# Patient Record
Sex: Male | Born: 1935
Health system: Southern US, Community
[De-identification: ages and names within clinical notes are randomized; demographics above are authoritative.]

## PROBLEM LIST (undated history)

## (undated) DIAGNOSIS — I480 Paroxysmal atrial fibrillation: Secondary | ICD-10-CM

## (undated) DIAGNOSIS — C801 Malignant (primary) neoplasm, unspecified: Secondary | ICD-10-CM

## (undated) DIAGNOSIS — I739 Peripheral vascular disease, unspecified: Secondary | ICD-10-CM

## (undated) DIAGNOSIS — I10 Essential (primary) hypertension: Secondary | ICD-10-CM

## (undated) DIAGNOSIS — R0602 Shortness of breath: Secondary | ICD-10-CM

## (undated) DIAGNOSIS — J189 Pneumonia, unspecified organism: Secondary | ICD-10-CM

## (undated) DIAGNOSIS — N200 Calculus of kidney: Secondary | ICD-10-CM

## (undated) DIAGNOSIS — C61 Malignant neoplasm of prostate: Secondary | ICD-10-CM

## (undated) DIAGNOSIS — Z978 Presence of other specified devices: Secondary | ICD-10-CM

## (undated) HISTORY — PX: JOINT REPLACEMENT: SHX530

## (undated) HISTORY — PX: COLONOSCOPY: SHX174

---

## 2001-04-25 ENCOUNTER — Emergency Department (HOSPITAL_COMMUNITY): Admission: EM | Admit: 2001-04-25 | Discharge: 2001-04-25 | Payer: Self-pay | Admitting: Emergency Medicine

## 2001-04-25 ENCOUNTER — Encounter: Payer: Self-pay | Admitting: Emergency Medicine

## 2001-06-23 ENCOUNTER — Other Ambulatory Visit: Admission: RE | Admit: 2001-06-23 | Discharge: 2001-06-23 | Payer: Self-pay | Admitting: Urology

## 2001-07-10 ENCOUNTER — Encounter: Payer: Self-pay | Admitting: Urology

## 2001-07-10 ENCOUNTER — Ambulatory Visit (HOSPITAL_COMMUNITY): Admission: RE | Admit: 2001-07-10 | Discharge: 2001-07-10 | Payer: Self-pay | Admitting: Urology

## 2001-07-12 ENCOUNTER — Encounter: Payer: Self-pay | Admitting: Urology

## 2001-07-12 ENCOUNTER — Ambulatory Visit (HOSPITAL_COMMUNITY): Admission: RE | Admit: 2001-07-12 | Discharge: 2001-07-12 | Payer: Self-pay | Admitting: Urology

## 2001-07-20 ENCOUNTER — Ambulatory Visit: Admission: RE | Admit: 2001-07-20 | Discharge: 2001-10-18 | Payer: Self-pay | Admitting: Radiation Oncology

## 2001-07-21 ENCOUNTER — Encounter: Payer: Self-pay | Admitting: Emergency Medicine

## 2001-07-22 ENCOUNTER — Inpatient Hospital Stay (HOSPITAL_COMMUNITY): Admission: EM | Admit: 2001-07-22 | Discharge: 2001-07-23 | Payer: Self-pay | Admitting: Emergency Medicine

## 2004-04-19 ENCOUNTER — Emergency Department (HOSPITAL_COMMUNITY): Admission: EM | Admit: 2004-04-19 | Discharge: 2004-04-19 | Payer: Self-pay | Admitting: Emergency Medicine

## 2005-02-11 ENCOUNTER — Ambulatory Visit: Payer: Self-pay | Admitting: Orthopedic Surgery

## 2005-02-16 ENCOUNTER — Encounter (HOSPITAL_COMMUNITY): Admission: RE | Admit: 2005-02-16 | Discharge: 2005-03-18 | Payer: Self-pay | Admitting: Orthopedic Surgery

## 2005-02-24 ENCOUNTER — Ambulatory Visit: Payer: Self-pay | Admitting: Orthopedic Surgery

## 2005-05-26 ENCOUNTER — Ambulatory Visit: Payer: Self-pay | Admitting: Orthopedic Surgery

## 2005-12-19 ENCOUNTER — Inpatient Hospital Stay (HOSPITAL_COMMUNITY): Admission: EM | Admit: 2005-12-19 | Discharge: 2005-12-19 | Payer: Self-pay | Admitting: Emergency Medicine

## 2005-12-21 ENCOUNTER — Ambulatory Visit (HOSPITAL_COMMUNITY): Admission: RE | Admit: 2005-12-21 | Discharge: 2005-12-21 | Payer: Self-pay | Admitting: Internal Medicine

## 2006-01-05 ENCOUNTER — Emergency Department (HOSPITAL_COMMUNITY): Admission: EM | Admit: 2006-01-05 | Discharge: 2006-01-05 | Payer: Self-pay | Admitting: Emergency Medicine

## 2007-01-26 ENCOUNTER — Ambulatory Visit (HOSPITAL_COMMUNITY): Admission: RE | Admit: 2007-01-26 | Discharge: 2007-01-26 | Payer: Self-pay | Admitting: Family Medicine

## 2007-09-11 ENCOUNTER — Ambulatory Visit (HOSPITAL_COMMUNITY): Admission: RE | Admit: 2007-09-11 | Discharge: 2007-09-11 | Payer: Self-pay | Admitting: Family Medicine

## 2010-01-16 ENCOUNTER — Ambulatory Visit (HOSPITAL_COMMUNITY): Admission: RE | Admit: 2010-01-16 | Discharge: 2010-01-16 | Payer: Self-pay | Admitting: Internal Medicine

## 2010-01-19 ENCOUNTER — Emergency Department (HOSPITAL_COMMUNITY)
Admission: EM | Admit: 2010-01-19 | Discharge: 2010-01-19 | Payer: Self-pay | Source: Home / Self Care | Admitting: Emergency Medicine

## 2010-07-01 LAB — URINE CULTURE

## 2010-07-01 LAB — URINALYSIS, ROUTINE W REFLEX MICROSCOPIC
Glucose, UA: NEGATIVE mg/dL
Nitrite: NEGATIVE
Specific Gravity, Urine: 1.025 (ref 1.005–1.030)
pH: 6 (ref 5.0–8.0)

## 2010-07-01 LAB — URINE MICROSCOPIC-ADD ON

## 2010-09-04 NOTE — Discharge Summary (Signed)
[facility]  Patient:    [name], [name] Visit Number: [id number] MRN: [id number]          Service Type: MED Location: 3A A319 01 Attending Physician:  Syliva Overman Dictated by:   Syliva Overman, M.D. Admit Date:  07/21/2001 Discharge Date: 07/23/2001                             Discharge Summary  DISCHARGE DIAGNOSES: 1. Acute urinary tract infection. 2. Hypertension. 3. Hyperlipidemia. 4. Prostate cancer.  SUMMARY:  The patient is an 77 year old African-American male who presented with a 2-day history of shaking chills and fever, accompanied by emesis. He also gave a 2-day history of dysuria, frequency, and malodorous urine.  HISTORY OF PRESENT ILLNESS:  The patient was diagnosed with prostate cancer approximately 3 weeks prior to his admission. He was referred to Dr. Dayton Scrape for radiation therapy, and he reported that three days prior to this admission, he had an test done at [facility] which involved introduction of dye per the urethra. He reports that the symptoms started shortly after this as stated above. He did call Dr Rito Ehrlich who recommended Levaquin by mouth over the telephone; however, the patient was unable to keep the Levaquin down secondary to emesis, and he presented to the emergency room with shaking chills and a fever of 102.5.  PAST MEDICAL HISTORY:  Positive for hypertension x3 years, hyperlipidemia x2 years, obesity, and symptoms of prostate enlargement.  SOCIAL HISTORY:  Status post right hip replacement in 1993.  MEDICATION ON ADMISSION: 1. Norvasc 5 mg daily. 2. Lipitor 20 mg at bedtime, which the patient takes intermittently. 3. Flomax 0.4 mg daily as needed.  ALLERGIES:  Stated as to PENICILLIN and probably CODEINE.  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 He is a father of two sons and one daughter, ages ranging from 2 to  64.  FAMILY HISTORY:  Parents:  Mother was deceased in her 63s, known to die asthmatic but exact cause of death not known; Father died in his 88s of unknown cause. He is an only child.  PHYSICAL EXAMINATION:  GENERAL:  At the time when I saw him, he was an elderly gentleman lying flat in bed in no obvious cardiopulmonary distress.  VITAL SIGNS:  His temperature at that time was 98.3 with a heart rate of 92, respirations 20, blood pressure 130/72.  HEENT:  Extraocular muscles intact. Oropharynx moist.  NECK:  Supple. No JVD, no bruits.  CHEST:  Adequate air entry bilaterally, no crackles or wheezes were heard.  CARDIOVASCULAR:  Heart sounds S1 & S2, no murmurs, no S3.  ABDOMEN:  Soft, nontender, no palpable organomegaly or masses, no renal angle tenderness.  EXTREMITIES:  Negative for edema or ulcers.  ADMISSION LABORATORY DATA:  Urinalysis showed nitrite positive cloudy urine with a small amount of leukocyte esterase, and many bacteria. Complete Blood Count:  White cell count elevated to 12.8 with 90% granulocytes, hemoglobin 13.6, platelets 175,000. Chemistries:  Sodium was 138, potassium 3.8, chloride 105, CO2 26, BUN 11, creatinine 1.1, glucose 129. Hepatic panel normal.  HOSPITAL COURSE:  The patient was admitted to the medical floor. His blood cultures x2 and urine cultures were sent from the emergency room. He also received his first dose of IV Levaquin in the emergency room. Subsequently, he has had two further doses of IV Levaquin,  the initial two 500 mg, the last 750 mg. The patients T-max during his hospitalization was 99.4. His appetite was good, and he had absolutely no episodes of emesis. His hospital course was uncomplicated. He made steady progress, and on the day of his discharge his white cell count is 7.8 with a hemoglobin of 12.4, platelet count 160, neutrophils 72% with 19% lymphocytes. The blood culture report for the initial 2 days is negative for any  signs of bacterial infection. This will be followed up closely in the next five days, and his urine culture is not yet available. He will be discharged home on a 1-week course of Levaquin 500 mg one to be taken daily as well as his other chronic medications which include Norvasc 5 mg daily, Lipitor 20 mg at bedtime, and Flomax 0.4 mg one daily as needed. His lipid panel was checked while in hospital, and this shows a total cholesterol of 192 with a bad cholesterol of 124, triglycerides 64, and HDL 53.  DISPOSITION:  The patients condition at discharge is stable.  FOLLOWUP:  He will follow up with Dr. Lodema Hong on July 27, 2001 at 10:30 a.m.  DISCHARGE MEDICATIONS: 1. Levaquin 500 mg daily for a week. 2. Norvasc 5 mg daily. 3. Lipitor 20 mg at bedtime. 4. Flomax 0.4 daily as needed. Dictated by:   Syliva Overman, M.D. Attending Physician:  Syliva Overman DD:  07/23/01 TD:  07/23/01 Job: [id number]

## 2010-09-04 NOTE — H&P (Signed)
[facility]  Patient:    CHICO, CAWOOD Visit Number: [id number] MRN: [id number]          Service Type: MED Location: 3A A213 01 Attending Physician:  Hilario Quarry Dictated by:   Syliva Overman, M.D. Admit Date:  07/21/2001                           History and Physical  HISTORY OF PRESENT ILLNESS:  In summary, Mr. Ritzel is a 77 year old African American male who presents with a two-day history of shaking chills and fever. The patient also reports a two-day history of malodorous urine with dysuria; this was one day following a study involving dye placement per his urethra. The patient was diagnosed with prostate cancer approximately three weeks ago and has been having repeated testing being done for radiation therapy. Dictated by:   Syliva Overman, M.D. Attending Physician:  Hilario Quarry DD:  07/22/01 TD:  07/22/01 Job: [id number]

## 2010-09-04 NOTE — Discharge Summary (Signed)
NAMEIVIN, ROSENBLOOM NO.:  [id number]   MEDICAL RECORD NO.:  [id number]          PATIENT TYPE:  INP   LOCATION:  A302                          FACILITY:  [facility]   PHYSICIAN:  Madelin Rear. Sherwood Gambler, MD  DATE OF BIRTH:  [birth year]   DATE OF ADMISSION:  12/19/2005  DATE OF DISCHARGE:  09/02/2007LH                                 DISCHARGE SUMMARY   DISCHARGE DIAGNOSES:  1. Constipation, resolved.  2. Hypertension, stable.   HOSPITAL COURSE AND PLAN:  The patient was admitted for about an hour or two  after attempts in the emergency department apparently were unsuccessful in  relieving his constipation.  However, on my morning rounds the patient  stated he was asymptomatic and had two good bowel movements.  His abdomen  was soft, nontender.  Outpatient CT scan to be arranged.  Over the counter  stool softener, Colace, as well as Lactulose was prescribed.      Madelin Rear. Sherwood Gambler, MD  Electronically Signed     LJF/MEDQ  D:  12/19/2005  T:  12/20/2005  Job:  [id number]

## 2010-09-04 NOTE — H&P (Signed)
[facility]  Patient:    [name], [name] Visit Number: [id number] MRN: [id number]          Service Type: MED Location: 3A Y782 01 Attending Physician:  Hilario Quarry Dictated by:   Syliva Overman, M.D. Admit Date:  07/21/2001                           History and Physical  CHIEF COMPLAINT:  Mr. Delucia is a 77 year old African American male who presents with a two-day history of shaking chills and fever accompanied by emesis.  The patient also gives a two-day history of dysuria, frequency and malodorous urine.  HISTORY OF THE PRESENT ILLNESS:  Mr. Borgwardt was diagnosed with prostate cancer approximately three weeks prior to this admission.  He was referred for radiation treatment and has been subsequently seen by Dr. Maryln Gottron in [place].  He reports that three days prior to his admission, he had a test done at [facility] which involved introduction of dye per the urethra; following this, his symptoms, as stated above, developed.  The patient did call Dr. Dennie Maizes, reporting shaking chills as well as fever and he had been started on oral Levaquin.  He reports, however, that he did not keep the antibiotic down.  He subsequently had significant emesis and then he presented to the emergency room several hours later, febrile and with clear evidence of a urinary tract infection.  At the time he presented to the emergency room, his temperature was 102.5.  PAST MEDICAL HISTORY:  Significant for hypertension for about three years, hyperlipidemia for approximately two years, obesity and symptoms of prostate enlargement.  SURGICAL HISTORY:  He is status post right hip replacement in 1993.  MEDICATIONS: 1. Norvasc 5 mg daily. 2. Lipitor 20 mg at bedtime, which the patient reports he does not take as he    should, since he thought it was harming him. 3. Flomax 0.4 mg daily as needed.  ALLERGIES:  Allergies stated are to PENICILLIN and  PROBABLY CODEINE.  SOCIAL HISTORY:  He has been married for 52 years.  He is the father of two sons and a daughter, ages ranging from 10 to 11.  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   FAMILY HISTORY:  Parents:  Mother was deceased in her 29s; she was known to be asthmatic but her exact cause of death is not clear.  Father also died in his 52s, unknown cause.  He is an only child.  REVIEW OF SYSTEMS:  CARDIOVASCULAR SYSTEM:  The patient denies any chest pain, palpitations and becomes dyspneic only with excessive exertion.  RESPIRATORY SYSTEM:  He denies any sinus congestion, productive cough, sore throat or significant allergy symptoms.  GASTROINTESTINAL SYSTEM:  Appetite is reportedly good.  His bowel movement are regular.  There is no obvious rectal bleeding, black stool or change in bowel habits.  He states he had a colonoscopy approximately five years ago.  CNS:  He denies headaches or seizure activity or any localizing weakness.  PHYSICAL EXAMINATION:  GENERAL:  At the time I saw him on the floor, the patient was an elderly gentleman lying flat in bed in no obvious cardiopulmonary distress.  VITAL SIGNS:  His temperature was 98.3, pulse 92, respirations 20, blood pressure 130/72.  HEENT/NECK:  Neck supple.  Extraocular muscles intact.  Oropharynx moist.  No JVD.  No bruits.  CHEST:  Exam revealed adequate air  entry bilaterally, no crackles or wheezes heard.  CARDIOVASCULAR:  Heart sounds 1 and 2.  No murmurs or S3.  ABDOMEN:  Obese, soft, nontender.  No palpable organomegaly or masses.  EXTREMITIES:  Exam negative for edema or ulcers.  LABORATORY AND ACCESSORY DATA:  Urinalysis:  Clean-catch urine was cloudy, which was nitrite-positive.  Small amount of leukocyte esterase and many bacteria were seen.  CBC:  White cell count was elevated at 12.8 with 90% granulocytes.  Hemoglobin was 13.6 and platelets 175,000.  Chemistries: Sodium 138, potassium 3.8,  chloride 105, CO2 26, BUN 11, creatinine 1.1 and glucose 129.  Hepatic panel was within normal.  IMPRESSION: 1. The patient has acute urinary tract infection with question of sepsis    immediately following an per-urethral procedure.  He will be admitted,    placed on intravenous antibiotic -- Levaquin is antibiotic of choice in    this case -- and urine and blood cultures x2 have been sent off.  Tylenol    will be used for control of any fever and he will have a low-sodium diet as    tolerated. 2. Hypertension, adequately controlled on current medication.  Continue    Norvasc 5 mg daily. 3. Hyperlipidemia.  Lipitor 20 mg at bedtime.  Fasting lipid panel to be    checked in the morning. 4. Dr. Rito Ehrlich notified of admission and will see the patient also.Dictated by:   Syliva Overman, M.D. Attending Physician:  Hilario Quarry DD:  07/22/01 TD:  07/22/01 Job: [id number]

## 2010-09-04 NOTE — H&P (Signed)
[name], [name] NO.:  [id number]   MEDICAL RECORD NO.:  [id number]          PATIENT TYPE:  INP   LOCATION:  A302                          FACILITY:  [facility]   PHYSICIAN:  Madelin Rear. Sherwood Gambler, MD  DATE OF BIRTH:  1928/06/01   DATE OF ADMISSION:  12/19/2005  DATE OF DISCHARGE:  09/02/2007LH                                HISTORY & PHYSICAL   CHIEF COMPLAINT:  Constipation.   HISTORY OF PRESENT ILLNESS:  The patient had several days of lack of bowel  movement and abdominal discomfort and cramping.  Self-administer over-the-  counter laxatives were ineffective at home according to the patient.   PAST MEDICAL HISTORY:  1. Prostate CA managed by urology.  2. Hypertension maintained on hydrochlorothiazide.   SOCIAL HISTORY:  Nonsmoker, nondrinker.  No other drug use.   FAMILY HISTORY:  Noncontributory.   REVIEW OF SYSTEMS:  Under HPI.  No hematemesis, hematochezia or melena.   PHYSICAL EXAMINATION:  SKIN:  Unremarkable.  HEAD AND NECK:  No JVD or adenopathy.  Neck supple.  CHEST:  Clear.  CARDIAC EXAM:  Regular rhythm.  No gallop or rub.  ABDOMEN:  Soft.  No organomegaly or masses.  No guarding or rebound  tenderness.  No tympanitic percussion.   LABORATORIES:  Not obtained by ER.   IMPRESSION:  Constipation unresponsive to enema in emergency department and  over-the-counter oral laxatives.  The patient was admitted however after  Dulcolax and a second enema, he had a good bowel movement and felt back to  normal in the morning.  A separate discharge summary will be dictated  however, the patient was discharged on Colace 100 mg p.o. t.i.d., lactulose  p.r.n. and follow up in our office, outpatient CT scan to make sure he has  no partial bowel obstructions.     Madelin Rear. Sherwood Gambler, MD  Electronically Signed    LJF/MEDQ  D:  12/19/2005  T:  12/19/2005  Job:  [id number]

## 2010-12-09 ENCOUNTER — Ambulatory Visit (HOSPITAL_COMMUNITY)
Admission: RE | Admit: 2010-12-09 | Discharge: 2010-12-09 | Disposition: A | Discharge status: Home / Self Care | Payer: Medicare Other | Source: Ambulatory Visit | Attending: Family Medicine | Admitting: Family Medicine

## 2010-12-09 ENCOUNTER — Other Ambulatory Visit (HOSPITAL_COMMUNITY): Payer: Self-pay | Admitting: Family Medicine

## 2010-12-09 DIAGNOSIS — R55 Syncope and collapse: Secondary | ICD-10-CM

## 2010-12-09 DIAGNOSIS — Z8546 Personal history of malignant neoplasm of prostate: Secondary | ICD-10-CM | POA: Insufficient documentation

## 2010-12-14 ENCOUNTER — Ambulatory Visit (HOSPITAL_COMMUNITY)
Admission: RE | Admit: 2010-12-14 | Discharge: 2010-12-14 | Disposition: A | Discharge status: Home / Self Care | Payer: Medicare Other | Source: Ambulatory Visit | Attending: Family Medicine | Admitting: Family Medicine

## 2010-12-14 DIAGNOSIS — I658 Occlusion and stenosis of other precerebral arteries: Secondary | ICD-10-CM | POA: Insufficient documentation

## 2010-12-14 DIAGNOSIS — R55 Syncope and collapse: Secondary | ICD-10-CM | POA: Insufficient documentation

## 2010-12-14 DIAGNOSIS — I6529 Occlusion and stenosis of unspecified carotid artery: Secondary | ICD-10-CM | POA: Insufficient documentation

## 2011-05-03 DIAGNOSIS — H4011X0 Primary open-angle glaucoma, stage unspecified: Secondary | ICD-10-CM | POA: Diagnosis not present

## 2011-05-03 DIAGNOSIS — H2510 Age-related nuclear cataract, unspecified eye: Secondary | ICD-10-CM | POA: Diagnosis not present

## 2011-05-20 DIAGNOSIS — R05 Cough: Secondary | ICD-10-CM | POA: Diagnosis not present

## 2011-05-20 DIAGNOSIS — D400 Neoplasm of uncertain behavior of prostate: Secondary | ICD-10-CM | POA: Diagnosis not present

## 2011-05-20 DIAGNOSIS — J069 Acute upper respiratory infection, unspecified: Secondary | ICD-10-CM | POA: Diagnosis not present

## 2011-05-20 DIAGNOSIS — I10 Essential (primary) hypertension: Secondary | ICD-10-CM | POA: Diagnosis not present

## 2011-05-20 DIAGNOSIS — Z6838 Body mass index (BMI) 38.0-38.9, adult: Secondary | ICD-10-CM | POA: Diagnosis not present

## 2011-06-01 DIAGNOSIS — C61 Malignant neoplasm of prostate: Secondary | ICD-10-CM | POA: Diagnosis not present

## 2011-06-15 DIAGNOSIS — H4011X0 Primary open-angle glaucoma, stage unspecified: Secondary | ICD-10-CM | POA: Diagnosis not present

## 2011-06-28 DIAGNOSIS — H4011X0 Primary open-angle glaucoma, stage unspecified: Secondary | ICD-10-CM | POA: Diagnosis not present

## 2011-06-28 DIAGNOSIS — H2510 Age-related nuclear cataract, unspecified eye: Secondary | ICD-10-CM | POA: Diagnosis not present

## 2011-08-26 DIAGNOSIS — C61 Malignant neoplasm of prostate: Secondary | ICD-10-CM | POA: Diagnosis not present

## 2011-09-02 DIAGNOSIS — C61 Malignant neoplasm of prostate: Secondary | ICD-10-CM | POA: Diagnosis not present

## 2011-11-01 DIAGNOSIS — H4011X0 Primary open-angle glaucoma, stage unspecified: Secondary | ICD-10-CM | POA: Diagnosis not present

## 2011-11-01 DIAGNOSIS — H409 Unspecified glaucoma: Secondary | ICD-10-CM | POA: Diagnosis not present

## 2011-11-01 DIAGNOSIS — H2510 Age-related nuclear cataract, unspecified eye: Secondary | ICD-10-CM | POA: Diagnosis not present

## 2011-12-15 DIAGNOSIS — C61 Malignant neoplasm of prostate: Secondary | ICD-10-CM | POA: Diagnosis not present

## 2012-01-21 DIAGNOSIS — I10 Essential (primary) hypertension: Secondary | ICD-10-CM | POA: Diagnosis not present

## 2012-01-21 DIAGNOSIS — N390 Urinary tract infection, site not specified: Secondary | ICD-10-CM | POA: Diagnosis not present

## 2012-01-21 DIAGNOSIS — Z23 Encounter for immunization: Secondary | ICD-10-CM | POA: Diagnosis not present

## 2012-01-21 DIAGNOSIS — Z0000 Encounter for general adult medical examination without abnormal findings: Secondary | ICD-10-CM | POA: Diagnosis not present

## 2012-03-27 DIAGNOSIS — H2510 Age-related nuclear cataract, unspecified eye: Secondary | ICD-10-CM | POA: Diagnosis not present

## 2012-03-27 DIAGNOSIS — H4011X0 Primary open-angle glaucoma, stage unspecified: Secondary | ICD-10-CM | POA: Diagnosis not present

## 2012-07-31 DIAGNOSIS — N390 Urinary tract infection, site not specified: Secondary | ICD-10-CM | POA: Diagnosis not present

## 2012-07-31 DIAGNOSIS — C61 Malignant neoplasm of prostate: Secondary | ICD-10-CM | POA: Diagnosis not present

## 2012-10-16 DIAGNOSIS — H4011X0 Primary open-angle glaucoma, stage unspecified: Secondary | ICD-10-CM | POA: Diagnosis not present

## 2012-10-16 DIAGNOSIS — H2510 Age-related nuclear cataract, unspecified eye: Secondary | ICD-10-CM | POA: Diagnosis not present

## 2012-10-16 DIAGNOSIS — H409 Unspecified glaucoma: Secondary | ICD-10-CM | POA: Diagnosis not present

## 2012-11-08 DIAGNOSIS — R972 Elevated prostate specific antigen [PSA]: Secondary | ICD-10-CM | POA: Diagnosis not present

## 2012-11-08 DIAGNOSIS — C61 Malignant neoplasm of prostate: Secondary | ICD-10-CM | POA: Diagnosis not present

## 2013-01-09 DIAGNOSIS — H2510 Age-related nuclear cataract, unspecified eye: Secondary | ICD-10-CM | POA: Diagnosis not present

## 2013-01-09 DIAGNOSIS — H4011X0 Primary open-angle glaucoma, stage unspecified: Secondary | ICD-10-CM | POA: Diagnosis not present

## 2013-01-09 DIAGNOSIS — H409 Unspecified glaucoma: Secondary | ICD-10-CM | POA: Diagnosis not present

## 2013-01-12 DIAGNOSIS — Z681 Body mass index (BMI) 19 or less, adult: Secondary | ICD-10-CM | POA: Diagnosis not present

## 2013-01-12 DIAGNOSIS — N390 Urinary tract infection, site not specified: Secondary | ICD-10-CM | POA: Diagnosis not present

## 2013-01-12 DIAGNOSIS — Z23 Encounter for immunization: Secondary | ICD-10-CM | POA: Diagnosis not present

## 2013-01-12 DIAGNOSIS — R3919 Other difficulties with micturition: Secondary | ICD-10-CM | POA: Diagnosis not present

## 2013-01-29 ENCOUNTER — Encounter (HOSPITAL_COMMUNITY): Payer: Self-pay | Admitting: Pharmacy Technician

## 2013-02-06 NOTE — Patient Instructions (Addendum)
Your procedure is scheduled on: 02/13/2013  Report to [facility] at 0830 AM.  Call this number if you have problems the morning of surgery: [phone]   Do not eat food or drink liquids :After Midnight.      Take these medicines the morning of surgery with A SIP OF WATER: lotrel, hydrodiuril, flomax   Do not wear jewelry, make-up or nail polish.  Do not wear lotions, powders, or perfumes.   Do not shave 48 hours prior to surgery.  Do not bring valuables to the hospital.  Contacts, dentures or bridgework may not be worn into surgery.  Leave suitcase in the car. After surgery it may be brought to your room.  For patients admitted to the hospital, checkout time is 11:00 AM the day of discharge.   Patients discharged the day of surgery will not be allowed to drive home.  :     Please read over the following fact sheets that you were given: Coughing and Deep Breathing, Surgical Site Infection Prevention, Anesthesia Post-op Instructions and Care and Recovery After Surgery    Cataract A cataract is a clouding of the lens of the eye. When a lens becomes cloudy, vision is reduced based on the degree and nature of the clouding. Many cataracts reduce vision to some degree. Some cataracts make people more near-sighted as they develop. Other cataracts increase glare. Cataracts that are ignored and become worse can sometimes look white. The white color can be seen through the pupil. CAUSES   Aging. However, cataracts may occur at any age, even in newborns.   Certain drugs.   Trauma to the eye.   Certain diseases such as diabetes.   Specific eye diseases such as chronic inflammation inside the eye or a sudden attack of a rare form of glaucoma.   Inherited or acquired medical problems.  SYMPTOMS   Gradual, progressive drop in vision in the affected eye.   Severe, rapid visual loss. This most often happens when trauma is the cause.  DIAGNOSIS  To detect a cataract, an eye doctor examines  the lens. Cataracts are best diagnosed with an exam of the eyes with the pupils enlarged (dilated) by drops.  TREATMENT  For an early cataract, vision may improve by using different eyeglasses or stronger lighting. If that does not help your vision, surgery is the only effective treatment. A cataract needs to be surgically removed when vision loss interferes with your everyday activities, such as driving, reading, or watching TV. A cataract may also have to be removed if it prevents examination or treatment of another eye problem. Surgery removes the cloudy lens and usually replaces it with a substitute lens (intraocular lens, IOL).  At a time when both you and your doctor agree, the cataract will be surgically removed. If you have cataracts in both eyes, only one is usually removed at a time. This allows the operated eye to heal and be out of danger from any possible problems after surgery (such as infection or poor wound healing). In rare cases, a cataract may be doing damage to your eye. In these cases, your caregiver may advise surgical removal right away. The vast majority of people who have cataract surgery have better vision afterward. HOME CARE INSTRUCTIONS  If you are not planning surgery, you may be asked to do the following:  Use different eyeglasses.   Use stronger or brighter lighting.   Ask your eye doctor about reducing your medicine dose or changing medicines if  it is thought that a medicine caused your cataract. Changing medicines does not make the cataract go away on its own.   Become familiar with your surroundings. Poor vision can lead to injury. Avoid bumping into things on the affected side. You are at a higher risk for tripping or falling.   Exercise extreme care when driving or operating machinery.   Wear sunglasses if you are sensitive to bright light or experiencing problems with glare.  SEEK IMMEDIATE MEDICAL CARE IF:   You have a worsening or sudden vision loss.    You notice redness, swelling, or increasing pain in the eye.   You have a fever.  Document Released: 04/05/2005 Document Revised: 03/25/2011 Document Reviewed: 11/27/2010 [facility] Patient Information 2012 [place], [place].PATIENT INSTRUCTIONS POST-ANESTHESIA  IMMEDIATELY FOLLOWING SURGERY:  Do not drive or operate machinery for the first twenty four hours after surgery.  Do not make any important decisions for twenty four hours after surgery or while taking narcotic pain medications or sedatives.  If you develop intractable nausea and vomiting or a severe headache please notify your doctor immediately.  FOLLOW-UP:  Please make an appointment with your surgeon as instructed. You do not need to follow up with anesthesia unless specifically instructed to do so.  WOUND CARE INSTRUCTIONS (if applicable):  Keep a dry clean dressing on the anesthesia/puncture wound site if there is drainage.  Once the wound has quit draining you may leave it open to air.  Generally you should leave the bandage intact for twenty four hours unless there is drainage.  If the epidural site drains for more than 36-48 hours please call the anesthesia department.  QUESTIONS?:  Please feel free to call your physician or the hospital operator if you have any questions, and they will be happy to assist you.

## 2013-02-07 ENCOUNTER — Other Ambulatory Visit: Payer: Self-pay

## 2013-02-07 ENCOUNTER — Encounter (HOSPITAL_COMMUNITY): Payer: Self-pay

## 2013-02-07 ENCOUNTER — Encounter (HOSPITAL_COMMUNITY): Payer: Self-pay | Admitting: Pharmacy Technician

## 2013-02-07 ENCOUNTER — Encounter (HOSPITAL_COMMUNITY)
Admission: RE | Admit: 2013-02-07 | Discharge: 2013-02-07 | Disposition: A | Discharge status: Home / Self Care | Payer: Medicare Other | Source: Ambulatory Visit | Attending: Ophthalmology | Admitting: Ophthalmology

## 2013-02-07 DIAGNOSIS — Z01818 Encounter for other preprocedural examination: Secondary | ICD-10-CM | POA: Diagnosis not present

## 2013-02-07 DIAGNOSIS — Z01810 Encounter for preprocedural cardiovascular examination: Secondary | ICD-10-CM | POA: Insufficient documentation

## 2013-02-07 DIAGNOSIS — Z01812 Encounter for preprocedural laboratory examination: Secondary | ICD-10-CM | POA: Diagnosis not present

## 2013-02-07 HISTORY — DX: Pneumonia, unspecified organism: J18.9

## 2013-02-07 HISTORY — DX: Shortness of breath: R06.02

## 2013-02-07 HISTORY — DX: Calculus of kidney: N20.0

## 2013-02-07 HISTORY — DX: Essential (primary) hypertension: I10

## 2013-02-07 LAB — HEMOGLOBIN AND HEMATOCRIT, BLOOD
HCT: 40.3 % (ref 39.0–52.0)
Hemoglobin: 13.2 g/dL (ref 13.0–17.0)

## 2013-02-07 LAB — BASIC METABOLIC PANEL
BUN: 16 mg/dL (ref 6–23)
Chloride: 100 mEq/L (ref 96–112)
Creatinine, Ser: 0.9 mg/dL (ref 0.50–1.35)
GFR calc Af Amer: 89 mL/min — ABNORMAL LOW (ref >90)
GFR calc non Af Amer: 77 mL/min — ABNORMAL LOW (ref >90)
Potassium: 4 mEq/L (ref 3.5–5.1)

## 2013-02-12 ENCOUNTER — Encounter (HOSPITAL_COMMUNITY): Payer: Self-pay | Admitting: Pharmacy Technician

## 2013-02-12 MED ORDER — TETRACAINE HCL 0.5 % OP SOLN
OPHTHALMIC | Status: AC
  Filled 2013-02-12: qty 2

## 2013-02-12 MED ORDER — PHENYLEPHRINE HCL 2.5 % OP SOLN
OPHTHALMIC | Status: AC
  Filled 2013-02-12: qty 15

## 2013-02-12 MED ORDER — KETOROLAC TROMETHAMINE 0.5 % OP SOLN
OPHTHALMIC | Status: AC
  Filled 2013-02-12: qty 5

## 2013-02-12 MED ORDER — CYCLOPENTOLATE-PHENYLEPHRINE OP SOLN OPTIME - NO CHARGE
OPHTHALMIC | Status: AC
  Filled 2013-02-12: qty 2

## 2013-02-13 ENCOUNTER — Encounter (HOSPITAL_COMMUNITY): Payer: Self-pay | Admitting: Anesthesiology

## 2013-02-13 ENCOUNTER — Encounter (HOSPITAL_COMMUNITY)
Admission: RE | Disposition: A | Discharge status: Home / Self Care | Payer: Self-pay | Source: Ambulatory Visit | Attending: Ophthalmology

## 2013-02-13 ENCOUNTER — Encounter (HOSPITAL_COMMUNITY): Payer: Medicare Other | Admitting: Anesthesiology

## 2013-02-13 ENCOUNTER — Ambulatory Visit (HOSPITAL_COMMUNITY): Payer: Medicare Other | Admitting: Anesthesiology

## 2013-02-13 ENCOUNTER — Ambulatory Visit (HOSPITAL_COMMUNITY)
Admission: RE | Admit: 2013-02-13 | Discharge: 2013-02-13 | Disposition: A | Discharge status: Home / Self Care | Payer: Medicare Other | Source: Ambulatory Visit | Attending: Ophthalmology | Admitting: Ophthalmology

## 2013-02-13 DIAGNOSIS — H2510 Age-related nuclear cataract, unspecified eye: Secondary | ICD-10-CM | POA: Diagnosis not present

## 2013-02-13 DIAGNOSIS — H269 Unspecified cataract: Secondary | ICD-10-CM | POA: Diagnosis not present

## 2013-02-13 DIAGNOSIS — I10 Essential (primary) hypertension: Secondary | ICD-10-CM | POA: Insufficient documentation

## 2013-02-13 HISTORY — PX: CATARACT EXTRACTION W/PHACO: SHX586

## 2013-02-13 SURGERY — PHACOEMULSIFICATION, CATARACT, WITH IOL INSERTION
Anesthesia: Monitor Anesthesia Care | Site: Eye | Laterality: Right | Wound class: Clean

## 2013-02-13 MED ORDER — BSS IO SOLN
INTRAOCULAR | Status: DC | PRN
  Administered 2013-02-13: 15 mL via INTRAOCULAR

## 2013-02-13 MED ORDER — LIDOCAINE HCL (PF) 1 % IJ SOLN
INTRAMUSCULAR | Status: AC
  Filled 2013-02-13: qty 2

## 2013-02-13 MED ORDER — LIDOCAINE HCL (PF) 1 % IJ SOLN
INTRAMUSCULAR | Status: DC | PRN
  Administered 2013-02-13: 1 mL

## 2013-02-13 MED ORDER — MIDAZOLAM HCL 2 MG/2ML IJ SOLN
1.0000 mg | INTRAMUSCULAR | Status: DC | PRN
  Administered 2013-02-13: 2 mg via INTRAVENOUS

## 2013-02-13 MED ORDER — PROVISC 10 MG/ML IO SOLN
INTRAOCULAR | Status: DC | PRN
  Administered 2013-02-13: 8.5 mg via INTRAOCULAR

## 2013-02-13 MED ORDER — MIDAZOLAM HCL 2 MG/2ML IJ SOLN
INTRAMUSCULAR | Status: AC
  Filled 2013-02-13: qty 2

## 2013-02-13 MED ORDER — EPINEPHRINE HCL 1 MG/ML IJ SOLN
INTRAOCULAR | Status: DC | PRN
  Administered 2013-02-13: 10:00:00

## 2013-02-13 MED ORDER — EPINEPHRINE HCL 1 MG/ML IJ SOLN
INTRAMUSCULAR | Status: AC
  Filled 2013-02-13: qty 1

## 2013-02-13 MED ORDER — PHENYLEPHRINE HCL 2.5 % OP SOLN
1.0000 [drp] | OPHTHALMIC | Status: AC
  Administered 2013-02-13 (×3): 1 [drp] via OPHTHALMIC

## 2013-02-13 MED ORDER — CYCLOPENTOLATE-PHENYLEPHRINE 0.2-1 % OP SOLN
1.0000 [drp] | OPHTHALMIC | Status: AC
Start: 2013-02-13 — End: 2013-02-13
  Administered 2013-02-13 (×3): 1 [drp] via OPHTHALMIC

## 2013-02-13 MED ORDER — KETOROLAC TROMETHAMINE 0.5 % OP SOLN
1.0000 [drp] | OPHTHALMIC | Status: AC
  Administered 2013-02-13 (×3): 1 [drp] via OPHTHALMIC

## 2013-02-13 MED ORDER — LACTATED RINGERS IV SOLN
INTRAVENOUS | Status: DC
  Administered 2013-02-13: 09:00:00 via INTRAVENOUS

## 2013-02-13 MED ORDER — TETRACAINE HCL 0.5 % OP SOLN
1.0000 [drp] | OPHTHALMIC | Status: AC
  Administered 2013-02-13 (×3): 1 [drp] via OPHTHALMIC

## 2013-02-13 SURGICAL SUPPLY — 10 items
CLOTH BEACON ORANGE TIMEOUT ST (SAFETY) ×1 IMPLANT
EYE SHIELD UNIVERSAL CLEAR (GAUZE/BANDAGES/DRESSINGS) ×1 IMPLANT
GLOVE BIO SURGEON STRL SZ 6.5 (GLOVE) ×1 IMPLANT
GLOVE EXAM NITRILE LRG STRL (GLOVE) ×1 IMPLANT
PAD ARMBOARD 7.5X6 YLW CONV (MISCELLANEOUS) ×1 IMPLANT
RING MALYGIN (MISCELLANEOUS) ×1 IMPLANT
SIGHTPATH CAT PROC W REG LENS (Ophthalmic Related) ×2 IMPLANT
TAPE SURG TRANSPORE 1 IN (GAUZE/BANDAGES/DRESSINGS) IMPLANT
TAPE SURGICAL TRANSPORE 1 IN (GAUZE/BANDAGES/DRESSINGS) ×1
WATER STERILE IRR 250ML POUR (IV SOLUTION) ×1 IMPLANT

## 2013-02-13 NOTE — Transfer of Care (Signed)
Immediate Anesthesia Transfer of Care Note  Patient: [name]  Procedure(s) Performed: Procedure(s): CATARACT EXTRACTION RIGHT EYE (WITH PHACO) AND INTRAOCULAR LENS PLACEMENT  CDE=14.53 (Right)  Patient Location: Short Stay  Anesthesia Type:MAC  Level of Consciousness: awake  Airway & Oxygen Therapy: Patient Spontanous Breathing  Post-op Assessment: Report given to PACU RN  Post vital signs: Reviewed  Complications: No apparent anesthesia complications

## 2013-02-13 NOTE — Op Note (Signed)
Patient brought to the operating room and prepped and draped in the usual manner.  Lid speculum inserted in right eye.  Stab incision made at the twelve o'clock position.  Provisc instilled in the anterior chamber.   A 2.4 mm. Stab incision was made temporally. Intraocular Xylocaine was instilled.  Due to a small pupil, a Malugyn Ring was inserted.  An anterior capsulotomy was done with a bent 25 gauge needle.  The nucleus was hydrodissected.  The Phaco tip was inserted in the anterior chamber and the nucleus was emulsified.  CDE was 14.53.  The cortical material was then removed with the I and A tip.  Posterior capsule was the polished.  The anterior chamber was deepened with Provisc.  A 22.0 Diopter Rayner 570C IOL was then inserted in the capsular bag. The Malugyn Ring was removed.   Provisc was then removed with the I and A tip.  The wound was then hydrated.  Patient sent to the Recovery Room in good condition with follow up in my office.  Preoperative Diagnosis:  Nuclear Cataract OD Postoperative Diagnosis:  Same Procedure name: Kelman Phacoemulsification OD with IOL

## 2013-02-13 NOTE — H&P (Signed)
The patient was re examined and there is no change in the patients condition since the original H and P. 

## 2013-02-13 NOTE — Anesthesia Postprocedure Evaluation (Signed)
  Anesthesia Post-op Note  Patient: [name]  Procedure(s) Performed: Procedure(s): CATARACT EXTRACTION RIGHT EYE (WITH PHACO) AND INTRAOCULAR LENS PLACEMENT  CDE=14.53 (Right)  Patient Location: Short Stay  Anesthesia Type:MAC  Level of Consciousness: awake, alert  and oriented  Airway and Oxygen Therapy: Patient Spontanous Breathing  Post-op Pain: none  Post-op Assessment: Post-op Vital signs reviewed, Patient's Cardiovascular Status Stable, Respiratory Function Stable, Patent Airway and No signs of Nausea or vomiting  Post-op Vital Signs: Reviewed and stable  Complications: No apparent anesthesia complications

## 2013-02-13 NOTE — Anesthesia Preprocedure Evaluation (Signed)
Anesthesia Evaluation  Patient identified by MRN, date of birth, ID band Patient awake    Reviewed: Allergy & Precautions, H&P , NPO status , Patient's Chart, lab work & pertinent test results  Airway Mallampati: II TM Distance: >3 FB     Dental  (+) Teeth Intact   Pulmonary shortness of breath and with exertion, pneumonia -, resolved,  breath sounds clear to auscultation        Cardiovascular hypertension, Pt. on medications Rhythm:Regular Rate:Normal     Neuro/Psych    GI/Hepatic negative GI ROS,   Endo/Other    Renal/GU      Musculoskeletal   Abdominal   Peds  Hematology   Anesthesia Other Findings   Reproductive/Obstetrics                           Anesthesia Physical Anesthesia Plan  ASA: II  Anesthesia Plan: MAC   Post-op Pain Management:    Induction: Intravenous  Airway Management Planned: Nasal Cannula  Additional Equipment:   Intra-op Plan:   Post-operative Plan:   Informed Consent: I have reviewed the patients History and Physical, chart, labs and discussed the procedure including the risks, benefits and alternatives for the proposed anesthesia with the patient or authorized representative who has indicated his/her understanding and acceptance.     Plan Discussed with:   Anesthesia Plan Comments:         Anesthesia Quick Evaluation

## 2013-02-14 ENCOUNTER — Encounter (HOSPITAL_COMMUNITY): Payer: Self-pay | Admitting: Ophthalmology

## 2013-02-20 ENCOUNTER — Encounter (HOSPITAL_COMMUNITY)
Admission: RE | Admit: 2013-02-20 | Discharge: 2013-02-20 | Disposition: A | Discharge status: Home / Self Care | Payer: Medicare Other | Source: Ambulatory Visit | Attending: Ophthalmology | Admitting: Ophthalmology

## 2013-02-20 ENCOUNTER — Encounter (HOSPITAL_COMMUNITY): Payer: Self-pay

## 2013-02-22 DIAGNOSIS — C61 Malignant neoplasm of prostate: Secondary | ICD-10-CM | POA: Diagnosis not present

## 2013-02-26 MED ORDER — CYCLOPENTOLATE-PHENYLEPHRINE OP SOLN OPTIME - NO CHARGE
OPHTHALMIC | Status: AC
  Filled 2013-02-26: qty 2

## 2013-02-26 MED ORDER — KETOROLAC TROMETHAMINE 0.5 % OP SOLN
OPHTHALMIC | Status: AC
  Filled 2013-02-26: qty 5

## 2013-02-26 MED ORDER — TETRACAINE HCL 0.5 % OP SOLN
OPHTHALMIC | Status: AC
  Filled 2013-02-26: qty 2

## 2013-02-27 ENCOUNTER — Encounter (HOSPITAL_COMMUNITY): Payer: Self-pay | Admitting: *Deleted

## 2013-02-27 ENCOUNTER — Ambulatory Visit (HOSPITAL_COMMUNITY)
Admission: RE | Admit: 2013-02-27 | Discharge: 2013-02-27 | Disposition: A | Discharge status: Home / Self Care | Payer: Medicare Other | Source: Ambulatory Visit | Attending: Ophthalmology | Admitting: Ophthalmology

## 2013-02-27 ENCOUNTER — Ambulatory Visit (HOSPITAL_COMMUNITY): Payer: Medicare Other | Admitting: Anesthesiology

## 2013-02-27 ENCOUNTER — Encounter (HOSPITAL_COMMUNITY): Payer: Medicare Other | Admitting: Anesthesiology

## 2013-02-27 ENCOUNTER — Encounter (HOSPITAL_COMMUNITY)
Admission: RE | Disposition: A | Discharge status: Home / Self Care | Payer: Self-pay | Source: Ambulatory Visit | Attending: Ophthalmology

## 2013-02-27 DIAGNOSIS — H269 Unspecified cataract: Secondary | ICD-10-CM | POA: Diagnosis not present

## 2013-02-27 DIAGNOSIS — I10 Essential (primary) hypertension: Secondary | ICD-10-CM | POA: Diagnosis not present

## 2013-02-27 DIAGNOSIS — H2510 Age-related nuclear cataract, unspecified eye: Secondary | ICD-10-CM | POA: Insufficient documentation

## 2013-02-27 HISTORY — PX: CATARACT EXTRACTION W/PHACO: SHX586

## 2013-02-27 SURGERY — PHACOEMULSIFICATION, CATARACT, WITH IOL INSERTION
Anesthesia: Monitor Anesthesia Care | Site: Eye | Laterality: Left | Wound class: Clean

## 2013-02-27 MED ORDER — BSS IO SOLN
INTRAOCULAR | Status: DC | PRN
  Administered 2013-02-27: 15 mL via INTRAOCULAR

## 2013-02-27 MED ORDER — FENTANYL CITRATE 0.05 MG/ML IJ SOLN
25.0000 ug | INTRAMUSCULAR | Status: AC
  Administered 2013-02-27: 25 ug via INTRAVENOUS
  Filled 2013-02-27: qty 2

## 2013-02-27 MED ORDER — PHENYLEPHRINE HCL 2.5 % OP SOLN
OPHTHALMIC | Status: AC
  Filled 2013-02-27: qty 15

## 2013-02-27 MED ORDER — KETOROLAC TROMETHAMINE 0.5 % OP SOLN
1.0000 [drp] | OPHTHALMIC | Status: AC
  Administered 2013-02-27 (×3): 1 [drp] via OPHTHALMIC

## 2013-02-27 MED ORDER — PROVISC 10 MG/ML IO SOLN
INTRAOCULAR | Status: DC | PRN
  Administered 2013-02-27: 0.85 mL via INTRAOCULAR

## 2013-02-27 MED ORDER — PHENYLEPHRINE HCL 2.5 % OP SOLN
1.0000 [drp] | OPHTHALMIC | Status: AC
  Administered 2013-02-27 (×3): 1 [drp] via OPHTHALMIC

## 2013-02-27 MED ORDER — EPINEPHRINE HCL 1 MG/ML IJ SOLN
INTRAMUSCULAR | Status: AC
  Filled 2013-02-27: qty 1

## 2013-02-27 MED ORDER — CYCLOPENTOLATE-PHENYLEPHRINE 0.2-1 % OP SOLN
1.0000 [drp] | OPHTHALMIC | Status: AC
  Administered 2013-02-27 (×3): 1 [drp] via OPHTHALMIC

## 2013-02-27 MED ORDER — FENTANYL CITRATE 0.05 MG/ML IJ SOLN: 25.0000 ug | INTRAMUSCULAR | Status: DC | PRN

## 2013-02-27 MED ORDER — LIDOCAINE HCL (PF) 1 % IJ SOLN
INTRAMUSCULAR | Status: AC
  Filled 2013-02-27: qty 2

## 2013-02-27 MED ORDER — EPINEPHRINE HCL 1 MG/ML IJ SOLN
INTRAOCULAR | Status: DC | PRN
  Administered 2013-02-27: 08:00:00

## 2013-02-27 MED ORDER — MIDAZOLAM HCL 2 MG/2ML IJ SOLN
1.0000 mg | INTRAMUSCULAR | Status: DC | PRN
  Administered 2013-02-27 (×2): 2 mg via INTRAVENOUS
  Filled 2013-02-27 (×2): qty 2

## 2013-02-27 MED ORDER — LACTATED RINGERS IV SOLN
INTRAVENOUS | Status: DC
  Administered 2013-02-27: 07:00:00 via INTRAVENOUS

## 2013-02-27 MED ORDER — ONDANSETRON HCL 4 MG/2ML IJ SOLN: 4.0000 mg | Freq: Once | INTRAMUSCULAR | Status: DC | PRN

## 2013-02-27 MED ORDER — LIDOCAINE HCL (PF) 1 % IJ SOLN
INTRAMUSCULAR | Status: DC | PRN
  Administered 2013-02-27: 1.5 mL

## 2013-02-27 MED ORDER — TETRACAINE HCL 0.5 % OP SOLN
1.0000 [drp] | OPHTHALMIC | Status: AC
  Administered 2013-02-27 (×3): 1 [drp] via OPHTHALMIC

## 2013-02-27 SURGICAL SUPPLY — 23 items
CAPSULAR TENSION RING-AMO (OPHTHALMIC RELATED) IMPLANT
CLOTH BEACON ORANGE TIMEOUT ST (SAFETY) ×1 IMPLANT
EYE SHIELD UNIVERSAL CLEAR (GAUZE/BANDAGES/DRESSINGS) ×1 IMPLANT
GLOVE BIO SURGEON STRL SZ 6.5 (GLOVE) ×1 IMPLANT
GLOVE ECLIPSE 6.5 STRL STRAW (GLOVE) IMPLANT
GLOVE ECLIPSE 7.0 STRL STRAW (GLOVE) IMPLANT
GLOVE EXAM NITRILE LRG STRL (GLOVE) IMPLANT
GLOVE EXAM NITRILE MD LF STRL (GLOVE) ×1 IMPLANT
GLOVE SKINSENSE NS SZ6.5 (GLOVE)
GLOVE SKINSENSE STRL SZ6.5 (GLOVE) IMPLANT
HEALON 5 0.6 ML (INTRAOCULAR LENS) IMPLANT
KIT VITRECTOMY (OPHTHALMIC RELATED) IMPLANT
PAD ARMBOARD 7.5X6 YLW CONV (MISCELLANEOUS) ×1 IMPLANT
PROC W NO LENS (INTRAOCULAR LENS)
PROC W SPEC LENS (INTRAOCULAR LENS)
PROCESS W NO LENS (INTRAOCULAR LENS) IMPLANT
PROCESS W SPEC LENS (INTRAOCULAR LENS) IMPLANT
RING MALYGIN (MISCELLANEOUS) ×1 IMPLANT
SIGHTPATH CAT PROC W REG LENS (Ophthalmic Related) ×2 IMPLANT
TAPE SURG TRANSPORE 1 IN (GAUZE/BANDAGES/DRESSINGS) IMPLANT
TAPE SURGICAL TRANSPORE 1 IN (GAUZE/BANDAGES/DRESSINGS) ×1
VISCOELASTIC ADDITIONAL (OPHTHALMIC RELATED) IMPLANT
WATER STERILE IRR 250ML POUR (IV SOLUTION) ×1 IMPLANT

## 2013-02-27 NOTE — Anesthesia Postprocedure Evaluation (Signed)
  Anesthesia Post-op Note  Patient: [name]  Procedure(s) Performed: Procedure(s) with comments: CATARACT EXTRACTION PHACO AND INTRAOCULAR LENS PLACEMENT (IOC) (Left) - CDE:12.54  Patient Location: Short Stay  Anesthesia Type:MAC  Level of Consciousness: awake, alert  and oriented  Airway and Oxygen Therapy: Patient Spontanous Breathing  Post-op Pain: none  Post-op Assessment: Post-op Vital signs reviewed, Patient's Cardiovascular Status Stable, Respiratory Function Stable, Patent Airway and No signs of Nausea or vomiting  Post-op Vital Signs: Reviewed and stable  Complications: No apparent anesthesia complications

## 2013-02-27 NOTE — H&P (Signed)
The patient was re examined and there is no change in the patients condition since the original H and P. 

## 2013-02-27 NOTE — Op Note (Signed)
Patient brought to the operating room and prepped and draped in the usual manner.  Lid speculum inserted in right eye.  Stab incision made at the twelve o'clock position. Intraocular Xylocaine instilled. Provisc instilled in the anterior chamber.   A 2.4 mm. Stab incision was made temporally. Due to a small pupil, a Malugyn Ring was inserted.  An anterior capsulotomy was done with a bent 25 gauge needle.  The nucleus was hydrodissected.  The Phaco tip was inserted in the anterior chamber and the nucleus was emulsified.  CDE was 12.54.  The cortical material was then removed with the I and A tip.  Posterior capsule was the polished.  The anterior chamber was deepened with Provisc.  A 21.5 Diopter Rayner 570C IOL was then inserted in the capsular bag.  The Malugyn ring was removed.  Provisc was then removed with the I and A tip.  The wound was then hydrated.  Patient sent to the Recovery Room in good condition with follow up in my office.  Preoperative Diagnosis:  Nuclear Cataract OS Postoperative Diagnosis:  Same Procedure name: Kelman Phacoemulsification OS with IOL

## 2013-02-27 NOTE — Anesthesia Preprocedure Evaluation (Signed)
Anesthesia Evaluation  Patient identified by MRN, date of birth, ID band Patient awake    Reviewed: Allergy & Precautions, H&P , NPO status , Patient's Chart, lab work & pertinent test results  Airway Mallampati: II TM Distance: >3 FB     Dental  (+) Teeth Intact   Pulmonary shortness of breath and with exertion, pneumonia -, resolved, former smoker,  breath sounds clear to auscultation        Cardiovascular hypertension, Pt. on medications Rhythm:Regular Rate:Normal     Neuro/Psych    GI/Hepatic negative GI ROS,   Endo/Other    Renal/GU      Musculoskeletal   Abdominal   Peds  Hematology   Anesthesia Other Findings   Reproductive/Obstetrics                           Anesthesia Physical Anesthesia Plan  ASA: II  Anesthesia Plan: MAC   Post-op Pain Management:    Induction: Intravenous  Airway Management Planned: Nasal Cannula  Additional Equipment:   Intra-op Plan:   Post-operative Plan:   Informed Consent: I have reviewed the patients History and Physical, chart, labs and discussed the procedure including the risks, benefits and alternatives for the proposed anesthesia with the patient or authorized representative who has indicated his/her understanding and acceptance.     Plan Discussed with:   Anesthesia Plan Comments:         Anesthesia Quick Evaluation

## 2013-02-27 NOTE — Transfer of Care (Signed)
Immediate Anesthesia Transfer of Care Note  Patient: [name]  Procedure(s) Performed: Procedure(s) with comments: CATARACT EXTRACTION PHACO AND INTRAOCULAR LENS PLACEMENT (IOC) (Left) - CDE:12.54  Patient Location: Short Stay  Anesthesia Type:MAC  Level of Consciousness: awake  Airway & Oxygen Therapy: Patient Spontanous Breathing  Post-op Assessment: Report given to PACU RN  Post vital signs: Reviewed  Complications: No apparent anesthesia complications

## 2013-02-28 ENCOUNTER — Encounter (HOSPITAL_COMMUNITY): Payer: Self-pay | Admitting: Ophthalmology

## 2013-06-11 DIAGNOSIS — C61 Malignant neoplasm of prostate: Secondary | ICD-10-CM | POA: Diagnosis not present

## 2013-06-18 DIAGNOSIS — H4011X0 Primary open-angle glaucoma, stage unspecified: Secondary | ICD-10-CM | POA: Diagnosis not present

## 2013-06-18 DIAGNOSIS — H35379 Puckering of macula, unspecified eye: Secondary | ICD-10-CM | POA: Diagnosis not present

## 2013-06-18 DIAGNOSIS — Z961 Presence of intraocular lens: Secondary | ICD-10-CM | POA: Diagnosis not present

## 2013-06-18 DIAGNOSIS — H409 Unspecified glaucoma: Secondary | ICD-10-CM | POA: Diagnosis not present

## 2013-06-26 DIAGNOSIS — Z961 Presence of intraocular lens: Secondary | ICD-10-CM | POA: Diagnosis not present

## 2013-08-03 DIAGNOSIS — Z6836 Body mass index (BMI) 36.0-36.9, adult: Secondary | ICD-10-CM | POA: Diagnosis not present

## 2013-08-03 DIAGNOSIS — Z79899 Other long term (current) drug therapy: Secondary | ICD-10-CM | POA: Diagnosis not present

## 2013-08-03 DIAGNOSIS — Z0000 Encounter for general adult medical examination without abnormal findings: Secondary | ICD-10-CM | POA: Diagnosis not present

## 2013-10-09 DIAGNOSIS — H00029 Hordeolum internum unspecified eye, unspecified eyelid: Secondary | ICD-10-CM | POA: Diagnosis not present

## 2013-10-23 DIAGNOSIS — H0019 Chalazion unspecified eye, unspecified eyelid: Secondary | ICD-10-CM | POA: Diagnosis not present

## 2013-10-24 DIAGNOSIS — C61 Malignant neoplasm of prostate: Secondary | ICD-10-CM | POA: Diagnosis not present

## 2014-01-10 DIAGNOSIS — Z23 Encounter for immunization: Secondary | ICD-10-CM | POA: Diagnosis not present

## 2014-01-24 DIAGNOSIS — H4011X1 Primary open-angle glaucoma, mild stage: Secondary | ICD-10-CM | POA: Diagnosis not present

## 2014-02-06 DIAGNOSIS — C61 Malignant neoplasm of prostate: Secondary | ICD-10-CM | POA: Diagnosis not present

## 2014-02-08 DIAGNOSIS — L309 Dermatitis, unspecified: Secondary | ICD-10-CM | POA: Diagnosis not present

## 2014-02-08 DIAGNOSIS — Z6835 Body mass index (BMI) 35.0-35.9, adult: Secondary | ICD-10-CM | POA: Diagnosis not present

## 2014-02-08 DIAGNOSIS — N390 Urinary tract infection, site not specified: Secondary | ICD-10-CM | POA: Diagnosis not present

## 2014-02-20 DIAGNOSIS — H4011X1 Primary open-angle glaucoma, mild stage: Secondary | ICD-10-CM | POA: Diagnosis not present

## 2014-02-20 DIAGNOSIS — Z961 Presence of intraocular lens: Secondary | ICD-10-CM | POA: Diagnosis not present

## 2014-02-20 DIAGNOSIS — H4011X2 Primary open-angle glaucoma, moderate stage: Secondary | ICD-10-CM | POA: Diagnosis not present

## 2014-02-20 DIAGNOSIS — H35371 Puckering of macula, right eye: Secondary | ICD-10-CM | POA: Diagnosis not present

## 2014-02-20 DIAGNOSIS — H52203 Unspecified astigmatism, bilateral: Secondary | ICD-10-CM | POA: Diagnosis not present

## 2014-03-22 DIAGNOSIS — E6609 Other obesity due to excess calories: Secondary | ICD-10-CM | POA: Diagnosis not present

## 2014-03-22 DIAGNOSIS — Z6835 Body mass index (BMI) 35.0-35.9, adult: Secondary | ICD-10-CM | POA: Diagnosis not present

## 2014-03-22 DIAGNOSIS — N342 Other urethritis: Secondary | ICD-10-CM | POA: Diagnosis not present

## 2014-05-27 DIAGNOSIS — E291 Testicular hypofunction: Secondary | ICD-10-CM | POA: Diagnosis not present

## 2014-05-27 DIAGNOSIS — C61 Malignant neoplasm of prostate: Secondary | ICD-10-CM | POA: Diagnosis not present

## 2014-05-28 ENCOUNTER — Other Ambulatory Visit: Payer: Self-pay | Admitting: Urology

## 2014-05-28 DIAGNOSIS — C61 Malignant neoplasm of prostate: Secondary | ICD-10-CM

## 2014-06-17 ENCOUNTER — Encounter (HOSPITAL_COMMUNITY): Payer: Self-pay

## 2014-06-17 ENCOUNTER — Encounter (HOSPITAL_COMMUNITY)
Admission: RE | Admit: 2014-06-17 | Discharge: 2014-06-17 | Disposition: A | Discharge status: Home / Self Care | Payer: Medicare Other | Source: Ambulatory Visit | Attending: Urology | Admitting: Urology

## 2014-06-17 DIAGNOSIS — Z96641 Presence of right artificial hip joint: Secondary | ICD-10-CM | POA: Diagnosis not present

## 2014-06-17 DIAGNOSIS — Z8546 Personal history of malignant neoplasm of prostate: Secondary | ICD-10-CM | POA: Diagnosis not present

## 2014-06-17 DIAGNOSIS — C61 Malignant neoplasm of prostate: Secondary | ICD-10-CM | POA: Diagnosis not present

## 2014-06-17 DIAGNOSIS — Z471 Aftercare following joint replacement surgery: Secondary | ICD-10-CM | POA: Diagnosis not present

## 2014-06-17 HISTORY — DX: Malignant (primary) neoplasm, unspecified: C80.1

## 2014-06-17 MED ORDER — TECHNETIUM TC 99M MEDRONATE IV KIT
25.0000 | PACK | Freq: Once | INTRAVENOUS | Status: AC | PRN
  Administered 2014-06-17: 25 via INTRAVENOUS

## 2014-06-21 ENCOUNTER — Ambulatory Visit (HOSPITAL_COMMUNITY)
Admission: RE | Admit: 2014-06-21 | Discharge: 2014-06-21 | Disposition: A | Discharge status: Home / Self Care | Payer: Medicare Other | Source: Ambulatory Visit | Attending: Urology | Admitting: Urology

## 2014-06-21 DIAGNOSIS — C61 Malignant neoplasm of prostate: Secondary | ICD-10-CM | POA: Diagnosis not present

## 2014-06-21 DIAGNOSIS — M810 Age-related osteoporosis without current pathological fracture: Secondary | ICD-10-CM | POA: Diagnosis not present

## 2014-07-03 DIAGNOSIS — E6609 Other obesity due to excess calories: Secondary | ICD-10-CM | POA: Diagnosis not present

## 2014-07-03 DIAGNOSIS — N342 Other urethritis: Secondary | ICD-10-CM | POA: Diagnosis not present

## 2014-07-03 DIAGNOSIS — Z6835 Body mass index (BMI) 35.0-35.9, adult: Secondary | ICD-10-CM | POA: Diagnosis not present

## 2014-07-18 DIAGNOSIS — N342 Other urethritis: Secondary | ICD-10-CM | POA: Diagnosis not present

## 2014-09-03 ENCOUNTER — Ambulatory Visit (INDEPENDENT_AMBULATORY_CARE_PROVIDER_SITE_OTHER): Payer: Medicare Other | Admitting: Urology

## 2014-09-03 DIAGNOSIS — C61 Malignant neoplasm of prostate: Secondary | ICD-10-CM

## 2014-09-03 DIAGNOSIS — E291 Testicular hypofunction: Secondary | ICD-10-CM | POA: Diagnosis not present

## 2014-09-03 DIAGNOSIS — M810 Age-related osteoporosis without current pathological fracture: Secondary | ICD-10-CM

## 2014-10-23 DIAGNOSIS — H409 Unspecified glaucoma: Secondary | ICD-10-CM | POA: Diagnosis not present

## 2014-10-23 DIAGNOSIS — H4011X2 Primary open-angle glaucoma, moderate stage: Secondary | ICD-10-CM | POA: Diagnosis not present

## 2014-10-23 DIAGNOSIS — Z961 Presence of intraocular lens: Secondary | ICD-10-CM | POA: Diagnosis not present

## 2014-10-23 DIAGNOSIS — H4011X1 Primary open-angle glaucoma, mild stage: Secondary | ICD-10-CM | POA: Diagnosis not present

## 2014-11-25 ENCOUNTER — Other Ambulatory Visit (HOSPITAL_COMMUNITY): Payer: Self-pay | Admitting: Physician Assistant

## 2014-11-25 ENCOUNTER — Ambulatory Visit (HOSPITAL_COMMUNITY)
Admission: RE | Admit: 2014-11-25 | Discharge: 2014-11-25 | Disposition: A | Discharge status: Home / Self Care | Payer: Medicare Other | Source: Ambulatory Visit | Attending: Physician Assistant | Admitting: Physician Assistant

## 2014-11-25 DIAGNOSIS — Z8546 Personal history of malignant neoplasm of prostate: Secondary | ICD-10-CM | POA: Diagnosis not present

## 2014-11-25 DIAGNOSIS — Z6835 Body mass index (BMI) 35.0-35.9, adult: Secondary | ICD-10-CM | POA: Diagnosis not present

## 2014-11-25 DIAGNOSIS — R079 Chest pain, unspecified: Secondary | ICD-10-CM

## 2014-11-25 DIAGNOSIS — Z0001 Encounter for general adult medical examination with abnormal findings: Secondary | ICD-10-CM | POA: Diagnosis not present

## 2014-11-25 DIAGNOSIS — Z1389 Encounter for screening for other disorder: Secondary | ICD-10-CM | POA: Diagnosis not present

## 2014-11-25 DIAGNOSIS — E6609 Other obesity due to excess calories: Secondary | ICD-10-CM | POA: Diagnosis not present

## 2014-11-25 DIAGNOSIS — R0602 Shortness of breath: Secondary | ICD-10-CM | POA: Insufficient documentation

## 2014-12-10 ENCOUNTER — Ambulatory Visit: Payer: Medicare Other | Admitting: Urology

## 2015-01-27 DIAGNOSIS — Z23 Encounter for immunization: Secondary | ICD-10-CM | POA: Diagnosis not present

## 2015-06-17 ENCOUNTER — Ambulatory Visit (INDEPENDENT_AMBULATORY_CARE_PROVIDER_SITE_OTHER): Payer: Medicare Other | Admitting: Urology

## 2015-06-17 DIAGNOSIS — C61 Malignant neoplasm of prostate: Secondary | ICD-10-CM | POA: Diagnosis not present

## 2015-06-20 ENCOUNTER — Other Ambulatory Visit: Payer: Self-pay | Admitting: Urology

## 2015-06-20 DIAGNOSIS — C61 Malignant neoplasm of prostate: Secondary | ICD-10-CM

## 2015-06-24 ENCOUNTER — Encounter (HOSPITAL_COMMUNITY): Payer: Self-pay

## 2015-06-24 ENCOUNTER — Encounter (HOSPITAL_COMMUNITY)
Admission: RE | Admit: 2015-06-24 | Discharge: 2015-06-24 | Disposition: A | Discharge status: Home / Self Care | Payer: Medicare Other | Source: Ambulatory Visit | Attending: Urology | Admitting: Urology

## 2015-06-24 DIAGNOSIS — C61 Malignant neoplasm of prostate: Secondary | ICD-10-CM | POA: Diagnosis not present

## 2015-06-24 MED ORDER — TECHNETIUM TC 99M MEDRONATE IV KIT
25.0000 | PACK | Freq: Once | INTRAVENOUS | Status: AC | PRN
  Administered 2015-06-24: 27 via INTRAVENOUS

## 2015-06-25 ENCOUNTER — Ambulatory Visit (HOSPITAL_COMMUNITY): Payer: Medicare Other

## 2015-07-01 ENCOUNTER — Ambulatory Visit (INDEPENDENT_AMBULATORY_CARE_PROVIDER_SITE_OTHER): Payer: Medicare Other | Admitting: Urology

## 2015-07-01 DIAGNOSIS — C61 Malignant neoplasm of prostate: Secondary | ICD-10-CM

## 2015-08-15 DIAGNOSIS — H401113 Primary open-angle glaucoma, right eye, severe stage: Secondary | ICD-10-CM | POA: Diagnosis not present

## 2015-08-18 DIAGNOSIS — Z961 Presence of intraocular lens: Secondary | ICD-10-CM | POA: Diagnosis not present

## 2015-08-18 DIAGNOSIS — H401112 Primary open-angle glaucoma, right eye, moderate stage: Secondary | ICD-10-CM | POA: Diagnosis not present

## 2015-08-18 DIAGNOSIS — H401121 Primary open-angle glaucoma, left eye, mild stage: Secondary | ICD-10-CM | POA: Diagnosis not present

## 2015-08-18 DIAGNOSIS — H35371 Puckering of macula, right eye: Secondary | ICD-10-CM | POA: Diagnosis not present

## 2015-10-14 ENCOUNTER — Ambulatory Visit (INDEPENDENT_AMBULATORY_CARE_PROVIDER_SITE_OTHER): Payer: Medicare Other | Admitting: Urology

## 2015-10-14 DIAGNOSIS — C61 Malignant neoplasm of prostate: Secondary | ICD-10-CM | POA: Diagnosis not present

## 2015-10-28 DIAGNOSIS — H26491 Other secondary cataract, right eye: Secondary | ICD-10-CM | POA: Diagnosis not present

## 2015-10-28 DIAGNOSIS — Z961 Presence of intraocular lens: Secondary | ICD-10-CM | POA: Diagnosis not present

## 2015-10-28 DIAGNOSIS — H401113 Primary open-angle glaucoma, right eye, severe stage: Secondary | ICD-10-CM | POA: Diagnosis not present

## 2015-11-04 ENCOUNTER — Ambulatory Visit (INDEPENDENT_AMBULATORY_CARE_PROVIDER_SITE_OTHER): Payer: Medicare Other | Admitting: Urology

## 2015-11-04 DIAGNOSIS — C61 Malignant neoplasm of prostate: Secondary | ICD-10-CM | POA: Diagnosis not present

## 2015-11-26 DIAGNOSIS — Z1389 Encounter for screening for other disorder: Secondary | ICD-10-CM | POA: Diagnosis not present

## 2015-11-26 DIAGNOSIS — R5383 Other fatigue: Secondary | ICD-10-CM | POA: Diagnosis not present

## 2015-11-26 DIAGNOSIS — Z0000 Encounter for general adult medical examination without abnormal findings: Secondary | ICD-10-CM | POA: Diagnosis not present

## 2015-11-26 DIAGNOSIS — Z6835 Body mass index (BMI) 35.0-35.9, adult: Secondary | ICD-10-CM | POA: Diagnosis not present

## 2015-11-26 DIAGNOSIS — Z23 Encounter for immunization: Secondary | ICD-10-CM | POA: Diagnosis not present

## 2015-11-26 DIAGNOSIS — E782 Mixed hyperlipidemia: Secondary | ICD-10-CM | POA: Diagnosis not present

## 2015-11-26 DIAGNOSIS — I10 Essential (primary) hypertension: Secondary | ICD-10-CM | POA: Diagnosis not present

## 2016-01-09 DIAGNOSIS — E6609 Other obesity due to excess calories: Secondary | ICD-10-CM | POA: Diagnosis not present

## 2016-01-09 DIAGNOSIS — N342 Other urethritis: Secondary | ICD-10-CM | POA: Diagnosis not present

## 2016-01-09 DIAGNOSIS — Z6835 Body mass index (BMI) 35.0-35.9, adult: Secondary | ICD-10-CM | POA: Diagnosis not present

## 2016-01-09 DIAGNOSIS — Z23 Encounter for immunization: Secondary | ICD-10-CM | POA: Diagnosis not present

## 2016-02-18 ENCOUNTER — Other Ambulatory Visit: Payer: Self-pay | Admitting: Urology

## 2016-02-18 DIAGNOSIS — C61 Malignant neoplasm of prostate: Secondary | ICD-10-CM

## 2016-03-02 ENCOUNTER — Ambulatory Visit (HOSPITAL_COMMUNITY)
Admission: RE | Admit: 2016-03-02 | Discharge: 2016-03-02 | Disposition: A | Discharge status: Home / Self Care | Payer: Medicare Other | Source: Ambulatory Visit | Attending: Urology | Admitting: Urology

## 2016-03-02 DIAGNOSIS — C61 Malignant neoplasm of prostate: Secondary | ICD-10-CM | POA: Diagnosis not present

## 2016-03-02 DIAGNOSIS — I700 Atherosclerosis of aorta: Secondary | ICD-10-CM | POA: Insufficient documentation

## 2016-03-02 LAB — POCT I-STAT CREATININE: Creatinine, Ser: 1 mg/dL (ref 0.61–1.24)

## 2016-03-02 MED ORDER — IOPAMIDOL (ISOVUE-300) INJECTION 61%
100.0000 mL | Freq: Once | INTRAVENOUS | Status: AC | PRN
  Administered 2016-03-02: 100 mL via INTRAVENOUS

## 2016-03-09 ENCOUNTER — Ambulatory Visit (INDEPENDENT_AMBULATORY_CARE_PROVIDER_SITE_OTHER): Payer: Medicare Other | Admitting: Urology

## 2016-03-09 DIAGNOSIS — R350 Frequency of micturition: Secondary | ICD-10-CM | POA: Diagnosis not present

## 2016-03-09 DIAGNOSIS — M810 Age-related osteoporosis without current pathological fracture: Secondary | ICD-10-CM | POA: Diagnosis not present

## 2016-03-09 DIAGNOSIS — C61 Malignant neoplasm of prostate: Secondary | ICD-10-CM | POA: Diagnosis not present

## 2016-03-09 DIAGNOSIS — E291 Testicular hypofunction: Secondary | ICD-10-CM

## 2016-03-15 DIAGNOSIS — N390 Urinary tract infection, site not specified: Secondary | ICD-10-CM | POA: Diagnosis not present

## 2016-04-21 DIAGNOSIS — H401121 Primary open-angle glaucoma, left eye, mild stage: Secondary | ICD-10-CM | POA: Diagnosis not present

## 2016-04-21 DIAGNOSIS — Z961 Presence of intraocular lens: Secondary | ICD-10-CM | POA: Diagnosis not present

## 2016-04-21 DIAGNOSIS — H401112 Primary open-angle glaucoma, right eye, moderate stage: Secondary | ICD-10-CM | POA: Diagnosis not present

## 2016-07-02 DIAGNOSIS — C61 Malignant neoplasm of prostate: Secondary | ICD-10-CM | POA: Diagnosis not present

## 2016-07-13 ENCOUNTER — Ambulatory Visit (INDEPENDENT_AMBULATORY_CARE_PROVIDER_SITE_OTHER): Payer: Medicare Other | Admitting: Urology

## 2016-07-13 DIAGNOSIS — R9721 Rising PSA following treatment for malignant neoplasm of prostate: Secondary | ICD-10-CM

## 2016-07-13 DIAGNOSIS — C61 Malignant neoplasm of prostate: Secondary | ICD-10-CM

## 2016-07-13 DIAGNOSIS — M810 Age-related osteoporosis without current pathological fracture: Secondary | ICD-10-CM | POA: Diagnosis not present

## 2016-07-13 DIAGNOSIS — E291 Testicular hypofunction: Secondary | ICD-10-CM

## 2016-08-31 DIAGNOSIS — I10 Essential (primary) hypertension: Secondary | ICD-10-CM | POA: Diagnosis not present

## 2016-08-31 DIAGNOSIS — C61 Malignant neoplasm of prostate: Secondary | ICD-10-CM | POA: Diagnosis not present

## 2016-08-31 DIAGNOSIS — H6122 Impacted cerumen, left ear: Secondary | ICD-10-CM | POA: Diagnosis not present

## 2016-08-31 DIAGNOSIS — Z1389 Encounter for screening for other disorder: Secondary | ICD-10-CM | POA: Diagnosis not present

## 2016-08-31 DIAGNOSIS — Z6835 Body mass index (BMI) 35.0-35.9, adult: Secondary | ICD-10-CM | POA: Diagnosis not present

## 2016-08-31 DIAGNOSIS — E782 Mixed hyperlipidemia: Secondary | ICD-10-CM | POA: Diagnosis not present

## 2016-09-30 DIAGNOSIS — E6609 Other obesity due to excess calories: Secondary | ICD-10-CM | POA: Diagnosis not present

## 2016-09-30 DIAGNOSIS — Z6835 Body mass index (BMI) 35.0-35.9, adult: Secondary | ICD-10-CM | POA: Diagnosis not present

## 2016-09-30 DIAGNOSIS — N342 Other urethritis: Secondary | ICD-10-CM | POA: Diagnosis not present

## 2016-09-30 DIAGNOSIS — Z1389 Encounter for screening for other disorder: Secondary | ICD-10-CM | POA: Diagnosis not present

## 2016-10-25 DIAGNOSIS — H401112 Primary open-angle glaucoma, right eye, moderate stage: Secondary | ICD-10-CM | POA: Diagnosis not present

## 2016-10-25 DIAGNOSIS — H401121 Primary open-angle glaucoma, left eye, mild stage: Secondary | ICD-10-CM | POA: Diagnosis not present

## 2016-10-25 DIAGNOSIS — H402212 Chronic angle-closure glaucoma, right eye, moderate stage: Secondary | ICD-10-CM | POA: Diagnosis not present

## 2016-11-02 DIAGNOSIS — H401133 Primary open-angle glaucoma, bilateral, severe stage: Secondary | ICD-10-CM | POA: Diagnosis not present

## 2016-11-02 DIAGNOSIS — Z961 Presence of intraocular lens: Secondary | ICD-10-CM | POA: Diagnosis not present

## 2016-11-03 DIAGNOSIS — C61 Malignant neoplasm of prostate: Secondary | ICD-10-CM | POA: Diagnosis not present

## 2016-11-16 ENCOUNTER — Ambulatory Visit (INDEPENDENT_AMBULATORY_CARE_PROVIDER_SITE_OTHER): Payer: Medicare Other | Admitting: Urology

## 2016-11-16 DIAGNOSIS — Z79899 Other long term (current) drug therapy: Secondary | ICD-10-CM | POA: Diagnosis not present

## 2016-11-16 DIAGNOSIS — R9721 Rising PSA following treatment for malignant neoplasm of prostate: Secondary | ICD-10-CM | POA: Diagnosis not present

## 2016-11-16 DIAGNOSIS — C61 Malignant neoplasm of prostate: Secondary | ICD-10-CM | POA: Diagnosis not present

## 2016-12-22 DIAGNOSIS — Z6836 Body mass index (BMI) 36.0-36.9, adult: Secondary | ICD-10-CM | POA: Diagnosis not present

## 2016-12-22 DIAGNOSIS — Z0000 Encounter for general adult medical examination without abnormal findings: Secondary | ICD-10-CM | POA: Diagnosis not present

## 2016-12-30 DIAGNOSIS — D400 Neoplasm of uncertain behavior of prostate: Secondary | ICD-10-CM | POA: Diagnosis not present

## 2016-12-30 DIAGNOSIS — E6609 Other obesity due to excess calories: Secondary | ICD-10-CM | POA: Diagnosis not present

## 2016-12-30 DIAGNOSIS — E782 Mixed hyperlipidemia: Secondary | ICD-10-CM | POA: Diagnosis not present

## 2016-12-30 DIAGNOSIS — I10 Essential (primary) hypertension: Secondary | ICD-10-CM | POA: Diagnosis not present

## 2016-12-30 DIAGNOSIS — Z23 Encounter for immunization: Secondary | ICD-10-CM | POA: Diagnosis not present

## 2016-12-30 DIAGNOSIS — Z6836 Body mass index (BMI) 36.0-36.9, adult: Secondary | ICD-10-CM | POA: Diagnosis not present

## 2017-03-17 DIAGNOSIS — C61 Malignant neoplasm of prostate: Secondary | ICD-10-CM | POA: Diagnosis not present

## 2017-03-22 ENCOUNTER — Ambulatory Visit (INDEPENDENT_AMBULATORY_CARE_PROVIDER_SITE_OTHER): Payer: Medicare Other | Admitting: Urology

## 2017-03-22 DIAGNOSIS — R9721 Rising PSA following treatment for malignant neoplasm of prostate: Secondary | ICD-10-CM

## 2017-03-22 DIAGNOSIS — Z79899 Other long term (current) drug therapy: Secondary | ICD-10-CM

## 2017-03-22 DIAGNOSIS — M810 Age-related osteoporosis without current pathological fracture: Secondary | ICD-10-CM

## 2017-03-22 DIAGNOSIS — C61 Malignant neoplasm of prostate: Secondary | ICD-10-CM | POA: Diagnosis not present

## 2017-06-10 DIAGNOSIS — H401133 Primary open-angle glaucoma, bilateral, severe stage: Secondary | ICD-10-CM | POA: Diagnosis not present

## 2017-06-22 DIAGNOSIS — H401121 Primary open-angle glaucoma, left eye, mild stage: Secondary | ICD-10-CM | POA: Diagnosis not present

## 2017-06-22 DIAGNOSIS — H401112 Primary open-angle glaucoma, right eye, moderate stage: Secondary | ICD-10-CM | POA: Diagnosis not present

## 2017-06-22 DIAGNOSIS — H402212 Chronic angle-closure glaucoma, right eye, moderate stage: Secondary | ICD-10-CM | POA: Diagnosis not present

## 2017-06-30 DIAGNOSIS — Z6836 Body mass index (BMI) 36.0-36.9, adult: Secondary | ICD-10-CM | POA: Diagnosis not present

## 2017-06-30 DIAGNOSIS — J069 Acute upper respiratory infection, unspecified: Secondary | ICD-10-CM | POA: Diagnosis not present

## 2017-06-30 DIAGNOSIS — N419 Inflammatory disease of prostate, unspecified: Secondary | ICD-10-CM | POA: Diagnosis not present

## 2017-06-30 DIAGNOSIS — E6609 Other obesity due to excess calories: Secondary | ICD-10-CM | POA: Diagnosis not present

## 2017-07-25 DIAGNOSIS — H401121 Primary open-angle glaucoma, left eye, mild stage: Secondary | ICD-10-CM | POA: Diagnosis not present

## 2017-07-25 DIAGNOSIS — H401112 Primary open-angle glaucoma, right eye, moderate stage: Secondary | ICD-10-CM | POA: Diagnosis not present

## 2017-07-26 ENCOUNTER — Ambulatory Visit (INDEPENDENT_AMBULATORY_CARE_PROVIDER_SITE_OTHER): Payer: Medicare Other | Admitting: Urology

## 2017-07-26 DIAGNOSIS — C61 Malignant neoplasm of prostate: Secondary | ICD-10-CM

## 2017-07-26 DIAGNOSIS — E291 Testicular hypofunction: Secondary | ICD-10-CM | POA: Diagnosis not present

## 2017-07-26 DIAGNOSIS — Z79899 Other long term (current) drug therapy: Secondary | ICD-10-CM | POA: Diagnosis not present

## 2017-07-26 DIAGNOSIS — M818 Other osteoporosis without current pathological fracture: Secondary | ICD-10-CM | POA: Diagnosis not present

## 2017-07-27 ENCOUNTER — Other Ambulatory Visit: Payer: Self-pay | Admitting: Urology

## 2017-07-27 DIAGNOSIS — C61 Malignant neoplasm of prostate: Secondary | ICD-10-CM

## 2017-08-03 ENCOUNTER — Encounter (HOSPITAL_COMMUNITY)
Admission: RE | Admit: 2017-08-03 | Discharge: 2017-08-03 | Disposition: A | Discharge status: Home / Self Care | Payer: Medicare Other | Source: Ambulatory Visit | Attending: Urology | Admitting: Urology

## 2017-08-03 DIAGNOSIS — C61 Malignant neoplasm of prostate: Secondary | ICD-10-CM | POA: Diagnosis not present

## 2017-08-03 MED ORDER — TECHNETIUM TC 99M MEDRONATE IV KIT
20.0000 | PACK | Freq: Once | INTRAVENOUS | Status: AC | PRN
  Administered 2017-08-03: 20.9 via INTRAVENOUS

## 2017-08-12 ENCOUNTER — Encounter (HOSPITAL_COMMUNITY): Payer: Self-pay

## 2017-08-12 ENCOUNTER — Ambulatory Visit (HOSPITAL_COMMUNITY)
Admission: RE | Admit: 2017-08-12 | Discharge: 2017-08-12 | Disposition: A | Discharge status: Home / Self Care | Payer: Medicare Other | Source: Ambulatory Visit | Attending: Urology | Admitting: Urology

## 2017-08-12 DIAGNOSIS — C61 Malignant neoplasm of prostate: Secondary | ICD-10-CM | POA: Insufficient documentation

## 2017-08-12 LAB — POCT I-STAT CREATININE: CREATININE: 1.1 mg/dL (ref 0.61–1.24)

## 2017-08-12 MED ORDER — IOPAMIDOL (ISOVUE-300) INJECTION 61%: 100.0000 mL | Freq: Once | INTRAVENOUS | Status: DC | PRN

## 2017-08-12 MED ORDER — IOPAMIDOL (ISOVUE-300) INJECTION 61%
100.0000 mL | Freq: Once | INTRAVENOUS | Status: AC | PRN
  Administered 2017-08-12: 100 mL via INTRAVENOUS

## 2017-11-22 DIAGNOSIS — C61 Malignant neoplasm of prostate: Secondary | ICD-10-CM | POA: Diagnosis not present

## 2017-11-29 ENCOUNTER — Ambulatory Visit (INDEPENDENT_AMBULATORY_CARE_PROVIDER_SITE_OTHER): Payer: Medicare Other | Admitting: Urology

## 2017-11-29 DIAGNOSIS — Z79899 Other long term (current) drug therapy: Secondary | ICD-10-CM | POA: Diagnosis not present

## 2017-11-29 DIAGNOSIS — C61 Malignant neoplasm of prostate: Secondary | ICD-10-CM | POA: Diagnosis not present

## 2017-11-29 DIAGNOSIS — R9721 Rising PSA following treatment for malignant neoplasm of prostate: Secondary | ICD-10-CM | POA: Diagnosis not present

## 2017-11-30 DIAGNOSIS — H401112 Primary open-angle glaucoma, right eye, moderate stage: Secondary | ICD-10-CM | POA: Diagnosis not present

## 2017-11-30 DIAGNOSIS — H401121 Primary open-angle glaucoma, left eye, mild stage: Secondary | ICD-10-CM | POA: Diagnosis not present

## 2018-01-03 DIAGNOSIS — Z23 Encounter for immunization: Secondary | ICD-10-CM | POA: Diagnosis not present

## 2018-01-03 DIAGNOSIS — Z6839 Body mass index (BMI) 39.0-39.9, adult: Secondary | ICD-10-CM | POA: Diagnosis not present

## 2018-01-03 DIAGNOSIS — Z1389 Encounter for screening for other disorder: Secondary | ICD-10-CM | POA: Diagnosis not present

## 2018-01-03 DIAGNOSIS — E6609 Other obesity due to excess calories: Secondary | ICD-10-CM | POA: Diagnosis not present

## 2018-01-03 DIAGNOSIS — Z0000 Encounter for general adult medical examination without abnormal findings: Secondary | ICD-10-CM | POA: Diagnosis not present

## 2018-04-04 ENCOUNTER — Ambulatory Visit (INDEPENDENT_AMBULATORY_CARE_PROVIDER_SITE_OTHER): Payer: Medicare Other | Admitting: Urology

## 2018-04-04 DIAGNOSIS — M818 Other osteoporosis without current pathological fracture: Secondary | ICD-10-CM | POA: Diagnosis not present

## 2018-04-04 DIAGNOSIS — R9721 Rising PSA following treatment for malignant neoplasm of prostate: Secondary | ICD-10-CM

## 2018-04-04 DIAGNOSIS — Z79899 Other long term (current) drug therapy: Secondary | ICD-10-CM

## 2018-04-04 DIAGNOSIS — C61 Malignant neoplasm of prostate: Secondary | ICD-10-CM

## 2018-05-19 IMAGING — CT CT ABD-PELV W/ CM
2 of 5 series · 16 of 46 positions shown, 18 images · IV contrast (iopamidol)
Comparison: 03/02/2016

CLINICAL DATA: Restaging prostate cancer.

EXAM:
CT ABDOMEN AND PELVIS WITH CONTRAST
TECHNIQUE: Multidetector CT imaging of the abdomen and pelvis was performed
using the standard protocol following bolus administration of
intravenous contrast.
CONTRAST:  100mL 6YF946-3XX IOPAMIDOL (6YF946-3XX) INJECTION 61%,
<See Chart> 6YF946-3XX IOPAMIDOL (6YF946-3XX) INJECTION 61%

[Series 2: axial st · axial · 0.77mm/px · z∈[-371,-11]mm · 13 of 85 slices shown, 15 images]
[im 7/85  soft-tissue]
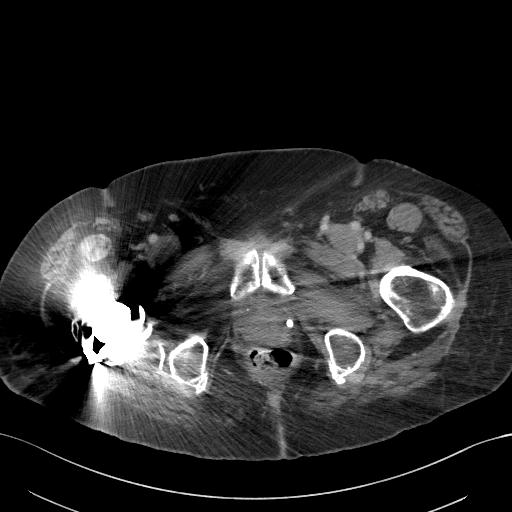
[im 7/85  bone]
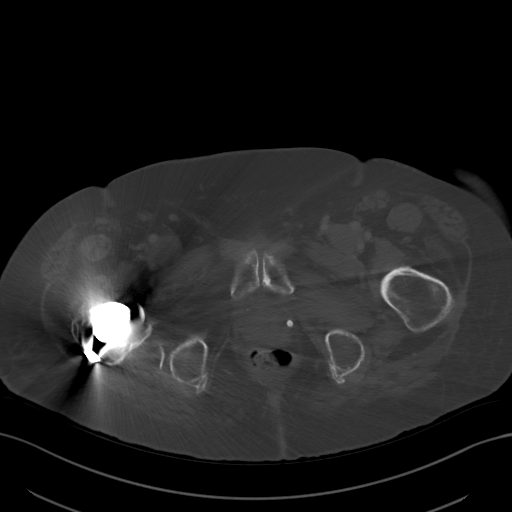
[im 13/85  soft-tissue]
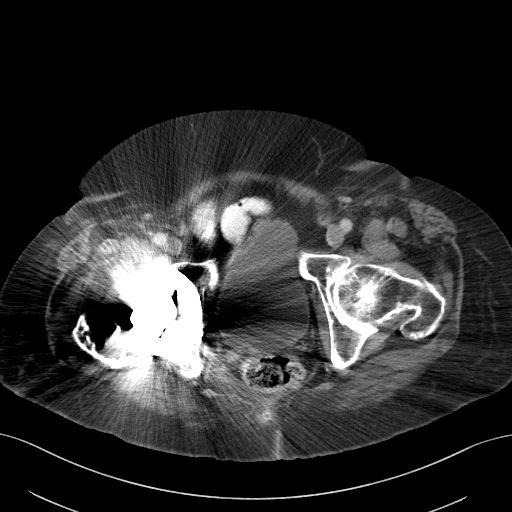
[im 19/85  soft-tissue]
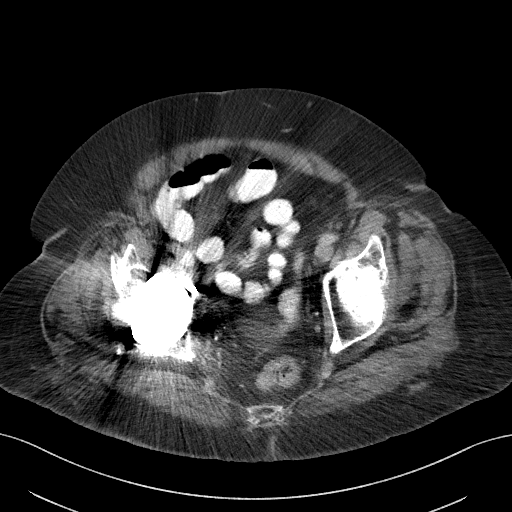
[im 25/85  soft-tissue]
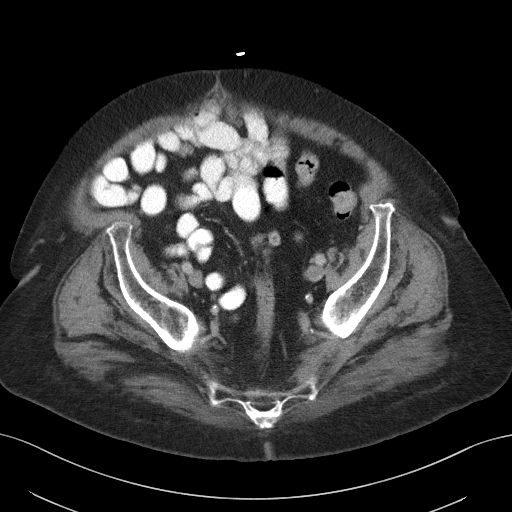
[im 31/85  soft-tissue]
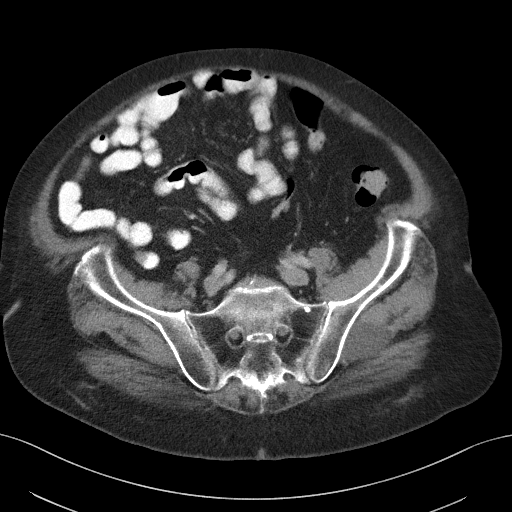
[im 37/85  soft-tissue]
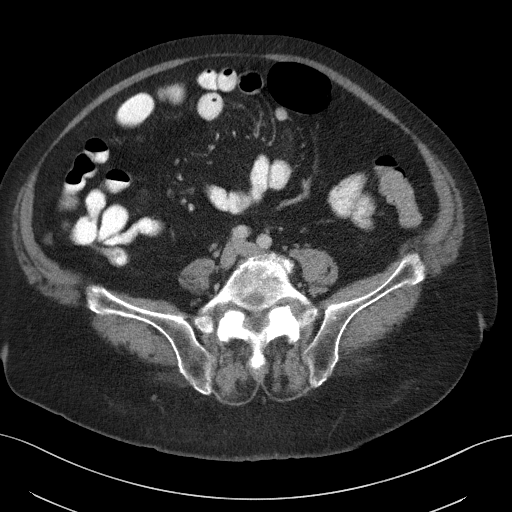
[im 43/85  soft-tissue]
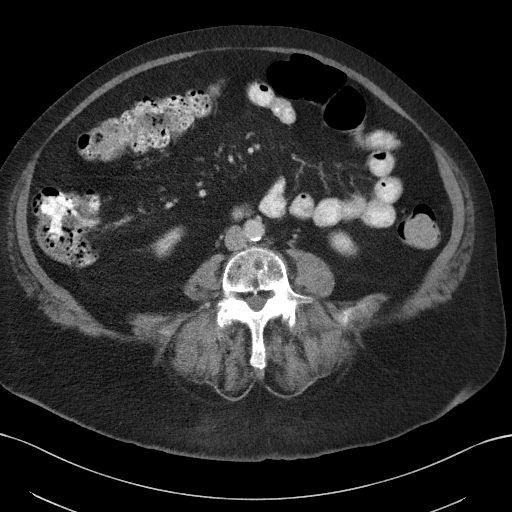
[im 49/85  soft-tissue]
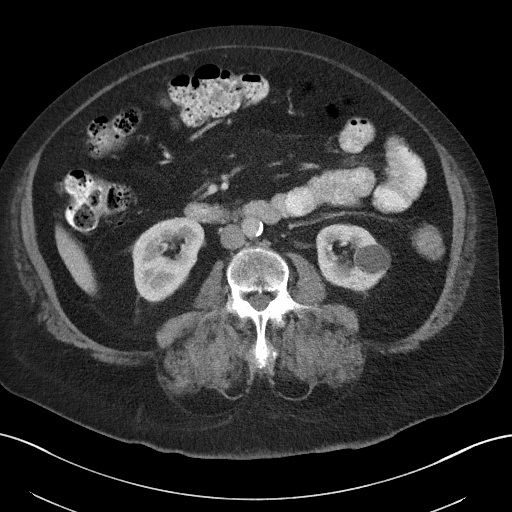
[im 55/85  soft-tissue]
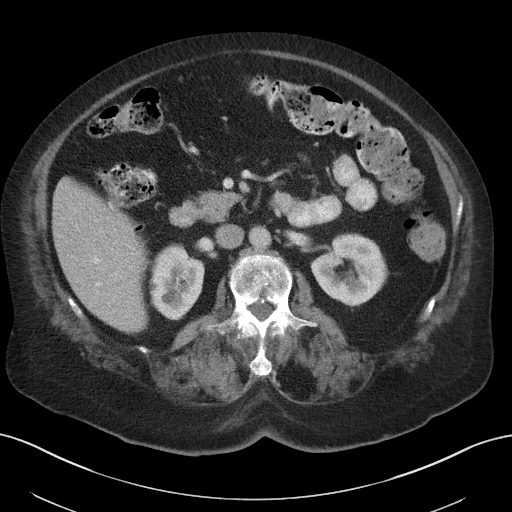
[im 55/85  bone]
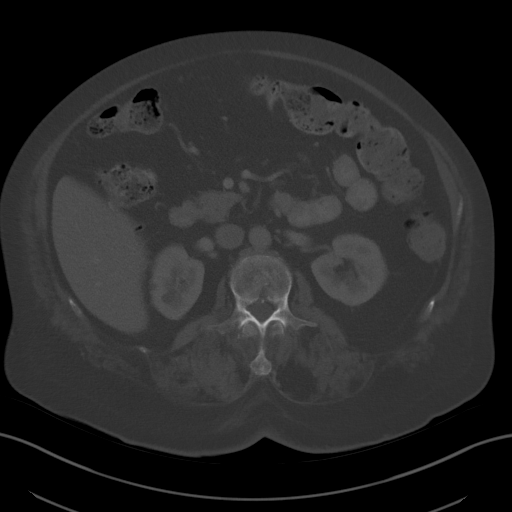
[im 61/85  soft-tissue]
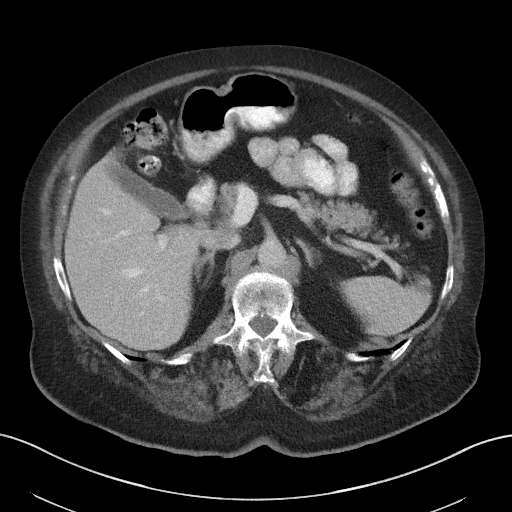
[im 67/85  soft-tissue]
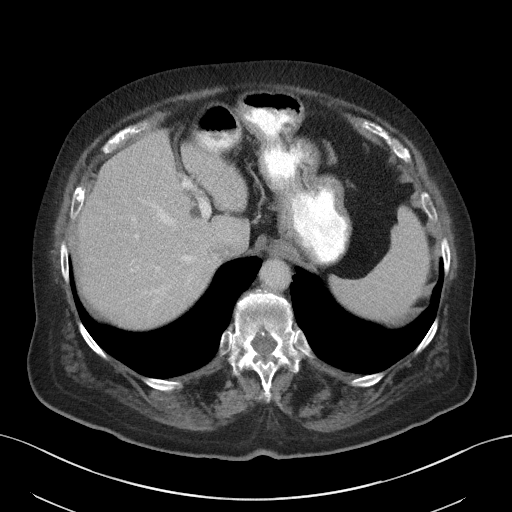
[im 73/85  soft-tissue]
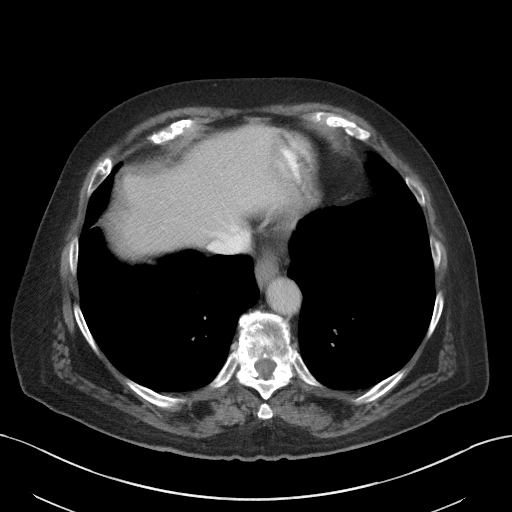
[im 79/85  soft-tissue]
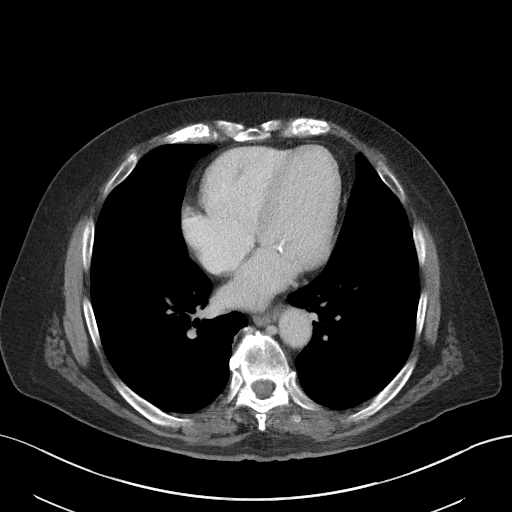

[Series 5: coronal st · coronal · 0.74mm/px · 3 of 102 slices shown]
[im 34/102  soft-tissue]
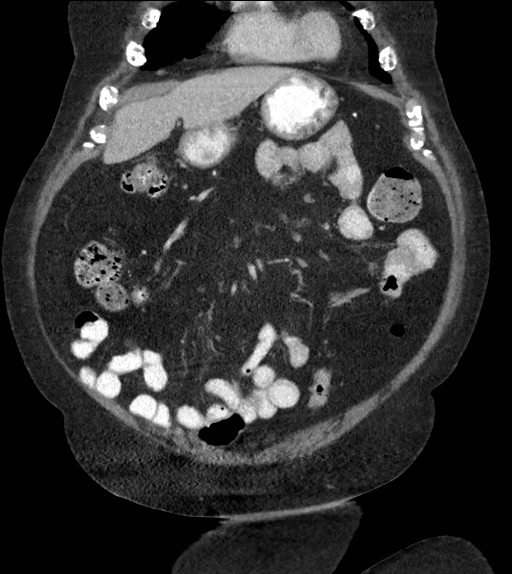
[im 45/102  soft-tissue]
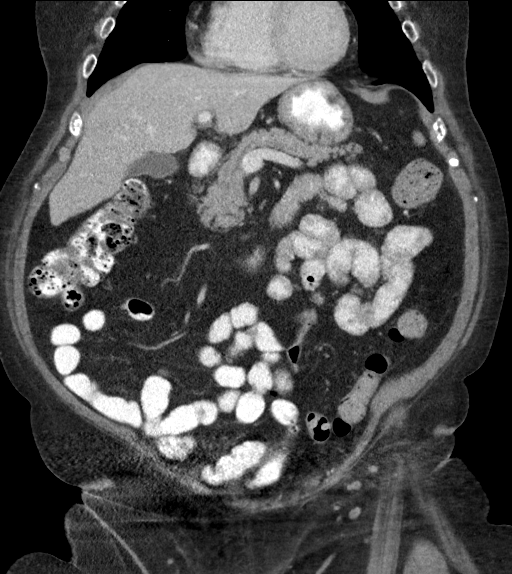
[im 57/102  soft-tissue]
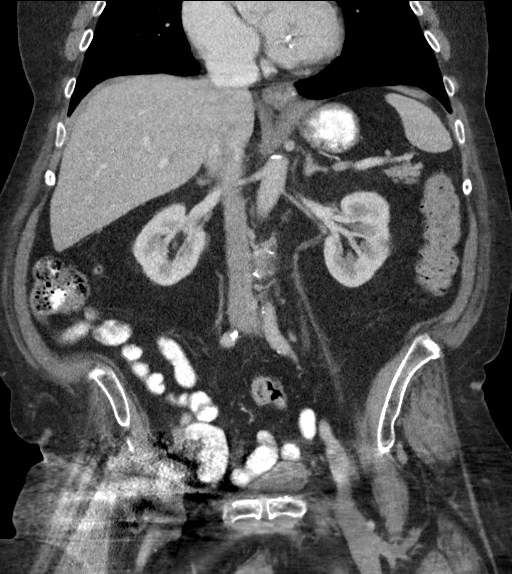

[16 of 46 positions shown; findings below may reference images not displayed]

FINDINGS: Lower chest: The lung bases are clear of acute process. No pleural
effusion or pulmonary lesions. The heart is normal in size. No
pericardial effusion. The distal esophagus and aorta are
unremarkable. Small hiatal hernia.

Hepatobiliary: No focal hepatic lesions or intrahepatic biliary
dilatation. Gallbladder is normal. No common bile duct dilatation.

Pancreas: No mass, inflammation or ductal dilatation.

Spleen: Normal size.  No focal lesions.

Adrenals/Urinary Tract: The adrenal glands and kidneys are
unremarkable and stable. Simple appearing renal cysts. The bladder
appears normal.

Stomach/Bowel: The stomach, duodenum, small bowel and colon are
grossly normal. No acute inflammatory changes, mass lesions or
obstructive findings. The terminal ileum is normal. The appendix is
normal.

Vascular/Lymphatic: Moderate atherosclerotic calcifications
involving the aorta and iliac arteries. No aneurysm or dissection.
The branch vessels are patent. The major venous structures are
patent. No mesenteric or retroperitoneal mass or adenopathy. Small
scattered lymph nodes are stable.

No pelvic adenopathy.

Reproductive: Status post prostatectomy. No findings for recurrent
tumor.

Other: No pelvic mass or adenopathy. No free pelvic fluid
collections. No inguinal mass or adenopathy. No abdominal wall
hernia or subcutaneous lesions.

Musculoskeletal: No acute bony findings. Right hip prosthesis again
demonstrated. No worrisome sclerotic bone lesions to suggest osseous
metastatic disease.
IMPRESSION: 1. No CT findings for metastatic prostate cancer.
2. No acute abdominal/pelvic findings, mass lesions or adenopathy.

## 2018-06-06 DIAGNOSIS — C61 Malignant neoplasm of prostate: Secondary | ICD-10-CM | POA: Diagnosis not present

## 2018-06-07 DIAGNOSIS — H26492 Other secondary cataract, left eye: Secondary | ICD-10-CM | POA: Diagnosis not present

## 2018-06-07 DIAGNOSIS — Z961 Presence of intraocular lens: Secondary | ICD-10-CM | POA: Diagnosis not present

## 2018-06-07 DIAGNOSIS — H401112 Primary open-angle glaucoma, right eye, moderate stage: Secondary | ICD-10-CM | POA: Diagnosis not present

## 2018-06-07 DIAGNOSIS — H401121 Primary open-angle glaucoma, left eye, mild stage: Secondary | ICD-10-CM | POA: Diagnosis not present

## 2018-08-15 ENCOUNTER — Ambulatory Visit (INDEPENDENT_AMBULATORY_CARE_PROVIDER_SITE_OTHER): Payer: Medicare Other | Admitting: Urology

## 2018-08-15 DIAGNOSIS — R9721 Rising PSA following treatment for malignant neoplasm of prostate: Secondary | ICD-10-CM | POA: Diagnosis not present

## 2018-08-15 DIAGNOSIS — Z79899 Other long term (current) drug therapy: Secondary | ICD-10-CM

## 2018-08-15 DIAGNOSIS — C61 Malignant neoplasm of prostate: Secondary | ICD-10-CM

## 2018-08-15 DIAGNOSIS — M818 Other osteoporosis without current pathological fracture: Secondary | ICD-10-CM

## 2018-09-26 DIAGNOSIS — Z1389 Encounter for screening for other disorder: Secondary | ICD-10-CM | POA: Diagnosis not present

## 2018-09-26 DIAGNOSIS — Z6836 Body mass index (BMI) 36.0-36.9, adult: Secondary | ICD-10-CM | POA: Diagnosis not present

## 2018-09-26 DIAGNOSIS — M1612 Unilateral primary osteoarthritis, left hip: Secondary | ICD-10-CM | POA: Diagnosis not present

## 2018-09-26 DIAGNOSIS — E6609 Other obesity due to excess calories: Secondary | ICD-10-CM | POA: Diagnosis not present

## 2018-12-05 DIAGNOSIS — C61 Malignant neoplasm of prostate: Secondary | ICD-10-CM | POA: Diagnosis not present

## 2018-12-06 DIAGNOSIS — H401112 Primary open-angle glaucoma, right eye, moderate stage: Secondary | ICD-10-CM | POA: Diagnosis not present

## 2018-12-06 DIAGNOSIS — H401121 Primary open-angle glaucoma, left eye, mild stage: Secondary | ICD-10-CM | POA: Diagnosis not present

## 2018-12-12 ENCOUNTER — Ambulatory Visit: Payer: Medicare Other | Admitting: Urology

## 2018-12-18 ENCOUNTER — Other Ambulatory Visit: Payer: Self-pay | Admitting: Urology

## 2018-12-18 DIAGNOSIS — C61 Malignant neoplasm of prostate: Secondary | ICD-10-CM

## 2018-12-21 ENCOUNTER — Other Ambulatory Visit: Payer: Self-pay

## 2018-12-21 ENCOUNTER — Encounter (HOSPITAL_COMMUNITY)
Admission: RE | Admit: 2018-12-21 | Discharge: 2018-12-21 | Disposition: A | Discharge status: Home / Self Care | Payer: Medicare Other | Source: Ambulatory Visit | Attending: Urology | Admitting: Urology

## 2018-12-21 DIAGNOSIS — C61 Malignant neoplasm of prostate: Secondary | ICD-10-CM | POA: Insufficient documentation

## 2018-12-21 MED ORDER — TECHNETIUM TC 99M MEDRONATE IV KIT
20.0000 | PACK | Freq: Once | INTRAVENOUS | Status: AC | PRN
  Administered 2018-12-21: 20.3 via INTRAVENOUS

## 2019-01-02 ENCOUNTER — Ambulatory Visit (HOSPITAL_COMMUNITY)
Admission: RE | Admit: 2019-01-02 | Discharge: 2019-01-02 | Disposition: A | Discharge status: Home / Self Care | Payer: Medicare Other | Source: Ambulatory Visit | Attending: Urology | Admitting: Urology

## 2019-01-02 ENCOUNTER — Other Ambulatory Visit: Payer: Self-pay

## 2019-01-02 DIAGNOSIS — C61 Malignant neoplasm of prostate: Secondary | ICD-10-CM | POA: Insufficient documentation

## 2019-01-02 LAB — POCT I-STAT CREATININE: Creatinine, Ser: 1.2 mg/dL (ref 0.61–1.24)

## 2019-01-02 MED ORDER — IOHEXOL 300 MG/ML  SOLN
100.0000 mL | Freq: Once | INTRAMUSCULAR | Status: AC | PRN
  Administered 2019-01-02: 100 mL via INTRAVENOUS

## 2019-01-16 ENCOUNTER — Ambulatory Visit: Payer: Medicare Other | Admitting: Urology

## 2019-01-31 DIAGNOSIS — Z1389 Encounter for screening for other disorder: Secondary | ICD-10-CM | POA: Diagnosis not present

## 2019-01-31 DIAGNOSIS — E6609 Other obesity due to excess calories: Secondary | ICD-10-CM | POA: Diagnosis not present

## 2019-01-31 DIAGNOSIS — Z6836 Body mass index (BMI) 36.0-36.9, adult: Secondary | ICD-10-CM | POA: Diagnosis not present

## 2019-01-31 DIAGNOSIS — Z0001 Encounter for general adult medical examination with abnormal findings: Secondary | ICD-10-CM | POA: Diagnosis not present

## 2019-01-31 DIAGNOSIS — M6281 Muscle weakness (generalized): Secondary | ICD-10-CM | POA: Diagnosis not present

## 2019-01-31 DIAGNOSIS — Z23 Encounter for immunization: Secondary | ICD-10-CM | POA: Diagnosis not present

## 2019-02-03 DIAGNOSIS — E6609 Other obesity due to excess calories: Secondary | ICD-10-CM | POA: Diagnosis not present

## 2019-02-03 DIAGNOSIS — I10 Essential (primary) hypertension: Secondary | ICD-10-CM | POA: Diagnosis not present

## 2019-02-03 DIAGNOSIS — Z96641 Presence of right artificial hip joint: Secondary | ICD-10-CM | POA: Diagnosis not present

## 2019-02-03 DIAGNOSIS — R2689 Other abnormalities of gait and mobility: Secondary | ICD-10-CM | POA: Diagnosis not present

## 2019-02-03 DIAGNOSIS — R531 Weakness: Secondary | ICD-10-CM | POA: Diagnosis not present

## 2019-02-03 DIAGNOSIS — M21372 Foot drop, left foot: Secondary | ICD-10-CM | POA: Diagnosis not present

## 2019-02-03 DIAGNOSIS — H9193 Unspecified hearing loss, bilateral: Secondary | ICD-10-CM | POA: Diagnosis not present

## 2019-02-03 DIAGNOSIS — C61 Malignant neoplasm of prostate: Secondary | ICD-10-CM | POA: Diagnosis not present

## 2019-02-03 DIAGNOSIS — M160 Bilateral primary osteoarthritis of hip: Secondary | ICD-10-CM | POA: Diagnosis not present

## 2019-02-03 DIAGNOSIS — Z6836 Body mass index (BMI) 36.0-36.9, adult: Secondary | ICD-10-CM | POA: Diagnosis not present

## 2019-02-03 DIAGNOSIS — R609 Edema, unspecified: Secondary | ICD-10-CM | POA: Diagnosis not present

## 2019-02-03 DIAGNOSIS — M21371 Foot drop, right foot: Secondary | ICD-10-CM | POA: Diagnosis not present

## 2019-02-03 DIAGNOSIS — Z9181 History of falling: Secondary | ICD-10-CM | POA: Diagnosis not present

## 2019-02-06 DIAGNOSIS — R609 Edema, unspecified: Secondary | ICD-10-CM | POA: Diagnosis not present

## 2019-02-06 DIAGNOSIS — Z9181 History of falling: Secondary | ICD-10-CM | POA: Diagnosis not present

## 2019-02-06 DIAGNOSIS — R531 Weakness: Secondary | ICD-10-CM | POA: Diagnosis not present

## 2019-02-06 DIAGNOSIS — C61 Malignant neoplasm of prostate: Secondary | ICD-10-CM | POA: Diagnosis not present

## 2019-02-06 DIAGNOSIS — M160 Bilateral primary osteoarthritis of hip: Secondary | ICD-10-CM | POA: Diagnosis not present

## 2019-02-06 DIAGNOSIS — Z96641 Presence of right artificial hip joint: Secondary | ICD-10-CM | POA: Diagnosis not present

## 2019-02-07 DIAGNOSIS — Z96641 Presence of right artificial hip joint: Secondary | ICD-10-CM | POA: Diagnosis not present

## 2019-02-07 DIAGNOSIS — R531 Weakness: Secondary | ICD-10-CM | POA: Diagnosis not present

## 2019-02-07 DIAGNOSIS — C61 Malignant neoplasm of prostate: Secondary | ICD-10-CM | POA: Diagnosis not present

## 2019-02-07 DIAGNOSIS — Z9181 History of falling: Secondary | ICD-10-CM | POA: Diagnosis not present

## 2019-02-07 DIAGNOSIS — R609 Edema, unspecified: Secondary | ICD-10-CM | POA: Diagnosis not present

## 2019-02-07 DIAGNOSIS — M160 Bilateral primary osteoarthritis of hip: Secondary | ICD-10-CM | POA: Diagnosis not present

## 2019-02-09 DIAGNOSIS — R531 Weakness: Secondary | ICD-10-CM | POA: Diagnosis not present

## 2019-02-09 DIAGNOSIS — Z9181 History of falling: Secondary | ICD-10-CM | POA: Diagnosis not present

## 2019-02-09 DIAGNOSIS — M160 Bilateral primary osteoarthritis of hip: Secondary | ICD-10-CM | POA: Diagnosis not present

## 2019-02-09 DIAGNOSIS — Z96641 Presence of right artificial hip joint: Secondary | ICD-10-CM | POA: Diagnosis not present

## 2019-02-09 DIAGNOSIS — R609 Edema, unspecified: Secondary | ICD-10-CM | POA: Diagnosis not present

## 2019-02-09 DIAGNOSIS — C61 Malignant neoplasm of prostate: Secondary | ICD-10-CM | POA: Diagnosis not present

## 2019-02-13 ENCOUNTER — Other Ambulatory Visit: Payer: Self-pay

## 2019-02-13 ENCOUNTER — Ambulatory Visit: Payer: Medicare Other | Admitting: Urology

## 2019-02-13 DIAGNOSIS — C61 Malignant neoplasm of prostate: Secondary | ICD-10-CM

## 2019-02-13 DIAGNOSIS — R9721 Rising PSA following treatment for malignant neoplasm of prostate: Secondary | ICD-10-CM

## 2019-02-14 DIAGNOSIS — Z96641 Presence of right artificial hip joint: Secondary | ICD-10-CM | POA: Diagnosis not present

## 2019-02-14 DIAGNOSIS — Z9181 History of falling: Secondary | ICD-10-CM | POA: Diagnosis not present

## 2019-02-14 DIAGNOSIS — R531 Weakness: Secondary | ICD-10-CM | POA: Diagnosis not present

## 2019-02-14 DIAGNOSIS — R609 Edema, unspecified: Secondary | ICD-10-CM | POA: Diagnosis not present

## 2019-02-14 DIAGNOSIS — C61 Malignant neoplasm of prostate: Secondary | ICD-10-CM | POA: Diagnosis not present

## 2019-02-14 DIAGNOSIS — M160 Bilateral primary osteoarthritis of hip: Secondary | ICD-10-CM | POA: Diagnosis not present

## 2019-02-16 DIAGNOSIS — M160 Bilateral primary osteoarthritis of hip: Secondary | ICD-10-CM | POA: Diagnosis not present

## 2019-02-16 DIAGNOSIS — Z96641 Presence of right artificial hip joint: Secondary | ICD-10-CM | POA: Diagnosis not present

## 2019-02-16 DIAGNOSIS — C61 Malignant neoplasm of prostate: Secondary | ICD-10-CM | POA: Diagnosis not present

## 2019-02-16 DIAGNOSIS — R531 Weakness: Secondary | ICD-10-CM | POA: Diagnosis not present

## 2019-02-16 DIAGNOSIS — R609 Edema, unspecified: Secondary | ICD-10-CM | POA: Diagnosis not present

## 2019-02-16 DIAGNOSIS — Z9181 History of falling: Secondary | ICD-10-CM | POA: Diagnosis not present

## 2019-02-19 DIAGNOSIS — C61 Malignant neoplasm of prostate: Secondary | ICD-10-CM | POA: Diagnosis not present

## 2019-02-19 DIAGNOSIS — Z96641 Presence of right artificial hip joint: Secondary | ICD-10-CM | POA: Diagnosis not present

## 2019-02-19 DIAGNOSIS — Z9181 History of falling: Secondary | ICD-10-CM | POA: Diagnosis not present

## 2019-02-19 DIAGNOSIS — R609 Edema, unspecified: Secondary | ICD-10-CM | POA: Diagnosis not present

## 2019-02-19 DIAGNOSIS — M160 Bilateral primary osteoarthritis of hip: Secondary | ICD-10-CM | POA: Diagnosis not present

## 2019-02-19 DIAGNOSIS — R531 Weakness: Secondary | ICD-10-CM | POA: Diagnosis not present

## 2019-02-21 DIAGNOSIS — R609 Edema, unspecified: Secondary | ICD-10-CM | POA: Diagnosis not present

## 2019-02-21 DIAGNOSIS — M160 Bilateral primary osteoarthritis of hip: Secondary | ICD-10-CM | POA: Diagnosis not present

## 2019-02-21 DIAGNOSIS — C61 Malignant neoplasm of prostate: Secondary | ICD-10-CM | POA: Diagnosis not present

## 2019-02-21 DIAGNOSIS — Z96641 Presence of right artificial hip joint: Secondary | ICD-10-CM | POA: Diagnosis not present

## 2019-02-21 DIAGNOSIS — R531 Weakness: Secondary | ICD-10-CM | POA: Diagnosis not present

## 2019-02-21 DIAGNOSIS — Z9181 History of falling: Secondary | ICD-10-CM | POA: Diagnosis not present

## 2019-02-22 DIAGNOSIS — Z96641 Presence of right artificial hip joint: Secondary | ICD-10-CM | POA: Diagnosis not present

## 2019-02-22 DIAGNOSIS — C61 Malignant neoplasm of prostate: Secondary | ICD-10-CM | POA: Diagnosis not present

## 2019-02-22 DIAGNOSIS — Z9181 History of falling: Secondary | ICD-10-CM | POA: Diagnosis not present

## 2019-02-22 DIAGNOSIS — R609 Edema, unspecified: Secondary | ICD-10-CM | POA: Diagnosis not present

## 2019-02-22 DIAGNOSIS — R531 Weakness: Secondary | ICD-10-CM | POA: Diagnosis not present

## 2019-02-22 DIAGNOSIS — M160 Bilateral primary osteoarthritis of hip: Secondary | ICD-10-CM | POA: Diagnosis not present

## 2019-02-26 DIAGNOSIS — M160 Bilateral primary osteoarthritis of hip: Secondary | ICD-10-CM | POA: Diagnosis not present

## 2019-02-26 DIAGNOSIS — R531 Weakness: Secondary | ICD-10-CM | POA: Diagnosis not present

## 2019-02-26 DIAGNOSIS — C61 Malignant neoplasm of prostate: Secondary | ICD-10-CM | POA: Diagnosis not present

## 2019-02-26 DIAGNOSIS — R609 Edema, unspecified: Secondary | ICD-10-CM | POA: Diagnosis not present

## 2019-02-27 DIAGNOSIS — Z9181 History of falling: Secondary | ICD-10-CM | POA: Diagnosis not present

## 2019-02-27 DIAGNOSIS — Z96641 Presence of right artificial hip joint: Secondary | ICD-10-CM | POA: Diagnosis not present

## 2019-02-27 DIAGNOSIS — R609 Edema, unspecified: Secondary | ICD-10-CM | POA: Diagnosis not present

## 2019-02-27 DIAGNOSIS — C61 Malignant neoplasm of prostate: Secondary | ICD-10-CM | POA: Diagnosis not present

## 2019-02-27 DIAGNOSIS — R531 Weakness: Secondary | ICD-10-CM | POA: Diagnosis not present

## 2019-02-27 DIAGNOSIS — M160 Bilateral primary osteoarthritis of hip: Secondary | ICD-10-CM | POA: Diagnosis not present

## 2019-03-01 DIAGNOSIS — M160 Bilateral primary osteoarthritis of hip: Secondary | ICD-10-CM | POA: Diagnosis not present

## 2019-03-01 DIAGNOSIS — Z96641 Presence of right artificial hip joint: Secondary | ICD-10-CM | POA: Diagnosis not present

## 2019-03-01 DIAGNOSIS — R531 Weakness: Secondary | ICD-10-CM | POA: Diagnosis not present

## 2019-03-01 DIAGNOSIS — Z9181 History of falling: Secondary | ICD-10-CM | POA: Diagnosis not present

## 2019-03-01 DIAGNOSIS — R609 Edema, unspecified: Secondary | ICD-10-CM | POA: Diagnosis not present

## 2019-03-01 DIAGNOSIS — C61 Malignant neoplasm of prostate: Secondary | ICD-10-CM | POA: Diagnosis not present

## 2019-03-05 DIAGNOSIS — I10 Essential (primary) hypertension: Secondary | ICD-10-CM | POA: Diagnosis not present

## 2019-03-05 DIAGNOSIS — H9193 Unspecified hearing loss, bilateral: Secondary | ICD-10-CM | POA: Diagnosis not present

## 2019-03-05 DIAGNOSIS — M21371 Foot drop, right foot: Secondary | ICD-10-CM | POA: Diagnosis not present

## 2019-03-05 DIAGNOSIS — R531 Weakness: Secondary | ICD-10-CM | POA: Diagnosis not present

## 2019-03-05 DIAGNOSIS — M160 Bilateral primary osteoarthritis of hip: Secondary | ICD-10-CM | POA: Diagnosis not present

## 2019-03-05 DIAGNOSIS — Z6836 Body mass index (BMI) 36.0-36.9, adult: Secondary | ICD-10-CM | POA: Diagnosis not present

## 2019-03-05 DIAGNOSIS — M21372 Foot drop, left foot: Secondary | ICD-10-CM | POA: Diagnosis not present

## 2019-03-05 DIAGNOSIS — Z9181 History of falling: Secondary | ICD-10-CM | POA: Diagnosis not present

## 2019-03-05 DIAGNOSIS — R2689 Other abnormalities of gait and mobility: Secondary | ICD-10-CM | POA: Diagnosis not present

## 2019-03-05 DIAGNOSIS — Z96641 Presence of right artificial hip joint: Secondary | ICD-10-CM | POA: Diagnosis not present

## 2019-03-05 DIAGNOSIS — E6609 Other obesity due to excess calories: Secondary | ICD-10-CM | POA: Diagnosis not present

## 2019-03-05 DIAGNOSIS — R609 Edema, unspecified: Secondary | ICD-10-CM | POA: Diagnosis not present

## 2019-03-05 DIAGNOSIS — C61 Malignant neoplasm of prostate: Secondary | ICD-10-CM | POA: Diagnosis not present

## 2019-03-07 DIAGNOSIS — R609 Edema, unspecified: Secondary | ICD-10-CM | POA: Diagnosis not present

## 2019-03-07 DIAGNOSIS — Z96641 Presence of right artificial hip joint: Secondary | ICD-10-CM | POA: Diagnosis not present

## 2019-03-07 DIAGNOSIS — M160 Bilateral primary osteoarthritis of hip: Secondary | ICD-10-CM | POA: Diagnosis not present

## 2019-03-07 DIAGNOSIS — C61 Malignant neoplasm of prostate: Secondary | ICD-10-CM | POA: Diagnosis not present

## 2019-03-07 DIAGNOSIS — Z9181 History of falling: Secondary | ICD-10-CM | POA: Diagnosis not present

## 2019-03-07 DIAGNOSIS — R531 Weakness: Secondary | ICD-10-CM | POA: Diagnosis not present

## 2019-03-12 DIAGNOSIS — R531 Weakness: Secondary | ICD-10-CM | POA: Diagnosis not present

## 2019-03-12 DIAGNOSIS — M160 Bilateral primary osteoarthritis of hip: Secondary | ICD-10-CM | POA: Diagnosis not present

## 2019-03-12 DIAGNOSIS — C61 Malignant neoplasm of prostate: Secondary | ICD-10-CM | POA: Diagnosis not present

## 2019-03-12 DIAGNOSIS — R609 Edema, unspecified: Secondary | ICD-10-CM | POA: Diagnosis not present

## 2019-03-12 DIAGNOSIS — Z9181 History of falling: Secondary | ICD-10-CM | POA: Diagnosis not present

## 2019-03-12 DIAGNOSIS — Z96641 Presence of right artificial hip joint: Secondary | ICD-10-CM | POA: Diagnosis not present

## 2019-03-14 DIAGNOSIS — Z96641 Presence of right artificial hip joint: Secondary | ICD-10-CM | POA: Diagnosis not present

## 2019-03-14 DIAGNOSIS — C61 Malignant neoplasm of prostate: Secondary | ICD-10-CM | POA: Diagnosis not present

## 2019-03-14 DIAGNOSIS — R531 Weakness: Secondary | ICD-10-CM | POA: Diagnosis not present

## 2019-03-14 DIAGNOSIS — R609 Edema, unspecified: Secondary | ICD-10-CM | POA: Diagnosis not present

## 2019-03-14 DIAGNOSIS — Z9181 History of falling: Secondary | ICD-10-CM | POA: Diagnosis not present

## 2019-03-14 DIAGNOSIS — M160 Bilateral primary osteoarthritis of hip: Secondary | ICD-10-CM | POA: Diagnosis not present

## 2019-04-19 DIAGNOSIS — I10 Essential (primary) hypertension: Secondary | ICD-10-CM | POA: Diagnosis not present

## 2019-04-19 DIAGNOSIS — E7849 Other hyperlipidemia: Secondary | ICD-10-CM | POA: Diagnosis not present

## 2019-04-24 ENCOUNTER — Other Ambulatory Visit: Payer: Self-pay

## 2019-04-24 DIAGNOSIS — C61 Malignant neoplasm of prostate: Secondary | ICD-10-CM

## 2019-05-07 ENCOUNTER — Encounter: Payer: Self-pay | Admitting: Urology

## 2019-06-19 ENCOUNTER — Ambulatory Visit: Payer: Medicare Other | Attending: Internal Medicine

## 2019-06-19 DIAGNOSIS — Z23 Encounter for immunization: Secondary | ICD-10-CM | POA: Insufficient documentation

## 2019-06-19 NOTE — Progress Notes (Signed)
   [id number]  Name:  Jesse AUJLA Sr.    MRN: [id number] DOB: 02-Jan-1929  06/19/2019  Mr. [name] was observed post Covid-19 immunization for 15 minutes without incident. He was provided with Vaccine Information Sheet and instruction to access the V-Safe system.   Mr. [name] was instructed to call 911 with any severe reactions post vaccine: Marland Kitchen Difficulty breathing  . Swelling of face and throat  . A fast heartbeat  . A bad rash all over body  . Dizziness and weakness   Immunizations Administered    Name Date Dose VIS Date Route   Moderna COVID-19 Vaccine 06/19/2019 12:09 AM 0.5 mL 03/20/2019 Intramuscular   Manufacturer: Moderna   Lot: [id number]   [id number]

## 2019-06-22 ENCOUNTER — Other Ambulatory Visit: Payer: Self-pay

## 2019-06-22 DIAGNOSIS — M818 Other osteoporosis without current pathological fracture: Secondary | ICD-10-CM

## 2019-06-22 MED ORDER — ALENDRONATE SODIUM 70 MG PO TABS: 70.0000 mg | ORAL_TABLET | ORAL | 11 refills | Status: DC

## 2019-07-05 ENCOUNTER — Encounter (HOSPITAL_COMMUNITY)
Admission: RE | Admit: 2019-07-05 | Discharge: 2019-07-05 | Disposition: A | Discharge status: Home / Self Care | Payer: Medicare Other | Source: Ambulatory Visit | Attending: Urology | Admitting: Urology

## 2019-07-05 ENCOUNTER — Ambulatory Visit (HOSPITAL_COMMUNITY): Payer: Medicare Other

## 2019-07-05 ENCOUNTER — Other Ambulatory Visit: Payer: Self-pay

## 2019-07-05 ENCOUNTER — Ambulatory Visit (HOSPITAL_COMMUNITY)
Admission: RE | Admit: 2019-07-05 | Discharge: 2019-07-05 | Disposition: A | Discharge status: Home / Self Care | Payer: Medicare Other | Source: Ambulatory Visit | Attending: Urology | Admitting: Urology

## 2019-07-05 DIAGNOSIS — C61 Malignant neoplasm of prostate: Secondary | ICD-10-CM | POA: Insufficient documentation

## 2019-07-05 LAB — POCT I-STAT CREATININE: Creatinine, Ser: 1.2 mg/dL (ref 0.61–1.24)

## 2019-07-05 MED ORDER — TECHNETIUM TC 99M MEDRONATE IV KIT
20.0000 | PACK | Freq: Once | INTRAVENOUS | Status: AC | PRN
Start: 2019-07-05 — End: 2019-07-05
  Administered 2019-07-05: 22 via INTRAVENOUS

## 2019-07-05 MED ORDER — IOHEXOL 300 MG/ML  SOLN
100.0000 mL | Freq: Once | INTRAMUSCULAR | Status: AC | PRN
  Administered 2019-07-05: 12:00:00 100 mL via INTRAVENOUS

## 2019-07-09 DIAGNOSIS — R600 Localized edema: Secondary | ICD-10-CM | POA: Diagnosis not present

## 2019-07-09 DIAGNOSIS — Z6834 Body mass index (BMI) 34.0-34.9, adult: Secondary | ICD-10-CM | POA: Diagnosis not present

## 2019-07-09 DIAGNOSIS — B351 Tinea unguium: Secondary | ICD-10-CM | POA: Diagnosis not present

## 2019-07-09 DIAGNOSIS — I872 Venous insufficiency (chronic) (peripheral): Secondary | ICD-10-CM | POA: Diagnosis not present

## 2019-07-09 DIAGNOSIS — E6609 Other obesity due to excess calories: Secondary | ICD-10-CM | POA: Diagnosis not present

## 2019-07-10 ENCOUNTER — Ambulatory Visit: Payer: Medicare Other | Admitting: Urology

## 2019-07-12 ENCOUNTER — Ambulatory Visit (HOSPITAL_COMMUNITY): Payer: Medicare Other

## 2019-07-17 ENCOUNTER — Ambulatory Visit: Payer: Medicare Other | Attending: Internal Medicine

## 2019-07-17 DIAGNOSIS — Z23 Encounter for immunization: Secondary | ICD-10-CM

## 2019-07-17 NOTE — Progress Notes (Signed)
COVID-19 Vaccine Information can be found at: [email or web address] For questions related to vaccine distribution or appointments, please email [email or web address] or call [phone].    

## 2019-07-17 NOTE — Progress Notes (Signed)
   [id number] [facility]  Name:  Jesse LATRAY Sr.    MRN: [id number] DOB: 07-04-1928  07/17/2019  [name] was observed post Covid-19 immunization for 15 minutes without incident. He was provided with Vaccine Information Sheet and instruction to access the V-Safe system.   [name] was instructed to call 911 with any severe reactions post vaccine: Marland Kitchen Difficulty breathing  . Swelling of face and throat  . A fast heartbeat  . A bad rash all over body  . Dizziness and weakness   Immunizations Administered    Name Date Dose VIS Date Route   Moderna COVID-19 Vaccine 07/17/2019 10:10 PM 0.5 mL 03/20/2019 Intramuscular   Manufacturer: Moderna   Lot: [id number]   [id number]

## 2019-07-24 ENCOUNTER — Encounter: Payer: Self-pay | Admitting: Urology

## 2019-07-24 ENCOUNTER — Ambulatory Visit (INDEPENDENT_AMBULATORY_CARE_PROVIDER_SITE_OTHER): Payer: Medicare Other | Admitting: Urology

## 2019-07-24 ENCOUNTER — Other Ambulatory Visit: Payer: Self-pay

## 2019-07-24 ENCOUNTER — Encounter (HOSPITAL_COMMUNITY): Payer: Self-pay | Admitting: Emergency Medicine

## 2019-07-24 ENCOUNTER — Emergency Department (HOSPITAL_COMMUNITY)
Admission: EM | Admit: 2019-07-24 | Discharge: 2019-07-24 | Disposition: A | Discharge status: Home / Self Care | Payer: Medicare Other | Attending: Emergency Medicine | Admitting: Emergency Medicine

## 2019-07-24 ENCOUNTER — Emergency Department (HOSPITAL_COMMUNITY): Payer: Medicare Other

## 2019-07-24 VITALS — BP 132/60 | HR 68 | Temp 97.0°F | Ht 66.0 in | Wt 219.0 lb

## 2019-07-24 DIAGNOSIS — Z7982 Long term (current) use of aspirin: Secondary | ICD-10-CM | POA: Insufficient documentation

## 2019-07-24 DIAGNOSIS — R600 Localized edema: Secondary | ICD-10-CM | POA: Diagnosis not present

## 2019-07-24 DIAGNOSIS — C61 Malignant neoplasm of prostate: Secondary | ICD-10-CM

## 2019-07-24 DIAGNOSIS — R0602 Shortness of breath: Secondary | ICD-10-CM | POA: Insufficient documentation

## 2019-07-24 DIAGNOSIS — Z79899 Other long term (current) drug therapy: Secondary | ICD-10-CM | POA: Insufficient documentation

## 2019-07-24 DIAGNOSIS — Z9660 Presence of unspecified orthopedic joint implant: Secondary | ICD-10-CM | POA: Insufficient documentation

## 2019-07-24 DIAGNOSIS — R609 Edema, unspecified: Secondary | ICD-10-CM

## 2019-07-24 DIAGNOSIS — I10 Essential (primary) hypertension: Secondary | ICD-10-CM | POA: Insufficient documentation

## 2019-07-24 LAB — CBC WITH DIFFERENTIAL/PLATELET
Abs Immature Granulocytes: 0.01 10*3/uL (ref 0.00–0.07)
Basophils Absolute: 0 10*3/uL (ref 0.0–0.1)
Basophils Relative: 1 %
Eosinophils Absolute: 0.3 10*3/uL (ref 0.0–0.5)
Eosinophils Relative: 6 %
HCT: 36.8 % — ABNORMAL LOW (ref 39.0–52.0)
Hemoglobin: 11.6 g/dL — ABNORMAL LOW (ref 13.0–17.0)
Immature Granulocytes: 0 %
Lymphocytes Relative: 50 %
Lymphs Abs: 2.7 10*3/uL (ref 0.7–4.0)
MCH: 32 pg (ref 26.0–34.0)
MCHC: 31.5 g/dL (ref 30.0–36.0)
MCV: 101.7 fL — ABNORMAL HIGH (ref 80.0–100.0)
Monocytes Absolute: 0.4 10*3/uL (ref 0.1–1.0)
Monocytes Relative: 8 %
Neutro Abs: 1.9 10*3/uL (ref 1.7–7.7)
Neutrophils Relative %: 35 %
Platelets: 169 10*3/uL (ref 150–400)
RBC: 3.62 MIL/uL — ABNORMAL LOW (ref 4.22–5.81)
RDW: 13.7 % (ref 11.5–15.5)
WBC: 5.3 10*3/uL (ref 4.0–10.5)
nRBC: 0 % (ref 0.0–0.2)

## 2019-07-24 LAB — HEPATIC FUNCTION PANEL
ALT: 15 U/L (ref 0–44)
AST: 24 U/L (ref 15–41)
Albumin: 4 g/dL (ref 3.5–5.0)
Alkaline Phosphatase: 52 U/L (ref 38–126)
Bilirubin, Direct: 0.2 mg/dL (ref 0.0–0.2)
Indirect Bilirubin: 0.6 mg/dL (ref 0.3–0.9)
Total Bilirubin: 0.8 mg/dL (ref 0.3–1.2)
Total Protein: 7.3 g/dL (ref 6.5–8.1)

## 2019-07-24 LAB — TROPONIN I (HIGH SENSITIVITY)
Troponin I (High Sensitivity): 17 ng/L (ref <18)
Troponin I (High Sensitivity): 18 ng/L — ABNORMAL HIGH (ref <18)

## 2019-07-24 LAB — PSA: PSA: 42.1 ng/mL — ABNORMAL HIGH (ref <=4.0)

## 2019-07-24 LAB — TESTOSTERONE: Testosterone: 162 ng/dL — ABNORMAL LOW (ref 250–827)

## 2019-07-24 LAB — BASIC METABOLIC PANEL
Anion gap: 12 (ref 5–15)
BUN: 32 mg/dL — ABNORMAL HIGH (ref 8–23)
CO2: 23 mmol/L (ref 22–32)
Calcium: 9.3 mg/dL (ref 8.9–10.3)
Chloride: 105 mmol/L (ref 98–111)
Creatinine, Ser: 1.33 mg/dL — ABNORMAL HIGH (ref 0.61–1.24)
GFR calc Af Amer: 55 mL/min — ABNORMAL LOW (ref >60)
GFR calc non Af Amer: 47 mL/min — ABNORMAL LOW (ref >60)
Glucose, Bld: 96 mg/dL (ref 70–99)
Potassium: 3.9 mmol/L (ref 3.5–5.1)
Sodium: 140 mmol/L (ref 135–145)

## 2019-07-24 LAB — BRAIN NATRIURETIC PEPTIDE: B Natriuretic Peptide: 142 pg/mL — ABNORMAL HIGH (ref 0.0–100.0)

## 2019-07-24 NOTE — Discharge Instructions (Signed)
You were seen today for shortness of breath and leg swelling. Your workup was reassuring. You did have mild anemia as well as a small bump in your kidney function. Your EKG showed persistent 1st degree heart block. All of these things should be followed closely by your primary care provider. Thank you for allowing me to care for you today. Please return to the emergency department if you have new or worsening symptoms.

## 2019-07-24 NOTE — Progress Notes (Signed)
H&P  Chief Complaint: Prostate Cancer  History of Present Illness:   4.6.2021: Here today for follow-up. He has not had a recent PSA. He reports having had worsened fatigue and decreased appetite but he thinks that his weight has remained stable. He does not c/o any specific urinary sx's. He denies having had any blood per urine or stool. He also notes that he was recently started on a fluid pill -- he does not note any specific side effects of this yet such as increased frequency. He does wear a pad to manage urge incontinence that is primarily secondary to his very limited mobility.   Recent CT A/P and bone scan revealed no evidence of metastatic dz  (below copied from [facility] records):  Prostate Cancer:  [name] is a 86 year-old male established patient who is here for prostate cancer which has been treated.  He did receive radiation therapy for his cancer. He was treated with xrt for his cancer. Patient denies brachytherapy and high dose radiation. His radiation treatment was complete 10/14/2003. He has undergone Hormonal Therapy for treatment.   He had EBRT for PCA in 2003--unknown stage/grade. When first seen in 2015, he had been on androgen deprivation therapy with Lupron and his PSA was 2.65. Before his first meeting, he was cared for by Dr. Michela Pitcher.   After his first visit, in Feb 2016 he had a bone scan which revealed no evidence of osseous metastatic disease. Bone density study did reveal osteoporosis. He was placed on alendronate 70 mg weekly.  His PSA in Feb 2016 was 2.70 (approximately what it was previously iin October, 2015). Testosterone levels were at castrate level. He had a repeat PSA in May, 2016 that was 2.9.   Because of increasing PSA (16 on 3.1.2017), he was restarted on androgen deprivation therapy in March, 2017. At that time, bone scan was negative for osseous metastatic disease.  In July, 2017 was down to 4.6.  CT scan in Nov 2017 revealed stable pulmonary nodules,  no evidence of intra-abdominal or skeletal metastatic disease.   12.4.2018: He is having some shoulder but no other bony pain. Most recent PSA is up to 13.7. It is been over a year since he has received a Lupron injection. He was restarted on Lupron at this appt.  4.9.2019--PSA 6.4. Lupron 30 mg administered  4.17.2019--bone scan negative for osseous metastatic disease.  4.26.2019--CT A/P negative for metastatic disease.  8.13.2019: No blood in urine/stool. No bony pain. Wt stable. PSA 5.5. He was given a 4 month Lupron injection.  12.17.2019: Lupron not given. PSA 6.2  4.28.2020: Most recent PSA 6.2 (2.8.2020) .   8.25.2020: PSA 20.4. He has been having some dysuria intermittently but not currently -- though he has been having increased frequency. He denies blood per urine. He denies any new bony pain. He does report having diminished gustatory sense. He uses diapers to manage incontinence/leakage.   10.27.2020: Recent CT A/P, bone scan--NED. He reports stable urinary sx's that he is tolerating well. He feels like he is emptying fully. He denies any blood per urine or dysuria.    12/05/18 06/06/18 04/04/18 11/22/17 07/26/17 03/17/17 11/03/16 07/02/16  PSA  Total PSA 20.4 ng/dl 6.2 ng/dl 6.2 ng/dl 5.5 ng/dl 6.4 ng/dl 13.7 ng/dl 4.1 ng/dl 3.9 ng/dl    12/05/18 06/06/18 04/04/18 03/17/17 11/03/16 07/02/16 03/09/16 10/15/15  Hormones  Testosterone, Total 60 pg/dL 18 pg/dL 23 pg/dL 88 pg/dL 24 pg/dL 25 pg/dL 43 pg/dL 33 pg/dL    Past  Medical History:  Diagnosis Date  . Cancer ([place])   . Hypertension   . Kidney calculus 2014;2013  . Pneumonia   . Shortness of breath     Past Surgical History:  Procedure Laterality Date  . CATARACT EXTRACTION W/PHACO Right 02/13/2013   Procedure: CATARACT EXTRACTION RIGHT EYE (WITH PHACO) AND INTRAOCULAR LENS PLACEMENT  CDE=14.53;  Surgeon: Elta Guadeloupe T. Gershon Crane, MD;  Location: [facility];  Service: Ophthalmology;  Laterality: Right;  . CATARACT EXTRACTION  W/PHACO Left 02/27/2013   Procedure: CATARACT EXTRACTION PHACO AND INTRAOCULAR LENS PLACEMENT (IOC);  Surgeon: Elta Guadeloupe T. Gershon Crane, MD;  Location: [facility];  Service: Ophthalmology;  Laterality: Left;  CDE:12.54  . COLONOSCOPY    . JOINT REPLACEMENT Right 1993;1980   Right x2    Home Medications:  Allergies as of 07/24/2019      Reactions   Codeine Swelling   Penicillins Swelling      Medication List       Accurate as of July 24, 2019  1:34 PM. If you have any questions, ask your nurse or doctor.        alendronate 70 MG tablet Commonly known as: Fosamax Take 1 tablet (70 mg total) by mouth once a week. Take with a full glass of water on an empty stomach.   amLODipine-benazepril 5-10 MG capsule Commonly known as: LOTREL Take 1 capsule by mouth daily.   aspirin EC 81 MG tablet Take 81 mg by mouth daily.   benazepril 10 MG tablet Commonly known as: LOTENSIN Take 10 mg by mouth daily.   dorzolamide-timolol 22.3-6.8 MG/ML ophthalmic solution Commonly known as: COSOPT Place 1 drop into both eyes 2 (two) times daily.   fish oil-omega-3 fatty acids 1000 MG capsule Take 1 g by mouth daily.   furosemide 20 MG tablet Commonly known as: LASIX Take 20-40 mg by mouth 2 (two) times daily.   hydrochlorothiazide 25 MG tablet Commonly known as: HYDRODIURIL Take 25 mg by mouth daily.   latanoprost 0.005 % ophthalmic solution Commonly known as: XALATAN Place 1 drop into both eyes at bedtime.   PRESCRIPTION MEDICATION Patient gets a prostate cancer shot every quarter at Dr. Silvano Rusk office.   tamsulosin 0.4 MG Caps capsule Commonly known as: FLOMAX Take 0.4 mg by mouth daily after supper.       Allergies:  Allergies  Allergen Reactions  . Codeine Swelling  . Penicillins Swelling    History reviewed. No pertinent family history.  Social History:  reports that he quit smoking about 18 years ago. His smoking use included cigarettes. He quit after 15.00 years of use. He does  not have any smokeless tobacco history on file. He reports that he does not drink alcohol or use drugs.  ROS: A complete review of systems was performed.  All systems are negative except for pertinent findings as noted.  Physical Exam:  Vital signs in last 24 hours: BP 132/60   Pulse 68   Temp (!) 97 F (36.1 C)   Ht 5\' 6"  (1.676 m)   Wt 219 lb (99.3 kg)   BMI 35.35 kg/m  Constitutional:  Alert and oriented, No acute distress Cardiovascular: Regular rate  Respiratory: Extremely labored after standing for exam GI: Abdomen is soft, nontender, nondistended, no abdominal masses. No CVAT. No hernias. Genitourinary: Normal male phallus (ucircumcised), testes are descended bilaterally and non-tender and without masses (bilateral atrophy), scrotum is normal in appearance without lesions or masses, perineum is normal on inspection. Prostate is 20 grams in size,  firm bilaterally.  Lymphatic: No lymphadenopathy Neurologic: Grossly intact, no focal deficits Psychiatric: Normal mood and affect   I have reviewed prior pt notes  I have reviewed notes from referring/previous physicians  I have independently reviewed prior imaging  I have reviewed prior PSA results   Impression/Assessment:  He is due a repeat PSA today. No voiding complaints. DRE today normal for him having gone through radiotherapy. He is fine to remain off ADT.  Plan:  1. PSA today -- will forward results.  2. Return in 6 mo for follow-up and labs.   3. His heart rate is quite low and I recommended that he go be seen in ER if he continues to feel weak.

## 2019-07-24 NOTE — ED Triage Notes (Signed)
PT was sent to ED for eval due to tachypnea during his [facility] exam at the office today and increased swelling to bilateral lower extremities. PT denies any SOB or chest pain upon arrival to ED.

## 2019-07-24 NOTE — Progress Notes (Signed)
Urological Symptom Review  Patient is experiencing the following symptoms: Frequent urination Hard to postpone urination Get up at night to urinate Leakage of urine Stream starts and stops   Review of Systems  Gastrointestinal (upper)  : Negative for upper GI symptoms  Gastrointestinal (lower) : Constipation  Constitutional : Fatigue  Skin: Negative for skin symptoms  Eyes: Negative for eye symptoms  Ear/Nose/Throat : Negative for Ear/Nose/Throat symptoms  Hematologic/Lymphatic: Negative for Hematologic/Lymphatic symptoms  Cardiovascular : Negative for cardiovascular symptoms  Respiratory : Shortness of breath  Endocrine: Negative for endocrine symptoms  Musculoskeletal: Joint pain  Neurological: Negative for neurological symptoms  Psychologic: Negative for psychiatric symptoms

## 2019-07-24 NOTE — ED Provider Notes (Signed)
[facility] EMERGENCY DEPARTMENT Provider Note   CSN: [id number] Arrival date & time: 07/24/19  1401     History Chief Complaint  Patient presents with  . Leg Swelling    Jesse Lucy Rossbach Sr. is a 86 y.o. male.  Patient is a 86 year old male who lives at home with his daughter with a history of hypertension, elevated PSA, lower extremity edema presenting to the emergency department for presyncopal episode.  Per daughter report, patient was at the urology office prior to arrival and had an episode where he felt weak and short of breath and was advised if this continues he should come to the emergency department.  Patient reports that he feels his normal self and does not specifically have any complaints at all.  Patient reports he has been started on a new fluid pill which she has been taking regularly.  He specifically denies any chest pain, shortness of breath, weakness, syncope        Past Medical History:  Diagnosis Date  . Cancer ([place])   . Hypertension   . Kidney calculus 2014;2013  . Pneumonia   . Shortness of breath     There are no problems to display for this patient.   Past Surgical History:  Procedure Laterality Date  . CATARACT EXTRACTION W/PHACO Right 02/13/2013   Procedure: CATARACT EXTRACTION RIGHT EYE (WITH PHACO) AND INTRAOCULAR LENS PLACEMENT  CDE=14.53;  Surgeon: Elta Guadeloupe T. Gershon Crane, MD;  Location: [facility];  Service: Ophthalmology;  Laterality: Right;  . CATARACT EXTRACTION W/PHACO Left 02/27/2013   Procedure: CATARACT EXTRACTION PHACO AND INTRAOCULAR LENS PLACEMENT (IOC);  Surgeon: Elta Guadeloupe T. Gershon Crane, MD;  Location: [facility];  Service: Ophthalmology;  Laterality: Left;  CDE:12.54  . COLONOSCOPY    . JOINT REPLACEMENT Right 1993;1980   Right x2       History reviewed. No pertinent family history.  Social History   Tobacco Use  . Smoking status: Never Smoker  . Smokeless tobacco: Never Used  Substance Use Topics  . Alcohol use: No  . Drug use: No    Home  Medications Prior to Admission medications   Medication Sig Start Date End Date Taking? Authorizing Provider  alendronate (FOSAMAX) 70 MG tablet Take 1 tablet (70 mg total) by mouth once a week. Take with a full glass of water on an empty stomach. Patient taking differently: Take 70 mg by mouth every Saturday. Take with a full glass of water on an empty stomach. 06/22/19  Yes Franchot Gallo, MD  aspirin EC 81 MG tablet Take 81 mg by mouth daily.   Yes [provider]  benazepril (LOTENSIN) 10 MG tablet Take 10 mg by mouth daily. 05/15/19  Yes [provider]  dorzolamide-timolol (COSOPT) 22.3-6.8 MG/ML ophthalmic solution Place 1 drop into both eyes 2 (two) times daily.   Yes [provider]  fish oil-omega-3 fatty acids 1000 MG capsule Take 1 g by mouth daily.   Yes [provider]  furosemide (LASIX) 20 MG tablet Take 20-40 mg by mouth daily as needed for fluid.  07/09/19  Yes [provider]  hydrochlorothiazide (HYDRODIURIL) 25 MG tablet Take 25 mg by mouth daily.   Yes [provider]  latanoprost (XALATAN) 0.005 % ophthalmic solution Place 1 drop into both eyes at bedtime.   Yes [provider]  tamsulosin (FLOMAX) 0.4 MG CAPS capsule Take 0.4 mg by mouth daily after supper.   Yes [provider]    Allergies    Codeine and  Penicillins  Review of Systems   Review of Systems  Constitutional: Negative for appetite change, chills, fatigue and fever.  HENT: Negative for congestion, rhinorrhea and sore throat.   Eyes: Negative for visual disturbance.  Respiratory: Negative for cough, shortness of breath and wheezing.   Cardiovascular: Negative for chest pain.  Gastrointestinal: Negative for abdominal pain, nausea and vomiting.  Genitourinary: Negative for dysuria.  Musculoskeletal: Negative for back pain.  Skin: Negative for rash.  Allergic/Immunologic: Negative for immunocompromised state.  Neurological: Negative  for dizziness, light-headedness and headaches.  Hematological: Does not bruise/bleed easily.    Physical Exam Updated Vital Signs BP (!) 163/74 (BP Location: Right Arm)   Pulse 69   Temp (!) 97.5 F (36.4 C) (Oral)   Resp 20   Ht 5\' 6"  (1.676 m)   Wt 99.3 kg   SpO2 100%   BMI 35.35 kg/m   Physical Exam Vitals and nursing note reviewed.  Constitutional:      General: He is not in acute distress.    Appearance: Normal appearance. He is obese. He is not ill-appearing, toxic-appearing or diaphoretic.  HENT:     Head: Normocephalic and atraumatic.     Mouth/Throat:     Mouth: Mucous membranes are moist.  Eyes:     Conjunctiva/sclera: Conjunctivae normal.  Cardiovascular:     Rate and Rhythm: Normal rate. Rhythm irregular.     Pulses: Normal pulses.  Pulmonary:     Effort: Pulmonary effort is normal.     Breath sounds: Normal breath sounds. No wheezing or rhonchi.  Abdominal:     General: Bowel sounds are normal. There is distension.     Palpations: There is no mass.     Tenderness: There is no abdominal tenderness. There is no right CVA tenderness, left CVA tenderness or guarding.     Hernia: No hernia is present.  Musculoskeletal:        General: No tenderness.     Right lower leg: Edema present.     Left lower leg: Edema present.     Comments: 1-2+ bilateral lower extremity pitting edema.  Skin:    General: Skin is dry.     Capillary Refill: Capillary refill takes less than 2 seconds.  Neurological:     Mental Status: He is alert.  Psychiatric:        Mood and Affect: Mood normal.     ED Results / Procedures / Treatments   Labs (all labs ordered are listed, but only abnormal results are displayed) Labs Reviewed  BASIC METABOLIC PANEL - Abnormal; Notable for the following components:      Result Value   BUN 32 (*)    Creatinine, Ser 1.33 (*)    GFR calc non Af Amer 47 (*)    GFR calc Af Amer 55 (*)    All other components within normal limits  BRAIN  NATRIURETIC PEPTIDE - Abnormal; Notable for the following components:   B Natriuretic Peptide 142.0 (*)    All other components within normal limits  CBC WITH DIFFERENTIAL/PLATELET - Abnormal; Notable for the following components:   RBC 3.62 (*)    Hemoglobin 11.6 (*)    HCT 36.8 (*)    MCV 101.7 (*)    All other components within normal limits  TROPONIN I (HIGH SENSITIVITY) - Abnormal; Notable for the following components:   Troponin I (High Sensitivity) 18 (*)    All other components within normal limits  HEPATIC FUNCTION PANEL  TROPONIN I (  HIGH SENSITIVITY)    EKG None  Radiology DG Chest Port 1 View  Result Date: 07/24/2019 CLINICAL DATA:  Shortness of breath. EXAM: PORTABLE CHEST 1 VIEW COMPARISON:  November 25, 2014. FINDINGS: Stable cardiomegaly. No pneumothorax or pleural effusion is noted. Both lungs are clear. The visualized skeletal structures are unremarkable. IMPRESSION: No acute cardiopulmonary abnormality seen. Electronically Signed   By: Marijo Conception M.D.   On: 07/24/2019 14:52    Procedures Procedures (including critical care time)  Medications Ordered in ED Medications - No data to display  ED Course  I have reviewed the triage vital signs and the nursing notes.  Pertinent labs & imaging results that were available during my care of the patient were reviewed by me and considered in my medical decision making (see chart for details).  Clinical Course as of Jul 24 1714  Tue Jul 24, 2019  1532 Elderly patient from home presenting with pre-syncopal episode while being examined at urology office prior to arrival. Well appearing without complaints on arrival to ED here. Mildly hypertensive with otherwise nromal vitals and EKG showing 1st degree heartblock and ventricular bigeminy. Patient with 1st degree heart block dating as far back as what we can see as 2014.    [KM]  [id number] Patient seen by Dr. Sedonia Small and discussed with him. Workup overall reassuring. Discussed  outpatient f/u vs obrservation/ admission with daughter and patient and both adamantly would like to go home. Discussed findings to include very mildly elevated creatinine to 1.33, BNP of 142 and mild anemia with hemoglobin of 11.3. Unsure if this is acute or chronic but patient is not hypotensive and him and daughter deny ant melena, hematochezia, hematemesis, hemoptysis. Return precautions discussed   [KM]    Clinical Course User Index [KM] Kristine Royal   MDM Rules/Calculators/A&P                      Based on review of vitals, medical screening exam, lab work and/or imaging, there does not appear to be an acute, emergent etiology for the patient's symptoms. Counseled pt on good return precautions and encouraged both PCP and ED follow-up as needed.  Prior to discharge, I also discussed incidental imaging findings with patient in detail and advised appropriate, recommended follow-up in detail.  Clinical Impression: 1. Peripheral edema   2. SOB (shortness of breath)     Disposition: Discharge  Prior to providing a prescription for a controlled substance, I independently reviewed the patient's recent prescription history on the [place]. The patient had no recent or regular prescriptions and was deemed appropriate for a brief, less than 3 day prescription of narcotic for acute analgesia.  This note was prepared with assistance of Systems analyst. Occasional wrong-word or sound-a-like substitutions may have occurred due to the inherent limitations of voice recognition software.  Final Clinical Impression(s) / ED Diagnoses Final diagnoses:  Peripheral edema  SOB (shortness of breath)    Rx / DC Orders ED Discharge Orders    None       Kristine Royal 07/24/19 1716    Maudie Flakes, MD 07/25/19 0900

## 2019-07-24 NOTE — Progress Notes (Signed)
CSW completed SDOH assessments with patient and patients Daughter.   [place] Transitions of Care  Clinical Social Worker  Ph: [phone]

## 2019-08-07 ENCOUNTER — Telehealth: Payer: Self-pay

## 2019-08-07 NOTE — Telephone Encounter (Signed)
-----   Message from Franchot Gallo, MD sent at 08/07/2019 12:02 PM EDT ----- Notify patient that his PSA is now up to 42.  I think he needs to come back in to restart Lupron/Eligard, 30 mg. ----- Message ----- From: Dorisann Frames, RN Sent: 07/25/2019  12:37 PM EDT To: Franchot Gallo, MD  Please review

## 2019-08-07 NOTE — Telephone Encounter (Signed)
Called pt. Left message to return call.

## 2019-09-11 ENCOUNTER — Ambulatory Visit (INDEPENDENT_AMBULATORY_CARE_PROVIDER_SITE_OTHER): Payer: Medicare Other | Admitting: Urology

## 2019-09-11 ENCOUNTER — Encounter: Payer: Self-pay | Admitting: Urology

## 2019-09-11 ENCOUNTER — Other Ambulatory Visit: Payer: Self-pay

## 2019-09-11 VITALS — BP 124/72 | HR 98 | Temp 97.9°F | Ht 67.0 in | Wt 215.0 lb

## 2019-09-11 DIAGNOSIS — C61 Malignant neoplasm of prostate: Secondary | ICD-10-CM | POA: Diagnosis not present

## 2019-09-11 MED ORDER — BICALUTAMIDE 50 MG PO TABS: 50.0000 mg | ORAL_TABLET | Freq: Every day | ORAL | 0 refills | Status: DC

## 2019-09-11 MED ORDER — LEUPROLIDE ACETATE (4 MONTH) 30 MG ~~LOC~~ KIT
30.0000 mg | PACK | Freq: Once | SUBCUTANEOUS | Status: AC
  Administered 2019-09-11: 30 mg via SUBCUTANEOUS

## 2019-09-11 NOTE — Progress Notes (Signed)
See note

## 2019-09-11 NOTE — Progress Notes (Signed)
Eligard SubQ Injection   Due to Prostate Cancer patient is present today for a Eligard Injection.  Medication: Eligard 4 month Dose: 30 mg  Location:  Left Lot: [id number] Exp: 09/22  Patient tolerated well, no complications were noted  Performed by: merchant,debra,lpn

## 2019-09-11 NOTE — Progress Notes (Signed)
H&P  Chief Complaint: Prostate Cancer  History of Present Illness:   5.25.2021: He returns today for f/u and to reinitiate ADT. This decision was based on recently elevated PSA, which was measured at 42.1 on 4.6.2021. ADT resumed with 4 mo eligard given today.   Past Medical History:  Diagnosis Date  . Cancer ([place])   . Hypertension   . Kidney calculus 2014;2013  . Pneumonia   . Shortness of breath     Past Surgical History:  Procedure Laterality Date  . CATARACT EXTRACTION W/PHACO Right 02/13/2013   Procedure: CATARACT EXTRACTION RIGHT EYE (WITH PHACO) AND INTRAOCULAR LENS PLACEMENT  CDE=14.53;  Surgeon: Elta Guadeloupe T. Gershon Crane, MD;  Location: [facility];  Service: Ophthalmology;  Laterality: Right;  . CATARACT EXTRACTION W/PHACO Left 02/27/2013   Procedure: CATARACT EXTRACTION PHACO AND INTRAOCULAR LENS PLACEMENT (IOC);  Surgeon: Elta Guadeloupe T. Gershon Crane, MD;  Location: [facility];  Service: Ophthalmology;  Laterality: Left;  CDE:12.54  . COLONOSCOPY    . JOINT REPLACEMENT Right 1993;1980   Right x2    Home Medications:  Allergies as of 09/11/2019      Reactions   Codeine Swelling   Penicillins Swelling      Medication List       Accurate as of Sep 11, 2019  3:02 PM. If you have any questions, ask your nurse or doctor.        alendronate 70 MG tablet Commonly known as: Fosamax Take 1 tablet (70 mg total) by mouth once a week. Take with a full glass of water on an empty stomach. What changed: when to take this   aspirin EC 81 MG tablet Take 81 mg by mouth daily.   benazepril 10 MG tablet Commonly known as: LOTENSIN Take 10 mg by mouth daily.   dorzolamide-timolol 22.3-6.8 MG/ML ophthalmic solution Commonly known as: COSOPT Place 1 drop into both eyes 2 (two) times daily.   fish oil-omega-3 fatty acids 1000 MG capsule Take 1 g by mouth daily.   furosemide 20 MG tablet Commonly known as: LASIX Take 20-40 mg by mouth daily as needed for fluid.   hydrochlorothiazide 25 MG  tablet Commonly known as: HYDRODIURIL Take 25 mg by mouth daily.   latanoprost 0.005 % ophthalmic solution Commonly known as: XALATAN Place 1 drop into both eyes at bedtime.   tamsulosin 0.4 MG Caps capsule Commonly known as: FLOMAX Take 0.4 mg by mouth daily after supper.       Allergies:  Allergies  Allergen Reactions  . Codeine Swelling  . Penicillins Swelling    No family history on file.  Social History:  reports that he has never smoked. He has never used smokeless tobacco. He reports that he does not drink alcohol or use drugs.  ROS: Urological Symptom Review  Patient is experiencing the following symptoms: None Review of Systems Gastrointestinal (upper)  : Negative for upper GI symptoms Gastrointestinal (lower) : Negative for lower GI symptoms Constitutional : Negative for symptoms Skin: Negative for skin symptoms Eyes: Negative for eye symptoms Ear/Nose/Throat : Negative for Ear/Nose/Throat symptoms Hematologic/Lymphatic: Negative for Hematologic/Lymphatic symptoms Cardiovascular : Negative for cardiovascular symptoms Respiratory : Negative for respiratory symptoms Endocrine: Negative for endocrine symptoms Musculoskeletal: Negative for musculoskeletal symptoms Neurological: Negative for neurological symptoms Psychologic: Negative for psychiatric symptoms  Physical Exam:  Vital signs in last 24 hours: There were no vitals taken for this visit. Constitutional:  Alert and oriented, No acute distress, Using walker. Cardiovascular: Regular rate  Respiratory: Short of breath. Genitourinary: Not  completed Neurologic: Grossly intact, no focal deficits Psychiatric: Normal mood and affect  I have reviewed prior pt notes  I have reviewed notes from referring/previous physicians  I have reviewed urinalysis results  I have independently reviewed prior imaging  I have reviewed prior PSA results  Impression/Assessment:  Given his drastically  elevated PSA, I think it would be appropriate to resume ADT with a 4 mo eligard today.   Plan:  1. 4 mo eligard given today; start on 3 wk course of bicalutamide.   2. Continue on alendronate.  3. Return for OV in 4 mo w/ 4 mo injection

## 2019-11-14 DIAGNOSIS — M79674 Pain in right toe(s): Secondary | ICD-10-CM | POA: Diagnosis not present

## 2019-11-14 DIAGNOSIS — M79672 Pain in left foot: Secondary | ICD-10-CM | POA: Diagnosis not present

## 2019-11-14 DIAGNOSIS — I739 Peripheral vascular disease, unspecified: Secondary | ICD-10-CM | POA: Diagnosis not present

## 2019-11-14 DIAGNOSIS — M79675 Pain in left toe(s): Secondary | ICD-10-CM | POA: Diagnosis not present

## 2019-11-14 DIAGNOSIS — M79671 Pain in right foot: Secondary | ICD-10-CM | POA: Diagnosis not present

## 2019-12-06 ENCOUNTER — Other Ambulatory Visit: Payer: Self-pay

## 2019-12-06 DIAGNOSIS — C61 Malignant neoplasm of prostate: Secondary | ICD-10-CM

## 2020-01-11 ENCOUNTER — Other Ambulatory Visit: Payer: Self-pay

## 2020-01-11 ENCOUNTER — Other Ambulatory Visit: Payer: Medicare Other

## 2020-01-11 DIAGNOSIS — C61 Malignant neoplasm of prostate: Secondary | ICD-10-CM | POA: Diagnosis not present

## 2020-01-12 LAB — PSA: Prostate Specific Ag, Serum: 6.4 ng/mL — ABNORMAL HIGH (ref 0.0–4.0)

## 2020-01-12 LAB — TESTOSTERONE: Testosterone: 7 ng/dL — ABNORMAL LOW (ref 264–916)

## 2020-01-15 ENCOUNTER — Other Ambulatory Visit: Payer: Self-pay

## 2020-01-15 ENCOUNTER — Encounter: Payer: Self-pay | Admitting: Urology

## 2020-01-15 ENCOUNTER — Ambulatory Visit (INDEPENDENT_AMBULATORY_CARE_PROVIDER_SITE_OTHER): Payer: Medicare Other | Admitting: Urology

## 2020-01-15 VITALS — BP 152/78 | HR 79 | Temp 98.5°F | Ht 67.0 in | Wt 215.0 lb

## 2020-01-15 DIAGNOSIS — M818 Other osteoporosis without current pathological fracture: Secondary | ICD-10-CM

## 2020-01-15 DIAGNOSIS — C61 Malignant neoplasm of prostate: Secondary | ICD-10-CM | POA: Diagnosis not present

## 2020-01-15 MED ORDER — LEUPROLIDE ACETATE (4 MONTH) 30 MG ~~LOC~~ KIT
30.0000 mg | PACK | Freq: Once | SUBCUTANEOUS | Status: AC
  Administered 2020-01-15: 30 mg via SUBCUTANEOUS

## 2020-01-15 NOTE — Progress Notes (Signed)
Urological Symptom Review  Patient is experiencing the following symptoms: Frequent urination Get up at night to urinate Leakage of urine Stream starts and stops Weak stream   Review of Systems  Gastrointestinal (upper)  : Negative for upper GI symptoms  Gastrointestinal (lower) : Negative for lower GI symptoms  Constitutional : Negative for symptoms  Skin: Negative for skin symptoms  Eyes: Negative for eye symptoms  Ear/Nose/Throat : Negative for Ear/Nose/Throat symptoms  Hematologic/Lymphatic: Negative for Hematologic/Lymphatic symptoms  Cardiovascular : Negative for cardiovascular symptoms  Respiratory : Negative for respiratory symptoms  Endocrine: Negative for endocrine symptoms  Musculoskeletal: Negative for musculoskeletal symptoms  Neurological: Negative for neurological symptoms  Psychologic: Negative for psychiatric symptoms  

## 2020-01-15 NOTE — Progress Notes (Signed)
H&P  Chief Complaint: F/U of PCa  History of Present Illness:  9.28.2021: PSA - 6.4 Pt here for F/U of PCa. Pt is experiencing increased urinary frequency and nocturia, but notes that other urinary symptomatology has remained stable. Pt does not perceive increased urinary frequency associated with furosemide. Pt notes no new aches or pains.  Pt currently supplementing his diet with fish oil, but discontinued iron supplements due to darkened stool  IPSS Questionnaire (AUA-7): Over the past month.   1)  How often have you had a sensation of not emptying your bladder completely after you finish urinating?  4 - More than half the time  2)  How often have you had to urinate again less than two hours after you finished urinating? 4 - More than half the time  3)  How often have you found you stopped and started again several times when you urinated?  4 - More than half the time  4) How difficult have you found it to postpone urination?  4 - More than half the time  5) How often have you had a weak urinary stream?  4 - More than half the time  6) How often have you had to push or strain to begin urination?  0 - Not at all  7) How many times did you most typically get up to urinate from the time you went to bed until the time you got up in the morning?  4 - 4 times  Total score:  0-7 mildly symptomatic   8-19 moderately symptomatic   20-35 severely symptomatic  QOL score: 5   (below copied from [facility] records):  10.27.2020: Numair Masden is a 86 year-old male established patient who is here for prostate cancer which has been treated. He did receive radiation therapy for his cancer. He was treated with xrt for his cancer. Patient denies brachytherapy and high dose radiation. His radiation treatment was complete 10/14/2003. He has undergone Hormonal Therapy for treatment.  He had EBRT for PCA in 2003--unknown stage/grade. When first seen in 2015, he had been on androgen deprivation therapy with Lupron  and his PSA was 2.65. Before his first meeting, he was cared for by Dr. Michela Pitcher.  After his first visit, in Feb 2016 he had a bone scan which revealed no evidence of osseous metastatic disease. Bone density study did reveal osteoporosis. He was placed on alendronate 70 mg weekly.  His PSA in Feb 2016 was 2.70 (approximately what it was previously iin October, 2015). Testosterone levels were at castrate level. He had a repeat PSA in May, 2016 that was 2.9.  Because of increasing PSA (16 on 3.1.2017), he was restarted on androgen deprivation therapy in March, 2017. At that time, bone scan was negative for osseous metastatic disease.  In July, 2017 was down to 4.6.  CT scan in Nov 2017 revealed stable pulmonary nodules, no evidence of intra-abdominal or skeletal metastatic disease.   12.4.2018: He is having some shoulder but no other bony pain. Most recent PSA is up to 13.7. It is been over a year since he has received a Lupron injection. He was restarted on Lupron at this appt.   4.9.2019: PSA 6.4. Lupron 30 mg administered   4.17.2019: bone scan negative for osseous metastatic disease.   4.26.2019: CT A/P negative for metastatic disease.   8.13.2019: No blood in urine/stool. No bony pain. Wt stable. PSA 5.5. He was given a 4 month Lupron injection.   12.17.2019: Lupron not given.  PSA 6.2   4.28.2020: Most recent PSA 6.2 (2.8.2020) .   8.25.2020: PSA 20.4. He has been having some dysuria intermittently but not currently -- though he has been having increased frequency. He denies blood per urine. He denies any new bony pain. He does report having diminished gustatory sense. He uses diapers to manage incontinence/leakage.   10.27.2020: Recent CT A/P, bone scan--NED. He reports stable urinary sx's that he is tolerating well. He feels like he is emptying fully. He denies any blood per urine or dysuria.   5.25.2021: He returns today for f/u and to reinitiate ADT. This decision was based on recently  elevated PSA, which was measured at 42.1 on 4.6.2021. ADT resumed with 4 mo eligard given today.   Past Medical History:  Diagnosis Date  . Cancer ([place])   . Hypertension   . Kidney calculus 2014;2013  . Pneumonia   . Shortness of breath     Past Surgical History:  Procedure Laterality Date  . CATARACT EXTRACTION W/PHACO Right 02/13/2013   Procedure: CATARACT EXTRACTION RIGHT EYE (WITH PHACO) AND INTRAOCULAR LENS PLACEMENT  CDE=14.53;  Surgeon: Elta Guadeloupe T. Gershon Crane, MD;  Location: [facility];  Service: Ophthalmology;  Laterality: Right;  . CATARACT EXTRACTION W/PHACO Left 02/27/2013   Procedure: CATARACT EXTRACTION PHACO AND INTRAOCULAR LENS PLACEMENT (IOC);  Surgeon: Elta Guadeloupe T. Gershon Crane, MD;  Location: [facility];  Service: Ophthalmology;  Laterality: Left;  CDE:12.54  . COLONOSCOPY    . JOINT REPLACEMENT Right 1993;1980   Right x2    Home Medications:  Allergies as of 01/15/2020      Reactions   Codeine Swelling   Penicillins Swelling      Medication List       Accurate as of January 15, 2020  1:42 PM. If you have any questions, ask your nurse or doctor.        alendronate 70 MG tablet Commonly known as: Fosamax Take 1 tablet (70 mg total) by mouth once a week. Take with a full glass of water on an empty stomach. What changed: when to take this   aspirin EC 81 MG tablet Take 81 mg by mouth daily.   benazepril 10 MG tablet Commonly known as: LOTENSIN Take 10 mg by mouth daily.   bicalutamide 50 MG tablet Commonly known as: Casodex Take 1 tablet (50 mg total) by mouth daily.   dorzolamide-timolol 22.3-6.8 MG/ML ophthalmic solution Commonly known as: COSOPT Place 1 drop into both eyes 2 (two) times daily.   dorzolamide-timolol 22.3-6.8 MG/ML ophthalmic solution Commonly known as: COSOPT INSTILL 1 DROP INTO BOTH EYES TWICE DAILY   fish oil-omega-3 fatty acids 1000 MG capsule Take 1 g by mouth daily.   furosemide 20 MG tablet Commonly known as: LASIX Take 20-40 mg by  mouth daily as needed for fluid.   hydrochlorothiazide 25 MG tablet Commonly known as: HYDRODIURIL Take 25 mg by mouth daily.   latanoprost 0.005 % ophthalmic solution Commonly known as: XALATAN Place 1 drop into both eyes at bedtime.   latanoprost 0.005 % ophthalmic solution Commonly known as: XALATAN INSTILL 1 DROP INTO BOTH EYES NIGHTLY   tamsulosin 0.4 MG Caps capsule Commonly known as: FLOMAX Take 0.4 mg by mouth daily after supper.       Allergies:  Allergies  Allergen Reactions  . Codeine Swelling  . Penicillins Swelling    No family history on file.  Social History:  reports that he has never smoked. He has never used smokeless tobacco. He reports that  he does not drink alcohol and does not use drugs.  ROS: A complete review of systems was performed.  All systems are negative except for pertinent findings as noted.  Physical Exam:  Vital signs in last 24 hours: There were no vitals taken for this visit. Constitutional:  Alert and oriented, No acute distress. Peripheral edema Respiratory: Normal respiratory effort Neurologic: Grossly intact, no focal deficits Psychiatric: Normal mood and affect  Laboratory Data:  No results for input(s): WBC, HGB, HCT, PLT in the last 72 hours.  No results for input(s): NA, K, CL, GLUCOSE, BUN, CALCIUM, CREATININE in the last 72 hours.  Invalid input(s): CO3   No results found for this or any previous visit (from the past 24 hour(s)). No results found for this or any previous visit (from the past 240 hour(s)).  Renal Function: No results for input(s): CREATININE in the last 168 hours. CrCl cannot be calculated (Patient's most recent lab result is older than the maximum 21 days allowed.).  Radiologic Imaging: No results found.  Impression/Assessment:  PCa - Pt PSA levels responded well to ADT therapy  Plan:  1. Pt advised to supplement his diet with Calcium and Vitamin D.  2. Pt advised to take furosemide 40mg   in the am to reduce peripheral edema and instance of nocturia.  3. Pt continued on Fosamax and given 4 month Eligard today.  3. F/U in 4 months for OV and Eligard.  CC: Dr. Redmond School

## 2020-04-18 DIAGNOSIS — Z720 Tobacco use: Secondary | ICD-10-CM | POA: Diagnosis not present

## 2020-04-18 DIAGNOSIS — E7849 Other hyperlipidemia: Secondary | ICD-10-CM | POA: Diagnosis not present

## 2020-04-18 DIAGNOSIS — E1165 Type 2 diabetes mellitus with hyperglycemia: Secondary | ICD-10-CM | POA: Diagnosis not present

## 2020-04-18 DIAGNOSIS — I2510 Atherosclerotic heart disease of native coronary artery without angina pectoris: Secondary | ICD-10-CM | POA: Diagnosis not present

## 2020-05-15 ENCOUNTER — Other Ambulatory Visit: Payer: Medicare Other

## 2020-05-16 ENCOUNTER — Other Ambulatory Visit: Payer: Medicare Other

## 2020-05-16 ENCOUNTER — Other Ambulatory Visit: Payer: Self-pay

## 2020-05-16 DIAGNOSIS — C61 Malignant neoplasm of prostate: Secondary | ICD-10-CM

## 2020-05-16 DIAGNOSIS — Z23 Encounter for immunization: Secondary | ICD-10-CM | POA: Diagnosis not present

## 2020-05-17 LAB — TESTOSTERONE: Testosterone: 13 ng/dL — ABNORMAL LOW (ref 264–916)

## 2020-05-17 LAB — PSA: Prostate Specific Ag, Serum: 7.7 ng/mL — ABNORMAL HIGH (ref 0.0–4.0)

## 2020-05-20 ENCOUNTER — Encounter: Payer: Self-pay | Admitting: Urology

## 2020-05-20 ENCOUNTER — Ambulatory Visit (INDEPENDENT_AMBULATORY_CARE_PROVIDER_SITE_OTHER): Payer: Medicare Other | Admitting: Urology

## 2020-05-20 ENCOUNTER — Other Ambulatory Visit: Payer: Self-pay

## 2020-05-20 VITALS — BP 139/74 | HR 98 | Temp 97.8°F | Ht 67.0 in | Wt 215.0 lb

## 2020-05-20 DIAGNOSIS — C61 Malignant neoplasm of prostate: Secondary | ICD-10-CM | POA: Diagnosis not present

## 2020-05-20 DIAGNOSIS — R9721 Rising PSA following treatment for malignant neoplasm of prostate: Secondary | ICD-10-CM

## 2020-05-20 DIAGNOSIS — M818 Other osteoporosis without current pathological fracture: Secondary | ICD-10-CM

## 2020-05-20 MED ORDER — LEUPROLIDE ACETATE (4 MONTH) 30 MG ~~LOC~~ KIT
30.0000 mg | PACK | Freq: Once | SUBCUTANEOUS | Status: AC
  Administered 2020-05-20: 30 mg via SUBCUTANEOUS

## 2020-05-20 NOTE — Progress Notes (Signed)
H&P  Chief Complaint: PCa  History of Present Illness:  2.1.2022: PSA - 7.7 [name] is here for F/U of his PCa. He is experiencing urinary frequency and urgency but he denies any gross hematuria or recent dysuria. He wears diapers for incontinence and will use approximately 1 per day. He continues on tamsulosin and denies any associated dizziness upon standing.  (below copied from [facility] records):  10.27.2020: [name] is a 87 year-old male established patient who is here for prostate cancer which has been treated. He did receive radiation therapy for his cancer. He was treated with xrt for his cancer. Patient denies brachytherapy and high dose radiation. His radiation treatment was complete 10/14/2003. He has undergone Hormonal Therapy for treatment.  He had EBRT for PCA in 2003--unknown stage/grade. When first seen in 2015, he had been on androgen deprivation therapy with Lupron and his PSA was 2.65. Before his first meeting, he was cared for by Dr. Michela Pitcher.  After his first visit, in Feb 2016 he had a bone scan which revealed no evidence of osseous metastatic disease. Bone density study did reveal osteoporosis. He was placed on alendronate 70 mg weekly.  His PSA in Feb 2016 was 2.70 (approximately what it was previously iin October, 2015). Testosterone levels were at castrate level. He had a repeat PSA in May, 2016 that was 2.9.  Because of increasing PSA (16 on 3.1.2017), he was restarted on androgen deprivation therapy in March, 2017. At that time, bone scan was negative for osseous metastatic disease.  In July, 2017 was down to 4.6.  CT scan in Nov 2017 revealed stable pulmonary nodules, no evidence of intra-abdominal or skeletal metastatic disease.   12.4.2018: He is having some shoulder but no other bony pain. Most recent PSA is up to 13.7. It is been over a year since he has received a Lupron injection. He was restarted on Lupron at this appt.   4.9.2019: PSA 6.4. Lupron 30 mg administered    4.17.2019: bone scan negative for osseous metastatic disease.   4.26.2019: CT A/P negative for metastatic disease.   8.13.2019: No blood in urine/stool. No bony pain. Wt stable. PSA 5.5. He was given a 4 month Lupron injection.   12.17.2019: Lupron not given. PSA 6.2   4.28.2020: Most recent PSA 6.2 (2.8.2020) .   8.25.2020: PSA 20.4. He has been having some dysuria intermittently but not currently -- though he has been having increased frequency. He denies blood per urine. He denies any new bony pain. He does report having diminished gustatory sense. He uses diapers to manage incontinence/leakage.   10.27.2020: Recent CT A/P, bone scan--NED. He reports stable urinary sx's that he is tolerating well. He feels like he is emptying fully. He denies any blood per urine or dysuria.   5.25.2021:He returns today for f/u and to reinitiate ADT. This decision was based on recently elevated PSA, which was measured at 42.1 on 4.6.2021.ADT resumed with 4 mo eligard given today.  9.28.2021: PSA - 6.4 Pt here for F/U of PCa. Pt is experiencing increased urinary frequency and nocturia, but notes that other urinary symptomatology has remained stable. Pt does not perceive increased urinary frequency associated with furosemide. Pt notes no new aches or pains.  Pt currently supplementing his diet with fish oil, but discontinued iron supplements due to darkened stool  Past Medical History:  Diagnosis Date  . Cancer ([place])   . Hypertension   . Kidney calculus 2014;2013  . Pneumonia   . Shortness of  breath     Past Surgical History:  Procedure Laterality Date  . CATARACT EXTRACTION W/PHACO Right 02/13/2013   Procedure: CATARACT EXTRACTION RIGHT EYE (WITH PHACO) AND INTRAOCULAR LENS PLACEMENT  CDE=14.53;  Surgeon: Elta Guadeloupe T. Gershon Crane, MD;  Location: [facility];  Service: Ophthalmology;  Laterality: Right;  . CATARACT EXTRACTION W/PHACO Left 02/27/2013   Procedure: CATARACT EXTRACTION PHACO AND  INTRAOCULAR LENS PLACEMENT (IOC);  Surgeon: Elta Guadeloupe T. Gershon Crane, MD;  Location: [facility];  Service: Ophthalmology;  Laterality: Left;  CDE:12.54  . COLONOSCOPY    . JOINT REPLACEMENT Right 1993;1980   Right x2    Home Medications:  Allergies as of 05/20/2020      Reactions   Codeine Swelling   Penicillins Swelling      Medication List       Accurate as of May 20, 2020 12:18 PM. If you have any questions, ask your nurse or doctor.        alendronate 70 MG tablet Commonly known as: Fosamax Take 1 tablet (70 mg total) by mouth once a week. Take with a full glass of water on an empty stomach. What changed: when to take this   aspirin EC 81 MG tablet Take 81 mg by mouth daily.   benazepril 10 MG tablet Commonly known as: LOTENSIN Take 10 mg by mouth daily.   bicalutamide 50 MG tablet Commonly known as: Casodex Take 1 tablet (50 mg total) by mouth daily.   dorzolamide-timolol 22.3-6.8 MG/ML ophthalmic solution Commonly known as: COSOPT Place 1 drop into both eyes 2 (two) times daily.   dorzolamide-timolol 22.3-6.8 MG/ML ophthalmic solution Commonly known as: COSOPT INSTILL 1 DROP INTO BOTH EYES TWICE DAILY   fish oil-omega-3 fatty acids 1000 MG capsule Take 1 g by mouth daily.   furosemide 20 MG tablet Commonly known as: LASIX Take 20-40 mg by mouth daily as needed for fluid.   hydrochlorothiazide 25 MG tablet Commonly known as: HYDRODIURIL Take 25 mg by mouth daily.   latanoprost 0.005 % ophthalmic solution Commonly known as: XALATAN Place 1 drop into both eyes at bedtime.   latanoprost 0.005 % ophthalmic solution Commonly known as: XALATAN INSTILL 1 DROP INTO BOTH EYES NIGHTLY   tamsulosin 0.4 MG Caps capsule Commonly known as: FLOMAX Take 0.4 mg by mouth daily after supper.       Allergies:  Allergies  Allergen Reactions  . Codeine Swelling  . Penicillins Swelling    No family history on file.  Social History:  reports that he has never smoked.  He has never used smokeless tobacco. He reports that he does not drink alcohol and does not use drugs.  ROS: A complete review of systems was performed.  All systems are negative except for pertinent findings as noted.  Physical Exam:  Vital signs in last 24 hours: There were no vitals taken for this visit. Constitutional:  Alert and oriented, No acute distress Cardiovascular: Regular rate  Neurologic: Grossly intact, no focal deficits Psychiatric: Normal mood and affect  I have reviewed prior pt notes  I have reviewed notes from referring/previous physicians  I have reviewed urinalysis results  I have independently reviewed prior imaging  I have reviewed prior PSA/T results   Impression/Assessment:  PCa - Pt is tolerating his ADT well. His PSA has elevated slightly to 7.7 and his Testosterone is detectable at 13, but appropriately low.  Plan:  1. Pt received his Lupron today.  2. Pt continued on tamsulosin & fosamax.  3. F/U in 4  months for OV, Lupron, PSA, Testosterone, and symptom recheck.  CC: Dr. Redmond School

## 2020-05-20 NOTE — Progress Notes (Signed)
Urological Symptom Review  Patient is experiencing the following symptoms: Frequent urination Hard to postpone urination Get up at night to urinate Leakage of urine Stream starts and stops   Review of Systems  Gastrointestinal (upper)  : Negative for upper GI symptoms  Gastrointestinal (lower) : Negative for lower GI symptoms  Constitutional : Fatigue  Skin: Negative for skin symptoms  Eyes: Blurred vision  Ear/Nose/Throat : Negative for Ear/Nose/Throat symptoms  Hematologic/Lymphatic: Negative  Cardiovascular : Negative for cardiovascular symptoms  Respiratory : Shortness of breath  Endocrine: Negative for endocrine symptoms  Musculoskeletal: Negative for musculoskeletal symptoms  Neurological: Negative for neurological symptoms  Psychologic: Negative for psychiatric symptoms

## 2020-05-22 ENCOUNTER — Telehealth: Payer: Self-pay

## 2020-05-22 NOTE — Telephone Encounter (Signed)
-----   Message from Franchot Gallo, MD sent at 05/22/2020  3:40 PM EST ----- Regarding: RE: I said to make sure he continues those. I think I remember telling him not to take bicalutamide. ----- Message ----- From: Iris Pert, LPN Sent: 11/20/4194   3:28 PM EST To: Franchot Gallo, MD  Daughter called to confirm a medication that you said her father did not have to take. I see were he was to continue tamsulosin & fosamax. Please advise.

## 2020-05-22 NOTE — Telephone Encounter (Signed)
Daughter called and made aware. 

## 2020-05-30 DIAGNOSIS — Z23 Encounter for immunization: Secondary | ICD-10-CM | POA: Diagnosis not present

## 2020-06-16 DIAGNOSIS — E7849 Other hyperlipidemia: Secondary | ICD-10-CM | POA: Diagnosis not present

## 2020-06-16 DIAGNOSIS — Z720 Tobacco use: Secondary | ICD-10-CM | POA: Diagnosis not present

## 2020-06-16 DIAGNOSIS — I2510 Atherosclerotic heart disease of native coronary artery without angina pectoris: Secondary | ICD-10-CM | POA: Diagnosis not present

## 2020-06-16 DIAGNOSIS — E1165 Type 2 diabetes mellitus with hyperglycemia: Secondary | ICD-10-CM | POA: Diagnosis not present

## 2020-08-12 ENCOUNTER — Telehealth: Payer: Self-pay

## 2020-08-12 ENCOUNTER — Other Ambulatory Visit: Payer: Self-pay

## 2020-08-12 DIAGNOSIS — M818 Other osteoporosis without current pathological fracture: Secondary | ICD-10-CM

## 2020-08-12 MED ORDER — ALENDRONATE SODIUM 70 MG PO TABS: 70.0000 mg | ORAL_TABLET | ORAL | 11 refills | Status: DC

## 2020-08-12 NOTE — Telephone Encounter (Signed)
Wanting to know why this medication was denied for refill: alendronate (FOSAMAX) 70 MG tablet  [employer] - daughter [phone]  Thanks, Helene Kelp

## 2020-08-12 NOTE — Telephone Encounter (Signed)
Medication refilled and daughter called and made aware.

## 2020-09-23 ENCOUNTER — Ambulatory Visit (INDEPENDENT_AMBULATORY_CARE_PROVIDER_SITE_OTHER): Payer: Medicare Other | Admitting: Urology

## 2020-09-23 ENCOUNTER — Other Ambulatory Visit: Payer: Self-pay

## 2020-09-23 ENCOUNTER — Encounter: Payer: Self-pay | Admitting: Urology

## 2020-09-23 VITALS — BP 167/61 | HR 52 | Temp 98.3°F | Ht 67.0 in

## 2020-09-23 DIAGNOSIS — C61 Malignant neoplasm of prostate: Secondary | ICD-10-CM | POA: Diagnosis not present

## 2020-09-23 MED ORDER — LEUPROLIDE ACETATE (4 MONTH) 30 MG ~~LOC~~ KIT
30.0000 mg | PACK | Freq: Once | SUBCUTANEOUS | Status: AC
  Administered 2020-09-23: 30 mg via SUBCUTANEOUS

## 2020-09-23 NOTE — Progress Notes (Signed)
Urological Symptom Review  Patient is experiencing the following symptoms: Frequent urination Hard to postpone urination Get up at night to urinate Leakage of urine Stream starts and stops   Review of Systems  Gastrointestinal (upper)  : Negative for upper GI symptoms  Gastrointestinal (lower) : Constipation  Constitutional : Weight loss Fatigue  Skin: Negative for skin symptoms  Eyes: Negative for eye symptoms  Ear/Nose/Throat : Negative for Ear/Nose/Throat symptoms  Hematologic/Lymphatic: Negative for Hematologic/Lymphatic symptoms  Cardiovascular : Leg swelling  Respiratory : Shortness of breath  Endocrine: Negative for endocrine symptoms  Musculoskeletal: Joint pain  Neurological: Negative for neurological symptoms  Psychologic: Negative for psychiatric symptoms

## 2020-09-23 NOTE — Progress Notes (Signed)
History of Present Illness: Here for prostate cancer follow-up  Hormonal Therapy for treatment.  He had EBRT for PCA in 2003--unknown stage/grade. When first seen in 2015, he had been on androgen deprivation therapy with Lupron and his PSA was 2.65. Before his first meeting, he was cared for by Dr. Michela Pitcher.  After his first visit, in Feb 2016 he had a bone scan which revealed no evidence of osseous metastatic disease. Bone density study did reveal osteoporosis. He was placed on alendronate 70 mg weekly.  His PSA in Feb 2016 was 2.70 (approximately what it was previously iin October, 2015). Testosterone levels were at castrate level. He had a repeat PSA in May, 2016 that was 2.9.  Because of increasing PSA (16 on 3.1.2017), he was restarted on androgen deprivation therapy in March, 2017. At that time, bone scan was negative for osseous metastatic disease.  In July, 2017 was down to 4.6.  CT scan in Nov 2017 revealed stable pulmonary nodules, no evidence of intra-abdominal or skeletal metastatic disease.   12.4.2018: He is having some shoulder but no other bony pain. Most recent PSA is up to 13.7. It is been over a year since he has received a Lupron injection. He was restarted on Lupron at this appt.   4.9.2019:PSA 6.4. Lupron 30 mg administered   4.17.2019:bone scan negative for osseous metastatic disease.   4.26.2019:CT A/P negative for metastatic disease.   8.13.2019:No blood in urine/stool. No bony pain. Wt stable. PSA 5.5. He was given a 4 month Lupron injection.   12.17.2019: Lupron not given. PSA 6.2   4.28.2020:Most recent PSA 6.2 (2.8.2020) .   8.25.2020: PSA 20.4. He has been having some dysuria intermittently but not currently -- though he has been having increased frequency. He denies blood per urine. He denies any new bony pain. He does report having diminished gustatory sense. He uses diapers to manage incontinence/leakage.   10.27.2020: Recent CT A/P, bone scan--NED.  He reports stable urinary sx's that he is tolerating well. He feels like he is emptying fully. He denies any blood per urine or dysuria.  5.25.2021:He returns today for f/u and to reinitiate ADT. This decision was based on recently elevated PSA, which was measured at 42.1 on 4.6.2021.ADT resumed with 4 mo eligard given today.  9.28.2021:PSA - 6.4 Pt here for F/U of PCa. Pt is experiencing increased urinary frequency and nocturia, but notes that other urinary symptomatology has remained stable. Pt does not perceive increased urinary frequency associated with furosemide. Pt notes no new aches or pains.   2.1.2022: PSA - 7.7, T level 13 [name] is here for F/U of his PCa. He is experiencing urinary frequency and urgency but he denies any gross hematuria or recent dysuria. He wears diapers for incontinence and will use approximately 1 per day. He continues on tamsulosin and denies any associated dizziness upon standing.  6.7.2022: Doing okay.  He does have difficulty getting to the bathroom in time.  Still only wearing 1 diaper a day.  No gross hematuria or dysuria.  He is on tamsulosin, alendronate, furosemide.  Past Medical History:  Diagnosis Date  . Cancer ([place])   . Hypertension   . Kidney calculus 2014;2013  . Pneumonia   . Shortness of breath     Past Surgical History:  Procedure Laterality Date  . CATARACT EXTRACTION W/PHACO Right 02/13/2013   Procedure: CATARACT EXTRACTION RIGHT EYE (WITH PHACO) AND INTRAOCULAR LENS PLACEMENT  CDE=14.53;  Surgeon: Elta Guadeloupe T. Gershon Crane, MD;  Location: [facility];  Service: Ophthalmology;  Laterality: Right;  . CATARACT EXTRACTION W/PHACO Left 02/27/2013   Procedure: CATARACT EXTRACTION PHACO AND INTRAOCULAR LENS PLACEMENT (IOC);  Surgeon: Elta Guadeloupe T. Gershon Crane, MD;  Location: [facility];  Service: Ophthalmology;  Laterality: Left;  CDE:12.54  . COLONOSCOPY    . JOINT REPLACEMENT Right 1993;1980   Right x2    Home Medications:  Allergies as of 09/23/2020       Reactions   Codeine Swelling   Penicillins Swelling      Medication List       Accurate as of September 23, 2020  3:42 PM. If you have any questions, ask your nurse or doctor.        alendronate 70 MG tablet Commonly known as: Fosamax Take 1 tablet (70 mg total) by mouth once a week. Take with a full glass of water on an empty stomach.   aspirin EC 81 MG tablet Take 81 mg by mouth daily.   benazepril 10 MG tablet Commonly known as: LOTENSIN Take 10 mg by mouth daily.   bicalutamide 50 MG tablet Commonly known as: Casodex Take 1 tablet (50 mg total) by mouth daily.   dorzolamide-timolol 22.3-6.8 MG/ML ophthalmic solution Commonly known as: COSOPT Place 1 drop into both eyes 2 (two) times daily.   dorzolamide-timolol 22.3-6.8 MG/ML ophthalmic solution Commonly known as: COSOPT INSTILL 1 DROP INTO BOTH EYES TWICE DAILY   fish oil-omega-3 fatty acids 1000 MG capsule Take 1 g by mouth daily.   furosemide 20 MG tablet Commonly known as: LASIX Take 20-40 mg by mouth daily as needed for fluid.   hydrochlorothiazide 25 MG tablet Commonly known as: HYDRODIURIL Take 25 mg by mouth daily.   latanoprost 0.005 % ophthalmic solution Commonly known as: XALATAN Place 1 drop into both eyes at bedtime.   latanoprost 0.005 % ophthalmic solution Commonly known as: XALATAN INSTILL 1 DROP INTO BOTH EYES NIGHTLY   tamsulosin 0.4 MG Caps capsule Commonly known as: FLOMAX Take 0.4 mg by mouth daily after supper.       Allergies:  Allergies  Allergen Reactions  . Codeine Swelling  . Penicillins Swelling    No family history on file.  Social History:  reports that he has never smoked. He has never used smokeless tobacco. He reports that he does not drink alcohol and does not use drugs.  ROS: A complete review of systems was performed.  All systems are negative except for pertinent findings as noted.  Physical Exam:  Vital signs in last 24 hours: BP (!) 167/61   Pulse (!)  52   Temp 98.3 F (36.8 C) (Oral)   Ht 5\' 7"  (1.702 m)   BMI 33.67 kg/m  Constitutional:  Alert and oriented, No acute distress Cardiovascular: Regular rate  Respiratory: Normal respiratory effort but somewhat short of breath at rest Genitourinary: Normal male phallus, testes are descended bilaterally and non-tender and without masses, scrotum is normal in appearance without lesions or masses, perineum is normal on inspection.  Rectal revealed normal sphincter tone.  Gland firm, abyss asymmetrical with right lobe larger than left. Lymphatic: No lymphadenopathy Neurologic: Grossly intact, no focal deficits Psychiatric: Normal mood and affect  I have reviewed prior pt notes   I have reviewed prior PSA/testosterone results    Impression/Assessment:  Recurrent prostate cancer following definitive therapy, on androgen deprivation therapy.  Slight increase in PSA between September 21 and January 22.  Plan:  1.  He will be administered leuprolide 30 mg IM today  2.  Laboratories drawn  3.  I will have him come back in 4 months for repeat leuprolide and labs.

## 2020-09-24 LAB — PSA: Prostate Specific Ag, Serum: 8.9 ng/mL — ABNORMAL HIGH (ref 0.0–4.0)

## 2020-10-14 NOTE — Progress Notes (Signed)
Sent via mail 

## 2020-11-24 DIAGNOSIS — I739 Peripheral vascular disease, unspecified: Secondary | ICD-10-CM | POA: Diagnosis not present

## 2020-11-24 DIAGNOSIS — M79674 Pain in right toe(s): Secondary | ICD-10-CM | POA: Diagnosis not present

## 2020-11-24 DIAGNOSIS — M79671 Pain in right foot: Secondary | ICD-10-CM | POA: Diagnosis not present

## 2020-11-24 DIAGNOSIS — M79672 Pain in left foot: Secondary | ICD-10-CM | POA: Diagnosis not present

## 2020-11-24 DIAGNOSIS — M79675 Pain in left toe(s): Secondary | ICD-10-CM | POA: Diagnosis not present

## 2021-01-26 NOTE — Progress Notes (Addendum)
History of Present Illness:   He had EBRT for PCA in 2003--unknown stage/grade. When first seen in 2015, he had been on androgen deprivation therapy with Lupron and his PSA was 2.65. Before his first meeting, he was cared for by Dr. Michela Pitcher.  After his first visit, in Feb 2016 he had a bone scan which revealed no evidence of osseous metastatic disease. Bone density study did reveal osteoporosis. He was placed on alendronate 70 mg weekly.  His PSA in Feb 2016 was 2.70 (approximately what it was previously iin October, 2015). Testosterone levels were at castrate level. He had a repeat PSA in May, 2016 that was 2.9.  Because of increasing PSA (16 on 3.1.2017), he was restarted on androgen deprivation therapy in March, 2017. At that time, bone scan was negative for osseous metastatic disease.  In July, 2017 was down to 4.6.  CT scan in Nov 2017 revealed stable pulmonary nodules, no evidence of intra-abdominal or skeletal metastatic disease.   12.4.2018: He is having some shoulder but no other bony pain. Most recent PSA is up to 13.7. It is been over a year since he has received a Lupron injection. He was restarted on Lupron at this appt.    4.9.2019: PSA 6.4. Lupron 30 mg administered    4.17.2019: bone scan negative for osseous metastatic disease.    4.26.2019: CT A/P negative for metastatic disease.    8.13.2019: No blood in urine/stool. No bony pain. Wt stable. PSA 5.5. He was given a 4 month Lupron injection.    12.17.2019: Lupron not given. PSA 6.2    4.28.2020: Most recent PSA 6.2  8.25.2020: PSA 20.4.   10.27.2020: Recent CT A/P, bone scan--NED.  5.25.2021: He returns today for f/u and to reinitiate ADT. This decision was based on recently elevated PSA, which was measured at 42.1 on 4.6.2021. ADT resumed with 4 mo eligard given today.  9.28.2021: PSA - 6.4 2.1.2022: PSA - 7.7,  6.7.2022: PSA 8.9   10.11.2022: He is having no change in constitutional symptoms.  No blood in his urine or  stool.  He does have significant urgency, frequency and urgency incontinence.  He has significant mobility issues.  No bony pain.  Stable weight.  Past Medical History:  Diagnosis Date   Cancer [facility])    Hypertension    Kidney calculus 2014;2013   Pneumonia    Shortness of breath     Past Surgical History:  Procedure Laterality Date   CATARACT EXTRACTION W/PHACO Right 02/13/2013   Procedure: CATARACT EXTRACTION RIGHT EYE (WITH PHACO) AND INTRAOCULAR LENS PLACEMENT  CDE=14.53;  Surgeon: Elta Guadeloupe T. Gershon Crane, MD;  Location: [facility];  Service: Ophthalmology;  Laterality: Right;   CATARACT EXTRACTION W/PHACO Left 02/27/2013   Procedure: CATARACT EXTRACTION PHACO AND INTRAOCULAR LENS PLACEMENT (IOC);  Surgeon: Elta Guadeloupe T. Gershon Crane, MD;  Location: [facility];  Service: Ophthalmology;  Laterality: Left;  CDE:12.54   COLONOSCOPY     JOINT REPLACEMENT Right 1993;1980   Right x2    Home Medications:  Allergies as of 01/27/2021       Reactions   Codeine Swelling   Penicillins Swelling        Medication List        Accurate as of January 26, 2021  8:21 PM. If you have any questions, ask your nurse or doctor.          alendronate 70 MG tablet Commonly known as: Fosamax Take 1 tablet (70 mg total) by mouth once a week. Take  with a full glass of water on an empty stomach.   aspirin EC 81 MG tablet Take 81 mg by mouth daily.   benazepril 10 MG tablet Commonly known as: LOTENSIN Take 10 mg by mouth daily.   bicalutamide 50 MG tablet Commonly known as: Casodex Take 1 tablet (50 mg total) by mouth daily.   dorzolamide-timolol 22.3-6.8 MG/ML ophthalmic solution Commonly known as: COSOPT Place 1 drop into both eyes 2 (two) times daily.   dorzolamide-timolol 22.3-6.8 MG/ML ophthalmic solution Commonly known as: COSOPT INSTILL 1 DROP INTO BOTH EYES TWICE DAILY   fish oil-omega-3 fatty acids 1000 MG capsule Take 1 g by mouth daily.   furosemide 20 MG tablet Commonly known as: LASIX Take  20-40 mg by mouth daily as needed for fluid.   hydrochlorothiazide 25 MG tablet Commonly known as: HYDRODIURIL Take 25 mg by mouth daily.   latanoprost 0.005 % ophthalmic solution Commonly known as: XALATAN Place 1 drop into both eyes at bedtime.   latanoprost 0.005 % ophthalmic solution Commonly known as: XALATAN INSTILL 1 DROP INTO BOTH EYES NIGHTLY   tamsulosin 0.4 MG Caps capsule Commonly known as: FLOMAX Take 0.4 mg by mouth daily after supper.        Allergies:  Allergies  Allergen Reactions   Codeine Swelling   Penicillins Swelling    No family history on file.  Social History:  reports that he has never smoked. He has never used smokeless tobacco. He reports that he does not drink alcohol and does not use drugs.  ROS: A complete review of systems was performed.  All systems are negative except for pertinent findings as noted.  Physical Exam:  Vital signs in last 24 hours: There were no vitals taken for this visit. Constitutional:  Alert and oriented, No acute distress Cardiovascular: Regular rate  Respiratory: Somewhat labored effort Neurologic: Grossly intact, no focal deficits Psychiatric: Normal mood and affect  I have reviewed prior pt notes  I have reviewed urinalysis results  I have reviewed prior PSA results  Residual urine volume today measured by ultrasound was negligible    Impression/Assessment:  Prostate cancer, recurrent, on androgen deprivation therapy.  He seems to be doing well without any recent change  Overactive bladder, symptoms exacerbated by lack of mobility  Plan:  8-month leuprolide injection given today  I will have his PSA and testosterone level checked  Unless significant change, I will see back in 4 months with repeat labs/leuprolide

## 2021-01-27 ENCOUNTER — Other Ambulatory Visit: Payer: Self-pay

## 2021-01-27 ENCOUNTER — Ambulatory Visit (INDEPENDENT_AMBULATORY_CARE_PROVIDER_SITE_OTHER): Payer: Medicare Other | Admitting: Urology

## 2021-01-27 ENCOUNTER — Encounter: Payer: Self-pay | Admitting: Urology

## 2021-01-27 VITALS — BP 152/77 | HR 99 | Temp 97.9°F

## 2021-01-27 DIAGNOSIS — R9721 Rising PSA following treatment for malignant neoplasm of prostate: Secondary | ICD-10-CM

## 2021-01-27 DIAGNOSIS — M818 Other osteoporosis without current pathological fracture: Secondary | ICD-10-CM

## 2021-01-27 DIAGNOSIS — C61 Malignant neoplasm of prostate: Secondary | ICD-10-CM

## 2021-01-27 LAB — URINALYSIS, ROUTINE W REFLEX MICROSCOPIC
Bilirubin, UA: NEGATIVE
Glucose, UA: NEGATIVE
Ketones, UA: NEGATIVE
Nitrite, UA: POSITIVE — AB
Protein,UA: NEGATIVE
RBC, UA: NEGATIVE
Specific Gravity, UA: 1.015 (ref 1.005–1.030)
Urobilinogen, Ur: 1 mg/dL (ref 0.2–1.0)
pH, UA: 6 (ref 5.0–7.5)

## 2021-01-27 LAB — MICROSCOPIC EXAMINATION
Epithelial Cells (non renal): >10 /hpf — AB (ref 0–10)
Renal Epithel, UA: NONE SEEN /hpf

## 2021-01-27 MED ORDER — LEUPROLIDE ACETATE (4 MONTH) 30 MG ~~LOC~~ KIT
30.0000 mg | PACK | Freq: Once | SUBCUTANEOUS | Status: AC
  Administered 2021-01-27: 30 mg via SUBCUTANEOUS

## 2021-01-27 NOTE — Progress Notes (Signed)
Urological Symptom Review  Patient is experiencing the following symptoms: Frequent urination Hard to postpone urination Get up at night to urinate Leakage of urine Urinary tract infection   Review of Systems  Gastrointestinal (upper)  : Negative for upper GI symptoms  Gastrointestinal (lower) : Constipation  Constitutional : Weight loss Fatigue  Skin: Negative for skin symptoms  Eyes: Negative for eye symptoms  Ear/Nose/Throat : Negative for Ear/Nose/Throat symptoms  Hematologic/Lymphatic: Negative for Hematologic/Lymphatic symptoms  Cardiovascular : Leg swelling  Respiratory : Shortness of breath  Endocrine: Negative for endocrine symptoms  Musculoskeletal: Negative for musculoskeletal symptoms  Neurological: Negative for neurological symptoms  Psychologic: Negative for psychiatric symptoms

## 2021-01-28 LAB — TESTOSTERONE: Testosterone: <3 ng/dL — ABNORMAL LOW (ref 264–916)

## 2021-01-28 LAB — PSA: Prostate Specific Ag, Serum: 9.1 ng/mL — ABNORMAL HIGH (ref 0.0–4.0)

## 2021-01-30 ENCOUNTER — Telehealth: Payer: Self-pay

## 2021-01-30 NOTE — Telephone Encounter (Signed)
Called spoke w/ pt's daughter Rod Holler  , informed him of his lab results. Advised that PSA is stable , pt's daughter understood w/o  any concerns.

## 2021-01-30 NOTE — Telephone Encounter (Signed)
-----   Message from Franchot Gallo, MD sent at 01/29/2021  9:17 AM EDT ----- Notify pt--psa stable @ 9 ----- Message ----- From: Mardelle Matte, CMA Sent: 01/28/2021   8:30 AM EDT To: Franchot Gallo, MD  Please review

## 2021-02-02 DIAGNOSIS — M79674 Pain in right toe(s): Secondary | ICD-10-CM | POA: Diagnosis not present

## 2021-02-02 DIAGNOSIS — M79671 Pain in right foot: Secondary | ICD-10-CM | POA: Diagnosis not present

## 2021-02-02 DIAGNOSIS — I739 Peripheral vascular disease, unspecified: Secondary | ICD-10-CM | POA: Diagnosis not present

## 2021-02-02 DIAGNOSIS — M79672 Pain in left foot: Secondary | ICD-10-CM | POA: Diagnosis not present

## 2021-02-02 DIAGNOSIS — M79675 Pain in left toe(s): Secondary | ICD-10-CM | POA: Diagnosis not present

## 2021-02-11 DIAGNOSIS — H6122 Impacted cerumen, left ear: Secondary | ICD-10-CM | POA: Diagnosis not present

## 2021-02-11 DIAGNOSIS — Z6831 Body mass index (BMI) 31.0-31.9, adult: Secondary | ICD-10-CM | POA: Diagnosis not present

## 2021-02-11 DIAGNOSIS — Z1331 Encounter for screening for depression: Secondary | ICD-10-CM | POA: Diagnosis not present

## 2021-02-11 DIAGNOSIS — I10 Essential (primary) hypertension: Secondary | ICD-10-CM | POA: Diagnosis not present

## 2021-02-11 DIAGNOSIS — Z23 Encounter for immunization: Secondary | ICD-10-CM | POA: Diagnosis not present

## 2021-02-11 DIAGNOSIS — Z0000 Encounter for general adult medical examination without abnormal findings: Secondary | ICD-10-CM | POA: Diagnosis not present

## 2021-02-11 DIAGNOSIS — E6609 Other obesity due to excess calories: Secondary | ICD-10-CM | POA: Diagnosis not present

## 2021-02-11 DIAGNOSIS — M7061 Trochanteric bursitis, right hip: Secondary | ICD-10-CM | POA: Diagnosis not present

## 2021-02-11 DIAGNOSIS — H6121 Impacted cerumen, right ear: Secondary | ICD-10-CM | POA: Diagnosis not present

## 2021-05-11 NOTE — Progress Notes (Signed)
History of Present Illness:   He had EBRT for PCA in 2003--unknown stage/grade. When first seen in 2015, he had been on androgen deprivation therapy with Lupron and his PSA was 2.65. Before his first meeting, he was cared for by Dr. Michela Pitcher.  After his first visit, in Feb 2016 he had a bone scan which revealed no evidence of osseous metastatic disease. Bone density study did reveal osteoporosis. He was placed on alendronate 70 mg weekly.  His PSA in Feb 2016 was 2.70 (approximately what it was previously iin October, 2015). Testosterone levels were at castrate level. He had a repeat PSA in May, 2016 that was 2.9.  Because of increasing PSA (16 on 3.1.2017), he was restarted on androgen deprivation therapy in March, 2017. At that time, bone scan was negative for osseous metastatic disease.  In July, 2017 was down to 4.6.  CT scan in Nov 2017 revealed stable pulmonary nodules, no evidence of intra-abdominal or skeletal metastatic disease.    12.4.2018: He is having some shoulder but no other bony pain. Most recent PSA is up to 13.7. It is been over a year since he has received a Lupron injection. He was restarted on Lupron at this appt.    4.9.2019: PSA 6.4. Lupron 30 mg administered    4.17.2019: bone scan negative for osseous metastatic disease.    4.26.2019: CT A/P negative for metastatic disease.    8.13.2019: No blood in urine/stool. No bony pain. Wt stable. PSA 5.5. He was given a 4 month Lupron injection.    12.17.2019: Lupron not given. PSA 6.2    4.28.2020: Most recent PSA 6.2  8.25.2020: PSA 20.4.   10.27.2020: Recent CT A/P, bone scan--NED.  5.25.2021: He returns today for f/u and to reinitiate ADT. This decision was based on recently elevated PSA, which was measured at 42.1 on 4.6.2021. ADT resumed with 4 mo eligard given today.  9.28.2021: PSA - 6.4 2.1.2022: PSA - 7.7 6.7.2022: PSA 8.9   10.11.2022: He is having no change in constitutional symptoms.  No blood in his urine or  stool.  He does have significant urgency, frequency and urgency incontinence.  He has significant mobility issues.  No bony pain.  Stable weight.  PSA 9.1, testosterone level castrate.  1.24.2023: He is here today for routine check, 3 weeks early for his leuprolide.  Does complain of losing some weight.  Still having mobility issues.  He uses a walker and barely gets around at home.  He is having no gross hematuria.  No bony pain.  States that he just does not feel like eating much.  Past Medical History:  Diagnosis Date   Cancer [facility])    Hypertension    Kidney calculus 2014;2013   Pneumonia    Shortness of breath     Past Surgical History:  Procedure Laterality Date   CATARACT EXTRACTION W/PHACO Right 02/13/2013   Procedure: CATARACT EXTRACTION RIGHT EYE (WITH PHACO) AND INTRAOCULAR LENS PLACEMENT  CDE=14.53;  Surgeon: Elta Guadeloupe T. Gershon Crane, MD;  Location: [facility];  Service: Ophthalmology;  Laterality: Right;   CATARACT EXTRACTION W/PHACO Left 02/27/2013   Procedure: CATARACT EXTRACTION PHACO AND INTRAOCULAR LENS PLACEMENT (IOC);  Surgeon: Elta Guadeloupe T. Gershon Crane, MD;  Location: [facility];  Service: Ophthalmology;  Laterality: Left;  CDE:12.54   COLONOSCOPY     JOINT REPLACEMENT Right 1993;1980   Right x2    Home Medications:  Allergies as of 05/12/2021       Reactions   Codeine Swelling  Penicillins Swelling        Medication List        Accurate as of May 11, 2021  8:48 PM. If you have any questions, ask your nurse or doctor.          alendronate 70 MG tablet Commonly known as: Fosamax Take 1 tablet (70 mg total) by mouth once a week. Take with a full glass of water on an empty stomach.   aspirin EC 81 MG tablet Take 81 mg by mouth daily.   benazepril 10 MG tablet Commonly known as: LOTENSIN Take 10 mg by mouth daily.   bicalutamide 50 MG tablet Commonly known as: Casodex Take 1 tablet (50 mg total) by mouth daily.   dorzolamide-timolol 22.3-6.8 MG/ML ophthalmic  solution Commonly known as: COSOPT Place 1 drop into both eyes 2 (two) times daily.   dorzolamide-timolol 22.3-6.8 MG/ML ophthalmic solution Commonly known as: COSOPT INSTILL 1 DROP INTO BOTH EYES TWICE DAILY   fish oil-omega-3 fatty acids 1000 MG capsule Take 1 g by mouth daily.   furosemide 20 MG tablet Commonly known as: LASIX Take 20-40 mg by mouth daily as needed for fluid.   hydrochlorothiazide 25 MG tablet Commonly known as: HYDRODIURIL Take 25 mg by mouth daily.   latanoprost 0.005 % ophthalmic solution Commonly known as: XALATAN Place 1 drop into both eyes at bedtime.   latanoprost 0.005 % ophthalmic solution Commonly known as: XALATAN INSTILL 1 DROP INTO BOTH EYES NIGHTLY   tamsulosin 0.4 MG Caps capsule Commonly known as: FLOMAX Take 0.4 mg by mouth daily after supper.        Allergies:  Allergies  Allergen Reactions   Codeine Swelling   Penicillins Swelling    No family history on file.  Social History:  reports that he has never smoked. He has never used smokeless tobacco. He reports that he does not drink alcohol and does not use drugs.  ROS: A complete review of systems was performed.  All systems are negative except for pertinent findings as noted.  Physical Exam:  Vital signs in last 24 hours: There were no vitals taken for this visit. 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  Performance level fairly poor but that is basically due to his age.  He has not had any  significant imaging in a couple of years  Plan:  1.  I will bring him back for nurse visit in about 3 weeks for his 58-month leuprolide  2.  Labs were drawn today  3.  I will attempt to have him go for bone scan  4.  Office visit in about 5 months for his next leuprolide

## 2021-05-12 ENCOUNTER — Encounter: Payer: Self-pay | Admitting: Urology

## 2021-05-12 ENCOUNTER — Ambulatory Visit (INDEPENDENT_AMBULATORY_CARE_PROVIDER_SITE_OTHER): Payer: Medicare Other | Admitting: Urology

## 2021-05-12 ENCOUNTER — Other Ambulatory Visit: Payer: Self-pay

## 2021-05-12 VITALS — BP 129/69 | HR 87 | Temp 98.0°F

## 2021-05-12 DIAGNOSIS — R9721 Rising PSA following treatment for malignant neoplasm of prostate: Secondary | ICD-10-CM

## 2021-05-12 DIAGNOSIS — M818 Other osteoporosis without current pathological fracture: Secondary | ICD-10-CM

## 2021-05-12 DIAGNOSIS — C61 Malignant neoplasm of prostate: Secondary | ICD-10-CM

## 2021-05-12 LAB — URINALYSIS, ROUTINE W REFLEX MICROSCOPIC
Bilirubin, UA: NEGATIVE
Glucose, UA: NEGATIVE
Ketones, UA: NEGATIVE
Nitrite, UA: POSITIVE — AB
Protein,UA: NEGATIVE
Specific Gravity, UA: 1.025 (ref 1.005–1.030)
Urobilinogen, Ur: 0.2 mg/dL (ref 0.2–1.0)
pH, UA: 5.5 (ref 5.0–7.5)

## 2021-05-12 LAB — MICROSCOPIC EXAMINATION

## 2021-05-12 NOTE — Addendum Note (Signed)
Addended by: Tyrone Apple on: 05/12/2021 04:29 PM   Modules accepted: Orders

## 2021-05-12 NOTE — Progress Notes (Signed)
Urological Symptom Review  Patient is experiencing the following symptoms: Frequent urination Hard to postpone urination Burning/pain with urination Get up at night to urinate Leakage of urine Stream starts and stops   Review of Systems  Gastrointestinal (upper)  : Negative for upper GI symptoms  Gastrointestinal (lower) : Constipation  Constitutional : Weight loss Fatigue  Skin: Negative for skin symptoms  Eyes: Negative for eye symptoms  Ear/Nose/Throat : Negative for Ear/Nose/Throat symptoms  Hematologic/Lymphatic: Negative for Hematologic/Lymphatic symptoms  Cardiovascular : Negative for cardiovascular symptoms  Respiratory : Shortness of breath  Endocrine: Negative for endocrine symptoms  Musculoskeletal: Negative for musculoskeletal symptoms  Neurological: Negative for neurological symptoms  Psychologic: Negative for psychiatric symptoms

## 2021-05-13 LAB — TESTOSTERONE: Testosterone: <3 ng/dL — ABNORMAL LOW (ref 264–916)

## 2021-05-13 LAB — PSA: Prostate Specific Ag, Serum: 14 ng/mL — ABNORMAL HIGH (ref 0.0–4.0)

## 2021-05-22 ENCOUNTER — Telehealth: Payer: Self-pay

## 2021-05-22 NOTE — Telephone Encounter (Signed)
Patient daughter called and notified.  

## 2021-05-22 NOTE — Telephone Encounter (Signed)
-----   Message from Franchot Gallo, MD sent at 05/22/2021 12:02 PM EST ----- Notify patient that his PSA is up a bit to 14 but we will just continue to follow at the present time ----- Message ----- From: Dorisann Frames, RN Sent: 05/13/2021  12:09 PM EST To: Franchot Gallo, MD  Please review

## 2021-05-26 ENCOUNTER — Encounter (HOSPITAL_COMMUNITY)
Admission: RE | Admit: 2021-05-26 | Discharge: 2021-05-26 | Disposition: A | Discharge status: Home / Self Care | Payer: Medicare Other | Source: Ambulatory Visit | Attending: Urology | Admitting: Urology

## 2021-05-26 ENCOUNTER — Other Ambulatory Visit: Payer: Self-pay

## 2021-05-26 DIAGNOSIS — R9721 Rising PSA following treatment for malignant neoplasm of prostate: Secondary | ICD-10-CM | POA: Diagnosis not present

## 2021-05-26 DIAGNOSIS — C61 Malignant neoplasm of prostate: Secondary | ICD-10-CM | POA: Diagnosis not present

## 2021-05-26 MED ORDER — TECHNETIUM TC 99M MEDRONATE IV KIT
20.0000 | PACK | Freq: Once | INTRAVENOUS | Status: AC | PRN
  Administered 2021-05-26: 20 via INTRAVENOUS

## 2021-06-02 ENCOUNTER — Other Ambulatory Visit: Payer: Self-pay

## 2021-06-02 ENCOUNTER — Ambulatory Visit (INDEPENDENT_AMBULATORY_CARE_PROVIDER_SITE_OTHER): Payer: Medicare Other

## 2021-06-02 DIAGNOSIS — C61 Malignant neoplasm of prostate: Secondary | ICD-10-CM | POA: Diagnosis not present

## 2021-06-02 MED ORDER — LEUPROLIDE ACETATE (4 MONTH) 30 MG ~~LOC~~ KIT
30.0000 mg | PACK | Freq: Once | SUBCUTANEOUS | Status: AC
  Administered 2021-06-02: 30 mg via SUBCUTANEOUS

## 2021-06-02 NOTE — Progress Notes (Signed)
Eligard SubQ Injection   Due to Prostate Cancer patient is present today for a Eligard Injection.  Medication: Eligard 4 month Dose: 30 mg  Location: right   Patient tolerated well, no complications were noted  Performed by: Kady Toothaker LPN

## 2021-06-11 ENCOUNTER — Telehealth: Payer: Self-pay

## 2021-06-11 NOTE — Telephone Encounter (Signed)
Daughter called and made aware. 

## 2021-06-11 NOTE — Telephone Encounter (Signed)
-----   Message from Franchot Gallo, MD sent at 06/11/2021 12:16 PM EST ----- Let pt know that bone scan looked good--no significant cancer in bones ----- Message ----- From: Iris Pert, LPN Sent: 12/24/9247   8:33 AM EST To: Franchot Gallo, MD  Please review

## 2021-09-01 ENCOUNTER — Other Ambulatory Visit: Payer: Self-pay

## 2021-09-01 DIAGNOSIS — M818 Other osteoporosis without current pathological fracture: Secondary | ICD-10-CM

## 2021-09-01 MED ORDER — ALENDRONATE SODIUM 70 MG PO TABS: 70.0000 mg | ORAL_TABLET | ORAL | 1 refills | Status: DC

## 2021-10-06 ENCOUNTER — Other Ambulatory Visit: Payer: Medicare Other

## 2021-10-06 DIAGNOSIS — C61 Malignant neoplasm of prostate: Secondary | ICD-10-CM | POA: Diagnosis not present

## 2021-10-07 LAB — PSA: Prostate Specific Ag, Serum: 9 ng/mL — ABNORMAL HIGH (ref 0.0–4.0)

## 2021-10-07 LAB — TESTOSTERONE: Testosterone: 5 ng/dL — ABNORMAL LOW (ref 264–916)

## 2021-10-10 ENCOUNTER — Other Ambulatory Visit: Payer: Self-pay | Admitting: Urology

## 2021-10-10 DIAGNOSIS — M818 Other osteoporosis without current pathological fracture: Secondary | ICD-10-CM

## 2021-10-13 ENCOUNTER — Encounter: Payer: Self-pay | Admitting: Urology

## 2021-10-13 ENCOUNTER — Ambulatory Visit (INDEPENDENT_AMBULATORY_CARE_PROVIDER_SITE_OTHER): Payer: Medicare Other | Admitting: Urology

## 2021-10-13 VITALS — BP 158/81 | HR 112

## 2021-10-13 DIAGNOSIS — R9721 Rising PSA following treatment for malignant neoplasm of prostate: Secondary | ICD-10-CM

## 2021-10-13 DIAGNOSIS — C61 Malignant neoplasm of prostate: Secondary | ICD-10-CM

## 2021-10-13 DIAGNOSIS — M818 Other osteoporosis without current pathological fracture: Secondary | ICD-10-CM | POA: Diagnosis not present

## 2021-10-13 DIAGNOSIS — N3000 Acute cystitis without hematuria: Secondary | ICD-10-CM | POA: Diagnosis not present

## 2021-10-13 MED ORDER — LEUPROLIDE ACETATE (4 MONTH) 30 MG ~~LOC~~ KIT
30.0000 mg | PACK | Freq: Once | SUBCUTANEOUS | Status: AC
  Administered 2021-10-13: 30 mg via SUBCUTANEOUS

## 2021-10-13 MED ORDER — CEPHALEXIN 500 MG PO CAPS: 500.0000 mg | ORAL_CAPSULE | Freq: Two times a day (BID) | ORAL | 0 refills | Status: AC

## 2021-10-13 NOTE — Progress Notes (Signed)
Eligard SubQ Injection   Due to Prostate Cancer patient is present today for a Eligard Injection.  Medication: Eligard 4 month Dose: 30 mg  Location: left  Patient tolerated well, no complications were noted  Performed by: Louise Rawson LPN

## 2021-10-14 LAB — MICROSCOPIC EXAMINATION: RBC, Urine: NONE SEEN /hpf (ref 0–2)

## 2021-10-14 LAB — URINALYSIS, ROUTINE W REFLEX MICROSCOPIC
Bilirubin, UA: NEGATIVE
Glucose, UA: NEGATIVE
Ketones, UA: NEGATIVE
Nitrite, UA: POSITIVE — AB
Protein,UA: NEGATIVE
RBC, UA: NEGATIVE
Specific Gravity, UA: 1.02 (ref 1.005–1.030)
Urobilinogen, Ur: 1 mg/dL (ref 0.2–1.0)
pH, UA: 6 (ref 5.0–7.5)

## 2021-10-15 LAB — URINE CULTURE

## 2021-12-10 DIAGNOSIS — I739 Peripheral vascular disease, unspecified: Secondary | ICD-10-CM | POA: Diagnosis not present

## 2021-12-10 DIAGNOSIS — M79675 Pain in left toe(s): Secondary | ICD-10-CM | POA: Diagnosis not present

## 2021-12-10 DIAGNOSIS — M79672 Pain in left foot: Secondary | ICD-10-CM | POA: Diagnosis not present

## 2021-12-10 DIAGNOSIS — M79674 Pain in right toe(s): Secondary | ICD-10-CM | POA: Diagnosis not present

## 2021-12-10 DIAGNOSIS — M79671 Pain in right foot: Secondary | ICD-10-CM | POA: Diagnosis not present

## 2021-12-21 DIAGNOSIS — I10 Essential (primary) hypertension: Secondary | ICD-10-CM | POA: Diagnosis not present

## 2022-01-05 ENCOUNTER — Other Ambulatory Visit: Payer: Self-pay | Admitting: Urology

## 2022-01-05 DIAGNOSIS — M818 Other osteoporosis without current pathological fracture: Secondary | ICD-10-CM

## 2022-02-03 ENCOUNTER — Other Ambulatory Visit: Payer: Self-pay | Admitting: Urology

## 2022-02-03 DIAGNOSIS — M818 Other osteoporosis without current pathological fracture: Secondary | ICD-10-CM

## 2022-02-16 ENCOUNTER — Ambulatory Visit: Payer: Medicare Other | Admitting: Urology

## 2022-02-16 NOTE — Progress Notes (Incomplete)
History of Present Illness: [name] is here today for follow-up of adenocarcinoma of the prostate.  He had EBRT for PCA in 2003--unknown stage/grade. When first seen in 2015, he had been on androgen deprivation therapy with Lupron and his PSA was 2.65. Before his first meeting, he was cared for by Dr. Michela Pitcher.  After his first visit, in Feb 2016 he had a bone scan which revealed no evidence of osseous metastatic disease. Bone density study did reveal osteoporosis. He was placed on alendronate 70 mg weekly.  His PSA in Feb 2016 was 2.70 (approximately what it was previously iin October, 2015). Testosterone levels were at castrate level. He had a repeat PSA in May, 2016 that was 2.9.  Because of increasing PSA (16 on 3.1.2017), he was restarted on ADT in March, 2017. At that time, bone scan was negative for osseous metastatic disease.  In July, 2017 was down to 4.6.  Nov 2017--CT scan revealed stable pulmonary nodules, no evidence of intra-abdominal or skeletal metastatic disease.    12.4.2018:  PSA is up to 13.7. It is been over a year since he has received a Lupron injection. He was restarted on Lupron at this appt.    4.9.2019: PSA 6.4. Lupron 30 mg administered    4.17.2019: bone scan negative for osseous metastatic disease.    4.26.2019: CT A/P negative for metastatic disease.    8.13.2019: No blood in urine/stool. No bony pain. Wt stable. PSA 5.5. He was given a 4 month Lupron injection.    12.17.2019: Lupron not given. PSA 6.2    4.28.2020: Most recent PSA 6.2  8.25.2020: PSA 20.4.   10.27.2020: Recent CT A/P, bone scan--NED.  5.25.2021: He returns today for f/u and to reinitiate ADT. This decision was based on recently elevated PSA, which was measured at 42.1 on 4.6.2021. ADT resumed with 4 mo eligard given today.  9.28.2021: PSA - 6.4 2.1.2022: PSA - 7.7 6.7.2022: PSA 8.9 10.11.2022: H PSA 9.1, testosterone level castrate. 1.24.2023: PSA 14, T level castrate  2.14.2023:  Leuprolide 30 mg administered 6.27.2023: PSA 9, T level 5.   10.31.2023:  Past Medical History:  Diagnosis Date   Cancer ([place])    Hypertension    Kidney calculus 2014;2013   Pneumonia    Shortness of breath     Past Surgical History:  Procedure Laterality Date   CATARACT EXTRACTION W/PHACO Right 02/13/2013   Procedure: CATARACT EXTRACTION RIGHT EYE (WITH PHACO) AND INTRAOCULAR LENS PLACEMENT  CDE=14.53;  Surgeon: Elta Guadeloupe T. Gershon Crane, MD;  Location: [facility];  Service: Ophthalmology;  Laterality: Right;   CATARACT EXTRACTION W/PHACO Left 02/27/2013   Procedure: CATARACT EXTRACTION PHACO AND INTRAOCULAR LENS PLACEMENT (IOC);  Surgeon: Elta Guadeloupe T. Gershon Crane, MD;  Location: [facility];  Service: Ophthalmology;  Laterality: Left;  CDE:12.54   COLONOSCOPY     JOINT REPLACEMENT Right 1993;1980   Right x2    Home Medications:  Allergies as of 02/16/2022       Reactions   Codeine Swelling   Penicillins Swelling        Medication List        Accurate as of February 16, 2022  8:30 AM. If you have any questions, ask your nurse or doctor.          alendronate 70 MG tablet Commonly known as: FOSAMAX TAKE 1 TABLET EVERY WEEK IN THE MORNING 30 MIN BEFORE EATING WITH AN 8OZ GLASS OF WATER (SIT UP 30 MIN)   aspirin EC 81 MG tablet  Take 81 mg by mouth daily.   benazepril 10 MG tablet Commonly known as: LOTENSIN Take 10 mg by mouth daily.   bicalutamide 50 MG tablet Commonly known as: Casodex Take 1 tablet (50 mg total) by mouth daily.   fish oil-omega-3 fatty acids 1000 MG capsule Take 1 g by mouth daily.   furosemide 20 MG tablet Commonly known as: LASIX Take 20-40 mg by mouth daily as needed for fluid.   hydrochlorothiazide 25 MG tablet Commonly known as: HYDRODIURIL Take 25 mg by mouth daily.   tamsulosin 0.4 MG Caps capsule Commonly known as: FLOMAX Take 0.4 mg by mouth daily after supper.        Allergies:  Allergies  Allergen Reactions   Codeine Swelling    Penicillins Swelling    No family history on file.  Social History:  reports that he has never smoked. He has never used smokeless tobacco. He reports that he does not drink alcohol and does not use drugs.  ROS: A complete review of systems was performed.  All systems are negative except for pertinent findings as noted.  Physical Exam:  Vital signs in last 24 hours: There were no vitals taken for this visit. Constitutional:  Alert and oriented, No acute distress Cardiovascular: Regular rate  Respiratory: Normal respiratory effort GI: Abdomen is soft, nontender, nondistended, no abdominal masses. No CVAT.  Genitourinary: Normal male phallus, testes are descended bilaterally and non-tender and without masses, scrotum is normal in appearance without lesions or masses, perineum is normal on inspection. Lymphatic: No lymphadenopathy Neurologic: Grossly intact, no focal deficits Psychiatric: Normal mood and affect  I have reviewed prior pt notes  I have reviewed notes from referring/previous physicians  I have reviewed urinalysis results  I have independently reviewed prior imaging  I have reviewed prior PSA results  I have reviewed prior urine culture   Impression/Assessment:  ***  Plan:  ***

## 2022-03-01 NOTE — Progress Notes (Signed)
History of Present Illness:   He had EBRT for PCA in 2003--unknown stage/grade. When first seen in 2015, he had been on androgen deprivation therapy with Lupron and his PSA was 2.65. Before his first meeting, he was cared for by Dr. Michela Pitcher.  After his first visit, in Feb 2016 he had a bone scan which revealed no evidence of osseous metastatic disease. Bone density study did reveal osteoporosis. He was placed on alendronate 70 mg weekly.  His PSA in Feb 2016 was 2.70 (approximately what it was previously iin October, 2015). Testosterone levels were at castrate level. He had a repeat PSA in May, 2016 that was 2.9.  Because of increasing PSA (16 on 3.1.2017), he was restarted on androgen deprivation therapy in March, 2017. At that time, bone scan was negative for osseous metastatic disease.  In July, 2017 was down to 4.6.  CT scan in Nov 2017 revealed stable pulmonary nodules, no evidence of intra-abdominal or skeletal metastatic disease.    12.4.2018: He is having some shoulder but no other bony pain. Most recent PSA is up to 13.7. It is been over a year since he has received a Lupron injection. He was restarted on Lupron at this appt.    4.9.2019: PSA 6.4. Lupron 30 mg administered    4.17.2019: bone scan negative for osseous metastatic disease.    4.26.2019: CT A/P negative for metastatic disease.    8.13.2019: No blood in urine/stool. No bony pain. Wt stable. PSA 5.5. He was given a 4 month Lupron injection.    12.17.2019: Lupron not given. PSA 6.2    4.28.2020: Most recent PSA 6.2  8.25.2020: PSA 20.4.   10.27.2020: Recent CT A/P, bone scan--NED.  5.25.2021: He returns today for f/u and to reinitiate ADT. This decision was based on recently elevated PSA, which was measured at 42.1 on 4.6.2021. ADT resumed with 4 mo eligard given today.  9.28.2021: PSA - 6.4 2.1.2022: PSA - 7.7 6.7.2022: PSA 8.9   10.11.2022: He is having no change in constitutional symptoms.  No blood in his urine or  stool.  He does have significant urgency, frequency and urgency incontinence.  He has significant mobility issues.  No bony pain.  Stable weight.  PSA 9.1, testosterone level castrate.   1.24.2023: He is here today for routine check, 3 weeks early for his leuprolide.  Does complain of losing some weight.  Still having mobility issues.  He uses a walker and barely gets around at home.    2.14.2023: Leuprolide 30 mg administered  2.18.2023--Bone scan- There are ill-defined patchy foci of interval increase in tracer uptake in the paraspinal region. Significance of this finding is not clear. This may be due to abnormal soft tissue uptake or uptake in sternum. Follow-up chest radiographs and CT if warranted should be considered.   There are foci of increased uptake in both shoulders, both elbows and left hip suggesting degenerative arthritis. Photopenia seen in the right hip suggests previous right hip arthroplasty.     6.27.2023: PSA 9, T level 5.  He is having urinary frequency, urgency, occasional dysuria.  No gross hematuria.  No abdominal pain.  No bony complaints.  11.14.2023: Biggest worry today is a lightness of his skin.  Mainly of his abdominal skin with some pruritus.  He has continued urinary frequency, urgency and near total urgency incontinence.  He has had no blood in his urine.  He has significantly limited mobility.  He also has nocturia.  No bony pain.  Stable weight, stable appetite.  He is taking his alendronate weekly.   Past Medical History:  Diagnosis Date   Cancer [facility])    Hypertension    Kidney calculus 2014;2013   Pneumonia    Shortness of breath     Past Surgical History:  Procedure Laterality Date   CATARACT EXTRACTION W/PHACO Right 02/13/2013   Procedure: CATARACT EXTRACTION RIGHT EYE (WITH PHACO) AND INTRAOCULAR LENS PLACEMENT  CDE=14.53;  Surgeon: Elta Guadeloupe T. Gershon Crane, MD;  Location: [facility];  Service: Ophthalmology;  Laterality: Right;   CATARACT EXTRACTION  W/PHACO Left 02/27/2013   Procedure: CATARACT EXTRACTION PHACO AND INTRAOCULAR LENS PLACEMENT (IOC);  Surgeon: Elta Guadeloupe T. Gershon Crane, MD;  Location: [facility];  Service: Ophthalmology;  Laterality: Left;  CDE:12.54   COLONOSCOPY     JOINT REPLACEMENT Right 1993;1980   Right x2    Home Medications:  Allergies as of 03/02/2022       Reactions   Codeine Swelling   Penicillins Swelling        Medication List        Accurate as of March 01, 2022  8:57 PM. If you have any questions, ask your nurse or doctor.          alendronate 70 MG tablet Commonly known as: FOSAMAX TAKE 1 TABLET EVERY WEEK IN THE MORNING 30 MIN BEFORE EATING WITH AN 8OZ GLASS OF WATER (SIT UP 30 MIN)   aspirin EC 81 MG tablet Take 81 mg by mouth daily.   benazepril 10 MG tablet Commonly known as: LOTENSIN Take 10 mg by mouth daily.   bicalutamide 50 MG tablet Commonly known as: Casodex Take 1 tablet (50 mg total) by mouth daily.   fish oil-omega-3 fatty acids 1000 MG capsule Take 1 g by mouth daily.   furosemide 20 MG tablet Commonly known as: LASIX Take 20-40 mg by mouth daily as needed for fluid.   hydrochlorothiazide 25 MG tablet Commonly known as: HYDRODIURIL Take 25 mg by mouth daily.   tamsulosin 0.4 MG Caps capsule Commonly known as: FLOMAX Take 0.4 mg by mouth daily after supper.        Allergies:  Allergies  Allergen Reactions   Codeine Swelling   Penicillins Swelling    No family history on file.  Social History:  reports that he has never smoked. He has never used smokeless tobacco. He reports that he does not drink alcohol and does not use drugs.  ROS: A complete review of systems was performed.  All systems are negative except for pertinent findings as noted.  Physical Exam:  Vital signs in last 24 hours: There were no vitals taken for this visit. Constitutional:  Alert and oriented, No acute distress Cardiovascular: Regular rate 4+ lower extremity edema to  knees. Respiratory: Normal respiratory effort Neurologic: Grossly intact, no focal deficits Psychiatric: Normal mood and affect  I have reviewed prior pt notes  I have reviewed bladder scan results-17 mL  I have independently reviewed prior imaging  I have reviewed prior PSA and testosterone results     Impression/Assessment:  1.  Prostate cancer, recurrent, on androgen deprivation therapy with slight increasing PSA trend  2.  Lower urinary tract symptoms-overactive bladder.  Emptying out well.  He has extremely limited mobility  3.  Nocturia  Plan:  1.  I will check opiate laboratories today  2.  He received 30 mg leuprolide injection  3.  I will have him come back in 4 months for routine check as well as  leuprolide

## 2022-03-02 ENCOUNTER — Encounter: Payer: Self-pay | Admitting: Urology

## 2022-03-02 ENCOUNTER — Ambulatory Visit (INDEPENDENT_AMBULATORY_CARE_PROVIDER_SITE_OTHER): Payer: Medicare Other | Admitting: Urology

## 2022-03-02 VITALS — BP 193/73 | HR 75

## 2022-03-02 DIAGNOSIS — C61 Malignant neoplasm of prostate: Secondary | ICD-10-CM | POA: Diagnosis not present

## 2022-03-02 DIAGNOSIS — R21 Rash and other nonspecific skin eruption: Secondary | ICD-10-CM

## 2022-03-02 DIAGNOSIS — R350 Frequency of micturition: Secondary | ICD-10-CM

## 2022-03-02 DIAGNOSIS — N3941 Urge incontinence: Secondary | ICD-10-CM | POA: Diagnosis not present

## 2022-03-02 DIAGNOSIS — R9721 Rising PSA following treatment for malignant neoplasm of prostate: Secondary | ICD-10-CM

## 2022-03-02 DIAGNOSIS — M818 Other osteoporosis without current pathological fracture: Secondary | ICD-10-CM

## 2022-03-02 IMAGING — NM NM BONE WHOLE BODY
2 series · 2 of 2 positions shown · non-contrast
Comparison: Previous studies including bone scan done on 07/05/2019
and CT done on 07/05/2019.

CLINICAL DATA: Prostate carcinoma

EXAM:
NUCLEAR MEDICINE WHOLE BODY BONE SCAN
TECHNIQUE: Whole body anterior and posterior images were obtained approximately
3 hours after intravenous injection of radiopharmaceutical.
RADIOPHARMACEUTICALS:  Twenty-two mCi Yechnetium-CCm MDP IV

[Series 1: whole body · 2.66mm/px · 1 of 1 slices shown (1 of 2)]
[im 1/1]
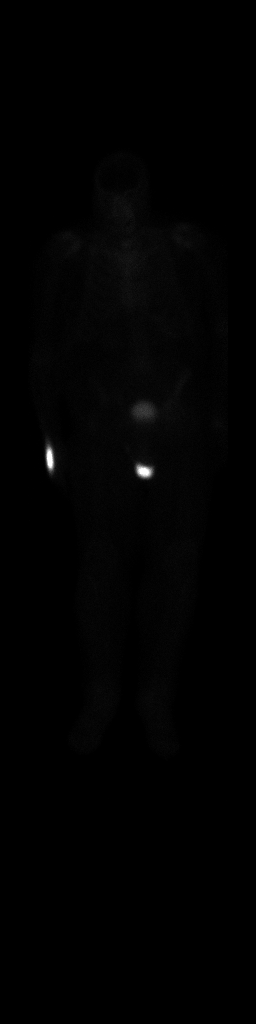

[Series 1: whole body · 2.66mm/px · 1 of 1 slices shown (2 of 2)]
[im 1/1]
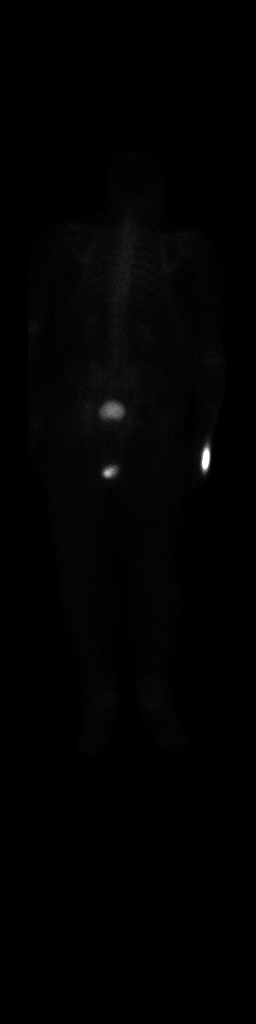

[2 of 2 positions shown; findings below may reference images not displayed]

FINDINGS: There are patchy foci of increased uptake in the parasternal region
which was not distinctly seen in the previous study. There is
subtle increased activity in the left hip, possibly due to
degenerative arthritis. There are patchy foci of increased uptake in
both shoulders and both elbows, possibly due to degenerative
arthritis. There is area of photopenia in the head and neck of right
femur. This may be related to previous right hip arthroplasty.
IMPRESSION: There are ill-defined patchy foci of interval increase in tracer
uptake in the paraspinal region. Significance of this finding is not
clear. This may be due to abnormal soft tissue uptake or uptake in
sternum. Follow-up chest radiographs and CT if warranted should be
considered.

There are foci of increased uptake in both shoulders, both elbows
and left hip suggesting degenerative arthritis. Photopenia seen in
the right hip suggests previous right hip arthroplasty.

## 2022-03-02 MED ORDER — LEUPROLIDE ACETATE (4 MONTH) 30 MG ~~LOC~~ KIT
30.0000 mg | PACK | Freq: Once | SUBCUTANEOUS | Status: AC
  Administered 2022-03-02: 30 mg via SUBCUTANEOUS

## 2022-03-02 NOTE — Progress Notes (Signed)
Eligard SubQ Injection   Due to Prostate Cancer patient is present today for a Eligard Injection.  Medication: Eligard 4 month Dose: 30 mg  Location: left  Lot: [id number] Exp: [id number]  Patient tolerated well, no complications were noted  Performed by: Gibson Ramp RN

## 2022-03-03 LAB — PSA: Prostate Specific Ag, Serum: 13.9 ng/mL — ABNORMAL HIGH (ref 0.0–4.0)

## 2022-03-09 ENCOUNTER — Telehealth: Payer: Self-pay

## 2022-03-09 NOTE — Telephone Encounter (Signed)
-----   Message from Franchot Gallo, MD sent at 03/09/2022  8:11 AM EST ----- Please let patient know that PSA is up slightly, give number.  Continue same follow-up schedule. ----- Message ----- From: Audie Box, CMA Sent: 03/03/2022   5:10 PM EST To: Franchot Gallo, MD  Please review

## 2022-03-09 NOTE — Telephone Encounter (Signed)
Pt not available, daughter aware to f/u as scheduled.

## 2022-03-18 ENCOUNTER — Other Ambulatory Visit: Payer: Self-pay | Admitting: Urology

## 2022-03-18 DIAGNOSIS — M818 Other osteoporosis without current pathological fracture: Secondary | ICD-10-CM

## 2022-04-28 ENCOUNTER — Other Ambulatory Visit: Payer: Self-pay | Admitting: Urology

## 2022-04-28 DIAGNOSIS — M818 Other osteoporosis without current pathological fracture: Secondary | ICD-10-CM

## 2022-05-20 DIAGNOSIS — R350 Frequency of micturition: Secondary | ICD-10-CM | POA: Diagnosis not present

## 2022-05-20 DIAGNOSIS — Z1331 Encounter for screening for depression: Secondary | ICD-10-CM | POA: Diagnosis not present

## 2022-05-20 DIAGNOSIS — I10 Essential (primary) hypertension: Secondary | ICD-10-CM | POA: Diagnosis not present

## 2022-05-20 DIAGNOSIS — E782 Mixed hyperlipidemia: Secondary | ICD-10-CM | POA: Diagnosis not present

## 2022-05-20 DIAGNOSIS — E6609 Other obesity due to excess calories: Secondary | ICD-10-CM | POA: Diagnosis not present

## 2022-05-20 DIAGNOSIS — Z0000 Encounter for general adult medical examination without abnormal findings: Secondary | ICD-10-CM | POA: Diagnosis not present

## 2022-05-20 DIAGNOSIS — Z6831 Body mass index (BMI) 31.0-31.9, adult: Secondary | ICD-10-CM | POA: Diagnosis not present

## 2022-05-21 ENCOUNTER — Other Ambulatory Visit: Payer: Self-pay | Admitting: Urology

## 2022-05-21 DIAGNOSIS — R350 Frequency of micturition: Secondary | ICD-10-CM | POA: Diagnosis not present

## 2022-05-21 DIAGNOSIS — M818 Other osteoporosis without current pathological fracture: Secondary | ICD-10-CM

## 2022-05-21 DIAGNOSIS — N390 Urinary tract infection, site not specified: Secondary | ICD-10-CM | POA: Diagnosis not present

## 2022-06-30 DIAGNOSIS — I739 Peripheral vascular disease, unspecified: Secondary | ICD-10-CM | POA: Diagnosis not present

## 2022-06-30 DIAGNOSIS — M79671 Pain in right foot: Secondary | ICD-10-CM | POA: Diagnosis not present

## 2022-06-30 DIAGNOSIS — M79675 Pain in left toe(s): Secondary | ICD-10-CM | POA: Diagnosis not present

## 2022-06-30 DIAGNOSIS — M79674 Pain in right toe(s): Secondary | ICD-10-CM | POA: Diagnosis not present

## 2022-06-30 DIAGNOSIS — M79672 Pain in left foot: Secondary | ICD-10-CM | POA: Diagnosis not present

## 2022-07-03 ENCOUNTER — Other Ambulatory Visit: Payer: Self-pay | Admitting: Urology

## 2022-07-03 DIAGNOSIS — M818 Other osteoporosis without current pathological fracture: Secondary | ICD-10-CM

## 2022-07-05 NOTE — Progress Notes (Signed)
History of Present Illness:   He had EBRT for PCA in 2003--unknown stage/grade. When first seen in 2015, he had been on androgen deprivation therapy with Lupron and his PSA was 2.65. Before his first meeting, he was cared for by Dr. Michela Pitcher.  After his first visit, in Feb 2016 he had a bone scan which revealed no evidence of osseous metastatic disease. Bone density study did reveal osteoporosis. He was placed on alendronate 70 mg weekly.  His PSA in Feb 2016 was 2.70 (approximately what it was previously iin October, 2015). Testosterone levels were at castrate level. He had a repeat PSA in May, 2016 that was 2.9.  Because of increasing PSA (16 on 3.1.2017), he was restarted on androgen deprivation therapy in March, 2017. At that time, bone scan was negative for osseous metastatic disease.  In July, 2017 was down to 4.6.  CT scan in Nov 2017 revealed stable pulmonary nodules, no evidence of intra-abdominal or skeletal metastatic disease.    12.4.2018: He is having some shoulder but no other bony pain. Most recent PSA is up to 13.7. It is been over a year since he has received a Lupron injection. He was restarted on Lupron at this appt.    4.9.2019: PSA 6.4. Lupron 30 mg administered    4.17.2019: bone scan negative for osseous metastatic disease.    4.26.2019: CT A/P negative for metastatic disease.    8.13.2019: No blood in urine/stool. No bony pain. Wt stable. PSA 5.5. He was given a 4 month Lupron injection.    12.17.2019: Lupron not given. PSA 6.2    4.28.2020: Most recent PSA 6.2  8.25.2020: PSA 20.4.   10.27.2020: Recent CT A/P, bone scan--NED.  5.25.2021: He returns today for f/u and to reinitiate ADT. This decision was based on recently elevated PSA, which was measured at 42.1 on 4.6.2021. ADT resumed with 4 mo eligard given today.  9.28.2021: PSA - 6.4 2.1.2022: PSA - 7.7 6.7.2022: PSA 8.9   10.11.2022: He is having no change in constitutional symptoms.  No blood in his urine or  stool.  He does have significant urgency, frequency and urgency incontinence.  He has significant mobility issues.  No bony pain.  Stable weight.  PSA 9.1, testosterone level castrate.   1.24.2023: He is here today for routine check, 3 weeks early for his leuprolide.  Does complain of losing some weight.  Still having mobility issues.  He uses a walker and barely gets around at home.    2.14.2023: Leuprolide 30 mg administered   2.18.2023--Bone scan- There are ill-defined patchy foci of interval increase in tracer uptake in the paraspinal region. Significance of this finding is not clear. This may be due to abnormal soft tissue uptake or uptake in sternum. Follow-up chest radiographs and CT if warranted should be considered.   There are foci of increased uptake in both shoulders, both elbows and left hip suggesting degenerative arthritis. Photopenia seen in the right hip suggests previous right hip arthroplasty.     6.27.2023: PSA 9, T level 5.  He is having urinary frequency, urgency, occasional dysuria.  No gross hematuria.  No abdominal pain.  No bony complaints.   11.14.2023: Biggest worry today is a lightness of his skin.  Mainly of his abdominal skin with some pruritus.  He has continued urinary frequency, urgency and near total urgency incontinence.  He has had no blood in his urine.  He has significantly limited mobility.  He also has nocturia.  No bony  pain.  Stable weight, stable appetite.  He is taking his alendronate weekly.   3.19.2024: Here for routine check.  He is declining in his overall health.  He is in a wheelchair now.  No specific bony pain.  Past Medical History:  Diagnosis Date   Cancer [facility])    Hypertension    Kidney calculus 2014;2013   Pneumonia    Shortness of breath     Past Surgical History:  Procedure Laterality Date   CATARACT EXTRACTION W/PHACO Right 02/13/2013   Procedure: CATARACT EXTRACTION RIGHT EYE (WITH PHACO) AND INTRAOCULAR LENS PLACEMENT   CDE=14.53;  Surgeon: Elta Guadeloupe T. Gershon Crane, MD;  Location: [facility];  Service: Ophthalmology;  Laterality: Right;   CATARACT EXTRACTION W/PHACO Left 02/27/2013   Procedure: CATARACT EXTRACTION PHACO AND INTRAOCULAR LENS PLACEMENT (IOC);  Surgeon: Elta Guadeloupe T. Gershon Crane, MD;  Location: [facility];  Service: Ophthalmology;  Laterality: Left;  CDE:12.54   COLONOSCOPY     JOINT REPLACEMENT Right 1993;1980   Right x2    Home Medications:  Allergies as of 07/06/2022       Reactions   Codeine Swelling   Penicillins Swelling        Medication List        Accurate as of July 05, 2022  8:31 PM. If you have any questions, ask your nurse or doctor.          alendronate 70 MG tablet Commonly known as: FOSAMAX TAKE 1 TABLET EVERY WEEK IN THE MORNING 30 MIN BEFORE EATING WITH AN 8OZ GLASS OF WATER (SIT UP 30 MIN)   aspirin EC 81 MG tablet Take 81 mg by mouth daily.   benazepril 10 MG tablet Commonly known as: LOTENSIN Take 10 mg by mouth daily.   bicalutamide 50 MG tablet Commonly known as: Casodex Take 1 tablet (50 mg total) by mouth daily.   fish oil-omega-3 fatty acids 1000 MG capsule Take 1 g by mouth daily.   furosemide 20 MG tablet Commonly known as: LASIX Take 20-40 mg by mouth daily as needed for fluid.   hydrochlorothiazide 25 MG tablet Commonly known as: HYDRODIURIL Take 25 mg by mouth daily.   tamsulosin 0.4 MG Caps capsule Commonly known as: FLOMAX Take 0.4 mg by mouth daily after supper.        Allergies:  Allergies  Allergen Reactions   Codeine Swelling   Penicillins Swelling    No family history on file.  Social History:  reports that he has never smoked. He has never used smokeless tobacco. He reports that he does not drink alcohol and does not use drugs.  ROS: A complete review of systems was performed.  All systems are negative except for pertinent findings as noted.  Physical Exam:  Vital signs in last 24 hours: There were no vitals taken for this  visit. Constitutional:  Alert and oriented, No acute distress Cardiovascular: Regular rate.  He does have 3-4+ bilateral lower extremity edema. Respiratory: Normal respiratory effort Neurologic: Grossly intact, no focal deficits Psychiatric: Normal mood and affect  I have reviewed prior pt notes  I have reviewed urinalysis results  I have independently reviewed prior imaging--dexascan  I have reviewed prior PSA and testosterone results    Impression/Assessment:  Progressive prostate cancer following radiotherapy 21 years ago, on androgen deprivation therapy for about 10 years.  Last bone imaging was a year ago and was negative  Plan:  I will hold off on administering Lupron today-more than likely, he will recover from his androgen  deprivation therapy and we can just follow his testosterone and PSA  I recommend bone scan to make sure that he does not have any impending skeletal related events-he wants to hold off on this and get back with me if he wants to proceed  Otherwise, I will have him come back in about 3 months for check

## 2022-07-06 ENCOUNTER — Encounter: Payer: Self-pay | Admitting: Urology

## 2022-07-06 ENCOUNTER — Ambulatory Visit (INDEPENDENT_AMBULATORY_CARE_PROVIDER_SITE_OTHER): Payer: Medicare Other | Admitting: Urology

## 2022-07-06 VITALS — BP 128/57 | HR 68 | Ht 67.0 in | Wt 215.0 lb

## 2022-07-06 DIAGNOSIS — R9721 Rising PSA following treatment for malignant neoplasm of prostate: Secondary | ICD-10-CM | POA: Diagnosis not present

## 2022-07-06 DIAGNOSIS — M818 Other osteoporosis without current pathological fracture: Secondary | ICD-10-CM | POA: Diagnosis not present

## 2022-07-06 DIAGNOSIS — T387X5A Adverse effect of androgens and anabolic congeners, initial encounter: Secondary | ICD-10-CM

## 2022-07-06 DIAGNOSIS — Z8546 Personal history of malignant neoplasm of prostate: Secondary | ICD-10-CM

## 2022-07-06 DIAGNOSIS — C61 Malignant neoplasm of prostate: Secondary | ICD-10-CM

## 2022-07-06 LAB — URINALYSIS, ROUTINE W REFLEX MICROSCOPIC
Bilirubin, UA: NEGATIVE
Glucose, UA: NEGATIVE
Ketones, UA: NEGATIVE
Leukocytes,UA: NEGATIVE
Nitrite, UA: NEGATIVE
Protein,UA: NEGATIVE
RBC, UA: NEGATIVE
Specific Gravity, UA: 1.015 (ref 1.005–1.030)
Urobilinogen, Ur: 1 mg/dL (ref 0.2–1.0)
pH, UA: 6 (ref 5.0–7.5)

## 2022-07-06 MED ORDER — LEUPROLIDE ACETATE (4 MONTH) 30 MG ~~LOC~~ KIT: 30.0000 mg | PACK | Freq: Once | SUBCUTANEOUS | Status: DC

## 2022-07-06 NOTE — Addendum Note (Signed)
Addended by: Audie Box on: 07/06/2022 02:07 PM   Modules accepted: Orders

## 2022-07-07 LAB — PSA: Prostate Specific Ag, Serum: 10.4 ng/mL — ABNORMAL HIGH (ref 0.0–4.0)

## 2022-07-08 ENCOUNTER — Telehealth: Payer: Self-pay

## 2022-07-08 NOTE — Telephone Encounter (Signed)
-----   Message from Franchot Gallo, MD sent at 07/08/2022  1:18 PM EDT ----- Please call family-good news, PSA a bit lower, given number. ----- Message ----- From: Sherrilyn Rist, CMA Sent: 07/07/2022   2:39 PM EDT To: Franchot Gallo, MD  Please review

## 2022-07-08 NOTE — Telephone Encounter (Signed)
Tried calling patient with no answer, left vm for return call 

## 2022-07-09 ENCOUNTER — Other Ambulatory Visit: Payer: Self-pay | Admitting: Urology

## 2022-07-09 DIAGNOSIS — M818 Other osteoporosis without current pathological fracture: Secondary | ICD-10-CM

## 2022-07-09 NOTE — Telephone Encounter (Signed)
-----   Message from Franchot Gallo, MD sent at 07/08/2022  1:18 PM EDT ----- Please call family-good news, PSA a bit lower, given number. ----- Message ----- From: Sherrilyn Rist, CMA Sent: 07/07/2022   2:39 PM EDT To: Franchot Gallo, MD  Please review

## 2022-07-09 NOTE — Telephone Encounter (Signed)
Letter sent out making patient aware.

## 2022-08-24 ENCOUNTER — Emergency Department (HOSPITAL_COMMUNITY): Payer: Medicare Other

## 2022-08-24 ENCOUNTER — Inpatient Hospital Stay (HOSPITAL_COMMUNITY)
Admission: EM | Admit: 2022-08-24 | Discharge: 2022-09-10 | DRG: 854 | Disposition: A | Discharge status: Rehabilitation Facility | Payer: Medicare Other | Attending: Internal Medicine | Admitting: Internal Medicine

## 2022-08-24 ENCOUNTER — Encounter (HOSPITAL_COMMUNITY): Payer: Self-pay | Admitting: *Deleted

## 2022-08-24 ENCOUNTER — Other Ambulatory Visit: Payer: Self-pay

## 2022-08-24 DIAGNOSIS — M869 Osteomyelitis, unspecified: Secondary | ICD-10-CM

## 2022-08-24 DIAGNOSIS — I480 Paroxysmal atrial fibrillation: Secondary | ICD-10-CM | POA: Insufficient documentation

## 2022-08-24 DIAGNOSIS — I70235 Atherosclerosis of native arteries of right leg with ulceration of other part of foot: Secondary | ICD-10-CM | POA: Diagnosis not present

## 2022-08-24 DIAGNOSIS — R609 Edema, unspecified: Secondary | ICD-10-CM | POA: Diagnosis not present

## 2022-08-24 DIAGNOSIS — R5381 Other malaise: Secondary | ICD-10-CM | POA: Diagnosis not present

## 2022-08-24 DIAGNOSIS — R7989 Other specified abnormal findings of blood chemistry: Secondary | ICD-10-CM | POA: Diagnosis present

## 2022-08-24 DIAGNOSIS — R109 Unspecified abdominal pain: Secondary | ICD-10-CM | POA: Diagnosis not present

## 2022-08-24 DIAGNOSIS — E669 Obesity, unspecified: Secondary | ICD-10-CM | POA: Diagnosis present

## 2022-08-24 DIAGNOSIS — K5641 Fecal impaction: Secondary | ICD-10-CM | POA: Diagnosis present

## 2022-08-24 DIAGNOSIS — M609 Myositis, unspecified: Secondary | ICD-10-CM | POA: Diagnosis present

## 2022-08-24 DIAGNOSIS — L97516 Non-pressure chronic ulcer of other part of right foot with bone involvement without evidence of necrosis: Secondary | ICD-10-CM | POA: Diagnosis present

## 2022-08-24 DIAGNOSIS — I4819 Other persistent atrial fibrillation: Secondary | ICD-10-CM | POA: Diagnosis present

## 2022-08-24 DIAGNOSIS — E871 Hypo-osmolality and hyponatremia: Secondary | ICD-10-CM | POA: Diagnosis not present

## 2022-08-24 DIAGNOSIS — I739 Peripheral vascular disease, unspecified: Secondary | ICD-10-CM | POA: Diagnosis not present

## 2022-08-24 DIAGNOSIS — A4189 Other specified sepsis: Principal | ICD-10-CM | POA: Diagnosis present

## 2022-08-24 DIAGNOSIS — A499 Bacterial infection, unspecified: Secondary | ICD-10-CM | POA: Diagnosis present

## 2022-08-24 DIAGNOSIS — Z8546 Personal history of malignant neoplasm of prostate: Secondary | ICD-10-CM

## 2022-08-24 DIAGNOSIS — M7989 Other specified soft tissue disorders: Secondary | ICD-10-CM | POA: Diagnosis not present

## 2022-08-24 DIAGNOSIS — I96 Gangrene, not elsewhere classified: Secondary | ICD-10-CM | POA: Diagnosis not present

## 2022-08-24 DIAGNOSIS — L89312 Pressure ulcer of right buttock, stage 2: Secondary | ICD-10-CM | POA: Diagnosis present

## 2022-08-24 DIAGNOSIS — Z1152 Encounter for screening for COVID-19: Secondary | ICD-10-CM

## 2022-08-24 DIAGNOSIS — Z7901 Long term (current) use of anticoagulants: Secondary | ICD-10-CM | POA: Diagnosis not present

## 2022-08-24 DIAGNOSIS — R54 Age-related physical debility: Secondary | ICD-10-CM | POA: Diagnosis present

## 2022-08-24 DIAGNOSIS — Z961 Presence of intraocular lens: Secondary | ICD-10-CM | POA: Diagnosis present

## 2022-08-24 DIAGNOSIS — Z7982 Long term (current) use of aspirin: Secondary | ICD-10-CM

## 2022-08-24 DIAGNOSIS — L89626 Pressure-induced deep tissue damage of left heel: Secondary | ICD-10-CM | POA: Diagnosis not present

## 2022-08-24 DIAGNOSIS — R937 Abnormal findings on diagnostic imaging of other parts of musculoskeletal system: Secondary | ICD-10-CM | POA: Diagnosis present

## 2022-08-24 DIAGNOSIS — R600 Localized edema: Secondary | ICD-10-CM | POA: Diagnosis not present

## 2022-08-24 DIAGNOSIS — N3289 Other specified disorders of bladder: Secondary | ICD-10-CM | POA: Diagnosis not present

## 2022-08-24 DIAGNOSIS — J90 Pleural effusion, not elsewhere classified: Secondary | ICD-10-CM | POA: Diagnosis not present

## 2022-08-24 DIAGNOSIS — Z96651 Presence of right artificial knee joint: Secondary | ICD-10-CM | POA: Diagnosis present

## 2022-08-24 DIAGNOSIS — E8770 Fluid overload, unspecified: Secondary | ICD-10-CM | POA: Diagnosis present

## 2022-08-24 DIAGNOSIS — M8580 Other specified disorders of bone density and structure, unspecified site: Secondary | ICD-10-CM | POA: Diagnosis present

## 2022-08-24 DIAGNOSIS — D649 Anemia, unspecified: Secondary | ICD-10-CM | POA: Diagnosis not present

## 2022-08-24 DIAGNOSIS — E875 Hyperkalemia: Secondary | ICD-10-CM | POA: Diagnosis not present

## 2022-08-24 DIAGNOSIS — R001 Bradycardia, unspecified: Secondary | ICD-10-CM | POA: Diagnosis present

## 2022-08-24 DIAGNOSIS — N390 Urinary tract infection, site not specified: Secondary | ICD-10-CM | POA: Diagnosis not present

## 2022-08-24 DIAGNOSIS — Z87442 Personal history of urinary calculi: Secondary | ICD-10-CM | POA: Diagnosis not present

## 2022-08-24 DIAGNOSIS — C61 Malignant neoplasm of prostate: Secondary | ICD-10-CM | POA: Diagnosis not present

## 2022-08-24 DIAGNOSIS — Z79899 Other long term (current) drug therapy: Secondary | ICD-10-CM

## 2022-08-24 DIAGNOSIS — J40 Bronchitis, not specified as acute or chronic: Secondary | ICD-10-CM | POA: Diagnosis not present

## 2022-08-24 DIAGNOSIS — R6520 Severe sepsis without septic shock: Secondary | ICD-10-CM | POA: Diagnosis present

## 2022-08-24 DIAGNOSIS — L97519 Non-pressure chronic ulcer of other part of right foot with unspecified severity: Secondary | ICD-10-CM | POA: Insufficient documentation

## 2022-08-24 DIAGNOSIS — N179 Acute kidney failure, unspecified: Secondary | ICD-10-CM | POA: Diagnosis not present

## 2022-08-24 DIAGNOSIS — E08621 Diabetes mellitus due to underlying condition with foot ulcer: Secondary | ICD-10-CM | POA: Diagnosis not present

## 2022-08-24 DIAGNOSIS — M8618 Other acute osteomyelitis, other site: Secondary | ICD-10-CM | POA: Diagnosis not present

## 2022-08-24 DIAGNOSIS — R350 Frequency of micturition: Secondary | ICD-10-CM | POA: Diagnosis not present

## 2022-08-24 DIAGNOSIS — L97511 Non-pressure chronic ulcer of other part of right foot limited to breakdown of skin: Secondary | ICD-10-CM | POA: Diagnosis not present

## 2022-08-24 DIAGNOSIS — G629 Polyneuropathy, unspecified: Secondary | ICD-10-CM | POA: Diagnosis present

## 2022-08-24 DIAGNOSIS — M86171 Other acute osteomyelitis, right ankle and foot: Secondary | ICD-10-CM | POA: Diagnosis present

## 2022-08-24 DIAGNOSIS — G4700 Insomnia, unspecified: Secondary | ICD-10-CM | POA: Diagnosis present

## 2022-08-24 DIAGNOSIS — Z7983 Long term (current) use of bisphosphonates: Secondary | ICD-10-CM

## 2022-08-24 DIAGNOSIS — K5900 Constipation, unspecified: Secondary | ICD-10-CM | POA: Diagnosis not present

## 2022-08-24 DIAGNOSIS — L89152 Pressure ulcer of sacral region, stage 2: Secondary | ICD-10-CM | POA: Diagnosis present

## 2022-08-24 DIAGNOSIS — Z9841 Cataract extraction status, right eye: Secondary | ICD-10-CM | POA: Diagnosis not present

## 2022-08-24 DIAGNOSIS — N3001 Acute cystitis with hematuria: Secondary | ICD-10-CM

## 2022-08-24 DIAGNOSIS — D638 Anemia in other chronic diseases classified elsewhere: Secondary | ICD-10-CM | POA: Diagnosis not present

## 2022-08-24 DIAGNOSIS — D62 Acute posthemorrhagic anemia: Secondary | ICD-10-CM | POA: Diagnosis present

## 2022-08-24 DIAGNOSIS — Z923 Personal history of irradiation: Secondary | ICD-10-CM | POA: Diagnosis not present

## 2022-08-24 DIAGNOSIS — R0602 Shortness of breath: Secondary | ICD-10-CM | POA: Diagnosis not present

## 2022-08-24 DIAGNOSIS — Z9842 Cataract extraction status, left eye: Secondary | ICD-10-CM | POA: Diagnosis not present

## 2022-08-24 DIAGNOSIS — E872 Acidosis, unspecified: Secondary | ICD-10-CM | POA: Diagnosis present

## 2022-08-24 DIAGNOSIS — E8779 Other fluid overload: Secondary | ICD-10-CM | POA: Diagnosis not present

## 2022-08-24 DIAGNOSIS — N3000 Acute cystitis without hematuria: Secondary | ICD-10-CM | POA: Diagnosis present

## 2022-08-24 DIAGNOSIS — R7881 Bacteremia: Secondary | ICD-10-CM

## 2022-08-24 DIAGNOSIS — D539 Nutritional anemia, unspecified: Secondary | ICD-10-CM | POA: Diagnosis not present

## 2022-08-24 DIAGNOSIS — B9689 Other specified bacterial agents as the cause of diseases classified elsewhere: Secondary | ICD-10-CM | POA: Diagnosis present

## 2022-08-24 DIAGNOSIS — E1159 Type 2 diabetes mellitus with other circulatory complications: Secondary | ICD-10-CM | POA: Diagnosis not present

## 2022-08-24 DIAGNOSIS — R531 Weakness: Secondary | ICD-10-CM | POA: Diagnosis not present

## 2022-08-24 DIAGNOSIS — D519 Vitamin B12 deficiency anemia, unspecified: Secondary | ICD-10-CM | POA: Diagnosis not present

## 2022-08-24 DIAGNOSIS — I10 Essential (primary) hypertension: Secondary | ICD-10-CM | POA: Diagnosis present

## 2022-08-24 DIAGNOSIS — R509 Fever, unspecified: Secondary | ICD-10-CM

## 2022-08-24 DIAGNOSIS — A419 Sepsis, unspecified organism: Secondary | ICD-10-CM | POA: Diagnosis not present

## 2022-08-24 DIAGNOSIS — K5901 Slow transit constipation: Secondary | ICD-10-CM | POA: Diagnosis not present

## 2022-08-24 DIAGNOSIS — N400 Enlarged prostate without lower urinary tract symptoms: Secondary | ICD-10-CM | POA: Diagnosis present

## 2022-08-24 DIAGNOSIS — N401 Enlarged prostate with lower urinary tract symptoms: Secondary | ICD-10-CM | POA: Diagnosis not present

## 2022-08-24 DIAGNOSIS — L97514 Non-pressure chronic ulcer of other part of right foot with necrosis of bone: Secondary | ICD-10-CM | POA: Diagnosis not present

## 2022-08-24 DIAGNOSIS — I4891 Unspecified atrial fibrillation: Secondary | ICD-10-CM | POA: Diagnosis not present

## 2022-08-24 DIAGNOSIS — Z880 Allergy status to penicillin: Secondary | ICD-10-CM

## 2022-08-24 DIAGNOSIS — E538 Deficiency of other specified B group vitamins: Secondary | ICD-10-CM | POA: Diagnosis not present

## 2022-08-24 DIAGNOSIS — L97909 Non-pressure chronic ulcer of unspecified part of unspecified lower leg with unspecified severity: Secondary | ICD-10-CM | POA: Diagnosis not present

## 2022-08-24 DIAGNOSIS — Z885 Allergy status to narcotic agent status: Secondary | ICD-10-CM

## 2022-08-24 DIAGNOSIS — E46 Unspecified protein-calorie malnutrition: Secondary | ICD-10-CM | POA: Diagnosis present

## 2022-08-24 HISTORY — DX: Malignant neoplasm of prostate: C61

## 2022-08-24 LAB — CBC WITH DIFFERENTIAL/PLATELET
Abs Immature Granulocytes: 0.01 10*3/uL (ref 0.00–0.07)
Basophils Absolute: 0.1 10*3/uL (ref 0.0–0.1)
Basophils Relative: 1 %
Eosinophils Absolute: 0.1 10*3/uL (ref 0.0–0.5)
Eosinophils Relative: 1 %
HCT: 36.4 % — ABNORMAL LOW (ref 39.0–52.0)
Hemoglobin: 11.5 g/dL — ABNORMAL LOW (ref 13.0–17.0)
Immature Granulocytes: 0 %
Lymphocytes Relative: 37 %
Lymphs Abs: 2.2 10*3/uL (ref 0.7–4.0)
MCH: 33.6 pg (ref 26.0–34.0)
MCHC: 31.6 g/dL (ref 30.0–36.0)
MCV: 106.4 fL — ABNORMAL HIGH (ref 80.0–100.0)
Monocytes Absolute: 0.4 10*3/uL (ref 0.1–1.0)
Monocytes Relative: 7 %
Neutro Abs: 3.2 10*3/uL (ref 1.7–7.7)
Neutrophils Relative %: 54 %
Platelets: 289 10*3/uL (ref 150–400)
RBC: 3.42 MIL/uL — ABNORMAL LOW (ref 4.22–5.81)
RDW: 14.8 % (ref 11.5–15.5)
WBC: 6 10*3/uL (ref 4.0–10.5)
nRBC: 0 % (ref 0.0–0.2)

## 2022-08-24 LAB — URINALYSIS, ROUTINE W REFLEX MICROSCOPIC
Bilirubin Urine: NEGATIVE
Glucose, UA: NEGATIVE mg/dL
Ketones, ur: 5 mg/dL — AB
Leukocytes,Ua: NEGATIVE
Nitrite: NEGATIVE
Protein, ur: NEGATIVE mg/dL
Specific Gravity, Urine: 1.012 (ref 1.005–1.030)
pH: 6 (ref 5.0–8.0)

## 2022-08-24 LAB — COMPREHENSIVE METABOLIC PANEL
ALT: 13 U/L (ref 0–44)
AST: 22 U/L (ref 15–41)
Albumin: 3.7 g/dL (ref 3.5–5.0)
Alkaline Phosphatase: 64 U/L (ref 38–126)
Anion gap: 13 (ref 5–15)
BUN: 27 mg/dL — ABNORMAL HIGH (ref 8–23)
CO2: 23 mmol/L (ref 22–32)
Calcium: 9.3 mg/dL (ref 8.9–10.3)
Chloride: 100 mmol/L (ref 98–111)
Creatinine, Ser: 1.08 mg/dL (ref 0.61–1.24)
GFR, Estimated: >60 mL/min (ref >60)
Glucose, Bld: 97 mg/dL (ref 70–99)
Potassium: 3.9 mmol/L (ref 3.5–5.1)
Sodium: 136 mmol/L (ref 135–145)
Total Bilirubin: 1.2 mg/dL (ref 0.3–1.2)
Total Protein: 7.5 g/dL (ref 6.5–8.1)

## 2022-08-24 LAB — RESP PANEL BY RT-PCR (RSV, FLU A&B, COVID)  RVPGX2
Influenza A by PCR: NEGATIVE
Influenza B by PCR: NEGATIVE
Resp Syncytial Virus by PCR: NEGATIVE
SARS Coronavirus 2 by RT PCR: NEGATIVE

## 2022-08-24 LAB — MAGNESIUM: Magnesium: 2.1 mg/dL (ref 1.7–2.4)

## 2022-08-24 LAB — BRAIN NATRIURETIC PEPTIDE: B Natriuretic Peptide: 160 pg/mL — ABNORMAL HIGH (ref 0.0–100.0)

## 2022-08-24 LAB — CULTURE, BLOOD (ROUTINE X 2)

## 2022-08-24 LAB — LACTIC ACID, PLASMA: Lactic Acid, Venous: 2.7 mmol/L (ref 0.5–1.9)

## 2022-08-24 LAB — TROPONIN I (HIGH SENSITIVITY)
Troponin I (High Sensitivity): 24 ng/L — ABNORMAL HIGH (ref <18)
Troponin I (High Sensitivity): 25 ng/L — ABNORMAL HIGH (ref <18)

## 2022-08-24 MED ORDER — METRONIDAZOLE 500 MG/100ML IV SOLN
500.0000 mg | Freq: Once | INTRAVENOUS | Status: AC
  Administered 2022-08-24: 500 mg via INTRAVENOUS
  Filled 2022-08-24: qty 100

## 2022-08-24 MED ORDER — VANCOMYCIN HCL IN DEXTROSE 1-5 GM/200ML-% IV SOLN: 1000.0000 mg | Freq: Once | INTRAVENOUS | Status: DC

## 2022-08-24 MED ORDER — SODIUM CHLORIDE 0.9 % IV SOLN: 2.0000 g | Freq: Once | INTRAVENOUS | Status: DC

## 2022-08-24 MED ORDER — IOHEXOL 350 MG/ML SOLN
100.0000 mL | Freq: Once | INTRAVENOUS | Status: AC | PRN
  Administered 2022-08-24: 100 mL via INTRAVENOUS

## 2022-08-24 MED ORDER — ACETAMINOPHEN 325 MG PO TABS
650.0000 mg | ORAL_TABLET | Freq: Once | ORAL | Status: AC
  Administered 2022-08-24: 650 mg via ORAL
  Filled 2022-08-24: qty 2

## 2022-08-24 MED ORDER — LACTATED RINGERS IV BOLUS (SEPSIS)
1000.0000 mL | Freq: Once | INTRAVENOUS | Status: AC
  Administered 2022-08-24: 1000 mL via INTRAVENOUS

## 2022-08-24 MED ORDER — SODIUM CHLORIDE 0.9 % IV SOLN
2.0000 g | Freq: Two times a day (BID) | INTRAVENOUS | Status: DC
  Administered 2022-08-24 – 2022-08-26 (×4): 2 g via INTRAVENOUS
  Filled 2022-08-24 (×4): qty 12.5

## 2022-08-24 MED ORDER — VANCOMYCIN HCL IN DEXTROSE 1-5 GM/200ML-% IV SOLN
1000.0000 mg | INTRAVENOUS | Status: DC
  Administered 2022-08-25 – 2022-08-29 (×5): 1000 mg via INTRAVENOUS
  Filled 2022-08-24 (×6): qty 200

## 2022-08-24 MED ORDER — LACTATED RINGERS IV SOLN: INTRAVENOUS | Status: DC

## 2022-08-24 MED ORDER — VANCOMYCIN HCL 1250 MG/250ML IV SOLN
1250.0000 mg | Freq: Once | INTRAVENOUS | Status: AC
  Administered 2022-08-24: 1250 mg via INTRAVENOUS
  Filled 2022-08-24: qty 250

## 2022-08-24 MED ORDER — LACTATED RINGERS IV BOLUS: 500.0000 mL | Freq: Once | INTRAVENOUS | Status: DC

## 2022-08-24 NOTE — ED Notes (Signed)
Patient transported to CT 

## 2022-08-24 NOTE — ED Provider Notes (Incomplete)
[place] EMERGENCY DEPARTMENT AT [facility] Provider Note   CSN: [id number] Arrival date & time: 08/24/22  1401     History {Add pertinent medical, surgical, social history, OB history to HPI:1} Chief Complaint  Patient presents with  . Weakness    Jeffie Pollock Sr. is a 89 y.o. male.  89 year old male presents with his daughter with complaint of increasing lower extremity weakness and decreased ability to ambulate without assistance for about 4 weeks. History of prostate cancer, hypertension. No history of diabetes. Patient with ulcerated area on lateral aspect of right foot, and well as a developing sacral ulcer. Daughter reports patient is constantly sitting due to decreased ability to ambulate. He requires assistance with moving and ADL's. Daughter indicates patient with increasing shortness of breath with activity. Patient denies chest pain, abdominal pain, nausea, vomiting, diarrhea. Patient is HOH.   The history is provided by the patient and a relative.  Weakness Severity:  Moderate Onset quality:  Gradual Duration:  4 weeks Timing:  Constant Progression:  Worsening Chronicity:  New Associated symptoms: difficulty walking and shortness of breath   Associated symptoms: no abdominal pain, no chest pain, no loss of consciousness, no nausea, no near-syncope, no stroke symptoms, no syncope and no vomiting   Risk factors: family hx of stroke        Home Medications Prior to Admission medications   Medication Sig Start Date End Date Taking? Authorizing Provider  alendronate (FOSAMAX) 70 MG tablet TAKE 1 TABLET EVERY WEEK IN THE MORNING 30 MIN BEFORE EATING WITH AN 8OZ GLASS OF WATER (SIT UP 30 MIN) 07/12/22   Marcine Matar, MD  aspirin EC 81 MG tablet Take 81 mg by mouth daily.    [provider]  benazepril (LOTENSIN) 10 MG tablet Take 10 mg by mouth daily. 05/15/19   [provider]  bicalutamide (CASODEX) 50 MG tablet Take 1 tablet (50 mg  total) by mouth daily. 09/11/19   Marcine Matar, MD  fish oil-omega-3 fatty acids 1000 MG capsule Take 1 g by mouth daily.    [provider]  furosemide (LASIX) 20 MG tablet Take 20-40 mg by mouth daily as needed for fluid.  07/09/19   [provider]  hydrochlorothiazide (HYDRODIURIL) 25 MG tablet Take 25 mg by mouth daily.    [provider]  tamsulosin (FLOMAX) 0.4 MG CAPS capsule Take 0.4 mg by mouth daily after supper.    [provider]      Allergies    Codeine and Penicillins    Review of Systems   Review of Systems  Constitutional:  Positive for activity change.  Respiratory:  Positive for shortness of breath.   Cardiovascular:  Negative for chest pain, syncope and near-syncope.  Gastrointestinal:  Negative for abdominal pain, nausea and vomiting.  Skin:  Positive for wound. Negative for rash.  Neurological:  Positive for weakness. Negative for loss of consciousness.    Physical Exam Updated Vital Signs BP (!) 177/86   Pulse 64   Resp 20   Ht 5\' 7"  (1.702 m)   Wt 82.1 kg   SpO2 100%   BMI 28.35 kg/m  Physical Exam HENT:     Head: Normocephalic.     Nose: Nose normal.  Eyes:     Conjunctiva/sclera: Conjunctivae normal.  Cardiovascular:     Rate and Rhythm: Normal rate.  Pulmonary:     Effort: Pulmonary effort is normal.  Musculoskeletal:  General: No deformity.  Skin:    General: Skin is warm and dry.  Neurological:     Mental Status: He is alert and oriented to person, place, and time.     Motor: Weakness present.  Psychiatric:        Mood and Affect: Mood normal.        Behavior: Behavior normal.     ED Results / Procedures / Treatments   Labs (all labs ordered are listed, but only abnormal results are displayed) Labs Reviewed  COMPREHENSIVE METABOLIC PANEL - Abnormal; Notable for the following components:      Result Value   BUN 27 (*)    All other components within normal limits  CBC WITH  DIFFERENTIAL/PLATELET - Abnormal; Notable for the following components:   RBC 3.42 (*)    Hemoglobin 11.5 (*)    HCT 36.4 (*)    MCV 106.4 (*)    All other components within normal limits  URINALYSIS, ROUTINE W REFLEX MICROSCOPIC - Abnormal; Notable for the following components:   APPearance HAZY (*)    Hgb urine dipstick MODERATE (*)    Ketones, ur 5 (*)    Bacteria, UA RARE (*)    All other components within normal limits  BRAIN NATRIURETIC PEPTIDE - Abnormal; Notable for the following components:   B Natriuretic Peptide 160.0 (*)    All other components within normal limits  LACTIC ACID, PLASMA - Abnormal; Notable for the following components:   Lactic Acid, Venous 2.7 (*)    All other components within normal limits  TROPONIN I (HIGH SENSITIVITY) - Abnormal; Notable for the following components:   Troponin I (High Sensitivity) 24 (*)    All other components within normal limits  TROPONIN I (HIGH SENSITIVITY) - Abnormal; Notable for the following components:   Troponin I (High Sensitivity) 25 (*)    All other components within normal limits  RESP PANEL BY RT-PCR (RSV, FLU A&B, COVID)  RVPGX2  CULTURE, BLOOD (ROUTINE X 2)  CULTURE, BLOOD (ROUTINE X 2)  MAGNESIUM  LACTIC ACID, PLASMA    EKG None  Radiology DG Chest Port 1 View  Result Date: 08/24/2022 CLINICAL DATA:  Dyspnea on exertion, shortness of breath, and weakness for few days EXAM: PORTABLE CHEST 1 VIEW COMPARISON:  Portable exam 1527 hours compared to 07/24/2019 FINDINGS: Normal heart size, mediastinal contours, and pulmonary vascularity. Atherosclerotic calcification aorta. Lungs clear. No pulmonary infiltrate, pleural effusion, or pneumothorax. Bones demineralized. IMPRESSION: No acute abnormalities. Aortic Atherosclerosis (ICD10-I70.0). Electronically Signed   By: Ulyses Southward M.D.   On: 08/24/2022 15:35    Procedures Procedures  {Document cardiac monitor, telemetry assessment procedure when  appropriate:1}  Medications Ordered in ED Medications - No data to display  ED Course/ Medical Decision Making/ A&P   {   Click here for ABCD2, HEART and other calculatorsREFRESH Note before signing :1}                          Medical Decision Making Amount and/or Complexity of Data Reviewed Labs: ordered. Radiology: ordered.  Risk Prescription drug management.   Patient developed fever while in ED. Currently has rigors. Lactate of 2.7. Sepsis work-up, fluids, and antibiotics initiated.CXR without indication of pneumonia or effusion. Urinalysis without nitrites or leukocytes. Negative for COVID, influenza, and RSV. Will request admission for undifferentiated sepsis.  {Document critical care time when appropriate:1} {Document review of labs and clinical decision tools ie heart score, Chads2Vasc2 etc:1}  {  Document your independent review of radiology images, and any outside records:1} {Document your discussion with family members, caretakers, and with consultants:1} {Document social determinants of health affecting pt's care:1} {Document your decision making why or why not admission, treatments were needed:1} Final Clinical Impression(s) / ED Diagnoses Final diagnoses:  None    Rx / DC Orders ED Discharge Orders     None

## 2022-08-24 NOTE — ED Triage Notes (Signed)
Pt brought in by [facility] from home with c/o generalized weakness x few days. No unilateral weakness per EMS. CBG 125, BP 170/70 for EMS.

## 2022-08-24 NOTE — Progress Notes (Addendum)
Pharmacy Antibiotic Note  [name] Sr. is a 89 y.o. male admitted on 08/24/2022 with possible infection of RLE foot wound.  Pharmacy has been consulted for vancomycin, cefepime dosing. Patient is also on metronidazole.   ClCr 45 ml/min. WBC wnl. Tmax 100.9. CXR with no acute abnormalities. CT scan pending.   5/7 Vancomycin 1000mg  Q 24 hr Scr used: 1.08 mg/dL Weight: 16.1 kg Vd coeff: 0.72 L/kg Est AUC: 442  Plan: Vancomycin 1250mg  x1 then 1000mg  Q24 hr Cefepime 2g q12 Metronidazole per team Monitor cultures, clinical status, renal function, vancomycin level Narrow abx as able 7 day end date entered per consult   Height: 5\' 7"  (170.2 cm) Weight: 82.1 kg (181 lb) IBW/kg (Calculated) : 66.1  Temp (24hrs), Avg:99 F (37.2 C), Min:97.8 F (36.6 C), Max:100.9 F (38.3 C)  Recent Labs  Lab 08/24/22 1542  WBC 6.0  CREATININE 1.08    Estimated Creatinine Clearance: 44.8 mL/min (by C-G formula based on SCr of 1.08 mg/dL).    Allergies  Allergen Reactions   Codeine Swelling   Penicillins Swelling    Antimicrobials this admission: Vanc 5/7 >> 5/13 Cefe 5/7 >> 5/14 MTZ 5/7 >>  Dose adjustments this admission: none  Microbiology results: 5/7 BCx: pend   Thank you for allowing pharmacy to be a part of this patient's care.  Alphia Moh, PharmD, BCPS, BCCP Clinical Pharmacist  Please check AMION for all [facility] phone numbers After 10:00 PM, call Main Pharmacy [phone]

## 2022-08-24 NOTE — ED Notes (Signed)
Patient returned from CT treatment room.  Stretcher locked in lowest position, call bell in reach.  Pt resting comfortable.

## 2022-08-24 NOTE — ED Provider Notes (Signed)
[place] EMERGENCY DEPARTMENT AT [facility] Provider Note   CSN: [id number] Arrival date & time: 08/24/22  1401     History {Add pertinent medical, surgical, social history, OB history to HPI:1} Chief Complaint  Patient presents with   Weakness    Jesse Pollock Sr. is a 89 y.o. male.  89 year old male presents with his daughter with complaint of increasing lower extremity weakness and decreased ability to ambulate without assistance for about 4 weeks. History of prostate cancer, hypertension. No history of diabetes. Patient with ulcerated area on lateral aspect of right foot, and well as a developing sacral ulcer. Daughter reports patient is constantly sitting due to decreased ability to ambulate. He requires assistance with moving and ADL's. Daughter indicates patient with increasing shortness of breath with activity. Patient denies chest pain, abdominal pain, nausea, vomiting, diarrhea. Patient is HOH.   The history is provided by the patient and a relative.  Weakness Severity:  Moderate Onset quality:  Gradual Duration:  4 weeks Timing:  Constant Progression:  Worsening Chronicity:  New Associated symptoms: difficulty walking and shortness of breath   Associated symptoms: no abdominal pain, no chest pain, no loss of consciousness, no nausea, no near-syncope, no stroke symptoms, no syncope and no vomiting   Risk factors: family hx of stroke        Home Medications Prior to Admission medications   Medication Sig Start Date End Date Taking? Authorizing Provider  alendronate (FOSAMAX) 70 MG tablet TAKE 1 TABLET EVERY WEEK IN THE MORNING 30 MIN BEFORE EATING WITH AN 8OZ GLASS OF WATER (SIT UP 30 MIN) 07/12/22   Marcine Matar, MD  aspirin EC 81 MG tablet Take 81 mg by mouth daily.    [provider]  benazepril (LOTENSIN) 10 MG tablet Take 10 mg by mouth daily. 05/15/19   [provider]  bicalutamide (CASODEX) 50 MG tablet Take 1 tablet (50 mg  total) by mouth daily. 09/11/19   Marcine Matar, MD  fish oil-omega-3 fatty acids 1000 MG capsule Take 1 g by mouth daily.    [provider]  furosemide (LASIX) 20 MG tablet Take 20-40 mg by mouth daily as needed for fluid.  07/09/19   [provider]  hydrochlorothiazide (HYDRODIURIL) 25 MG tablet Take 25 mg by mouth daily.    [provider]  tamsulosin (FLOMAX) 0.4 MG CAPS capsule Take 0.4 mg by mouth daily after supper.    [provider]      Allergies    Codeine and Penicillins    Review of Systems   Review of Systems  Constitutional:  Positive for activity change.  Respiratory:  Positive for shortness of breath.   Cardiovascular:  Negative for chest pain, syncope and near-syncope.  Gastrointestinal:  Negative for abdominal pain, nausea and vomiting.  Skin:  Positive for wound. Negative for rash.  Neurological:  Positive for weakness. Negative for loss of consciousness.    Physical Exam Updated Vital Signs BP (!) 177/86   Pulse 64   Resp 20   Ht 5\' 7"  (1.702 m)   Wt 82.1 kg   SpO2 100%   BMI 28.35 kg/m  Physical Exam HENT:     Head: Normocephalic.     Nose: Nose normal.  Eyes:     Conjunctiva/sclera: Conjunctivae normal.  Cardiovascular:     Rate and Rhythm: Normal rate.  Pulmonary:     Effort: Pulmonary effort is normal.  Musculoskeletal:  General: No deformity.  Skin:    General: Skin is warm and dry.  Neurological:     Mental Status: He is alert and oriented to person, place, and time.     Motor: Weakness present.  Psychiatric:        Mood and Affect: Mood normal.        Behavior: Behavior normal.     ED Results / Procedures / Treatments   Labs (all labs ordered are listed, but only abnormal results are displayed) Labs Reviewed  COMPREHENSIVE METABOLIC PANEL - Abnormal; Notable for the following components:      Result Value   BUN 27 (*)    All other components within normal limits  CBC WITH  DIFFERENTIAL/PLATELET - Abnormal; Notable for the following components:   RBC 3.42 (*)    Hemoglobin 11.5 (*)    HCT 36.4 (*)    MCV 106.4 (*)    All other components within normal limits  URINALYSIS, ROUTINE W REFLEX MICROSCOPIC - Abnormal; Notable for the following components:   APPearance HAZY (*)    Hgb urine dipstick MODERATE (*)    Ketones, ur 5 (*)    Bacteria, UA RARE (*)    All other components within normal limits  BRAIN NATRIURETIC PEPTIDE - Abnormal; Notable for the following components:   B Natriuretic Peptide 160.0 (*)    All other components within normal limits  TROPONIN I (HIGH SENSITIVITY) - Abnormal; Notable for the following components:   Troponin I (High Sensitivity) 24 (*)    All other components within normal limits  TROPONIN I (HIGH SENSITIVITY) - Abnormal; Notable for the following components:   Troponin I (High Sensitivity) 25 (*)    All other components within normal limits    EKG None  Radiology DG Chest Port 1 View  Result Date: 08/24/2022 CLINICAL DATA:  Dyspnea on exertion, shortness of breath, and weakness for few days EXAM: PORTABLE CHEST 1 VIEW COMPARISON:  Portable exam 1527 hours compared to 07/24/2019 FINDINGS: Normal heart size, mediastinal contours, and pulmonary vascularity. Atherosclerotic calcification aorta. Lungs clear. No pulmonary infiltrate, pleural effusion, or pneumothorax. Bones demineralized. IMPRESSION: No acute abnormalities. Aortic Atherosclerosis (ICD10-I70.0). Electronically Signed   By: Ulyses Southward M.D.   On: 08/24/2022 15:35    Procedures Procedures  {Document cardiac monitor, telemetry assessment procedure when appropriate:1}  Medications Ordered in ED Medications - No data to display  ED Course/ Medical Decision Making/ A&P   {   Click here for ABCD2, HEART and other calculatorsREFRESH Note before signing :1}                          Medical Decision Making  ***  {Document critical care time when  appropriate:1} {Document review of labs and clinical decision tools ie heart score, Chads2Vasc2 etc:1}  {Document your independent review of radiology images, and any outside records:1} {Document your discussion with family members, caretakers, and with consultants:1} {Document social determinants of health affecting pt's care:1} {Document your decision making why or why not admission, treatments were needed:1} Final Clinical Impression(s) / ED Diagnoses Final diagnoses:  None    Rx / DC Orders ED Discharge Orders     None

## 2022-08-25 DIAGNOSIS — L97511 Non-pressure chronic ulcer of other part of right foot limited to breakdown of skin: Secondary | ICD-10-CM | POA: Diagnosis not present

## 2022-08-25 DIAGNOSIS — A499 Bacterial infection, unspecified: Secondary | ICD-10-CM | POA: Diagnosis not present

## 2022-08-25 DIAGNOSIS — L97514 Non-pressure chronic ulcer of other part of right foot with necrosis of bone: Secondary | ICD-10-CM | POA: Diagnosis not present

## 2022-08-25 DIAGNOSIS — I4891 Unspecified atrial fibrillation: Secondary | ICD-10-CM | POA: Insufficient documentation

## 2022-08-25 DIAGNOSIS — R001 Bradycardia, unspecified: Secondary | ICD-10-CM | POA: Diagnosis present

## 2022-08-25 DIAGNOSIS — E871 Hypo-osmolality and hyponatremia: Secondary | ICD-10-CM | POA: Diagnosis present

## 2022-08-25 DIAGNOSIS — K5641 Fecal impaction: Secondary | ICD-10-CM | POA: Diagnosis present

## 2022-08-25 DIAGNOSIS — R5381 Other malaise: Secondary | ICD-10-CM | POA: Diagnosis present

## 2022-08-25 DIAGNOSIS — R350 Frequency of micturition: Secondary | ICD-10-CM | POA: Diagnosis not present

## 2022-08-25 DIAGNOSIS — Z961 Presence of intraocular lens: Secondary | ICD-10-CM | POA: Diagnosis present

## 2022-08-25 DIAGNOSIS — L97909 Non-pressure chronic ulcer of unspecified part of unspecified lower leg with unspecified severity: Secondary | ICD-10-CM | POA: Diagnosis not present

## 2022-08-25 DIAGNOSIS — J90 Pleural effusion, not elsewhere classified: Secondary | ICD-10-CM | POA: Insufficient documentation

## 2022-08-25 DIAGNOSIS — I10 Essential (primary) hypertension: Secondary | ICD-10-CM | POA: Insufficient documentation

## 2022-08-25 DIAGNOSIS — I480 Paroxysmal atrial fibrillation: Secondary | ICD-10-CM | POA: Diagnosis not present

## 2022-08-25 DIAGNOSIS — N401 Enlarged prostate with lower urinary tract symptoms: Secondary | ICD-10-CM | POA: Diagnosis not present

## 2022-08-25 DIAGNOSIS — J40 Bronchitis, not specified as acute or chronic: Secondary | ICD-10-CM

## 2022-08-25 DIAGNOSIS — M869 Osteomyelitis, unspecified: Secondary | ICD-10-CM | POA: Diagnosis not present

## 2022-08-25 DIAGNOSIS — R7989 Other specified abnormal findings of blood chemistry: Secondary | ICD-10-CM | POA: Diagnosis not present

## 2022-08-25 DIAGNOSIS — E538 Deficiency of other specified B group vitamins: Secondary | ICD-10-CM | POA: Diagnosis present

## 2022-08-25 DIAGNOSIS — D519 Vitamin B12 deficiency anemia, unspecified: Secondary | ICD-10-CM | POA: Diagnosis present

## 2022-08-25 DIAGNOSIS — K5900 Constipation, unspecified: Secondary | ICD-10-CM | POA: Diagnosis not present

## 2022-08-25 DIAGNOSIS — N3000 Acute cystitis without hematuria: Secondary | ICD-10-CM | POA: Diagnosis present

## 2022-08-25 DIAGNOSIS — G4700 Insomnia, unspecified: Secondary | ICD-10-CM | POA: Diagnosis present

## 2022-08-25 DIAGNOSIS — E872 Acidosis, unspecified: Secondary | ICD-10-CM | POA: Diagnosis present

## 2022-08-25 DIAGNOSIS — I70235 Atherosclerosis of native arteries of right leg with ulceration of other part of foot: Secondary | ICD-10-CM | POA: Diagnosis not present

## 2022-08-25 DIAGNOSIS — E8779 Other fluid overload: Secondary | ICD-10-CM | POA: Diagnosis not present

## 2022-08-25 DIAGNOSIS — N400 Enlarged prostate without lower urinary tract symptoms: Secondary | ICD-10-CM | POA: Insufficient documentation

## 2022-08-25 DIAGNOSIS — I739 Peripheral vascular disease, unspecified: Secondary | ICD-10-CM | POA: Diagnosis present

## 2022-08-25 DIAGNOSIS — D539 Nutritional anemia, unspecified: Secondary | ICD-10-CM

## 2022-08-25 DIAGNOSIS — D638 Anemia in other chronic diseases classified elsewhere: Secondary | ICD-10-CM | POA: Diagnosis not present

## 2022-08-25 DIAGNOSIS — L97516 Non-pressure chronic ulcer of other part of right foot with bone involvement without evidence of necrosis: Secondary | ICD-10-CM | POA: Diagnosis present

## 2022-08-25 DIAGNOSIS — R509 Fever, unspecified: Secondary | ICD-10-CM

## 2022-08-25 DIAGNOSIS — R531 Weakness: Secondary | ICD-10-CM | POA: Diagnosis present

## 2022-08-25 DIAGNOSIS — Z1152 Encounter for screening for COVID-19: Secondary | ICD-10-CM | POA: Diagnosis not present

## 2022-08-25 DIAGNOSIS — L89152 Pressure ulcer of sacral region, stage 2: Secondary | ICD-10-CM | POA: Diagnosis present

## 2022-08-25 DIAGNOSIS — R600 Localized edema: Secondary | ICD-10-CM | POA: Diagnosis not present

## 2022-08-25 DIAGNOSIS — Z8546 Personal history of malignant neoplasm of prostate: Secondary | ICD-10-CM | POA: Diagnosis not present

## 2022-08-25 DIAGNOSIS — L97519 Non-pressure chronic ulcer of other part of right foot with unspecified severity: Secondary | ICD-10-CM | POA: Insufficient documentation

## 2022-08-25 DIAGNOSIS — D649 Anemia, unspecified: Secondary | ICD-10-CM | POA: Diagnosis not present

## 2022-08-25 DIAGNOSIS — C61 Malignant neoplasm of prostate: Secondary | ICD-10-CM | POA: Diagnosis not present

## 2022-08-25 DIAGNOSIS — D62 Acute posthemorrhagic anemia: Secondary | ICD-10-CM | POA: Diagnosis present

## 2022-08-25 DIAGNOSIS — Z87442 Personal history of urinary calculi: Secondary | ICD-10-CM | POA: Diagnosis not present

## 2022-08-25 DIAGNOSIS — R937 Abnormal findings on diagnostic imaging of other parts of musculoskeletal system: Secondary | ICD-10-CM | POA: Diagnosis present

## 2022-08-25 DIAGNOSIS — N179 Acute kidney failure, unspecified: Secondary | ICD-10-CM | POA: Diagnosis not present

## 2022-08-25 DIAGNOSIS — Z923 Personal history of irradiation: Secondary | ICD-10-CM | POA: Diagnosis not present

## 2022-08-25 DIAGNOSIS — M8618 Other acute osteomyelitis, other site: Secondary | ICD-10-CM | POA: Diagnosis not present

## 2022-08-25 DIAGNOSIS — G629 Polyneuropathy, unspecified: Secondary | ICD-10-CM | POA: Diagnosis present

## 2022-08-25 DIAGNOSIS — R7881 Bacteremia: Secondary | ICD-10-CM | POA: Diagnosis not present

## 2022-08-25 DIAGNOSIS — M86171 Other acute osteomyelitis, right ankle and foot: Secondary | ICD-10-CM | POA: Diagnosis not present

## 2022-08-25 DIAGNOSIS — Z9841 Cataract extraction status, right eye: Secondary | ICD-10-CM | POA: Diagnosis not present

## 2022-08-25 DIAGNOSIS — M7989 Other specified soft tissue disorders: Secondary | ICD-10-CM | POA: Diagnosis not present

## 2022-08-25 DIAGNOSIS — E8770 Fluid overload, unspecified: Secondary | ICD-10-CM | POA: Diagnosis not present

## 2022-08-25 DIAGNOSIS — K5901 Slow transit constipation: Secondary | ICD-10-CM | POA: Diagnosis not present

## 2022-08-25 DIAGNOSIS — L89312 Pressure ulcer of right buttock, stage 2: Secondary | ICD-10-CM | POA: Diagnosis present

## 2022-08-25 DIAGNOSIS — A4189 Other specified sepsis: Secondary | ICD-10-CM | POA: Diagnosis present

## 2022-08-25 DIAGNOSIS — M609 Myositis, unspecified: Secondary | ICD-10-CM | POA: Diagnosis present

## 2022-08-25 DIAGNOSIS — M8580 Other specified disorders of bone density and structure, unspecified site: Secondary | ICD-10-CM | POA: Diagnosis present

## 2022-08-25 DIAGNOSIS — I96 Gangrene, not elsewhere classified: Secondary | ICD-10-CM | POA: Diagnosis not present

## 2022-08-25 DIAGNOSIS — A419 Sepsis, unspecified organism: Secondary | ICD-10-CM | POA: Diagnosis present

## 2022-08-25 DIAGNOSIS — I4819 Other persistent atrial fibrillation: Secondary | ICD-10-CM | POA: Diagnosis present

## 2022-08-25 DIAGNOSIS — E46 Unspecified protein-calorie malnutrition: Secondary | ICD-10-CM | POA: Diagnosis present

## 2022-08-25 DIAGNOSIS — Z9842 Cataract extraction status, left eye: Secondary | ICD-10-CM | POA: Diagnosis not present

## 2022-08-25 DIAGNOSIS — R6520 Severe sepsis without septic shock: Secondary | ICD-10-CM | POA: Diagnosis present

## 2022-08-25 DIAGNOSIS — E875 Hyperkalemia: Secondary | ICD-10-CM | POA: Diagnosis not present

## 2022-08-25 DIAGNOSIS — E669 Obesity, unspecified: Secondary | ICD-10-CM | POA: Diagnosis present

## 2022-08-25 DIAGNOSIS — L89626 Pressure-induced deep tissue damage of left heel: Secondary | ICD-10-CM | POA: Diagnosis present

## 2022-08-25 DIAGNOSIS — Z96651 Presence of right artificial knee joint: Secondary | ICD-10-CM | POA: Diagnosis present

## 2022-08-25 DIAGNOSIS — Z7901 Long term (current) use of anticoagulants: Secondary | ICD-10-CM | POA: Diagnosis not present

## 2022-08-25 LAB — VITAMIN B12: Vitamin B-12: <50 pg/mL — ABNORMAL LOW (ref 180–914)

## 2022-08-25 LAB — CBC
HCT: 28 % — ABNORMAL LOW (ref 39.0–52.0)
Hemoglobin: 8.9 g/dL — ABNORMAL LOW (ref 13.0–17.0)
MCH: 34.1 pg — ABNORMAL HIGH (ref 26.0–34.0)
MCHC: 31.8 g/dL (ref 30.0–36.0)
MCV: 107.3 fL — ABNORMAL HIGH (ref 80.0–100.0)
Platelets: 242 10*3/uL (ref 150–400)
RBC: 2.61 MIL/uL — ABNORMAL LOW (ref 4.22–5.81)
RDW: 14.6 % (ref 11.5–15.5)
WBC: 19.8 10*3/uL — ABNORMAL HIGH (ref 4.0–10.5)
nRBC: 0 % (ref 0.0–0.2)

## 2022-08-25 LAB — COMPREHENSIVE METABOLIC PANEL
ALT: 11 U/L (ref 0–44)
AST: 21 U/L (ref 15–41)
Albumin: 2.7 g/dL — ABNORMAL LOW (ref 3.5–5.0)
Alkaline Phosphatase: 46 U/L (ref 38–126)
Anion gap: 9 (ref 5–15)
BUN: 24 mg/dL — ABNORMAL HIGH (ref 8–23)
CO2: 22 mmol/L (ref 22–32)
Calcium: 8.5 mg/dL — ABNORMAL LOW (ref 8.9–10.3)
Chloride: 105 mmol/L (ref 98–111)
Creatinine, Ser: 0.99 mg/dL (ref 0.61–1.24)
GFR, Estimated: >60 mL/min (ref >60)
Glucose, Bld: 86 mg/dL (ref 70–99)
Potassium: 3.6 mmol/L (ref 3.5–5.1)
Sodium: 136 mmol/L (ref 135–145)
Total Bilirubin: 1.2 mg/dL (ref 0.3–1.2)
Total Protein: 5.6 g/dL — ABNORMAL LOW (ref 6.5–8.1)

## 2022-08-25 LAB — MAGNESIUM: Magnesium: 1.9 mg/dL (ref 1.7–2.4)

## 2022-08-25 LAB — FOLATE: Folate: 7.1 ng/mL (ref >5.9)

## 2022-08-25 LAB — CULTURE, BLOOD (ROUTINE X 2)

## 2022-08-25 LAB — PHOSPHORUS: Phosphorus: 3.3 mg/dL (ref 2.5–4.6)

## 2022-08-25 LAB — LACTIC ACID, PLASMA: Lactic Acid, Venous: 2.3 mmol/L (ref 0.5–1.9)

## 2022-08-25 LAB — GLUCOSE, CAPILLARY: Glucose-Capillary: 86 mg/dL (ref 70–99)

## 2022-08-25 LAB — PROCALCITONIN: Procalcitonin: 12.53 ng/mL

## 2022-08-25 MED ORDER — ACETAMINOPHEN 650 MG RE SUPP: 650.0000 mg | Freq: Four times a day (QID) | RECTAL | Status: DC | PRN

## 2022-08-25 MED ORDER — CYANOCOBALAMIN 1000 MCG/ML IJ SOLN
1000.0000 ug | Freq: Every day | INTRAMUSCULAR | Status: DC
  Administered 2022-08-25 – 2022-09-10 (×16): 1000 ug via INTRAMUSCULAR
  Filled 2022-08-25 (×17): qty 1

## 2022-08-25 MED ORDER — METHYLPREDNISOLONE SODIUM SUCC 40 MG IJ SOLR
40.0000 mg | Freq: Two times a day (BID) | INTRAMUSCULAR | Status: DC
  Administered 2022-08-25: 40 mg via INTRAVENOUS
  Filled 2022-08-25: qty 1

## 2022-08-25 MED ORDER — TAMSULOSIN HCL 0.4 MG PO CAPS
0.4000 mg | ORAL_CAPSULE | Freq: Every day | ORAL | Status: DC
  Administered 2022-08-25 – 2022-09-10 (×17): 0.4 mg via ORAL
  Filled 2022-08-25 (×17): qty 1

## 2022-08-25 MED ORDER — ADULT MULTIVITAMIN W/MINERALS CH
1.0000 | ORAL_TABLET | Freq: Every day | ORAL | Status: DC
  Administered 2022-08-25 – 2022-09-10 (×15): 1 via ORAL
  Filled 2022-08-25 (×17): qty 1

## 2022-08-25 MED ORDER — FUROSEMIDE 10 MG/ML IJ SOLN
20.0000 mg | Freq: Every day | INTRAMUSCULAR | Status: DC
  Administered 2022-08-25 – 2022-09-10 (×16): 20 mg via INTRAVENOUS
  Filled 2022-08-25 (×16): qty 2

## 2022-08-25 MED ORDER — BENAZEPRIL HCL 5 MG PO TABS
10.0000 mg | ORAL_TABLET | Freq: Every day | ORAL | Status: DC
  Filled 2022-08-25: qty 2

## 2022-08-25 MED ORDER — ACETAMINOPHEN 325 MG PO TABS: 650.0000 mg | ORAL_TABLET | Freq: Four times a day (QID) | ORAL | Status: DC | PRN

## 2022-08-25 MED ORDER — ONDANSETRON HCL 4 MG/2ML IJ SOLN
4.0000 mg | Freq: Four times a day (QID) | INTRAMUSCULAR | Status: DC | PRN
  Administered 2022-08-27: 4 mg via INTRAVENOUS
  Filled 2022-08-25: qty 2

## 2022-08-25 MED ORDER — ENOXAPARIN SODIUM 40 MG/0.4ML IJ SOSY
40.0000 mg | PREFILLED_SYRINGE | INTRAMUSCULAR | Status: DC
  Administered 2022-08-25 – 2022-09-01 (×8): 40 mg via SUBCUTANEOUS
  Filled 2022-08-25 (×9): qty 0.4

## 2022-08-25 MED ORDER — ONDANSETRON HCL 4 MG PO TABS: 4.0000 mg | ORAL_TABLET | Freq: Four times a day (QID) | ORAL | Status: DC | PRN

## 2022-08-25 MED ORDER — MEDIHONEY WOUND/BURN DRESSING EX PSTE
1.0000 | PASTE | Freq: Every day | CUTANEOUS | Status: DC
  Administered 2022-08-25 – 2022-09-10 (×15): 1 via TOPICAL
  Filled 2022-08-25 (×3): qty 44

## 2022-08-25 MED ORDER — IPRATROPIUM-ALBUTEROL 0.5-2.5 (3) MG/3ML IN SOLN: 3.0000 mL | RESPIRATORY_TRACT | Status: DC | PRN

## 2022-08-25 MED ORDER — DM-GUAIFENESIN ER 30-600 MG PO TB12
1.0000 | ORAL_TABLET | Freq: Two times a day (BID) | ORAL | Status: DC
  Administered 2022-08-25 – 2022-09-10 (×30): 1 via ORAL
  Filled 2022-08-25 (×31): qty 1

## 2022-08-25 MED ORDER — METRONIDAZOLE 500 MG PO TABS
500.0000 mg | ORAL_TABLET | Freq: Two times a day (BID) | ORAL | Status: DC
  Administered 2022-08-25 – 2022-08-31 (×12): 500 mg via ORAL
  Filled 2022-08-25 (×12): qty 1

## 2022-08-25 MED ORDER — PANTOPRAZOLE SODIUM 40 MG PO TBEC
40.0000 mg | DELAYED_RELEASE_TABLET | Freq: Every day | ORAL | Status: DC
  Administered 2022-08-25 – 2022-09-10 (×16): 40 mg via ORAL
  Filled 2022-08-25 (×17): qty 1

## 2022-08-25 NOTE — Progress Notes (Signed)
[facility] PROGRESS NOTE  Jesse MESLER Sr. (DOB: 1928/11/25) ZOX:[id number] PCP: Elfredia Nevins, MD Outpatient Specialists: Urology, Dr. Retta Diones  Brief Narrative: Jesse Pollock Sr. is a 89 y.o. male with a history of prostate CA, BPH, HTN who presented to the ED on 08/24/2022 with progressive weakness. Found to be febrile with leukocytosis, started on antibiotics for sepsis with unclear source this morning.   Subjective: No pain in right foot, has had some pain on and off, wound hasn't healed, spontaneously began about 3-4 weeks ago. No dyspnea, no urinary symptoms. Very very unsteady and weak with PT. Has been staying in the chair per his daughter so much that he's getting a sore on his bottom.  Objective: BP (!) 148/62 (BP Location: Left Arm)   Pulse (!) 58   Temp 98.1 F (36.7 C) (Oral)   Resp 18   Ht 5\' 7"  (1.702 m)   Wt 82.1 kg   SpO2 100%   BMI 28.35 kg/m   Gen: Elderly frail male in no distress Pulm: Clear, nonlabored, 100% on room air  CV: Irreg bradycardia with mild BLE pitting edema. GI: Soft, NT, ND, +BS Neuro: Alert and oriented, very HOH. Diminished symmetric sensation to feet and ankles, EHL 5/5 bilaterally, plantarflexion 4/5 on right 5/5 on left (somewhat variable through exam ?effort dependent). Negative SLR. No clonus or focal deficits.  Ext: Warm, dry with diminished DP pulses bilaterally. R lateral foot with open ulceration with mild odor no exudate, no significant erythema. Skin: Left heel with intact DTPI posteriorly.   Assessment & Plan: Sepsis: Fever, abruptly rising leukocytosis (19.8k), PCT 12.53, lactic acidosis. Covid, flu, RSV PCRs negative. No pulmonary infiltrate - Check MRI R foot. No osteo by XR.  - Send urine culture based on pyuria, though has already received broad abx and no evidence of pyelonephritis/cystitis on CT.   Right foot ulcer: Neurogenic with limited sensation, also possibly arterial with diminished pulses.  - Continue broad  abx pending MRI. - ABI - Wound care per WOC.  Peripheral neuropathy: Vitamin B12 level is undetectable. Glucose levels normal, no hyperglycemia or history of diabetes.  - Supplement B12 daily IM for now.   Weakness: Progressive over 6 weeks or so. Suspect deconditioning. No tenderness to palpation of muscle groups, no back pain or focal weakness by exam.  - Continue PT, anticipate need for SNF level of rehabilitation. TOC consulted - Check CK.  - No current indication for neuroimaging  Prostate CA:  - Continue regular follow up with Dr. Retta Diones. Sclerotic focus on left 2nd rib noted on CTA chest.   Atrial fibrillation with bradycardic ventricular response: Uncertain chronicity. ECG repeated this AM confirms AFib.  - Continue telemetry. No pauses on personal review thus far, BP stable/elevated so no pharmacologic intervention currently required.  - Avoid AV nodal agents - Discussed anticoagulation for stroke ppx, will continue discussions Re: risks/benefits, give VTE ppx for now.  - No echo in system, has edema and elevated BNP. Hold lasix and further IVF for now.   No pulmonary symptoms currently of bronchitis: DC steroid (was given AFTER abrupt rise in WBC per MAR). No pulmonary infiltrate on my personal review.  Elevated BNP:  - Check echo. Stop IVF and lasix for now  Tyrone Nine, MD [facility] [email or web address] 08/25/2022, 4:55 PM

## 2022-08-25 NOTE — H&P (Addendum)
History and Physical    Patient: [name] FAO:[id number] DOB: [birth year] DOA: 08/24/2022 DOS: the patient was seen and examined on 08/25/2022 PCP: Elfredia Nevins, MD  Patient coming from: Home  Chief Complaint:  Chief Complaint  Patient presents with   Weakness   HPI: Jesse PINE Sr. is a 89 y.o. male with medical history significant of about hypertension, BPH, prostate cancer who presents to the Emergency Department from home via EMS due to generalized weakness.  Patient was unable to provide details regarding why she came to the emergency department, history was obtained from EDP and ED medical record.  Per report, patient has been having increasing lower extremity weakness and difficulty in ambulation without assistance for about 4 weeks.  Patient has been inactive by just sitting around during this period due to the weakness.  Patient has been having increasing shortness of breath with activity and tends to be more depending with ADLs.  However there was no report of, nausea, vomiting, abdominal pain.  ED Course:  In the emergency department, temperature was 98.3 F on arrival, however, he became febrile within 1 hour of being in the ED with a temperature of 100.9 F, BP was 178/79, other vital signs are within normal range.  Workup in the ED showed macrocytic anemia, BMP was normal except for BUN of 27.  Troponin x 2 -24 > 25, BNP 160, lactic acid 2.7 > 2.3.  Influenza A, B, SARS coronavirus 2, RSV was negative.  Blood culture pending CT angiography chest with contrast showed no pulmonary embolus, but showed small bilateral pleural effusions.  Mild bronchial wall thickening, can be seen with bronchitis or reactive airways disease CT abdomen and pelvis with contrast showed no acute abnormality in the abdomen/pelvis Left foot x-ray showed no acute fracture or dislocation Right foot x-ray showed no acute fracture or dislocation and ulceration of the skin lateral to the fifth MTP  joint. Chest x-ray showed no acute abnormalities Patient was treated with Tylenol, and was empirically started on IV metronidazole, vancomycin and cefepime.  IV hydration was provided.  Hospitalist was asked to admit patient for further evaluation and management.  Review of Systems: Review of systems as noted in the HPI. All other systems reviewed and are negative.   Past Medical History:  Diagnosis Date   Hypertension    Kidney calculus 2014;2013   Pneumonia    Prostate cancer [facility])    Shortness of breath    Past Surgical History:  Procedure Laterality Date   CATARACT EXTRACTION W/PHACO Right 02/13/2013   Procedure: CATARACT EXTRACTION RIGHT EYE (WITH PHACO) AND INTRAOCULAR LENS PLACEMENT  CDE=14.53;  Surgeon: Loraine Leriche T. Nile Riggs, MD;  Location: [facility];  Service: Ophthalmology;  Laterality: Right;   CATARACT EXTRACTION W/PHACO Left 02/27/2013   Procedure: CATARACT EXTRACTION PHACO AND INTRAOCULAR LENS PLACEMENT (IOC);  Surgeon: Loraine Leriche T. Nile Riggs, MD;  Location: [facility];  Service: Ophthalmology;  Laterality: Left;  CDE:12.54   COLONOSCOPY     JOINT REPLACEMENT Right 1993;1980   Right x2    Social History:  reports that he has never smoked. He has never used smokeless tobacco. He reports that he does not drink alcohol and does not use drugs.   Allergies  Allergen Reactions   Codeine Swelling   Penicillins Swelling    History reviewed. No pertinent family history.   Prior to Admission medications   Medication Sig Start Date End Date Taking? Authorizing Provider  alendronate (FOSAMAX) 70 MG tablet TAKE 1 TABLET  EVERY WEEK IN THE MORNING 30 MIN BEFORE EATING WITH AN 8OZ GLASS OF WATER (SIT UP 30 MIN) 07/12/22   Marcine Matar, MD  aspirin EC 81 MG tablet Take 81 mg by mouth daily.    [provider]  benazepril (LOTENSIN) 10 MG tablet Take 10 mg by mouth daily. 05/15/19   [provider]  bicalutamide (CASODEX) 50 MG tablet Take 1 tablet (50 mg total) by mouth  daily. 09/11/19   Marcine Matar, MD  fish oil-omega-3 fatty acids 1000 MG capsule Take 1 g by mouth daily.    [provider]  furosemide (LASIX) 20 MG tablet Take 20-40 mg by mouth daily as needed for fluid.  07/09/19   [provider]  hydrochlorothiazide (HYDRODIURIL) 25 MG tablet Take 25 mg by mouth daily.    [provider]  tamsulosin (FLOMAX) 0.4 MG CAPS capsule Take 0.4 mg by mouth daily after supper.    [provider]    Physical Exam: BP (!) 142/55 (BP Location: Left Arm)   Pulse 73   Temp 99.1 F (37.3 C) (Oral)   Resp (!) 21   Ht 5\' 7"  (1.702 m)   Wt 82.1 kg   SpO2 100%   BMI 28.35 kg/m   General: 89 y.o. year-old male well developed well nourished in no acute distress.  Alert and oriented x3. HEENT: NCAT, EOMI Neck: Supple, trachea medial Cardiovascular: Regular rate and rhythm with no rubs or gallops.  No thyromegaly or JVD noted.  No lower extremity edema. 2/4 pulses in all 4 extremities. Respiratory: Clear to auscultation with no wheezes or rales. Good inspiratory effort. Abdomen: Soft, nontender nondistended with normal bowel sounds x4 quadrants. Muskuloskeletal: Noted wound at  the fifth MTP joint.  No cyanosis, clubbing or edema noted bilaterally Neuro: CN II-XII intact, sensation, reflexes intact Skin: Skin warm and dry Psychiatry: Mood is appropriate for condition and setting           Labs on Admission:  Basic Metabolic Panel: Recent Labs  Lab 08/24/22 1542 08/24/22 2201  NA 136  --   K 3.9  --   CL 100  --   CO2 23  --   GLUCOSE 97  --   BUN 27*  --   CREATININE 1.08  --   CALCIUM 9.3  --   MG  --  2.1   Liver Function Tests: Recent Labs  Lab 08/24/22 1542  AST 22  ALT 13  ALKPHOS 64  BILITOT 1.2  PROT 7.5  ALBUMIN 3.7   No results for input(s): "LIPASE", "AMYLASE" in the last 168 hours. No results for input(s): "AMMONIA" in the last 168 hours. CBC: Recent Labs  Lab 08/24/22 1542  WBC 6.0   NEUTROABS 3.2  HGB 11.5*  HCT 36.4*  MCV 106.4*  PLT 289   Cardiac Enzymes: No results for input(s): "CKTOTAL", "CKMB", "CKMBINDEX", "TROPONINI" in the last 168 hours.  BNP (last 3 results) Recent Labs    08/24/22 1542  BNP 160.0*    ProBNP (last 3 results) No results for input(s): "PROBNP" in the last 8760 hours.  CBG: No results for input(s): "GLUCAP" in the last 168 hours.  Radiological Exams on Admission: CT Angio Chest PE W and/or Wo Contrast  Result Date: 08/24/2022 CLINICAL DATA:  Provided history: Pulmonary embolism (PE) suspected, high prob 89 year old with weakness. EXAM: CT ANGIOGRAPHY CHEST WITH CONTRAST TECHNIQUE: Multidetector CT imaging of the chest was performed using the standard protocol during bolus administration of  intravenous contrast. Multiplanar CT image reconstructions and MIPs were obtained to evaluate the vascular anatomy. Performed in conjunction with CT of the abdomen and pelvis. RADIATION DOSE REDUCTION: This exam was performed according to the departmental dose-optimization program which includes automated exposure control, adjustment of the mA and/or kV according to patient size and/or use of iterative reconstruction technique. CONTRAST:  OMNIPAQUE IOHEXOL 350 MG/ML SOLN COMPARISON:  Radiograph earlier today FINDINGS: Cardiovascular: There are no filling defects within the pulmonary arteries to suggest pulmonary embolus. Aortic atherosclerosis and tortuosity. The heart is upper normal in size. No pericardial effusion. There are coronary artery calcifications. Mediastinum/Nodes: No mediastinal or hilar adenopathy. Small hiatal hernia. Lungs/Pleura: Small bilateral pleural effusions. There is mild bronchial wall thickening. No focal airspace disease. No pulmonary nodule. Upper Abdomen: Assessed on concurrent abdominal CT, reported separately. Musculoskeletal: Exaggerated thoracic kyphosis. Diffuse anterior spurring. Sclerotic focus within the left second  rib series 4, image 9, nonspecific. There is mild fatty atrophy of paraspinal musculature. Minimal bilateral gynecomastia. Review of the MIP images confirms the above findings. IMPRESSION: 1. No pulmonary embolus. 2. Small bilateral pleural effusions. 3. Mild bronchial wall thickening, can be seen with bronchitis or reactive airways disease. 4. Aortic atherosclerosis.  Coronary artery calcifications. 5. Sclerotic focus in the left second rib is nonspecific given patient's history of prostate cancer. Aortic Atherosclerosis (ICD10-I70.0). Electronically Signed   By: Narda Rutherford M.D.   On: 08/24/2022 23:18   DG Foot 2 Views Right  Result Date: 08/24/2022 CLINICAL DATA:  Ulcer. EXAM: RIGHT FOOT - 2 VIEW COMPARISON:  None Available. FINDINGS: There is no acute fracture or dislocation. The bones are osteopenic. Old healed fracture of the distal fibula. There is ulceration of the skin lateral to the fifth MTP joint. There is diffuse subcutaneous edema. No radiopaque foreign object or soft tissue gas. IMPRESSION: 1. No acute fracture or dislocation. 2. Ulceration of the skin lateral to the fifth MTP joint. Electronically Signed   By: Elgie Collard M.D.   On: 08/24/2022 22:57   CT ABDOMEN PELVIS W CONTRAST  Result Date: 08/24/2022 CLINICAL DATA:  Provided history: Abdominal pain, acute, nonlocalized 89 year old with generalized weakness. EXAM: CT ABDOMEN AND PELVIS WITH CONTRAST TECHNIQUE: Multidetector CT imaging of the abdomen and pelvis was performed using the standard protocol following bolus administration of intravenous contrast. Performed in conjunction with CTA of the chest, reported separately. RADIATION DOSE REDUCTION: This exam was performed according to the departmental dose-optimization program which includes automated exposure control, adjustment of the mA and/or kV according to patient size and/or use of iterative reconstruction technique. CONTRAST:  OMNIPAQUE IOHEXOL 350 MG/ML SOLN  COMPARISON:  Abdominopelvic CT 07/05/2019 FINDINGS: Lower chest: Assessed on concurrent chest CT, reported separately. Hepatobiliary: Scattered tiny hepatic hypodensities are unchanged from prior exam and likely benign. No new hepatic abnormality. Gallbladder physiologically distended, no calcified stone. No biliary dilatation. Pancreas: No ductal dilatation or inflammation. No evidence of pancreatic mass. Spleen: Normal in size without focal abnormality. Adrenals/Urinary Tract: No adrenal nodule. No hydronephrosis. Homogeneous renal enhancement with symmetric excretion on delayed phase imaging. There are multiple bilateral renal cysts. These need no further imaging follow-up. Urinary bladder is moderately distended without wall thickening, although partially obscured by streak artifact from right hip arthroplasty. Stomach/Bowel: Mild stool distension of the rectum, rectal distention of 6.7 cm. There is otherwise a small volume of colonic stool. No bowel obstruction or inflammatory change. The appendix is normal. There is a small hiatal hernia. Vascular/Lymphatic: Aortic and branch atherosclerosis.  No aortic aneurysm. Portal vein is patent. No enlarged lymph nodes. Assessment for right pelvic adenopathy is limited due to streak artifact from right hip arthroplasty. Reproductive: Prostate gland is obscured by streak artifact from right hip arthroplasty. Other: No free air or ascites. Diminutive fat containing umbilical hernia. Musculoskeletal: There are multiple Schmorl's nodes throughout the lumbar spine, stable. Multilevel facet hypertrophy. Right hip arthroplasty. Advanced left hip degenerative change. No focal bone lesion. IMPRESSION: 1. No acute abnormality in the abdomen/pelvis. 2. Mild stool distension of the rectum, rectal distention of 6.7 cm, can be seen with fecal impaction. No bowel obstruction or inflammatory change. 3. Moderately distended urinary bladder without wall thickening. 4. Small hiatal hernia.  Aortic Atherosclerosis (ICD10-I70.0). Electronically Signed   By: Narda Rutherford M.D.   On: 08/24/2022 22:57   DG Foot 2 Views Left  Result Date: 08/24/2022 CLINICAL DATA:  Swelling of the foot. EXAM: LEFT FOOT - 2 VIEW COMPARISON:  None Available. FINDINGS: There is no acute fracture or dislocation. The bones are osteopenic. The soft tissues are grossly unremarkable. No radiopaque foreign object or soft tissue gas. IMPRESSION: 1. No acute fracture or dislocation. 2. Osteopenia. Electronically Signed   By: Elgie Collard M.D.   On: 08/24/2022 22:56   DG Chest Port 1 View  Result Date: 08/24/2022 CLINICAL DATA:  Dyspnea on exertion, shortness of breath, and weakness for few days EXAM: PORTABLE CHEST 1 VIEW COMPARISON:  Portable exam 1527 hours compared to 07/24/2019 FINDINGS: Normal heart size, mediastinal contours, and pulmonary vascularity. Atherosclerotic calcification aorta. Lungs clear. No pulmonary infiltrate, pleural effusion, or pneumothorax. Bones demineralized. IMPRESSION: No acute abnormalities. Aortic Atherosclerosis (ICD10-I70.0). Electronically Signed   By: Ulyses Southward M.D.   On: 08/24/2022 15:35    EKG: I independently viewed the EKG done and my findings are as followed: Atrial fibrillation with rate controlled  Assessment/Plan Present on Admission:  Fever of undetermined origin  Principal Problem:   Fever of undetermined origin Active Problems:   Lactic acidosis   Generalized weakness   Bronchitis   Bilateral pleural effusion   Right foot ulcer (HCC)   Essential hypertension   BPH (benign prostatic hyperplasia)   Paroxysmal atrial fibrillation (HCC)  Fever of undetermined origin No known cause for the fever at this time He was empirically started on metronidazole, vancomycin and cefepime, we shall continue with vancomycin and cefepime at this time Continue Tylenol as needed Blood culture pending Procalcitonin will be checked  Lactic acidosis possibly secondary to  above/multifactorial-improved Lactic acid 2.7 > 2.3 Continue IV hydration and continue to treat as described as above  Generalized weakness Continue fall precaution Continue PT/OT eval and treat  Possible bronchitis CT of chest was suggestive of possible bronchitis Continue duo nebs, Mucinex, Solu-Medrol Continue Protonix to prevent steroid-induced ulcer Continue incentive spirometry   Small bilateral pleural effusion Continue IV Lasix 20 mg daily  Right foot ulcer Nurse will be consulted  Macrocytic Anemia MCV 106.4 Patient complained of weakness with difficulty in ambulation, this may be related to vitamin B12 deficiency, folate and vitamin B12 levels will be checked  Essential hypertension Continue Benazepril  BPH Continue Flomax  Paroxysmal atrial fibrillation EKG personally reviewed showed atrial fibrillation with rate controlled No rate control or anticoagulant noted in patient's med rec    DVT prophylaxis: Lovenox  Advance Care Planning: Full code  Consults: None  Family Communication: None at bedside  Severity of Illness: The appropriate patient status for this patient is INPATIENT. Inpatient status is judged  to be reasonable and necessary in order to provide the required intensity of service to ensure the patient's safety. The patient's presenting symptoms, physical exam findings, and initial radiographic and laboratory data in the context of their chronic comorbidities is felt to place them at high risk for further clinical deterioration. Furthermore, it is not anticipated that the patient will be medically stable for discharge from the hospital within 2 midnights of admission.   * I certify that at the point of admission it is my clinical judgment that the patient will require inpatient hospital care spanning beyond 2 midnights from the point of admission due to high intensity of service, high risk for further deterioration and high frequency of surveillance  required.*  Author: Frankey Shown, DO 08/25/2022 4:42 AM  For on call review [email or web address].

## 2022-08-25 NOTE — Evaluation (Signed)
Occupational Therapy Evaluation Patient Details Name: Jesse SITES Sr. MRN: [id number] DOB: 1929/04/18 Today's Date: 08/25/2022   History of Present Illness Jesse CULLIGAN Sr. is a 89 y.o. male with medical history significant of about hypertension, BPH, prostate cancer who presents to the Emergency Department from home via EMS due to generalized weakness.  Patient was unable to provide details regarding why she came to the emergency department, history was obtained from EDP and ED medical record.  Per report, patient has been having increasing lower extremity weakness and difficulty in ambulation without assistance for about 4 weeks.  Patient has been inactive by just sitting around during this period due to the weakness.  Patient has been having increasing shortness of breath with activity and tends to be more depending with ADLs.  However there was no report of, nausea, vomiting, abdominal pain. (per DO)   Clinical Impression   Pt agreeable to OT and PT co-evaluation. Pt lethargic at first but arousal increased when pt sat at EOB. Bed mobility and transfer to chair completed with mod to max A. Pt was very unsteady in standing with RW. B UE generally weak. Pt was able to give history but no family present to confirm. Pt left in chair with chair alarm set. Pt will benefit from continued OT in the hospital and recommended venue below to increase strength, balance, and endurance for safe ADL's.         Recommendations for follow up therapy are one component of a multi-disciplinary discharge planning process, led by the attending physician.  Recommendations may be updated based on patient status, additional functional criteria and insurance authorization.   Assistance Recommended at Discharge Frequent or constant Supervision/Assistance  Patient can return home with the following A lot of help with walking and/or transfers;A lot of help with bathing/dressing/bathroom;Assistance with  cooking/housework;Assist for transportation;Help with stairs or ramp for entrance    Functional Status Assessment  Patient has had a recent decline in their functional status and demonstrates the ability to make significant improvements in function in a reasonable and predictable amount of time.  Equipment Recommendations  None recommended by OT    Recommendations for Other Services       Precautions / Restrictions Precautions Precautions: Fall Restrictions Weight Bearing Restrictions: No      Mobility Bed Mobility Overal bed mobility: Needs Assistance Bed Mobility: Supine to Sit     Supine to sit: Mod assist, Max assist, HOB elevated     General bed mobility comments: Labored effort. Assist needed to pull to sit and move B LE to EOB.    Transfers Overall transfer level: Needs assistance Equipment used: Rolling walker (2 wheels) Transfers: Sit to/from Stand, Bed to chair/wheelchair/BSC Sit to Stand: Mod assist, Max assist     Step pivot transfers: Mod assist, Max assist     General transfer comment: Assist to boost from EOB; very unsteady in standing with RW.      Balance Overall balance assessment: Needs assistance Sitting-balance support: No upper extremity supported, Feet supported Sitting balance-Leahy Scale: Good Sitting balance - Comments: seated at EOB   Standing balance support: Bilateral upper extremity supported, During functional activity, Reliant on assistive device for balance Standing balance-Leahy Scale: Poor Standing balance comment: using RW                           ADL either performed or assessed with clinical judgement   ADL Overall ADL's : Needs  assistance/impaired Eating/Feeding: Modified independent   Grooming: Minimal assistance;Sitting   Upper Body Bathing: Minimal assistance;Sitting;Min guard   Lower Body Bathing: Maximal assistance;Sitting/lateral leans   Upper Body Dressing : Minimal assistance;Min  guard;Sitting   Lower Body Dressing: Maximal assistance;Sitting/lateral leans   Toilet Transfer: Moderate assistance;Maximal assistance;Stand-pivot;Rolling walker (2 wheels) Toilet Transfer Details (indicate cue type and reason): Simulated via EOB to chair transfer. Toileting- Clothing Manipulation and Hygiene: Maximal assistance;Sitting/lateral lean;Bed level       Functional mobility during ADLs: Maximal assistance;Rolling walker (2 wheels)       Vision Baseline Vision/History: 1 Wears glasses Ability to See in Adequate Light: 1 Impaired Patient Visual Report: Other (comment) (Possible blurry vision difficult to fully understand what the pt was trying to say.) Vision Assessment?: No apparent visual deficits                Pertinent Vitals/Pain Pain Assessment Pain Assessment: No/denies pain     Hand Dominance Right   Extremity/Trunk Assessment Upper Extremity Assessment Upper Extremity Assessment: Generalized weakness   Lower Extremity Assessment Lower Extremity Assessment: Defer to PT evaluation   Cervical / Trunk Assessment Cervical / Trunk Assessment: Kyphotic   Communication Communication Communication: HOH   Cognition Arousal/Alertness: Lethargic Behavior During Therapy: WFL for tasks assessed/performed Overall Cognitive Status: No family/caregiver present to determine baseline cognitive functioning                                 General Comments: Oreiented to place, year was reported as 24 and 04.                      Home Living Family/patient expects to be discharged to:: Private residence Living Arrangements: Children Available Help at Discharge: Family;Available PRN/intermittently;Other (Comment) ("most of the time") Type of Home: House Home Access: Ramped entrance     Home Layout: Able to live on main level with bedroom/bathroom;Laundry or work area in basement     [employer] Shower/Tub: Contractor: Handicapped height Bathroom Accessibility: Yes How Accessible: Accessible via walker Home Equipment: Agricultural consultant (2 wheels);Cane - single point;BSC/3in1;Shower seat;Wheelchair - manual   Additional Comments: Information is per pt.      Prior Functioning/Environment Prior Level of Function : Needs assist       Physical Assist : ADLs (physical);Mobility (physical) Mobility (physical): Bed mobility;Transfers;Gait ADLs (physical): Bathing;Dressing;Toileting;IADLs;Grooming Mobility Comments: Daughter assists with bed mobility and transfers with RW. Pt seemed to report some use of w/c. ADLs Comments: Assist for bathing, dressing, toileting, and grooming. Able to feed self. Assist IADL's.        OT Problem List: Decreased strength;Decreased activity tolerance;Impaired balance (sitting and/or standing);Decreased cognition      OT Treatment/Interventions: Self-care/ADL training;Therapeutic exercise;DME and/or AE instruction;Patient/family education;Balance training;Therapeutic activities    OT Goals(Current goals can be found in the care plan section) Acute Rehab OT Goals Patient Stated Goal: Get stronger at rehab. OT Goal Formulation: With patient Time For Goal Achievement: 09/08/22 Potential to Achieve Goals: Good  OT Frequency: Min 2X/week    Co-evaluation PT/OT/SLP Co-Evaluation/Treatment: Yes Reason for Co-Treatment: To address functional/ADL transfers   OT goals addressed during session: ADL's and self-care                       End of Session Equipment Utilized During Treatment: Rolling walker (2 wheels);Gait belt  Activity Tolerance: Patient tolerated treatment  well Patient left: in chair;with call bell/phone within reach;with chair alarm set  OT Visit Diagnosis: Unsteadiness on feet (R26.81);Other abnormalities of gait and mobility (R26.89);Muscle weakness (generalized) (M62.81)                Time: 1610-9604 OT Time Calculation (min): 21  min Charges:  OT General Charges $OT Visit: 1 Visit OT Evaluation $OT Eval Low Complexity: 1 Low  Jasie Meleski OT, MOT  Danie Chandler 08/25/2022, 9:24 AM

## 2022-08-25 NOTE — Evaluation (Signed)
Physical Therapy Evaluation Patient Details Name: Jesse SADBERRY Sr. MRN: [id number] DOB: [birth year] Today's Date: 08/25/2022  History of Present Illness  Jesse RYALS Sr. is a 89 y.o. male with medical history significant of about hypertension, BPH, prostate cancer who presents to the Emergency Department from home via EMS due to generalized weakness.  Patient was unable to provide details regarding why she came to the emergency department, history was obtained from EDP and ED medical record.  Per report, patient has been having increasing lower extremity weakness and difficulty in ambulation without assistance for about 4 weeks.  Patient has been inactive by just sitting around during this period due to the weakness.  Patient has been having increasing shortness of breath with activity and tends to be more depending with ADLs.  However there was no report of, nausea, vomiting, abdominal pain.   Clinical Impression  Patient demonstrates slow labored movement for sitting up at bedside and became more alert once seated at bedside.  Patient very unsteady on feet with near fall during transfer to chair and unsafe for stepping away from bedside due to BLE weakness, fall risk and fatigue. Patient tolerated sitting up in chair after therapy.  Patient will benefit from continued skilled physical therapy in hospital and recommended venue below to increase strength, balance, endurance for safe ADLs and gait.          Recommendations for follow up therapy are one component of a multi-disciplinary discharge planning process, led by the attending physician.  Recommendations may be updated based on patient status, additional functional criteria and insurance authorization.  Follow Up Recommendations Can patient physically be transported by private vehicle: No     Assistance Recommended at Discharge    Patient can return home with the following  A lot of help with bathing/dressing/bathroom;A lot of help with  walking and/or transfers;Help with stairs or ramp for entrance;Assistance with cooking/housework    Equipment Recommendations None recommended by PT  Recommendations for Other Services       Functional Status Assessment Patient has had a recent decline in their functional status and demonstrates the ability to make significant improvements in function in a reasonable and predictable amount of time.     Precautions / Restrictions Precautions Precautions: Fall Restrictions Weight Bearing Restrictions: No      Mobility  Bed Mobility Overal bed mobility: Needs Assistance Bed Mobility: Supine to Sit     Supine to sit: Mod assist, Max assist, HOB elevated     General bed mobility comments: increased time, labored movement    Transfers Overall transfer level: Needs assistance Equipment used: Rolling walker (2 wheels) Transfers: Sit to/from Stand, Bed to chair/wheelchair/BSC Sit to Stand: Mod assist, Max assist   Step pivot transfers: Mod assist, Max assist       General transfer comment: unsteady labored movement    Ambulation/Gait Ambulation/Gait assistance: Max assist Gait Distance (Feet): 4 Feet Assistive device: Rolling walker (2 wheels) Gait Pattern/deviations: Decreased step length - right, Decreased step length - left, Decreased stride length, Shuffle, Trunk flexed Gait velocity: slow     General Gait Details: limited to a few slow unsteady labored side steps before having to sit due to loss of balance  Stairs            Wheelchair Mobility    Modified Rankin (Stroke Patients Only)       Balance Overall balance assessment: Needs assistance Sitting-balance support: Feet supported, No upper extremity supported Sitting balance-Leahy Scale: Good  Sitting balance - Comments: seated at EOB   Standing balance support: Bilateral upper extremity supported, During functional activity, Reliant on assistive device for balance Standing balance-Leahy Scale:  Poor Standing balance comment: using RW                             Pertinent Vitals/Pain Pain Assessment Pain Assessment: No/denies pain    Home Living Family/patient expects to be discharged to:: Private residence Living Arrangements: Children Available Help at Discharge: Family;Available PRN/intermittently;Other (Comment) Type of Home: House Home Access: Ramped entrance       Home Layout: Able to live on main level with bedroom/bathroom;Laundry or work area in [employer] Equipment: Agricultural consultant (2 wheels);Cane - single point;BSC/3in1;Shower seat;Wheelchair - manual Additional Comments: Information is per pt.    Prior Function Prior Level of Function : Needs assist       Physical Assist : ADLs (physical);Mobility (physical) Mobility (physical): Bed mobility;Transfers;Gait ADLs (physical): Bathing;Dressing;Toileting;IADLs;Grooming Mobility Comments: Supervised household ambulator using RW, uses wheelchair for longer distances ADLs Comments: Assist for bathing, dressing, toileting, and grooming. Able to feed self. Assist IADL's.     Hand Dominance   Dominant Hand: Right    Extremity/Trunk Assessment   Upper Extremity Assessment Upper Extremity Assessment: Defer to OT evaluation    Lower Extremity Assessment Lower Extremity Assessment: Generalized weakness    Cervical / Trunk Assessment Cervical / Trunk Assessment: Kyphotic  Communication   Communication: HOH  Cognition Arousal/Alertness: Awake/alert Behavior During Therapy: WFL for tasks assessed/performed Overall Cognitive Status: No family/caregiver present to determine baseline cognitive functioning                                          General Comments      Exercises     Assessment/Plan    PT Assessment Patient needs continued PT services  PT Problem List Decreased strength;Decreased activity tolerance;Decreased balance;Decreased mobility       PT Treatment  Interventions DME instruction;Gait training;Stair training;Functional mobility training;Therapeutic activities;Therapeutic exercise;Patient/family education;Balance training    PT Goals (Current goals can be found in the Care Plan section)  Acute Rehab PT Goals Patient Stated Goal: return home after rehab PT Goal Formulation: With patient Time For Goal Achievement: 09/08/22 Potential to Achieve Goals: Good    Frequency Min 3X/week     Co-evaluation PT/OT/SLP Co-Evaluation/Treatment: Yes Reason for Co-Treatment: To address functional/ADL transfers PT goals addressed during session: Mobility/safety with mobility;Balance;Proper use of DME         AM-PAC PT "6 Clicks" Mobility  Outcome Measure Help needed turning from your back to your side while in a flat bed without using bedrails?: A Lot Help needed moving from lying on your back to sitting on the side of a flat bed without using bedrails?: A Lot Help needed moving to and from a bed to a chair (including a wheelchair)?: A Lot Help needed standing up from a chair using your arms (e.g., wheelchair or bedside chair)?: A Lot Help needed to walk in hospital room?: A Lot Help needed climbing 3-5 steps with a railing? : Total 6 Click Score: 11    End of Session   Activity Tolerance: Patient tolerated treatment well;Patient limited by fatigue Patient left: in chair;with call bell/phone within reach;with chair alarm set Nurse Communication: Mobility status PT Visit Diagnosis: Unsteadiness on feet (R26.81);Other abnormalities of  gait and mobility (R26.89);Muscle weakness (generalized) (M62.81)    Time: 8295-6213 PT Time Calculation (min) (ACUTE ONLY): 29 min   Charges:   PT Evaluation $PT Eval Moderate Complexity: 1 Mod PT Treatments $Therapeutic Activity: 23-37 mins        2:25 PM, 08/25/22 Ocie Bob, MPT Physical Therapist with [facility] [phone] office [phone] mobile phone

## 2022-08-25 NOTE — TOC Initial Note (Signed)
Transition of Care (TOC) - Initial/Assessment Note    Patient Details  Name: Jesse SALZBERG Sr. MRN: [id number] Date of Birth: 1928/11/05  Transition of Care [facility]) CM/SW Contact:    Villa Herb, LCSWA Phone Number: 08/25/2022, 11:59 AM  Clinical Narrative:                 CSW updated that PT is recommending SNF for pt at D/C. CSW spoke with pts daughter who is in room with pt to review recommendation. Pts daughter states they are interested in SNF and prefer placement at [facility] if possible. CSW explained that referral can be sent to [facility] and TOC will follow if they are able to offer. TOC to follow.   Expected Discharge Plan: Skilled Nursing Facility Barriers to Discharge: Continued Medical Work up   Patient Goals and CMS Choice Patient states their goals for this hospitalization and ongoing recovery are:: go to SNF CMS Medicare.gov Compare Post Acute Care list provided to:: Patient Represenative (must comment) Choice offered to / list presented to : Adult Children [place] ownership interest in [facility].provided to:: Adult Children    Expected Discharge Plan and Services In-house Referral: Clinical Social Work Discharge Planning Services: CM Consult Post Acute Care Choice: Skilled Nursing Facility Living arrangements for the past 2 months: Single Family Home                                      Prior Living Arrangements/Services Living arrangements for the past 2 months: Single Family Home Lives with:: Self Patient language and need for interpreter reviewed:: Yes Do you feel safe going back to the place where you live?: Yes      Need for Family Participation in Patient Care: Yes (Comment) Care giver support system in place?: Yes (comment)   Criminal Activity/Legal Involvement Pertinent to Current Situation/Hospitalization: No - Comment as needed  Activities of Daily Living Home Assistive Devices/Equipment: Walker (specify type), Cane (specify quad or  straight) ADL Screening (condition at time of admission) Patient's cognitive ability adequate to safely complete daily activities?: No Is the patient deaf or have difficulty hearing?: Yes Does the patient have difficulty seeing, even when wearing glasses/contacts?: Yes Does the patient have difficulty concentrating, remembering, or making decisions?: Yes Patient able to express need for assistance with ADLs?: Yes Does the patient have difficulty dressing or bathing?: Yes Independently performs ADLs?: No Communication: Independent Dressing (OT): Needs assistance Is this a change from baseline?: Pre-admission baseline Grooming: Needs assistance Is this a change from baseline?: Pre-admission baseline Feeding: Independent Bathing: Needs assistance Is this a change from baseline?: Pre-admission baseline Toileting: Needs assistance Is this a change from baseline?: Pre-admission baseline In/Out Bed: Needs assistance Is this a change from baseline?: Pre-admission baseline Walks in Home: Needs assistance Is this a change from baseline?: Pre-admission baseline Does the patient have difficulty walking or climbing stairs?: Yes Weakness of Legs: Both Weakness of Arms/Hands: None  Permission Sought/Granted                  Emotional Assessment Appearance:: Appears stated age Attitude/Demeanor/Rapport: Engaged Affect (typically observed): Accepting   Alcohol / Substance Use: Not Applicable Psych Involvement: No (comment)  Admission diagnosis:  Fever, unknown origin [R50.9] Weakness [R53.1] Fever [R50.9] Patient Active Problem List   Diagnosis Date Noted   Fever of undetermined origin 08/25/2022   Lactic acidosis 08/25/2022   Generalized weakness  08/25/2022   Bronchitis 08/25/2022   Bilateral pleural effusion 08/25/2022   Right foot ulcer (HCC) 08/25/2022   Essential hypertension 08/25/2022   BPH (benign prostatic hyperplasia) 08/25/2022   Paroxysmal atrial fibrillation (HCC)  08/25/2022   PCP:  Elfredia Nevins, MD Pharmacy:   [facility] - [place], [place] - [phone] [place] [address] Phone: [phone] Fax: [phone]     Social Determinants of Health (SDOH) Social History: SDOH Screenings   Food Insecurity: No Food Insecurity (08/25/2022)  Housing: Low Risk  (08/25/2022)  Transportation Needs: No Transportation Needs (08/25/2022)  Utilities: Not At Risk (08/25/2022)  Alcohol Screen: Low Risk  (07/24/2019)  Depression (PHQ2-9): Low Risk  (07/24/2019)  Financial Resource Strain: Low Risk  (07/24/2019)  Physical Activity: Inactive (07/24/2019)  Social Connections: Moderately Integrated (07/24/2019)  Stress: No Stress Concern Present (07/24/2019)  Tobacco Use: Low Risk  (08/24/2022)   SDOH Interventions:     Readmission Risk Interventions     No data to display

## 2022-08-25 NOTE — Plan of Care (Signed)
  Problem: Acute Rehab OT Goals (only OT should resolve) Goal: Pt. Will Perform Grooming Flowsheets (Taken 08/25/2022 0925) Pt Will Perform Grooming:  with modified independence  sitting Goal: Pt. Will Perform Upper Body Dressing Flowsheets (Taken 08/25/2022 0925) Pt Will Perform Upper Body Dressing:  with supervision  sitting Goal: Pt. Will Perform Lower Body Dressing Flowsheets (Taken 08/25/2022 0925) Pt Will Perform Lower Body Dressing:  with mod assist  sitting/lateral leans  with adaptive equipment Goal: Pt. Will Transfer To Toilet Flowsheets (Taken 08/25/2022 [phone]) Pt Will Transfer to Toilet:  with min guard assist  squat pivot transfer Goal: Pt/Caregiver Will Perform Home Exercise Program Flowsheets (Taken 08/25/2022 [phone]) Pt/caregiver will Perform Home Exercise Program:  Increased strength  Both right and left upper extremity  Independently  Kingsley Farace OT, MOT

## 2022-08-25 NOTE — [place] FL2 (Signed)
[place] MEDICAID [id number] LEVEL OF CARE FORM     IDENTIFICATION  Patient Name: Jesse HYNEMAN Sr. Birthdate: 12-Dec-1928 Sex: male Admission Date (Current Location): 08/24/2022  [facility] and [place] Number:  [employer] and Address:  [facility],  [address], [place] [address]      Provider Number: [id number]  Attending Physician Name and Address:  Tyrone Nine, MD  Relative Name and Phone Number:       Current Level of Care: Hospital Recommended Level of Care: Skilled Nursing Facility Prior Approval Number:    Date Approved/Denied:   PASRR Number:    Discharge Plan: SNF    Current Diagnoses: Patient Active Problem List   Diagnosis Date Noted   Fever of undetermined origin 08/25/2022   Lactic acidosis 08/25/2022   Generalized weakness 08/25/2022   Bronchitis 08/25/2022   Bilateral pleural effusion 08/25/2022   Right foot ulcer (HCC) 08/25/2022   Essential hypertension 08/25/2022   BPH (benign prostatic hyperplasia) 08/25/2022   Paroxysmal atrial fibrillation (HCC) 08/25/2022    Orientation RESPIRATION BLADDER Height & Weight     Self, Situation, Time, Place  Normal Incontinent, External catheter Weight: 181 lb (82.1 kg) Height:  5\' 7"  (170.2 cm)  BEHAVIORAL SYMPTOMS/MOOD NEUROLOGICAL BOWEL NUTRITION STATUS      Continent Diet (Heart healthy)  AMBULATORY STATUS COMMUNICATION OF NEEDS Skin   Extensive Assist Verbally Normal, Other (Comment) (Left heel deep tissue pressure injury and buttocks right stage 2)                       Personal Care Assistance Level of Assistance  Bathing, Dressing, Feeding Bathing Assistance: Limited assistance Feeding assistance: Independent Dressing Assistance: Limited assistance     Functional Limitations Info  Sight, Hearing, Speech Sight Info: Adequate Hearing Info: Impaired Speech Info: Adequate    SPECIAL CARE FACTORS FREQUENCY  PT (By licensed PT), OT (By licensed OT)     PT Frequency: 5  times weekly OT Frequency: 5 times weekly            Contractures Contractures Info: Not present    Additional Factors Info  Code Status, Allergies Code Status Info: FULL Allergies Info: Codeine and penicillins           Current Medications (08/25/2022):  This is the current hospital active medication list Current Facility-Administered Medications  Medication Dose Route Frequency Provider Last Rate Last Admin   acetaminophen (TYLENOL) tablet 650 mg  650 mg Oral Q6H PRN Adefeso, Oladapo, DO       Or   acetaminophen (TYLENOL) suppository 650 mg  650 mg Rectal Q6H PRN Adefeso, Oladapo, DO       ceFEPIme (MAXIPIME) 2 g in sodium chloride 0.9 % 100 mL IVPB  2 g Intravenous Q12H Adefeso, Oladapo, DO 200 mL/hr at 08/25/22 1014 2 g at 08/25/22 1014   cyanocobalamin (VITAMIN B12) injection 1,000 mcg  1,000 mcg Intramuscular Daily Hazeline Junker B, MD   1,000 mcg at 08/25/22 1026   dextromethorphan-guaiFENesin (MUCINEX DM) 30-600 MG per 12 hr tablet 1 tablet  1 tablet Oral BID Adefeso, Oladapo, DO   1 tablet at 08/25/22 1004   enoxaparin (LOVENOX) injection 40 mg  40 mg Subcutaneous Q24H Adefeso, Oladapo, DO   40 mg at 08/25/22 1005   furosemide (LASIX) injection 20 mg  20 mg Intravenous Daily Adefeso, Oladapo, DO   20 mg at 08/25/22 1006   ipratropium-albuterol (DUONEB) 0.5-2.5 (3) MG/3ML nebulizer solution 3  mL  3 mL Nebulization Q4H PRN Adefeso, Oladapo, DO       lactated ringers infusion   Intravenous Continuous Adefeso, Oladapo, DO 150 mL/hr at 08/24/22 2316 New Bag at 08/24/22 2316   leptospermum manuka honey (MEDIHONEY) paste 1 Application  1 Application Topical Daily Tyrone Nine, MD   1 Application at 08/25/22 1027   ondansetron (ZOFRAN) tablet 4 mg  4 mg Oral Q6H PRN Adefeso, Oladapo, DO       Or   ondansetron (ZOFRAN) injection 4 mg  4 mg Intravenous Q6H PRN Adefeso, Oladapo, DO       pantoprazole (PROTONIX) EC tablet 40 mg  40 mg Oral Daily Adefeso, Oladapo, DO   40 mg at 08/25/22  1004   tamsulosin (FLOMAX) capsule 0.4 mg  0.4 mg Oral QPC supper Adefeso, Oladapo, DO       vancomycin (VANCOCIN) IVPB 1000 mg/200 mL premix  1,000 mg Intravenous Q24H Adefeso, Oladapo, DO         Discharge Medications: Please see discharge summary for a list of discharge medications.  Relevant Imaging Results:  Relevant Lab Results:   Additional Information SSN: [id number], [place]

## 2022-08-25 NOTE — Plan of Care (Signed)
  Problem: Acute Rehab PT Goals(only PT should resolve) Goal: Pt Will Go Supine/Side To Sit Outcome: Progressing Flowsheets (Taken 08/25/2022 1426) Pt will go Supine/Side to Sit: with minimal assist Goal: Patient Will Transfer Sit To/From Stand Outcome: Progressing Flowsheets (Taken 08/25/2022 1426) Patient will transfer sit to/from stand: with minimal assist Goal: Pt Will Transfer Bed To Chair/Chair To Bed Outcome: Progressing Flowsheets (Taken 08/25/2022 1426) Pt will Transfer Bed to Chair/Chair to Bed: with min assist Goal: Pt Will Ambulate Outcome: Progressing Flowsheets (Taken 08/25/2022 1426) Pt will Ambulate:  25 feet  with minimal assist  with moderate assist  with rolling walker   2:26 PM, 08/25/22 Ocie Bob, MPT Physical Therapist with [facility] [phone] office [phone] mobile phone

## 2022-08-25 NOTE — Consult Note (Signed)
WOC Nurse Consult Note: Reason for Consult: Consult requested for right foot.  Performed remotely after review of progress notes and photo in the EMR.  X-ray results indicate, "there is ulceration of the skin lateral to the fifth MTP joint. There is diffuse subcutaneous edema. No radiopaque foreign object or soft tissue gas." Wound type: Full thickness wound to outer foot, 100% eschar and callous edges Dressing procedure/placement/frequency: Topical treatment orders provided for bedside nurses to perform as follows to assist with removal of nonviable tissue: apply Medihoney to right foot wound Q day, then cover with foam dressing.  Change foam dressing Q 3 days or PRN soiling.  Please re-consult if further assistance is needed.  Thank-you,  Cammie Mcgee MSN, RN, CWOCN, [place], CNS [phone]

## 2022-08-25 NOTE — Progress Notes (Signed)
   08/25/22 0731  Vitals  Temp 98.5 F (36.9 C)  Temp Source Oral  BP (!) 136/55  MAP (mmHg) 76  BP Location Left Arm  BP Method Automatic  Patient Position (if appropriate) Lying  Pulse Rate (!) 44  Pulse Rate Source Apical  Level of Consciousness  Level of Consciousness Alert  MEWS COLOR  MEWS Score Color Yellow  Oxygen Therapy  SpO2 100 %  O2 Device Room Air  Pain Assessment  Pain Scale 0-10  Pain Score 0  MEWS Score  MEWS Temp 0  MEWS Systolic 0  MEWS Pulse 1  MEWS RR 1  MEWS LOC 0  MEWS Score 2   Received call from Central [employer] that pt's HR was in the 30's. In to room to find pt sleeping, skin warm and diaphoretic. Pt awakened to name called, startled. Tele reads HR 65/min SR with PVC's. When questioned, pt denies c/o, oriented to person. Very HOH. VSS, no temp. Chest CTA, diminished breath sounds bilaterally. Peripheral pulses (+) x4. IVF infusing without diff or s/s infiltration. CBG 86mg /dl. Pt back to sleep, HR back down into 35-40/min range. MD Jarvis Newcomer notified via AMION.

## 2022-08-25 NOTE — Progress Notes (Signed)
Spoke with MD Jarvis Newcomer via phone and updated on current pt condition. MD requests EKG to determine rhythm, EKG reads junctional bradycardia. EKG completed, MD Notified. Pt condition stable at this time, current b/p 101/46 (63), HR 42/min.

## 2022-08-26 ENCOUNTER — Inpatient Hospital Stay (HOSPITAL_COMMUNITY): Payer: Medicare Other

## 2022-08-26 DIAGNOSIS — R509 Fever, unspecified: Secondary | ICD-10-CM | POA: Diagnosis not present

## 2022-08-26 DIAGNOSIS — I480 Paroxysmal atrial fibrillation: Secondary | ICD-10-CM | POA: Diagnosis not present

## 2022-08-26 DIAGNOSIS — R531 Weakness: Secondary | ICD-10-CM | POA: Diagnosis not present

## 2022-08-26 DIAGNOSIS — I4891 Unspecified atrial fibrillation: Secondary | ICD-10-CM | POA: Diagnosis not present

## 2022-08-26 DIAGNOSIS — L97514 Non-pressure chronic ulcer of other part of right foot with necrosis of bone: Secondary | ICD-10-CM | POA: Diagnosis not present

## 2022-08-26 DIAGNOSIS — A499 Bacterial infection, unspecified: Secondary | ICD-10-CM

## 2022-08-26 LAB — CBC
HCT: 26.4 % — ABNORMAL LOW (ref 39.0–52.0)
Hemoglobin: 8.6 g/dL — ABNORMAL LOW (ref 13.0–17.0)
MCH: 34.3 pg — ABNORMAL HIGH (ref 26.0–34.0)
MCHC: 32.6 g/dL (ref 30.0–36.0)
MCV: 105.2 fL — ABNORMAL HIGH (ref 80.0–100.0)
Platelets: 232 10*3/uL (ref 150–400)
RBC: 2.51 MIL/uL — ABNORMAL LOW (ref 4.22–5.81)
RDW: 14.8 % (ref 11.5–15.5)
WBC: 14.7 10*3/uL — ABNORMAL HIGH (ref 4.0–10.5)
nRBC: 0 % (ref 0.0–0.2)

## 2022-08-26 LAB — BLOOD CULTURE ID PANEL (REFLEXED) - BCID2

## 2022-08-26 LAB — BASIC METABOLIC PANEL
Anion gap: 6 (ref 5–15)
BUN: 31 mg/dL — ABNORMAL HIGH (ref 8–23)
CO2: 22 mmol/L (ref 22–32)
Calcium: 8.1 mg/dL — ABNORMAL LOW (ref 8.9–10.3)
Chloride: 105 mmol/L (ref 98–111)
Creatinine, Ser: 1 mg/dL (ref 0.61–1.24)
GFR, Estimated: >60 mL/min (ref >60)
Glucose, Bld: 125 mg/dL — ABNORMAL HIGH (ref 70–99)
Potassium: 3.9 mmol/L (ref 3.5–5.1)
Sodium: 133 mmol/L — ABNORMAL LOW (ref 135–145)

## 2022-08-26 LAB — CULTURE, BLOOD (ROUTINE X 2): Special Requests: ADEQUATE

## 2022-08-26 LAB — ECHOCARDIOGRAM COMPLETE
Area-P 1/2: 3.99 cm2
Height: 67 in
Radius: 0.3 cm
S' Lateral: 2.9 cm
Weight: 2895.96 oz

## 2022-08-26 LAB — URINE CULTURE: Culture: >=100000 — AB

## 2022-08-26 LAB — CK: Total CK: 322 U/L (ref 49–397)

## 2022-08-26 LAB — PROCALCITONIN: Procalcitonin: 16.43 ng/mL

## 2022-08-26 LAB — C-REACTIVE PROTEIN: CRP: 12.1 mg/dL — ABNORMAL HIGH (ref <1.0)

## 2022-08-26 NOTE — Progress Notes (Signed)
Notified by lab that last anaerobic blood culture bottle came back positive for "gram + rods". Notified Dr. Jarvis Newcomer in person.

## 2022-08-26 NOTE — TOC Progression Note (Signed)
Transition of Care (TOC) - Progression Note    Patient Details  Name: Jesse SLATTEN Sr. MRN: [id number] Date of Birth: May 29, 1928  Transition of Care [facility]) CM/SW Contact  [place], [place] Phone Number: 08/26/2022, 10:43 AM  Clinical Narrative:    CSW spoke to [facility] with [facility]. CSW updated Lynnea Ferrier that pt will be transferring to [facility]. Lynnea Ferrier states she will leave pts referral pending at this time. TOC will follow up with Kerri closer to D/C. TOC to follow.   Expected Discharge Plan: Skilled Nursing Facility Barriers to Discharge: Continued Medical Work up  Expected Discharge Plan and Services In-house Referral: Clinical Social Work Discharge Planning Services: CM Consult Post Acute Care Choice: Skilled Nursing Facility Living arrangements for the past 2 months: Single Family Home                                       Social Determinants of Health (SDOH) Interventions SDOH Screenings   Food Insecurity: No Food Insecurity (08/25/2022)  Housing: Low Risk  (08/25/2022)  Transportation Needs: No Transportation Needs (08/25/2022)  Utilities: Not At Risk (08/25/2022)  Alcohol Screen: Low Risk  (07/24/2019)  Depression (PHQ2-9): Low Risk  (07/24/2019)  Financial Resource Strain: Low Risk  (07/24/2019)  Physical Activity: Inactive (07/24/2019)  Social Connections: Moderately Integrated (07/24/2019)  Stress: No Stress Concern Present (07/24/2019)  Tobacco Use: Low Risk  (08/24/2022)    Readmission Risk Interventions     No data to display

## 2022-08-26 NOTE — Progress Notes (Signed)
  Echocardiogram 2D Echocardiogram has been performed.  [name] 08/26/2022, 11:39 AM

## 2022-08-26 NOTE — Progress Notes (Signed)
[facility] PROGRESS NOTE  [name] Sr. (DOB: [birth year]) [id number] PCP: Elfredia Nevins, MD Outpatient Specialists: Urology, Dr. Retta Diones  Brief Narrative: Jesse Pollock Sr. is a 89 y.o. male with a history of prostate CA, BPH, HTN who presented to the ED on 08/24/2022 with progressive weakness. Found to be febrile with leukocytosis, started on antibiotics for sepsis with unclear source 5/8. Blood cultures have grown GPC and GPR for which broad IV antibiotics are continued. The patient has an ulcer on the right foot which may be the source of infection. X-ray is not conclusive, MRI is ordered. The patient will require transfer to [facility] for MRI and potentially orthopedic involvement if osteomyelitis is discovered.   Subjective: Patient eating breakfast this morning, has no new complaints. Daughter called twice, went to voicemail both times.  Objective: BP 137/61 (BP Location: Left Arm)   Pulse (!) 55   Temp 99 F (37.2 C)   Resp 18   Ht 5\' 7"  (1.702 m)   Wt 82.1 kg   SpO2 100%   BMI 28.35 kg/m   Gen: Elderly frail male in no distress Pulm: Clear, nonlabored, 100% on room air  CV: Irreg bradycardia with mild BLE pitting edema. GI: Soft, NT, ND, +BS Neuro: Alert, interactive, oriented, HOH. Stable exam. Diminished symmetric sensation to feet and ankles, EHL 5/5 bilaterally, plantarflexion 4/5 on right 5/5 on left (somewhat variable through exam ?effort dependent). Negative SLR. No clonus or focal deficits.  Ext: Warm, dry with diminished DP pulses bilaterally. R lateral foot with open ulceration with mild odor no exudate, no significant erythema. Skin: Left heel with intact flaccid blister posteriorly. All wounds covered with foam.   Assessment & Plan: Sepsis, gram positive bacteremia: Fever, abruptly rising leukocytosis (19.8k), PCT 12.53, lactic acidosis. Covid, flu, RSV PCRs negative. No pulmonary infiltrate. WBC clumps on UA, though no urinary symptoms.  - Check  MRI R foot. No osteo by XR, though this is increasingly likely the source of sepsis.  - Continue vancomycin, cefepime, flagyl. Await blood culture speciation (GPC in 3 of 4 bottles and GPR in 1 of 4 bottles thus far).  - Sent urine culture based on pyuria, though has already received broad abx and no evidence of pyelonephritis/cystitis on CT. Pt continues to have no respiratory symptoms.  - Echo pending as below - Repeat blood cultures, check CRP - ID consulted, Dr. Elinor Parkinson aware of patient and transfer.  Right foot ulcer: Neurogenic with limited sensation, also possibly arterial with diminished pulses.  - Continue broad abx pending MRI. May need orthopedics consultation depending on results.  - ABI's ordered. - Wound care per WOC.  Peripheral neuropathy: Vitamin B12 level is undetectable. Glucose levels normal, no hyperglycemia or history of diabetes.  - Supplement B12 daily IM for now.   Weakness: Progressive over 6 weeks or so. Suspect deconditioning. No tenderness to palpation of muscle groups, no back pain or focal weakness by exam. CK wnl. - Continue PT, anticipate need for SNF level of rehabilitation. TOC consulted  - No current indication for neuroimaging  Prostate CA:  - Continue regular follow up with Dr. Retta Diones. Sclerotic focus on left 2nd rib noted on CTA chest.   Atrial fibrillation with bradycardic ventricular response: Uncertain chronicity.  - Continue telemetry. No pauses on personal review thus far, BP stable/elevated so no pharmacologic intervention currently required.  - Avoid AV nodal agents - Discussed anticoagulation for stroke ppx with pt/daughter. We will continue discussions Re: risks/benefits, but give  VTE ppx for now.  - No echo in system, has edema and elevated BNP. TTE ordered and pending. Defer if TEE is required to ID.  No pulmonary symptoms currently of bronchitis: DC steroid (was given AFTER abrupt rise in WBC per MAR). No pulmonary infiltrate on my  personal review.  Elevated BNP:  - Check echo. Stop IVF and lasix for now  Jesse Nine, MD [facility] [email or web address] 08/26/2022, 10:51 AM

## 2022-08-26 NOTE — Progress Notes (Signed)
[facility] here to pick up patient for transport to [id number]. Report called to [facility], Charity fundraiser. All belongings sent with family at bedside.

## 2022-08-26 NOTE — Consult Note (Signed)
[facility]    Date of Admission:  08/24/2022     Total days of antibiotics 3               Reason for Consult: Bacteremia    Referring Provider: Dr. Hazeline Junker  Primary Care Provider: Elfredia Nevins, MD   ASSESSMENT:  Mr. Camarena is a 89 y/o male presenting to the ED with progressive weakness and decreased ability to ambulate and found to have gram positive bacteremia with gram positive cocci and gram positive rods and organism identification pending. Source of the infection possibly the right foot. X-rays were negative and agree with obtaining MRI with concern for osteomyelitis. No other clear source of infection present. TTE negative for vegetations. Narrow antibiotics to vancomycin and metronidazole. Monitor blood cultures for organism identification. Wound care per Wound RN recommendations with remaining medical and supportive care per Internal Medicine.   PLAN:  Discontinue cefepime. Continue vancomycin and metronidazole. Monitor blood cultures for initial organism(s) and repeat cultures for clearance of bacteremia. Therapeutic drug monitoring of renal function and vancomycin levels.  Wound care per Wound RN recommendations. Remaining medical and supportive care per Internal Medicine.    Principal Problem:   Fever of undetermined origin Active Problems:   Lactic acidosis   Generalized weakness   Bronchitis   Bilateral pleural effusion   Right foot ulcer (HCC)   Essential hypertension   BPH (benign prostatic hyperplasia)   Paroxysmal atrial fibrillation (HCC)    cyanocobalamin  1,000 mcg Intramuscular Daily   dextromethorphan-guaiFENesin  1 tablet Oral BID   enoxaparin (LOVENOX) injection  40 mg Subcutaneous Q24H   furosemide  20 mg Intravenous Daily   leptospermum manuka honey  1 Application Topical Daily   metroNIDAZOLE  500 mg Oral Q12H   multivitamin with minerals  1 tablet Oral Daily   pantoprazole  40 mg Oral Daily   tamsulosin   0.4 mg Oral QPC supper     HPI: Jeffie Pollock Sr. is a 89 y.o. male with previous medical history of prostate cancer and hypertension presenting to the ED from home via EMS with generalized weakness.  Mr. Grahn presented to the ED with about 4 week history or increasing lower extremity weakness and mobility. Foot ulcer on the lateral right foot and developing sacral ulcer as secondary to decreased ability to ambulate and increased sitting. Chest x-ray and CT angio chest, and CT abdomen/pelvis  with no significant findings. X-rays of the right foot with osteopenia and no acute fracture. Initially febrile with max temperature of 100.9 F with leukocytosis of 19.8. Started on vancomycin, cefepime and metronidazole for broad spectrum coverage. MRI of the right foot is pending. Blood cultures are growing gram positive cocci and gram positive rods that were not identified by the BCID. Secondary blood cultures drawn earlier today and are pending. Covid, influenza and RSV PCRs are all negative. Has been afebrile for the past 24 hours. No recent antibiotics.   Review of Systems: Review of Systems  Constitutional:  Negative for chills, fever and weight loss.  Respiratory:  Negative for cough, shortness of breath and wheezing.   Cardiovascular:  Negative for chest pain and leg swelling.  Gastrointestinal:  Negative for abdominal pain, constipation, diarrhea, nausea and vomiting.  Skin:  Negative for rash.     Past Medical History:  Diagnosis Date   Hypertension    Kidney calculus 2014;2013   Pneumonia    Prostate cancer (HCC)  Shortness of breath     Social History   Tobacco Use   Smoking status: Never   Smokeless tobacco: Never  Vaping Use   Vaping Use: Never used  Substance Use Topics   Alcohol use: No   Drug use: No    History reviewed. No pertinent family history.  Allergies  Allergen Reactions   Codeine Swelling   Penicillins Swelling    OBJECTIVE: Blood pressure 137/61,  pulse (!) 55, temperature 99 F (37.2 C), resp. rate 18, height 5\' 7"  (1.702 m), weight 82.1 kg, SpO2 100 %.  Physical Exam Constitutional:      General: He is not in acute distress.    Appearance: He is well-developed.  Cardiovascular:     Rate and Rhythm: Normal rate and regular rhythm.     Heart sounds: Normal heart sounds.  Pulmonary:     Effort: Pulmonary effort is normal.     Breath sounds: Normal breath sounds.  Musculoskeletal:     Comments: Wound right lateral foot.   Skin:    General: Skin is warm and dry.  Neurological:     Mental Status: He is alert.      Right lateral foot Lab Results Lab Results  Component Value Date   WBC 14.7 (H) 08/26/2022   HGB 8.6 (L) 08/26/2022   HCT 26.4 (L) 08/26/2022   MCV 105.2 (H) 08/26/2022   PLT 232 08/26/2022    Lab Results  Component Value Date   CREATININE 1.00 08/26/2022   BUN 31 (H) 08/26/2022   NA 133 (L) 08/26/2022   K 3.9 08/26/2022   CL 105 08/26/2022   CO2 22 08/26/2022    Lab Results  Component Value Date   ALT 11 08/25/2022   AST 21 08/25/2022   ALKPHOS 46 08/25/2022   BILITOT 1.2 08/25/2022     Microbiology: Recent Results (from the past 240 hour(s))  Resp panel by RT-PCR (RSV, Flu A&B, Covid) Anterior Nasal Swab     Status: None   Collection Time: 08/24/22  9:55 PM   Specimen: Anterior Nasal Swab  Result Value Ref Range Status   SARS Coronavirus 2 by RT PCR NEGATIVE NEGATIVE Final    Comment: (NOTE) SARS-CoV-2 target nucleic acids are NOT DETECTED.  The SARS-CoV-2 RNA is generally detectable in upper respiratory specimens during the acute phase of infection. The lowest concentration of SARS-CoV-2 viral copies this assay can detect is 138 copies/mL. A negative result does not preclude SARS-Cov-2 infection and should not be used as the sole basis for treatment or other patient management decisions. A negative result may occur with  improper specimen collection/handling, submission of specimen  other than nasopharyngeal swab, presence of viral mutation(s) within the areas targeted by this assay, and inadequate number of viral copies(<138 copies/mL). A negative result must be combined with clinical observations, patient history, and epidemiological information. The expected result is Negative.  Fact Sheet for Patients:  BloggerCourse.com  Fact Sheet for Healthcare Providers:  SeriousBroker.it  This test is no t yet approved or cleared by the [place] FDA and  has been authorized for detection and/or diagnosis of SARS-CoV-2 by FDA under an Emergency Use Authorization (EUA). This EUA will remain  in effect (meaning this test can be used) for the duration of the COVID-19 declaration under Section 564(b)(1) of the Act, 21 U.S.C.section 360bbb-3(b)(1), unless the authorization is terminated  or revoked sooner.       Influenza A by PCR NEGATIVE NEGATIVE Final   Influenza B  by PCR NEGATIVE NEGATIVE Final    Comment: (NOTE) The Xpert Xpress SARS-CoV-2/FLU/RSV plus assay is intended as an aid in the diagnosis of influenza from Nasopharyngeal swab specimens and should not be used as a sole basis for treatment. Nasal washings and aspirates are unacceptable for Xpert Xpress SARS-CoV-2/FLU/RSV testing.  Fact Sheet for Patients: BloggerCourse.com  Fact Sheet for Healthcare Providers: SeriousBroker.it  This test is not yet approved or cleared by the [place] FDA and has been authorized for detection and/or diagnosis of SARS-CoV-2 by FDA under an Emergency Use Authorization (EUA). This EUA will remain in effect (meaning this test can be used) for the duration of the COVID-19 declaration under Section 564(b)(1) of the Act, 21 U.S.C. section 360bbb-3(b)(1), unless the authorization is terminated or revoked.     Resp Syncytial Virus by PCR NEGATIVE NEGATIVE Final     Comment: (NOTE) Fact Sheet for Patients: BloggerCourse.com  Fact Sheet for Healthcare Providers: SeriousBroker.it  This test is not yet approved or cleared by the [place] FDA and has been authorized for detection and/or diagnosis of SARS-CoV-2 by FDA under an Emergency Use Authorization (EUA). This EUA will remain in effect (meaning this test can be used) for the duration of the COVID-19 declaration under Section 564(b)(1) of the Act, 21 U.S.C. section 360bbb-3(b)(1), unless the authorization is terminated or revoked.  Performed at [facility], [address] [place], [place] [address]   Blood Culture (routine x 2)     Status: None (Preliminary result)   Collection Time: 08/24/22 10:01 PM   Specimen: BLOOD  Result Value Ref Range Status   Specimen Description   Final    BLOOD BLOOD LEFT HAND Performed at [facility], [address], [place], [place] [address]    Special Requests   Final    BOTTLES DRAWN AEROBIC AND ANAEROBIC Blood Culture adequate volume Performed at [facility], [address] [place], [place] [address]    Culture  Setup Time   Final    GRAM POSITIVE COCCI IN BOTH AEROBIC AND ANAEROBIC BOTTLES Gram Stain Report Called to,Read Back By and Verified With: PIPKIN,C ON 08/25/22 AT 2110 BY LOY,C CRITICAL RESULT CALLED TO, READ BACK BY AND VERIFIED WITH: C PIPKINS,RN@[email or web address]  11/26/22 [facility] Performed at [facility], [address], [place], [place] [address]    Culture   Final    GRAM POSITIVE COCCI IDENTIFICATION TO FOLLOW Performed at [facility], [address] [place], [place] [address]    Report Status PENDING  Incomplete  Blood Culture ID Panel (Reflexed)     Status: None   Collection Time: 08/24/22 10:01 PM  Result Value Ref Range Status   Enterococcus faecalis NOT DETECTED NOT DETECTED Final   Enterococcus Faecium NOT DETECTED NOT DETECTED Final   Listeria monocytogenes NOT DETECTED NOT DETECTED  Final   Staphylococcus species NOT DETECTED NOT DETECTED Final   Staphylococcus aureus (BCID) NOT DETECTED NOT DETECTED Final   Staphylococcus epidermidis NOT DETECTED NOT DETECTED Final   Staphylococcus lugdunensis NOT DETECTED NOT DETECTED Final   Streptococcus species NOT DETECTED NOT DETECTED Final   Streptococcus agalactiae NOT DETECTED NOT DETECTED Final   Streptococcus pneumoniae NOT DETECTED NOT DETECTED Final   Streptococcus pyogenes NOT DETECTED NOT DETECTED Final   A.calcoaceticus-baumannii NOT DETECTED NOT DETECTED Final   Bacteroides fragilis NOT DETECTED NOT DETECTED Final   Enterobacterales NOT DETECTED NOT DETECTED Final   Enterobacter cloacae complex NOT DETECTED NOT DETECTED Final   Escherichia coli NOT DETECTED NOT  DETECTED Final   Klebsiella aerogenes NOT DETECTED NOT DETECTED Final   Klebsiella oxytoca NOT DETECTED NOT DETECTED Final   Klebsiella pneumoniae NOT DETECTED NOT DETECTED Final   Proteus species NOT DETECTED NOT DETECTED Final   Salmonella species NOT DETECTED NOT DETECTED Final   Serratia marcescens NOT DETECTED NOT DETECTED Final   Haemophilus influenzae NOT DETECTED NOT DETECTED Final   Neisseria meningitidis NOT DETECTED NOT DETECTED Final   Pseudomonas aeruginosa NOT DETECTED NOT DETECTED Final   Stenotrophomonas maltophilia NOT DETECTED NOT DETECTED Final   Candida albicans NOT DETECTED NOT DETECTED Final   Candida auris NOT DETECTED NOT DETECTED Final   Candida glabrata NOT DETECTED NOT DETECTED Final   Candida krusei NOT DETECTED NOT DETECTED Final   Candida parapsilosis NOT DETECTED NOT DETECTED Final   Candida tropicalis NOT DETECTED NOT DETECTED Final   Cryptococcus neoformans/gattii NOT DETECTED NOT DETECTED Final    Comment: Performed at [facility], [address] [place], [place] [address]  Blood Culture (routine x 2)     Status: None (Preliminary result)   Collection Time: 08/24/22 10:02 PM   Specimen: BLOOD  Result Value Ref  Range Status   Specimen Description   Final    BLOOD BLOOD LEFT WRIST Performed at [facility], [address], [place], [place] [address]    Special Requests   Final    BOTTLES DRAWN AEROBIC AND ANAEROBIC Blood Culture adequate volume Performed at [facility], [address], [place], [place] [address]    Culture  Setup Time   Final    GRAM POSITIVE COCCI AEROBIC BOTTLE ONLY CRITICAL VALUE NOTED.  VALUE IS CONSISTENT WITH PREVIOUSLY REPORTED AND CALLED VALUE. GRAM POSITIVE RODS ANAEROBIC BOTTLE ONLY Gram Stain Report Called to,Read Back By and Verified With: MADISON,GINGER @ [phone] ON 08/26/2022 BY FRATTO,ASHLEY Performed at [facility], [address], [place], [place] [address]    Culture [facility] POSITIVE COCCI  Final   Report Status PENDING  Incomplete  Culture, blood (Routine X 2) w Reflex to ID Panel     Status: None (Preliminary result)   Collection Time: 08/26/22 11:50 AM   Specimen: BLOOD RIGHT HAND  Result Value Ref Range Status   Specimen Description BLOOD RIGHT HAND  Final   Special Requests   Final    BOTTLES DRAWN AEROBIC AND ANAEROBIC Blood Culture results may not be optimal due to an excessive volume of blood received in culture bottles Performed at [facility], [address], [place], [place] [address]    Culture PENDING  Incomplete   Report Status PENDING  Incomplete  Culture, blood (Routine X 2) w Reflex to ID Panel     Status: None (Preliminary result)   Collection Time: 08/26/22 11:50 AM   Specimen: Right Antecubital; Blood  Result Value Ref Range Status   Specimen Description RIGHT ANTECUBITAL  Final   Special Requests   Final    BOTTLES DRAWN AEROBIC AND ANAEROBIC Blood Culture adequate volume Performed at [facility], [address] [place], [place] [address]    Culture PENDING  Incomplete   Report Status PENDING  Incomplete     Marcos Eke, NP [facility]  08/26/2022  2:11 PM

## 2022-08-26 NOTE — Progress Notes (Signed)
Patient could not hold still for exam.  Even after several attempts, patient did not understand significance of holding still.

## 2022-08-27 ENCOUNTER — Inpatient Hospital Stay (HOSPITAL_COMMUNITY): Payer: Medicare Other

## 2022-08-27 DIAGNOSIS — L97514 Non-pressure chronic ulcer of other part of right foot with necrosis of bone: Secondary | ICD-10-CM | POA: Diagnosis not present

## 2022-08-27 DIAGNOSIS — A499 Bacterial infection, unspecified: Secondary | ICD-10-CM | POA: Diagnosis not present

## 2022-08-27 DIAGNOSIS — R350 Frequency of micturition: Secondary | ICD-10-CM

## 2022-08-27 DIAGNOSIS — N401 Enlarged prostate with lower urinary tract symptoms: Secondary | ICD-10-CM

## 2022-08-27 DIAGNOSIS — I480 Paroxysmal atrial fibrillation: Secondary | ICD-10-CM | POA: Diagnosis not present

## 2022-08-27 DIAGNOSIS — J90 Pleural effusion, not elsewhere classified: Secondary | ICD-10-CM | POA: Diagnosis not present

## 2022-08-27 DIAGNOSIS — J40 Bronchitis, not specified as acute or chronic: Secondary | ICD-10-CM | POA: Diagnosis not present

## 2022-08-27 LAB — COMPREHENSIVE METABOLIC PANEL
ALT: 18 U/L (ref 0–44)
AST: 33 U/L (ref 15–41)
Albumin: 2.6 g/dL — ABNORMAL LOW (ref 3.5–5.0)
Alkaline Phosphatase: 46 U/L (ref 38–126)
Anion gap: 11 (ref 5–15)
BUN: 27 mg/dL — ABNORMAL HIGH (ref 8–23)
CO2: 21 mmol/L — ABNORMAL LOW (ref 22–32)
Calcium: 8.6 mg/dL — ABNORMAL LOW (ref 8.9–10.3)
Chloride: 103 mmol/L (ref 98–111)
Creatinine, Ser: 1.09 mg/dL (ref 0.61–1.24)
GFR, Estimated: >60 mL/min (ref >60)
Glucose, Bld: 103 mg/dL — ABNORMAL HIGH (ref 70–99)
Potassium: 3.6 mmol/L (ref 3.5–5.1)
Sodium: 135 mmol/L (ref 135–145)
Total Bilirubin: 0.8 mg/dL (ref 0.3–1.2)
Total Protein: 5.9 g/dL — ABNORMAL LOW (ref 6.5–8.1)

## 2022-08-27 LAB — CBC
HCT: 29.2 % — ABNORMAL LOW (ref 39.0–52.0)
Hemoglobin: 9.5 g/dL — ABNORMAL LOW (ref 13.0–17.0)
MCH: 33.6 pg (ref 26.0–34.0)
MCHC: 32.5 g/dL (ref 30.0–36.0)
MCV: 103.2 fL — ABNORMAL HIGH (ref 80.0–100.0)
Platelets: 245 10*3/uL (ref 150–400)
RBC: 2.83 MIL/uL — ABNORMAL LOW (ref 4.22–5.81)
RDW: 14.7 % (ref 11.5–15.5)
WBC: 12.7 10*3/uL — ABNORMAL HIGH (ref 4.0–10.5)
nRBC: 0 % (ref 0.0–0.2)

## 2022-08-27 LAB — CULTURE, BLOOD (ROUTINE X 2)

## 2022-08-27 LAB — URINE CULTURE

## 2022-08-27 LAB — C-REACTIVE PROTEIN: CRP: 9.1 mg/dL — ABNORMAL HIGH (ref <1.0)

## 2022-08-27 MED ORDER — HYDRALAZINE HCL 20 MG/ML IJ SOLN: 10.0000 mg | Freq: Four times a day (QID) | INTRAMUSCULAR | Status: DC | PRN

## 2022-08-27 MED ORDER — IOHEXOL 350 MG/ML SOLN
75.0000 mL | Freq: Once | INTRAVENOUS | Status: AC | PRN
  Administered 2022-08-27: 75 mL via INTRAVENOUS

## 2022-08-27 NOTE — Progress Notes (Signed)
Pharmacy Antibiotic Note  Jesse FIEGEL Sr. is a 89 y.o. male admitted on 08/24/2022 with possible infection of RLE foot wound.  Pharmacy has been consulted for vancomycin, cefepime dosing. Patient is also on metronidazole.   WBC trending down. Afebrile.  Scr used: 1.09 mg/dL - stable Weight: 16.1 kg - stable Vd coeff: 0.72 L/kg Est AUC: 442 on Vancomycin 1000mg  IV every 24 hrs  Plan: Continue Vancomycin 1000 mg IV every 24 hr Metronidazole per team Monitor cultures, clinical status, renal function Narrow abx as able 7 day end date entered per consult --Follow-up MRI and ID plans *If plan to continue vancomycin for prolonged course, will obtain levels   Height: 5\' 7"  (170.2 cm) Weight: 82.1 kg (181 lb) IBW/kg (Calculated) : 66.1  Temp (24hrs), Avg:98.1 F (36.7 C), Min:97.7 F (36.5 C), Max:98.5 F (36.9 C)  Recent Labs  Lab 08/24/22 1542 08/24/22 2201 08/24/22 2347 08/25/22 0455 08/26/22 0416 08/27/22 0319  WBC 6.0  --   --  19.8* 14.7* 12.7*  CREATININE 1.08  --   --  0.99 1.00 1.09  LATICACIDVEN  --  2.7* 2.3*  --   --   --     Estimated Creatinine Clearance: 44.3 mL/min (by C-G formula based on SCr of 1.09 mg/dL).    Allergies  Allergen Reactions   Codeine Swelling   Penicillins Swelling    Antimicrobials this admission: Vancomycin 5/7 >> 5/13 Cefepime 5/7 >> 5/9 Metronidazole 5/7 >>  Dose adjustments this admission: none  Microbiology results: 5/7 BCx: pend   Thank you for allowing pharmacy to be a part of this patient's care.  Link Snuffer, PharmD, BCPS, BCCCP Clinical Pharmacist Please check AMION for all [facility] phone numbers After 10:00 PM, call Main Pharmacy [phone]

## 2022-08-27 NOTE — Plan of Care (Signed)
  Problem: Education: Goal: Knowledge of General Education information will improve Description: Including pain rating scale, medication(s)/side effects and non-pharmacologic comfort measures Outcome: Progressing   Problem: Clinical Measurements: Goal: Ability to maintain clinical measurements within normal limits will improve Outcome: Progressing   

## 2022-08-27 NOTE — TOC Progression Note (Addendum)
Transition of Care (TOC) - Progression Note    Patient Details  Name: Jesse MULBERRY Sr. MRN: [id number] Date of Birth: 12/31/1928  Transition of [facility]) CM/SW Contact  Carley Hammed, LCSW Phone Number: 08/27/2022, 12:24 PM  Clinical Narrative:    CSW reviewed chart and noted previous CSW completed workup and has [facility] following and TOC will need to discuss with them when closer to DC. Barriers at this time include continued medical workup. Pt will not need an auth prior to DC, will meet his 3 inpatient stay tomorrow. Pt is a [facility] pt. TOC will continue to follow for DC needs.    Expected Discharge Plan: Skilled Nursing Facility Barriers to Discharge: Continued Medical Work up  Expected Discharge Plan and Services In-house Referral: Clinical Social Work Discharge Planning Services: CM Consult Post Acute Care Choice: Skilled Nursing Facility Living arrangements for the past 2 months: Single Family Home                                       Social Determinants of Health (SDOH) Interventions SDOH Screenings   Food Insecurity: No Food Insecurity (08/25/2022)  Housing: Low Risk  (08/25/2022)  Transportation Needs: No Transportation Needs (08/25/2022)  Utilities: Not At Risk (08/25/2022)  Alcohol Screen: Low Risk  (07/24/2019)  Depression (PHQ2-9): Low Risk  (07/24/2019)  Financial Resource Strain: Low Risk  (07/24/2019)  Physical Activity: Inactive (07/24/2019)  Social Connections: Moderately Integrated (07/24/2019)  Stress: No Stress Concern Present (07/24/2019)  Tobacco Use: Low Risk  (08/24/2022)    Readmission Risk Interventions     No data to display

## 2022-08-27 NOTE — Hospital Course (Addendum)
Jesse Pollock Sr. is a 89 y.o. male with a history of hypertension, BPH, prostate cancer presented secondary to generalized weakness, difficulty ambulation a few weeks on 08/25/22  Seen in the ED, found to have anemia, lactic acidosis underwent CT angio chest, CT abdomen pelvis, x-ray left foot right foot chest x-ray> and admitted for sepsis/fever, lactic acidosis with right foot ulcer, bronchitis Workup revealed gram-positive bacteremia, Aerococcus//Actinotignum schaalii bacteremia right foot osteomyelitis of fifth metatarsal head.  ABI noted poor circulation/critical ischemia with tissue loss at the toes- underwent angiogram  5/16:with recanalization/balloon angioplasty of the tibioperoneal trunk and resolution of flow-limiting stenosis within the posterior tibial artery by vascular Dr Zella Ball. Dr. Lajoyce Corners has been consulted> planning for right foot fifth ray amputation. Seen by infectious disease, completed antibiotics and monitoring off antibiotic Seen by cardiology for new onset A-fib given his high CHA2DS2-VASc score placed on heparin drip 5/20

## 2022-08-27 NOTE — Progress Notes (Signed)
PROGRESS NOTE    Jesse KREITLER Sr.  ZOX:[id number] DOB: Aug 26, 1928 DOA: 08/24/2022 PCP: Elfredia Nevins, MD   Brief Narrative: Jesse Pollock Sr. is a 89 y.o. male with a history of hypertension, BPH, prostate cancer.  Patient presented secondary to generalized weakness with evidence of sepsis on admission.  Patient was started on empiric antibiotics for found to have gram-positive cocci bacteremia.  Patient also noted to have a right foot ulcer concerning for possible osteomyelitis.  Infectious disease consulted.   Assessment and Plan:  Sepsis Present on admission. Secondary to gram positive bacteremia. Patient was started empirically on Vancomycin, Cefepime and Flagyl and narrowed to Vancomycin and Flagyl.  Leukocytosis is improving from 19,800 on admission down to 12,700 today.  Gram positive bacteremia Bacteria identification is pending. Concern this could be related to possible osteomyelitis. ID consulted. -ID recommendations: Continue Vancomycin and Flagyl, CT, cultures  Right foot ulcer Patient with peripheral neuropathy, which likely contributed to development and worsening. MRI attempted by failed. CT foot obtained and is pending. -Follow-up CT  Peripheral neuropathy Possibly related to vitamin B12 deficiency. Folate is normal. Patient started on vitamin B12 supplementation -Continue Vitamin B12  Progressive weakness Deconditioning but could also be related to ongoing bacteremia. PT/OT recommending SNF on discharge.  Prostate cancer Patient follows with urology. During this admission, sclerotic focus noted on left second rib and is nonspecific. May need to discuss with IR prior to discharge.  Atrial fibrillation with slow ventricular response Stable currently. Transthoracic Echocardiogram ordered and is significant for mildly dilated left atrium with normal LVEF.  Pressure injury Left heel, right buttocks.  Both present on admission.   DVT prophylaxis: Lovenox Code  Status:   Code Status: Full Code Family Communication: Son and daughter-in-law at bedside Disposition Plan: Discharge home likely in several days pending continued management/workup for bacteremia and foot ulcer   Consultants:  Infectious disease  Procedures:  5/9: Transthoracic echocardiogram  Antimicrobials: Vancomycin Cefepime Flagyl   Subjective: Patient reports no issues. He was not able to tolerate MRI secondary to   Objective: BP (!) 158/60 (BP Location: Left Arm)   Pulse (!) 48   Temp 98.5 F (36.9 C) (Oral)   Resp 17   Ht 5\' 7"  (1.702 m)   Wt 82.1 kg   SpO2 100%   BMI 28.35 kg/m   Examination:  General exam: Appears calm and comfortable Respiratory system: Clear to auscultation. Respiratory effort normal. Cardiovascular system: S1 & S2 heard, RRR. No murmurs, rubs, gallops or clicks. Gastrointestinal system: Abdomen is nondistended, soft and nontender. No organomegaly or masses felt. Normal bowel sounds heard. Central nervous system: Alert and oriented. Musculoskeletal: No edema. No calf tenderness Skin: No cyanosis. Right foot noted to have a laterally located foot ulcer with tan, leather tissue Psychiatry: Judgement and insight appear normal. Mood & affect appropriate.    Data Reviewed: I have personally reviewed following labs and imaging studies  CBC Lab Results  Component Value Date   WBC 12.7 (H) 08/27/2022   RBC 2.83 (L) 08/27/2022   HGB 9.5 (L) 08/27/2022   HCT 29.2 (L) 08/27/2022   MCV 103.2 (H) 08/27/2022   MCH 33.6 08/27/2022   PLT 245 08/27/2022   MCHC 32.5 08/27/2022   RDW 14.7 08/27/2022   LYMPHSABS 2.2 08/24/2022   MONOABS 0.4 08/24/2022   EOSABS 0.1 08/24/2022   BASOSABS 0.1 08/24/2022     Last metabolic panel Lab Results  Component Value Date   NA 135 08/27/2022  K 3.6 08/27/2022   CL 103 08/27/2022   CO2 21 (L) 08/27/2022   BUN 27 (H) 08/27/2022   CREATININE 1.09 08/27/2022   GLUCOSE 103 (H) 08/27/2022   GFRNONAA  >60 08/27/2022   GFRAA 55 (L) 07/24/2019   CALCIUM 8.6 (L) 08/27/2022   PHOS 3.3 08/25/2022   PROT 5.9 (L) 08/27/2022   ALBUMIN 2.6 (L) 08/27/2022   BILITOT 0.8 08/27/2022   ALKPHOS 46 08/27/2022   AST 33 08/27/2022   ALT 18 08/27/2022   ANIONGAP 11 08/27/2022    GFR: Estimated Creatinine Clearance: 44.3 mL/min (by C-G formula based on SCr of 1.09 mg/dL).  Recent Results (from the past 240 hour(s))  Urine Culture (for pregnant, neutropenic or urologic patients or patients with an indwelling urinary catheter)     Status: Abnormal (Preliminary result)   Collection Time: 08/24/22  4:50 PM   Specimen: Urine, Clean Catch  Result Value Ref Range Status   Specimen Description   Final    URINE, CLEAN CATCH Performed at [facility], [address] [place], [place] [address]    Special Requests   Final    NONE Performed at [facility], [address] [place], [place] [address]    Culture (A)  Final    >=100,000 COLONIES/mL GRAM POSITIVE COCCI IDENTIFICATION AND SUSCEPTIBILITIES TO FOLLOW Performed at [facility], [address], [place], [place] [address]    Report Status PENDING  Incomplete  Resp panel by RT-PCR (RSV, Flu A&B, Covid) Anterior Nasal Swab     Status: None   Collection Time: 08/24/22  9:55 PM   Specimen: Anterior Nasal Swab  Result Value Ref Range Status   SARS Coronavirus 2 by RT PCR NEGATIVE NEGATIVE Final    Comment: (NOTE) SARS-CoV-2 target nucleic acids are NOT DETECTED.  The SARS-CoV-2 RNA is generally detectable in upper respiratory specimens during the acute phase of infection. The lowest concentration of SARS-CoV-2 viral copies this assay can detect is 138 copies/mL. A negative result does not preclude SARS-Cov-2 infection and should not be used as the sole basis for treatment or other patient management decisions. A negative result may occur with  improper specimen collection/handling, submission of specimen other than nasopharyngeal swab,  presence of viral mutation(s) within the areas targeted by this assay, and inadequate number of viral copies(<138 copies/mL). A negative result must be combined with clinical observations, patient history, and epidemiological information. The expected result is Negative.  Fact Sheet for Patients:  BloggerCourse.com  Fact Sheet for Healthcare Providers:  SeriousBroker.it  This test is no t yet approved or cleared by the [place] FDA and  has been authorized for detection and/or diagnosis of SARS-CoV-2 by FDA under an Emergency Use Authorization (EUA). This EUA will remain  in effect (meaning this test can be used) for the duration of the COVID-19 declaration under Section 564(b)(1) of the Act, 21 U.S.C.section 360bbb-3(b)(1), unless the authorization is terminated  or revoked sooner.       Influenza A by PCR NEGATIVE NEGATIVE Final   Influenza B by PCR NEGATIVE NEGATIVE Final    Comment: (NOTE) The Xpert Xpress SARS-CoV-2/FLU/RSV plus assay is intended as an aid in the diagnosis of influenza from Nasopharyngeal swab specimens and should not be used as a sole basis for treatment. Nasal washings and aspirates are unacceptable for Xpert Xpress SARS-CoV-2/FLU/RSV testing.  Fact Sheet for Patients: BloggerCourse.com  Fact Sheet for Healthcare Providers: SeriousBroker.it  This test is not yet approved or cleared by the  [place] [employer] trader and has been authorized for detection and/or diagnosis of SARS-CoV-2 by FDA under an TEFL teacher (EUA). This EUA will remain in effect (meaning this test can be used) for the duration of the COVID-19 declaration under Section 564(b)(1) of the Act, 21 U.S.C. section 360bbb-3(b)(1), unless the authorization is terminated or revoked.     Resp Syncytial Virus by PCR NEGATIVE NEGATIVE Final    Comment: (NOTE) Fact Sheet for  Patients: BloggerCourse.com  Fact Sheet for Healthcare Providers: SeriousBroker.it  This test is not yet approved or cleared by the [place] FDA and has been authorized for detection and/or diagnosis of SARS-CoV-2 by FDA under an Emergency Use Authorization (EUA). This EUA will remain in effect (meaning this test can be used) for the duration of the COVID-19 declaration under Section 564(b)(1) of the Act, 21 U.S.C. section 360bbb-3(b)(1), unless the authorization is terminated or revoked.  Performed at [facility], [address] [place], [place] [address]   Blood Culture (routine x 2)     Status: None (Preliminary result)   Collection Time: 08/24/22 10:01 PM   Specimen: BLOOD  Result Value Ref Range Status   Specimen Description   Final    BLOOD BLOOD LEFT HAND Performed at [facility], [address] [place], [place] [address]    Special Requests   Final    BOTTLES DRAWN AEROBIC AND ANAEROBIC Blood Culture adequate volume Performed at [facility], [address], [place], [place] [address]    Culture  Setup Time   Final    GRAM POSITIVE COCCI IN BOTH AEROBIC AND ANAEROBIC BOTTLES Gram Stain Report Called to,Read Back By and Verified With: PIPKIN,C ON 08/25/22 AT 2110 BY LOY,C CRITICAL RESULT CALLED TO, READ BACK BY AND VERIFIED WITH: C PIPKINS,[email or web address]  11/26/22 [facility] Performed at [facility], [address] [place], [place] [address]    Culture   Final    GRAM POSITIVE COCCI IDENTIFICATION TO FOLLOW Performed at [facility], [address], [place], [place] [address]    Report Status PENDING  Incomplete  Blood Culture ID Panel (Reflexed)     Status: None   Collection Time: 08/24/22 10:01 PM  Result Value Ref Range Status   Enterococcus faecalis NOT DETECTED NOT DETECTED Final   Enterococcus Faecium NOT DETECTED NOT DETECTED Final   Listeria monocytogenes NOT DETECTED NOT DETECTED Final   Staphylococcus species  NOT DETECTED NOT DETECTED Final   Staphylococcus aureus (BCID) NOT DETECTED NOT DETECTED Final   Staphylococcus epidermidis NOT DETECTED NOT DETECTED Final   Staphylococcus lugdunensis NOT DETECTED NOT DETECTED Final   Streptococcus species NOT DETECTED NOT DETECTED Final   Streptococcus agalactiae NOT DETECTED NOT DETECTED Final   Streptococcus pneumoniae NOT DETECTED NOT DETECTED Final   Streptococcus pyogenes NOT DETECTED NOT DETECTED Final   A.calcoaceticus-baumannii NOT DETECTED NOT DETECTED Final   Bacteroides fragilis NOT DETECTED NOT DETECTED Final   Enterobacterales NOT DETECTED NOT DETECTED Final   Enterobacter cloacae complex NOT DETECTED NOT DETECTED Final   Escherichia coli NOT DETECTED NOT DETECTED Final   Klebsiella aerogenes NOT DETECTED NOT DETECTED Final   Klebsiella oxytoca NOT DETECTED NOT DETECTED Final   Klebsiella pneumoniae NOT DETECTED NOT DETECTED Final   Proteus species NOT DETECTED NOT DETECTED Final   Salmonella species NOT DETECTED NOT DETECTED Final   Serratia marcescens NOT DETECTED NOT DETECTED Final   Haemophilus influenzae NOT DETECTED NOT DETECTED Final   Neisseria meningitidis NOT DETECTED NOT DETECTED Final  Pseudomonas aeruginosa NOT DETECTED NOT DETECTED Final   Stenotrophomonas maltophilia NOT DETECTED NOT DETECTED Final   Candida albicans NOT DETECTED NOT DETECTED Final   Candida auris NOT DETECTED NOT DETECTED Final   Candida glabrata NOT DETECTED NOT DETECTED Final   Candida krusei NOT DETECTED NOT DETECTED Final   Candida parapsilosis NOT DETECTED NOT DETECTED Final   Candida tropicalis NOT DETECTED NOT DETECTED Final   Cryptococcus neoformans/gattii NOT DETECTED NOT DETECTED Final    Comment: Performed at [facility], [address], [place], [place] [address]  Blood Culture (routine x 2)     Status: None (Preliminary result)   Collection Time: 08/24/22 10:02 PM   Specimen: BLOOD  Result Value Ref Range Status   Specimen  Description   Final    BLOOD BLOOD LEFT WRIST Performed at [facility], [address], [place], [place] [address]    Special Requests   Final    BOTTLES DRAWN AEROBIC AND ANAEROBIC Blood Culture adequate volume Performed at [facility], [address] [place], [place] [address]    Culture  Setup Time   Final    GRAM POSITIVE COCCI AEROBIC BOTTLE ONLY CRITICAL VALUE NOTED.  VALUE IS CONSISTENT WITH PREVIOUSLY REPORTED AND CALLED VALUE. GRAM POSITIVE RODS ANAEROBIC BOTTLE ONLY Gram Stain Report Called to,Read Back By and Verified With: MADISON,GINGER @ [phone] ON 08/26/2022 BY FRATTO,ASHLEY Performed at [facility], [address], [place], [place] [address]    Culture [facility] POSITIVE COCCI  Final   Report Status PENDING  Incomplete  Culture, blood (Routine X 2) w Reflex to ID Panel     Status: None (Preliminary result)   Collection Time: 08/26/22 11:50 AM   Specimen: BLOOD RIGHT HAND  Result Value Ref Range Status   Specimen Description BLOOD RIGHT HAND  Final   Special Requests   Final    BOTTLES DRAWN AEROBIC AND ANAEROBIC Blood Culture results may not be optimal due to an excessive volume of blood received in culture bottles   Culture   Final    NO GROWTH < 24 HOURS Performed at [facility], [address], [place], [place] [address]    Report Status PENDING  Incomplete  Culture, blood (Routine X 2) w Reflex to ID Panel     Status: None (Preliminary result)   Collection Time: 08/26/22 11:50 AM   Specimen: Right Antecubital; Blood  Result Value Ref Range Status   Specimen Description RIGHT ANTECUBITAL  Final   Special Requests   Final    BOTTLES DRAWN AEROBIC AND ANAEROBIC Blood Culture adequate volume   Culture   Final    NO GROWTH < 24 HOURS Performed at [facility], [address], [place], [place] [address]    Report Status PENDING  Incomplete      Radiology Studies: ECHOCARDIOGRAM COMPLETE  Result Date: 08/26/2022    ECHOCARDIOGRAM REPORT   Patient Name:   Jesse NORTHCUTT Sr. Date of Exam: 08/26/2022 Medical Rec #:  [id number]        Height:       67.0 in Accession #:    [id number]       Weight:       181.0 lb Date of Birth:  June 28, 1928        BSA:          1.938 m Patient Age:    89 years         BP:  137/61 mmHg Patient Gender: M                HR:           58 bpm. [facility] Procedure: 2D Echo, Cardiac Doppler and Color Doppler Indications:    Atrial Fibrillation I48.91  History:        Patient has no prior history of Echocardiogram examinations.                 Arrythmias:Atrial Fibrillation, Signs/Symptoms:Fever; Risk                 Factors:Hypertension and Non-Smoker.  Sonographer:    Aron Baba Referring Phys: [id number]  Sonographer Comments: No subcostal window. Image acquisition challenging due to patient body habitus and Image acquisition challenging due to respiratory motion. IMPRESSIONS  1. Left ventricular ejection fraction, by estimation, is 65 to 70%. The left ventricle has normal function. The left ventricle has no regional wall motion abnormalities. There is mild left ventricular hypertrophy. Left ventricular diastolic parameters are indeterminate.  2. Right ventricular systolic function is normal. The right ventricular size is normal. There is normal pulmonary artery systolic pressure. The estimated right ventricular systolic pressure is 14.2 mmHg.  3. Left atrial size was mildly dilated.  4. The mitral valve is degenerative. Mild mitral valve regurgitation.  5. The aortic valve is tricuspid. Aortic valve regurgitation is trivial.  6. The inferior vena cava is normal in size with <50% respiratory variability, suggesting right atrial pressure of 8 mmHg. Comparison(s): No prior Echocardiogram. FINDINGS  Left Ventricle: Left ventricular ejection fraction, by estimation, is 65 to 70%. The left ventricle has normal function. The left ventricle has no regional wall motion abnormalities. The left ventricular internal cavity size was  normal in size. There is  mild left ventricular hypertrophy. Left ventricular diastolic function could not be evaluated due to atrial fibrillation. Left ventricular diastolic parameters are indeterminate. Right Ventricle: The right ventricular size is normal. No increase in right ventricular wall thickness. Right ventricular systolic function is normal. There is normal pulmonary artery systolic pressure. The tricuspid regurgitant velocity is 1.24 m/s, and  with an assumed right atrial pressure of 8 mmHg, the estimated right ventricular systolic pressure is 14.2 mmHg. Left Atrium: Left atrial size was mildly dilated. Right Atrium: Right atrial size was normal in size. Pericardium: There is no evidence of pericardial effusion. Mitral Valve: The mitral valve is degenerative in appearance. There is mild calcification of the mitral valve leaflet(s). Mild mitral annular calcification. Mild mitral valve regurgitation. Tricuspid Valve: The tricuspid valve is grossly normal. Tricuspid valve regurgitation is mild. Aortic Valve: The aortic valve is tricuspid. There is mild aortic valve annular calcification. Aortic valve regurgitation is trivial. Pulmonic Valve: The pulmonic valve was not well visualized. Pulmonic valve regurgitation is trivial. Aorta: The aortic root is normal in size and structure. Venous: The inferior vena cava is normal in size with less than 50% respiratory variability, suggesting right atrial pressure of 8 mmHg. IAS/Shunts: No atrial level shunt detected by color flow Doppler.  LEFT VENTRICLE PLAX 2D LVIDd:         4.20 cm   Diastology LVIDs:         2.90 cm   LV e' medial:    6.60 cm/s LV PW:         1.30 cm   LV E/e' medial:  18.0 LV IVS:        0.70 cm  LV e' lateral:   8.78 cm/s LVOT diam:     1.60 cm   LV E/e' lateral: 13.6 LV SV:         35 LV SV Index:   18 LVOT Area:     2.01 cm  RIGHT VENTRICLE RV S prime:     9.90 cm/s TAPSE (M-mode): 1.2 cm LEFT ATRIUM           Index        RIGHT ATRIUM            Index LA diam:      3.30 cm 1.70 cm/m   RA Area:     13.10 cm LA Vol (A2C): 47.4 ml 24.46 ml/m  RA Volume:   30.60 ml  15.79 ml/m LA Vol (A4C): 73.5 ml 37.92 ml/m  AORTIC VALVE LVOT Vmax:   81.80 cm/s LVOT Vmean:  56.400 cm/s LVOT VTI:    0.174 m  AORTA Ao Root diam: 3.50 cm Ao Asc diam:  3.20 cm MITRAL VALVE                TRICUSPID VALVE MV Area (PHT): 3.99 cm     TR Peak grad:   6.2 mmHg MV Decel Time: 190 msec     TR Vmax:        124.00 cm/s MR PISA:        0.57 cm MR PISA Radius: 0.30 cm     SHUNTS MV E velocity: 119.00 cm/s  Systemic VTI:  0.17 m                             Systemic Diam: 1.60 cm Nona Dell MD Electronically signed by Nona Dell MD Signature Date/Time: 08/26/2022/11:44:16 AM    Final       LOS: 2 days    Jacquelin Hawking, MD [facility] 08/27/2022, 10:28 AM   If 7PM-7AM, please contact night-coverage [email or web address]

## 2022-08-27 NOTE — Progress Notes (Signed)
[facility]  Date of Admission:  08/24/2022     Total days of antibiotics 4         ASSESSMENT:  Mr. Newsum blood cultures remain with organism identification pending. Unfortunately unable to complete MRI and will cancel MRI and order CT scan to evaluate for osteomyelitis of the right foot. Continue current dose of vancomycin and metronidazole pending organism identification on blood cultures. Blood cultures from 5/9 are without growth in <24 hours. Remaining medical and supportive care per Internal Medicine.   PLAN:  Continue current dose of vancomycin and metronidazole.  Monitor cultures for organism identification and clearance of blood cultures. CT to evaluate right foot for osteomyelitis.  Remaining medical and supportive care per Internal Medicine.   Dr. Drue Second is available this weekend for ID related questions.  Principal Problem:   Polymicrobial bacterial infection Active Problems:   Fever of undetermined origin   Lactic acidosis   Generalized weakness   Bronchitis   Bilateral pleural effusion   Right foot ulcer (HCC)   Essential hypertension   BPH (benign prostatic hyperplasia)   Paroxysmal atrial fibrillation (HCC)    cyanocobalamin  1,000 mcg Intramuscular Daily   dextromethorphan-guaiFENesin  1 tablet Oral BID   enoxaparin (LOVENOX) injection  40 mg Subcutaneous Q24H   furosemide  20 mg Intravenous Daily   leptospermum manuka honey  1 Application Topical Daily   metroNIDAZOLE  500 mg Oral Q12H   multivitamin with minerals  1 tablet Oral Daily   pantoprazole  40 mg Oral Daily   tamsulosin  0.4 mg Oral QPC supper    SUBJECTIVE:  Afebrile overnight with no acute events. Unable to complete MRI secondary to movement.   Allergies  Allergen Reactions   Codeine Swelling   Penicillins Swelling     Review of Systems: Review of Systems  Constitutional:  Negative for chills, fever and weight loss.  Respiratory:  Negative for  cough, shortness of breath and wheezing.   Cardiovascular:  Negative for chest pain and leg swelling.  Gastrointestinal:  Negative for abdominal pain, constipation, diarrhea, nausea and vomiting.  Skin:  Negative for rash.      OBJECTIVE: Vitals:   08/26/22 2014 08/26/22 2345 08/27/22 0428 08/27/22 0916  BP: 125/83 (!) 146/63 (!) 153/67 (!) 158/60  Pulse: 65 61 (!) 55 (!) 48  Resp: 18 18 18 17   Temp: 98.3 F (36.8 C) 97.9 F (36.6 C) 97.7 F (36.5 C) 98.5 F (36.9 C)  TempSrc: Oral Oral Oral Oral  SpO2: 98% 100% 100% 100%  Weight:      Height:       Body mass index is 28.35 kg/m.  Physical Exam Constitutional:      General: He is not in acute distress.    Appearance: He is well-developed.     Comments: Laying in bed with head of bed elevated; hard of hearing  Cardiovascular:     Rate and Rhythm: Normal rate and regular rhythm.     Heart sounds: Normal heart sounds.  Pulmonary:     Effort: Pulmonary effort is normal.     Breath sounds: Normal breath sounds.  Skin:    General: Skin is warm and dry.  Neurological:     Mental Status: He is alert.  Psychiatric:        Mood and Affect: Mood normal.     Lab Results Lab Results  Component Value Date   WBC 12.7 (H) 08/27/2022   HGB 9.5 (  L) 08/27/2022   HCT 29.2 (L) 08/27/2022   MCV 103.2 (H) 08/27/2022   PLT 245 08/27/2022    Lab Results  Component Value Date   CREATININE 1.09 08/27/2022   BUN 27 (H) 08/27/2022   NA 135 08/27/2022   K 3.6 08/27/2022   CL 103 08/27/2022   CO2 21 (L) 08/27/2022    Lab Results  Component Value Date   ALT 18 08/27/2022   AST 33 08/27/2022   ALKPHOS 46 08/27/2022   BILITOT 0.8 08/27/2022     Microbiology: Recent Results (from the past 240 hour(s))  Urine Culture (for pregnant, neutropenic or urologic patients or patients with an indwelling urinary catheter)     Status: Abnormal (Preliminary result)   Collection Time: 08/24/22  4:50 PM   Specimen: Urine, Clean Catch   Result Value Ref Range Status   Specimen Description   Final    URINE, CLEAN CATCH Performed at [facility], [address], [place], [place] [address]    Special Requests   Final    NONE Performed at [facility], [address], [place], [place] [address]    Culture (A)  Final    >=100,000 COLONIES/mL GRAM POSITIVE COCCI IDENTIFICATION AND SUSCEPTIBILITIES TO FOLLOW Performed at [facility], [address] [place], [place] [address]    Report Status PENDING  Incomplete  Resp panel by RT-PCR (RSV, Flu A&B, Covid) Anterior Nasal Swab     Status: None   Collection Time: 08/24/22  9:55 PM   Specimen: Anterior Nasal Swab  Result Value Ref Range Status   SARS Coronavirus 2 by RT PCR NEGATIVE NEGATIVE Final    Comment: (NOTE) SARS-CoV-2 target nucleic acids are NOT DETECTED.  The SARS-CoV-2 RNA is generally detectable in upper respiratory specimens during the acute phase of infection. The lowest concentration of SARS-CoV-2 viral copies this assay can detect is 138 copies/mL. A negative result does not preclude SARS-Cov-2 infection and should not be used as the sole basis for treatment or other patient management decisions. A negative result may occur with  improper specimen collection/handling, submission of specimen other than nasopharyngeal swab, presence of viral mutation(s) within the areas targeted by this assay, and inadequate number of viral copies(<138 copies/mL). A negative result must be combined with clinical observations, patient history, and epidemiological information. The expected result is Negative.  Fact Sheet for Patients:  BloggerCourse.com  Fact Sheet for Healthcare Providers:  SeriousBroker.it  This test is no t yet approved or cleared by the [place] FDA and  has been authorized for detection and/or diagnosis of SARS-CoV-2 by FDA under an Emergency Use Authorization (EUA). This EUA will remain   in effect (meaning this test can be used) for the duration of the COVID-19 declaration under Section 564(b)(1) of the Act, 21 U.S.C.section 360bbb-3(b)(1), unless the authorization is terminated  or revoked sooner.       Influenza A by PCR NEGATIVE NEGATIVE Final   Influenza B by PCR NEGATIVE NEGATIVE Final    Comment: (NOTE) The Xpert Xpress SARS-CoV-2/FLU/RSV plus assay is intended as an aid in the diagnosis of influenza from Nasopharyngeal swab specimens and should not be used as a sole basis for treatment. Nasal washings and aspirates are unacceptable for Xpert Xpress SARS-CoV-2/FLU/RSV testing.  Fact Sheet for Patients: BloggerCourse.com  Fact Sheet for Healthcare Providers: SeriousBroker.it  This test is not yet approved or cleared by the [place] FDA and has been authorized for detection and/or diagnosis of SARS-CoV-2 by FDA  under an Emergency Use Authorization (EUA). This EUA will remain in effect (meaning this test can be used) for the duration of the COVID-19 declaration under Section 564(b)(1) of the Act, 21 U.S.C. section 360bbb-3(b)(1), unless the authorization is terminated or revoked.     Resp Syncytial Virus by PCR NEGATIVE NEGATIVE Final    Comment: (NOTE) Fact Sheet for Patients: BloggerCourse.com  Fact Sheet for Healthcare Providers: SeriousBroker.it  This test is not yet approved or cleared by the [place] FDA and has been authorized for detection and/or diagnosis of SARS-CoV-2 by FDA under an Emergency Use Authorization (EUA). This EUA will remain in effect (meaning this test can be used) for the duration of the COVID-19 declaration under Section 564(b)(1) of the Act, 21 U.S.C. section 360bbb-3(b)(1), unless the authorization is terminated or revoked.  Performed at [facility], [address], [place], [place] [address]   Blood Culture  (routine x 2)     Status: None (Preliminary result)   Collection Time: 08/24/22 10:01 PM   Specimen: BLOOD  Result Value Ref Range Status   Specimen Description   Final    BLOOD BLOOD LEFT HAND Performed at [facility], [address], [place], [place] [address]    Special Requests   Final    BOTTLES DRAWN AEROBIC AND ANAEROBIC Blood Culture adequate volume Performed at [facility], [address], [place], [place] [address]    Culture  Setup Time   Final    GRAM POSITIVE COCCI IN BOTH AEROBIC AND ANAEROBIC BOTTLES Gram Stain Report Called to,Read Back By and Verified With: PIPKIN,C ON 08/25/22 AT 2110 BY LOY,C CRITICAL RESULT CALLED TO, READ BACK BY AND VERIFIED WITH: C PIPKINS,RN@[id number]  11/26/22 [facility] Performed at [facility], [address] [place], [place] [address]    Culture   Final    GRAM POSITIVE COCCI IDENTIFICATION TO FOLLOW Performed at [facility], [address] [place], [place] [address]    Report Status PENDING  Incomplete  Blood Culture ID Panel (Reflexed)     Status: None   Collection Time: 08/24/22 10:01 PM  Result Value Ref Range Status   Enterococcus faecalis NOT DETECTED NOT DETECTED Final   Enterococcus Faecium NOT DETECTED NOT DETECTED Final   Listeria monocytogenes NOT DETECTED NOT DETECTED Final   Staphylococcus species NOT DETECTED NOT DETECTED Final   Staphylococcus aureus (BCID) NOT DETECTED NOT DETECTED Final   Staphylococcus epidermidis NOT DETECTED NOT DETECTED Final   Staphylococcus lugdunensis NOT DETECTED NOT DETECTED Final   Streptococcus species NOT DETECTED NOT DETECTED Final   Streptococcus agalactiae NOT DETECTED NOT DETECTED Final   Streptococcus pneumoniae NOT DETECTED NOT DETECTED Final   Streptococcus pyogenes NOT DETECTED NOT DETECTED Final   A.calcoaceticus-baumannii NOT DETECTED NOT DETECTED Final   Bacteroides fragilis NOT DETECTED NOT DETECTED Final   Enterobacterales NOT DETECTED NOT DETECTED Final   Enterobacter cloacae  complex NOT DETECTED NOT DETECTED Final   Escherichia coli NOT DETECTED NOT DETECTED Final   Klebsiella aerogenes NOT DETECTED NOT DETECTED Final   Klebsiella oxytoca NOT DETECTED NOT DETECTED Final   Klebsiella pneumoniae NOT DETECTED NOT DETECTED Final   Proteus species NOT DETECTED NOT DETECTED Final   Salmonella species NOT DETECTED NOT DETECTED Final   Serratia marcescens NOT DETECTED NOT DETECTED Final   Haemophilus influenzae NOT DETECTED NOT DETECTED Final   Neisseria meningitidis NOT DETECTED NOT DETECTED Final   Pseudomonas aeruginosa NOT DETECTED NOT DETECTED Final   Stenotrophomonas maltophilia NOT DETECTED NOT DETECTED  Final   Candida albicans NOT DETECTED NOT DETECTED Final   Candida auris NOT DETECTED NOT DETECTED Final   Candida glabrata NOT DETECTED NOT DETECTED Final   Candida krusei NOT DETECTED NOT DETECTED Final   Candida parapsilosis NOT DETECTED NOT DETECTED Final   Candida tropicalis NOT DETECTED NOT DETECTED Final   Cryptococcus neoformans/gattii NOT DETECTED NOT DETECTED Final    Comment: Performed at [facility], [address], [place], [place] [address]  Blood Culture (routine x 2)     Status: None (Preliminary result)   Collection Time: 08/24/22 10:02 PM   Specimen: BLOOD  Result Value Ref Range Status   Specimen Description   Final    BLOOD BLOOD LEFT WRIST Performed at [facility], [address] [place], [place] [address]    Special Requests   Final    BOTTLES DRAWN AEROBIC AND ANAEROBIC Blood Culture adequate volume Performed at [facility], [address], [place], [place] [address]    Culture  Setup Time   Final    GRAM POSITIVE COCCI AEROBIC BOTTLE ONLY CRITICAL VALUE NOTED.  VALUE IS CONSISTENT WITH PREVIOUSLY REPORTED AND CALLED VALUE. GRAM POSITIVE RODS ANAEROBIC BOTTLE ONLY Gram Stain Report Called to,Read Back By and Verified With: MADISON,GINGER @ [phone] ON 08/26/2022 BY FRATTO,ASHLEY Performed at [facility], [address], [place], [place] [address]    Culture [facility] POSITIVE COCCI  Final   Report Status PENDING  Incomplete  Culture, blood (Routine X 2) w Reflex to ID Panel     Status: None (Preliminary result)   Collection Time: 08/26/22 11:50 AM   Specimen: BLOOD RIGHT HAND  Result Value Ref Range Status   Specimen Description BLOOD RIGHT HAND  Final   Special Requests   Final    BOTTLES DRAWN AEROBIC AND ANAEROBIC Blood Culture results may not be optimal due to an excessive volume of blood received in culture bottles   Culture   Final    NO GROWTH < 24 HOURS Performed at [facility], [address], [place], [place] [address]    Report Status PENDING  Incomplete  Culture, blood (Routine X 2) w Reflex to ID Panel     Status: None (Preliminary result)   Collection Time: 08/26/22 11:50 AM   Specimen: Right Antecubital; Blood  Result Value Ref Range Status   Specimen Description RIGHT ANTECUBITAL  Final   Special Requests   Final    BOTTLES DRAWN AEROBIC AND ANAEROBIC Blood Culture adequate volume   Culture   Final    NO GROWTH < 24 HOURS Performed at [facility], [address], [place], [place] [address]    Report Status PENDING  Incomplete     Marcos Eke, NP [facility]  08/27/2022  10:36 AM

## 2022-08-28 ENCOUNTER — Inpatient Hospital Stay (HOSPITAL_COMMUNITY): Payer: Medicare Other

## 2022-08-28 DIAGNOSIS — I480 Paroxysmal atrial fibrillation: Secondary | ICD-10-CM | POA: Diagnosis not present

## 2022-08-28 DIAGNOSIS — L97514 Non-pressure chronic ulcer of other part of right foot with necrosis of bone: Secondary | ICD-10-CM | POA: Diagnosis not present

## 2022-08-28 DIAGNOSIS — J40 Bronchitis, not specified as acute or chronic: Secondary | ICD-10-CM | POA: Diagnosis not present

## 2022-08-28 DIAGNOSIS — A499 Bacterial infection, unspecified: Secondary | ICD-10-CM | POA: Diagnosis not present

## 2022-08-28 DIAGNOSIS — L97909 Non-pressure chronic ulcer of unspecified part of unspecified lower leg with unspecified severity: Secondary | ICD-10-CM

## 2022-08-28 LAB — CULTURE, BLOOD (ROUTINE X 2)

## 2022-08-28 NOTE — Progress Notes (Signed)
Physical Therapy Treatment Patient Details Name: Jesse MAKELY Sr. MRN: [id number] DOB: February 03, 1929 Today's Date: 08/28/2022   History of Present Illness 89 y.o. male [facility] due to generalized progressive weakness. 5/8 found to be febrile with leukocytosis, started on antibiotics for sepsis with unclear source. 5/9 Transferred to [facility] hospital found to have gram-positive cocci bacteremia with possible R foot osteomyelitis. PMH includes hypertension, BPH, prostate cancer    PT Comments    Pt greeted resting in bed and agreeable to session with steady progress towards acute goals. Pt continues to be limited by LE weakness and pain, general fatigue and poor balance/postural reactions. Pt requiring grossly mod-max A for bed mobility and functional transfers with pt unable to progress gait this session due to posterior lean with pt needing max multimodal cues to shift weight anterior and achieve full upright standing.  Pt able to come to standing with RW support x3 trials and to standing in stedy frame x2 with mod-max A. Pt transferred to chair via stedy as pt unable to take steps with RW support this session. Pt able to complete seated LE exercises with cues for technique. Pt son and daughter in law present and supportive throughout session. Current plan remains appropriate to address deficits and maximize functional independence and decrease caregiver burden. Pt continues to benefit from skilled PT services to progress toward functional mobility goals.    Recommendations for follow up therapy are one component of a multi-disciplinary discharge planning process, led by the attending physician.  Recommendations may be updated based on patient status, additional functional criteria and insurance authorization.  Follow Up Recommendations  Can patient physically be transported by private vehicle: No    Assistance Recommended at Discharge    Patient can return home with the following  A lot of help with bathing/dressing/bathroom;A lot of help with walking and/or transfers;Help with stairs or ramp for entrance;Assistance with cooking/housework   Equipment Recommendations  None recommended by PT    Recommendations for Other Services       Precautions / Restrictions Precautions Precautions: Fall Restrictions Weight Bearing Restrictions: No     Mobility  Bed Mobility Overal bed mobility: Needs Assistance Bed Mobility: Supine to Sit     Supine to sit: Mod assist, HOB elevated     General bed mobility comments: increased time and effortful, mod A to elevate trunk and scoot out to EOB    Transfers Overall transfer level: Needs assistance Equipment used: Rolling walker (2 wheels) Transfers: Sit to/from Stand, Bed to chair/wheelchair/BSC Sit to Stand: Mod assist, Max assist, From elevated surface           General transfer comment: max A to come to stand from aslightly elevated EOB, pt with posterior lean needing multimodal cues to correct, mod A to stand wto steady and from stedy seat Transfer via Lift Equipment: Stedy  Ambulation/Gait               General Gait Details: pt unable this date   Stairs             Wheelchair Mobility    Modified Rankin (Stroke Patients Only)       Balance Overall balance assessment: Needs assistance Sitting-balance support: Feet supported, No upper extremity supported Sitting balance-Leahy Scale: Good Sitting balance - Comments: seated at EOB   Standing balance support: Bilateral upper extremity supported, During functional activity, Reliant on assistive device for balance Standing balance-Leahy Scale: Poor Standing balance comment: heavy reliance on  RW support, posterior lean                            Cognition Arousal/Alertness: Awake/alert Behavior During Therapy: WFL for tasks assessed/performed Overall Cognitive Status: Difficult to assess                                  General Comments: Oreiented to place, year was reported as 56 and 04.        Exercises General Exercises - Lower Extremity Long Arc Quad: AROM, Right, Left, 10 reps, Seated Hip Flexion/Marching: AROM, Right, Left, 10 reps, Seated    General Comments        Pertinent Vitals/Pain Pain Assessment Pain Assessment: No/denies pain    Home Living                          Prior Function            PT Goals (current goals can now be found in the care plan section) Acute Rehab PT Goals PT Goal Formulation: With patient Time For Goal Achievement: 09/08/22 Progress towards PT goals: Progressing toward goals    Frequency    Min 3X/week      PT Plan      Co-evaluation              AM-PAC PT "6 Clicks" Mobility   Outcome Measure  Help needed turning from your back to your side while in a flat bed without using bedrails?: A Lot Help needed moving from lying on your back to sitting on the side of a flat bed without using bedrails?: A Lot Help needed moving to and from a bed to a chair (including a wheelchair)?: A Lot Help needed standing up from a chair using your arms (e.g., wheelchair or bedside chair)?: A Lot Help needed to walk in hospital room?: Total Help needed climbing 3-5 steps with a railing? : Total 6 Click Score: 10    End of Session   Activity Tolerance: Patient tolerated treatment well;Patient limited by fatigue Patient left: in chair;with call bell/phone within reach;with family/visitor present Nurse Communication: Mobility status;Need for lift equipment (steady and +2 for back to bed) PT Visit Diagnosis: Unsteadiness on feet (R26.81);Other abnormalities of gait and mobility (R26.89);Muscle weakness (generalized) (M62.81)     Time: 1610-9604 PT Time Calculation (min) (ACUTE ONLY): 26 min  Charges:  $Therapeutic Activity: 23-37 mins                    Tyquisha Sharps R. PTA Acute Rehabilitation Services Office: [phone]    Catalina Antigua 08/28/2022, 2:49 PM

## 2022-08-28 NOTE — Progress Notes (Signed)
VASCULAR LAB    ABI has been performed.  See CV proc for preliminary results.   Tarshia Kot, RVT 08/28/2022, 1:36 PM

## 2022-08-28 NOTE — Progress Notes (Signed)
ID PROGRESS NOTE   Blood cx + aerococcus, CT of foot c/w possible osteomyelitis of 5th MTH due to cortical thinning and mild sclerotic changes.unable to do MRI.  Plan: Continue on current iv abtx Please check sed rate and crp Will follow up on sensitivities of aerococcus to finalize iv abtx, likley need to treat for deep tissue/osteo/DFU  Marra Fraga B. Drue Second MD MPH [facility] [phone]

## 2022-08-28 NOTE — Progress Notes (Signed)
PROGRESS NOTE    Jesse BAHN Sr.  WUJ:[id number] DOB: 1928/09/02 DOA: 08/24/2022 PCP: Elfredia Nevins, MD   Brief Narrative: Jesse Pollock Sr. is a 89 y.o. male with a history of hypertension, BPH, prostate cancer.  Patient presented secondary to generalized weakness with evidence of sepsis on admission.  Patient was started on empiric antibiotics for found to have gram-positive cocci bacteremia.  Patient also noted to have a right foot ulcer concerning for possible osteomyelitis.  Infectious disease consulted.   Assessment and Plan:  Sepsis Present on admission. Secondary to gram positive bacteremia. Patient was started empirically on Vancomycin, Cefepime and Flagyl and narrowed to Vancomycin and Flagyl.  Leukocytosis is improving from 19,800 on admission down to 12,700.  Aerococcus bacteremia GPR bacteremia Concern this could be related to possible osteomyelitis, however urine culture shows similar organism and is the likely source. Patient also found to have gram positive rods. -ID recommendations: Continue Vancomycin and Flagyl, cultures  Right foot ulcer Patient with peripheral neuropathy, which likely contributed to development and worsening. MRI attempted by failed. CT foot obtained and is suggestive of possible osteomyelitis but inconclusive. -ID recommendations  Peripheral neuropathy Possibly related to vitamin B12 deficiency. Folate is normal. Patient started on vitamin B12 supplementation -Continue Vitamin B12  Progressive weakness Deconditioning but could also be related to ongoing bacteremia. PT/OT recommending SNF on discharge.  Prostate cancer Patient follows with urology. During this admission, sclerotic focus noted on left second rib and is nonspecific. May need to discuss with IR prior to discharge.  Atrial fibrillation with slow ventricular response Stable currently. Transthoracic Echocardiogram ordered and is significant for mildly dilated left atrium with  normal LVEF.  Pressure injury Left heel, right buttocks.  Both present on admission.   DVT prophylaxis: Lovenox Code Status:   Code Status: Full Code Family Communication: Son and daughter-in-law at bedside Disposition Plan: Discharge home likely in several days pending continued management/workup for bacteremia and foot ulcer   Consultants:  Infectious disease  Procedures:  5/9: Transthoracic echocardiogram  Antimicrobials: Vancomycin Cefepime Flagyl   Subjective: No concerns today except needing to have a bowel movement.   Objective: BP (!) 161/79 (BP Location: Left Arm)   Pulse (!) 57   Temp 98.4 F (36.9 C) (Oral)   Resp 17   Ht 5\' 7"  (1.702 m)   Wt 82.1 kg   SpO2 100%   BMI 28.35 kg/m   Examination:  General exam: Appears calm and comfortable Respiratory system: Clear to auscultation. Respiratory effort normal. Cardiovascular system: S1 & S2 heard, RRR. Gastrointestinal system: Abdomen is nondistended, soft and nontender. Normal bowel sounds heard. Central nervous system: Alert and oriented. Musculoskeletal: No edema. No calf tenderness Psychiatry: Judgement and insight appear normal. Mood & affect appropriate.    Data Reviewed: I have personally reviewed following labs and imaging studies  CBC Lab Results  Component Value Date   WBC 12.7 (H) 08/27/2022   RBC 2.83 (L) 08/27/2022   HGB 9.5 (L) 08/27/2022   HCT 29.2 (L) 08/27/2022   MCV 103.2 (H) 08/27/2022   MCH 33.6 08/27/2022   PLT 245 08/27/2022   MCHC 32.5 08/27/2022   RDW 14.7 08/27/2022   LYMPHSABS 2.2 08/24/2022   MONOABS 0.4 08/24/2022   EOSABS 0.1 08/24/2022   BASOSABS 0.1 08/24/2022     Last metabolic panel Lab Results  Component Value Date   NA 135 08/27/2022   K 3.6 08/27/2022   CL 103 08/27/2022   CO2 21 (L) 08/27/2022  BUN 27 (H) 08/27/2022   CREATININE 1.09 08/27/2022   GLUCOSE 103 (H) 08/27/2022   GFRNONAA >60 08/27/2022   GFRAA 55 (L) 07/24/2019   CALCIUM 8.6 (L)  08/27/2022   PHOS 3.3 08/25/2022   PROT 5.9 (L) 08/27/2022   ALBUMIN 2.6 (L) 08/27/2022   BILITOT 0.8 08/27/2022   ALKPHOS 46 08/27/2022   AST 33 08/27/2022   ALT 18 08/27/2022   ANIONGAP 11 08/27/2022    GFR: Estimated Creatinine Clearance: 44.3 mL/min (by C-G formula based on SCr of 1.09 mg/dL).  Recent Results (from the past 240 hour(s))  Urine Culture (for pregnant, neutropenic or urologic patients or patients with an indwelling urinary catheter)     Status: Abnormal   Collection Time: 08/24/22  4:50 PM   Specimen: Urine, Clean Catch  Result Value Ref Range Status   Specimen Description   Final    URINE, CLEAN CATCH Performed at [facility], [address] [place], [place] [address]    Special Requests   Final    NONE Performed at [facility], [address], [place], [place] [address]    Culture (A)  Final    >=100,000 COLONIES/mL AEROCOCCUS SPECIES Standardized susceptibility testing for this organism is not available. Performed at [facility], [address], [place], [place] [address]    Report Status 08/27/2022 FINAL  Final  Resp panel by RT-PCR (RSV, Flu A&B, Covid) Anterior Nasal Swab     Status: None   Collection Time: 08/24/22  9:55 PM   Specimen: Anterior Nasal Swab  Result Value Ref Range Status   SARS Coronavirus 2 by RT PCR NEGATIVE NEGATIVE Final    Comment: (NOTE) SARS-CoV-2 target nucleic acids are NOT DETECTED.  The SARS-CoV-2 RNA is generally detectable in upper respiratory specimens during the acute phase of infection. The lowest concentration of SARS-CoV-2 viral copies this assay can detect is 138 copies/mL. A negative result does not preclude SARS-Cov-2 infection and should not be used as the sole basis for treatment or other patient management decisions. A negative result may occur with  improper specimen collection/handling, submission of specimen other than nasopharyngeal swab, presence of viral mutation(s) within the areas targeted  by this assay, and inadequate number of viral copies(<138 copies/mL). A negative result must be combined with clinical observations, patient history, and epidemiological information. The expected result is Negative.  Fact Sheet for Patients:  BloggerCourse.com  Fact Sheet for Healthcare Providers:  SeriousBroker.it  This test is no t yet approved or cleared by the [place] FDA and  has been authorized for detection and/or diagnosis of SARS-CoV-2 by FDA under an Emergency Use Authorization (EUA). This EUA will remain  in effect (meaning this test can be used) for the duration of the COVID-19 declaration under Section 564(b)(1) of the Act, 21 U.S.C.section 360bbb-3(b)(1), unless the authorization is terminated  or revoked sooner.       Influenza A by PCR NEGATIVE NEGATIVE Final   Influenza B by PCR NEGATIVE NEGATIVE Final    Comment: (NOTE) The Xpert Xpress SARS-CoV-2/FLU/RSV plus assay is intended as an aid in the diagnosis of influenza from Nasopharyngeal swab specimens and should not be used as a sole basis for treatment. Nasal washings and aspirates are unacceptable for Xpert Xpress SARS-CoV-2/FLU/RSV testing.  Fact Sheet for Patients: BloggerCourse.com  Fact Sheet for Healthcare Providers: SeriousBroker.it  This test is not yet approved or cleared by the [place] FDA and has been authorized for detection and/or diagnosis of SARS-CoV-2 by  FDA under an Emergency Use Authorization (EUA). This EUA will remain in effect (meaning this test can be used) for the duration of the COVID-19 declaration under Section 564(b)(1) of the Act, 21 U.S.C. section 360bbb-3(b)(1), unless the authorization is terminated or revoked.     Resp Syncytial Virus by PCR NEGATIVE NEGATIVE Final    Comment: (NOTE) Fact Sheet for Patients: BloggerCourse.com  Fact  Sheet for Healthcare Providers: SeriousBroker.it  This test is not yet approved or cleared by the [place] FDA and has been authorized for detection and/or diagnosis of SARS-CoV-2 by FDA under an Emergency Use Authorization (EUA). This EUA will remain in effect (meaning this test can be used) for the duration of the COVID-19 declaration under Section 564(b)(1) of the Act, 21 U.S.C. section 360bbb-3(b)(1), unless the authorization is terminated or revoked.  Performed at [facility], [address] [place], [place] [address]   Blood Culture (routine x 2)     Status: None (Preliminary result)   Collection Time: 08/24/22 10:01 PM   Specimen: BLOOD  Result Value Ref Range Status   Specimen Description   Final    BLOOD BLOOD LEFT HAND Performed at [facility], [address] [place], [place] [address]    Special Requests   Final    BOTTLES DRAWN AEROBIC AND ANAEROBIC Blood Culture adequate volume Performed at [facility], [address], [place], [place] [address]    Culture  Setup Time   Final    GRAM POSITIVE COCCI IN BOTH AEROBIC AND ANAEROBIC BOTTLES Gram Stain Report Called to,Read Back By and Verified With: PIPKIN,C ON 08/25/22 AT 2110 BY LOY,C CRITICAL RESULT CALLED TO, READ BACK BY AND VERIFIED WITH: C PIPKINS,RN@[id number]  11/26/22 [facility] Performed at [facility], [address], [place], [place] [address]    Culture   Final    GRAM POSITIVE COCCI IDENTIFICATION TO FOLLOW REPEATING Performed at [facility], [address], [place], [place] [address]    Report Status PENDING  Incomplete  Blood Culture ID Panel (Reflexed)     Status: None   Collection Time: 08/24/22 10:01 PM  Result Value Ref Range Status   Enterococcus faecalis NOT DETECTED NOT DETECTED Final   Enterococcus Faecium NOT DETECTED NOT DETECTED Final   Listeria monocytogenes NOT DETECTED NOT DETECTED Final   Staphylococcus species NOT DETECTED NOT DETECTED Final   Staphylococcus  aureus (BCID) NOT DETECTED NOT DETECTED Final   Staphylococcus epidermidis NOT DETECTED NOT DETECTED Final   Staphylococcus lugdunensis NOT DETECTED NOT DETECTED Final   Streptococcus species NOT DETECTED NOT DETECTED Final   Streptococcus agalactiae NOT DETECTED NOT DETECTED Final   Streptococcus pneumoniae NOT DETECTED NOT DETECTED Final   Streptococcus pyogenes NOT DETECTED NOT DETECTED Final   A.calcoaceticus-baumannii NOT DETECTED NOT DETECTED Final   Bacteroides fragilis NOT DETECTED NOT DETECTED Final   Enterobacterales NOT DETECTED NOT DETECTED Final   Enterobacter cloacae complex NOT DETECTED NOT DETECTED Final   Escherichia coli NOT DETECTED NOT DETECTED Final   Klebsiella aerogenes NOT DETECTED NOT DETECTED Final   Klebsiella oxytoca NOT DETECTED NOT DETECTED Final   Klebsiella pneumoniae NOT DETECTED NOT DETECTED Final   Proteus species NOT DETECTED NOT DETECTED Final   Salmonella species NOT DETECTED NOT DETECTED Final   Serratia marcescens NOT DETECTED NOT DETECTED Final   Haemophilus influenzae NOT DETECTED NOT DETECTED Final   Neisseria meningitidis NOT DETECTED NOT DETECTED Final   Pseudomonas aeruginosa NOT DETECTED NOT DETECTED Final   Stenotrophomonas maltophilia NOT DETECTED  NOT DETECTED Final   Candida albicans NOT DETECTED NOT DETECTED Final   Candida auris NOT DETECTED NOT DETECTED Final   Candida glabrata NOT DETECTED NOT DETECTED Final   Candida krusei NOT DETECTED NOT DETECTED Final   Candida parapsilosis NOT DETECTED NOT DETECTED Final   Candida tropicalis NOT DETECTED NOT DETECTED Final   Cryptococcus neoformans/gattii NOT DETECTED NOT DETECTED Final    Comment: Performed at [facility], [address] [place], [place] [address]  Blood Culture (routine x 2)     Status: Abnormal   Collection Time: 08/24/22 10:02 PM   Specimen: BLOOD  Result Value Ref Range Status   Specimen Description   Final    BLOOD BLOOD LEFT WRIST Performed at [facility], [address], [place], [place] [address]    Special Requests   Final    BOTTLES DRAWN AEROBIC AND ANAEROBIC Blood Culture adequate volume Performed at [facility], [address] [place], [place] [address]    Culture  Setup Time   Final    GRAM POSITIVE COCCI AEROBIC BOTTLE ONLY CRITICAL VALUE NOTED.  VALUE IS CONSISTENT WITH PREVIOUSLY REPORTED AND CALLED VALUE. GRAM POSITIVE RODS ANAEROBIC BOTTLE ONLY Gram Stain Report Called to,Read Back By and Verified With: MADISON,GINGER @ [phone] ON 08/26/2022 BY FRATTO,ASHLEY Performed at [facility], [address], [place], [place] [address]    Culture (A)  Final    AEROCOCCUS URINAE GRAM POSITIVE RODS Actinotignum schaalii  Standardized susceptibility testing for this organism is not available. Performed at [facility], [address], [place], [place] [address]    Report Status 08/28/2022 FINAL  Final  Culture, blood (Routine X 2) w Reflex to ID Panel     Status: None (Preliminary result)   Collection Time: 08/26/22 11:50 AM   Specimen: BLOOD RIGHT HAND  Result Value Ref Range Status   Specimen Description BLOOD RIGHT HAND  Final   Special Requests   Final    BOTTLES DRAWN AEROBIC AND ANAEROBIC Blood Culture results may not be optimal due to an excessive volume of blood received in culture bottles   Culture   Final    NO GROWTH < 24 HOURS Performed at [facility], [address], [place], [place] [address]    Report Status PENDING  Incomplete  Culture, blood (Routine X 2) w Reflex to ID Panel     Status: None (Preliminary result)   Collection Time: 08/26/22 11:50 AM   Specimen: Right Antecubital; Blood  Result Value Ref Range Status   Specimen Description RIGHT ANTECUBITAL  Final   Special Requests   Final    BOTTLES DRAWN AEROBIC AND ANAEROBIC Blood Culture adequate volume   Culture   Final    NO GROWTH < 24 HOURS Performed at [facility], [address] [place], [place] [address]    Report Status PENDING   Incomplete      Radiology Studies: CT FOOT RIGHT W CONTRAST  Result Date: 08/27/2022 CLINICAL DATA:  Foot swelling, diabetic, osteomyelitis suspected, xray done EXAM: CT OF THE LOWER RIGHT EXTREMITY WITH CONTRAST TECHNIQUE: Multidetector CT imaging of the lower right extremity was performed according to the standard protocol following intravenous contrast administration. RADIATION DOSE REDUCTION: This exam was performed according to the departmental dose-optimization program which includes automated exposure control, adjustment of the mA and/or kV according to patient size and/or use of iterative reconstruction technique. CONTRAST:  75mL OMNIPAQUE IOHEXOL 350 MG/ML SOLN COMPARISON:  Right foot radiograph 08/24/2022. FINDINGS:  Bones/Joint/Cartilage There is no evidence of acute fracture. There are mild-to-moderate degenerative changes in the midfoot and forefoot. There is mild cortical thinning and sclerotic change of the fifth metatarsal head but no definite bone erosion. Diffuse osteopenia. Chronic posttraumatic deformity of the distal tibia and fibula. Ligaments Suboptimally assessed by CT. Muscles and Tendons Diffuse muscle atrophy compatible with chronic denervation. Soft tissues Lateral midfoot and forefoot soft tissue ulcers. The lateral forefoot ulcer extends to the surface of the fifth metatarsal head. There is adjacent soft tissue swelling thickening but no evidence of a well-defined/drainable fluid collection IMPRESSION: Lateral forefoot soft tissue ulcers with underlying cortical thinning and mild sclerotic change in the fifth metatarsal head, suspicious for osteomyelitis. MRI would be more definitive. Soft tissue swelling and thickening but no evidence of an organized soft tissue abscess. Electronically Signed   By: Caprice Renshaw M.D.   On: 08/27/2022 20:42      LOS: 3 days    Jacquelin Hawking, MD [facility] 08/28/2022, 1:28 PM   If 7PM-7AM, please contact  night-coverage [email or web address]

## 2022-08-29 DIAGNOSIS — L97514 Non-pressure chronic ulcer of other part of right foot with necrosis of bone: Secondary | ICD-10-CM | POA: Diagnosis not present

## 2022-08-29 DIAGNOSIS — A499 Bacterial infection, unspecified: Secondary | ICD-10-CM | POA: Diagnosis not present

## 2022-08-29 DIAGNOSIS — I480 Paroxysmal atrial fibrillation: Secondary | ICD-10-CM | POA: Diagnosis not present

## 2022-08-29 DIAGNOSIS — J40 Bronchitis, not specified as acute or chronic: Secondary | ICD-10-CM | POA: Diagnosis not present

## 2022-08-29 LAB — CBC
HCT: 26.4 % — ABNORMAL LOW (ref 39.0–52.0)
Hemoglobin: 8.7 g/dL — ABNORMAL LOW (ref 13.0–17.0)
MCH: 33.6 pg (ref 26.0–34.0)
MCHC: 33 g/dL (ref 30.0–36.0)
MCV: 101.9 fL — ABNORMAL HIGH (ref 80.0–100.0)
Platelets: 241 10*3/uL (ref 150–400)
RBC: 2.59 MIL/uL — ABNORMAL LOW (ref 4.22–5.81)
RDW: 14.4 % (ref 11.5–15.5)
WBC: 8.1 10*3/uL (ref 4.0–10.5)
nRBC: 0 % (ref 0.0–0.2)

## 2022-08-29 LAB — BASIC METABOLIC PANEL
Anion gap: 10 (ref 5–15)
BUN: 21 mg/dL (ref 8–23)
CO2: 25 mmol/L (ref 22–32)
Calcium: 8.6 mg/dL — ABNORMAL LOW (ref 8.9–10.3)
Chloride: 99 mmol/L (ref 98–111)
Creatinine, Ser: 0.99 mg/dL (ref 0.61–1.24)
GFR, Estimated: >60 mL/min (ref >60)
Glucose, Bld: 119 mg/dL — ABNORMAL HIGH (ref 70–99)
Potassium: 4 mmol/L (ref 3.5–5.1)
Sodium: 134 mmol/L — ABNORMAL LOW (ref 135–145)

## 2022-08-29 LAB — CULTURE, BLOOD (ROUTINE X 2)
Culture: NO GROWTH
Special Requests: ADEQUATE
Special Requests: ADEQUATE

## 2022-08-29 LAB — PROCALCITONIN: Procalcitonin: 2.71 ng/mL

## 2022-08-29 LAB — VAS US ABI WITH/WO TBI
Left ABI: 1.11
Right ABI: 0.65

## 2022-08-29 NOTE — Plan of Care (Signed)
  Problem: Clinical Measurements: Goal: Will remain free from infection Outcome: Progressing Goal: Respiratory complications will improve Outcome: Progressing   Problem: Nutrition: Goal: Adequate nutrition will be maintained Outcome: Progressing   Problem: Coping: Goal: Level of anxiety will decrease Outcome: Progressing   Problem: Pain Managment: Goal: General experience of comfort will improve Outcome: Progressing   

## 2022-08-29 NOTE — Progress Notes (Addendum)
PROGRESS NOTE    Jesse VANWAGNER Sr.  VWU:[id number] DOB: [birth year] DOA: 08/24/2022 PCP: Elfredia Nevins, MD   Brief Narrative: Jesse Pollock Sr. is a 89 y.o. male with a history of hypertension, BPH, prostate cancer.  Patient presented secondary to generalized weakness with evidence of sepsis on admission.  Patient was started on empiric antibiotics for found to have gram-positive cocci bacteremia.  Patient also noted to have a right foot ulcer concerning for possible osteomyelitis.  Infectious disease consulted.   Assessment and Plan:  Sepsis Present on admission. Secondary to gram positive bacteremia. Patient was started empirically on Vancomycin, Cefepime and Flagyl and narrowed to Vancomycin and Flagyl.  Leukocytosis is improving from 19,800 on admission down to 12,700.  Aerococcus bacteremia Actinotignum schaalii bacteremia Concern this could be related to possible osteomyelitis, however urine culture shows similar organism and is the likely source. Patient also found to have gram positive rods. -ID recommendations: Continue Vancomycin and Flagyl  Right foot ulcer Patient with peripheral neuropathy, which likely contributed to development and worsening. MRI attempted by failed. CT foot obtained and is suggestive of possible osteomyelitis but inconclusive.  Peripheral neuropathy Possibly related to vitamin B12 deficiency. Folate is normal. Patient started on vitamin B12 supplementation -Continue Vitamin B12  Progressive weakness Deconditioning but could also be related to ongoing bacteremia. PT/OT recommending SNF on discharge.  Prostate cancer Patient follows with urology. During this admission, sclerotic focus noted on left second rib and is nonspecific. May need to discuss with IR prior to discharge.  Atrial fibrillation with slow ventricular response Stable currently. Transthoracic Echocardiogram ordered and is significant for mildly dilated left atrium with normal  LVEF.  Pressure injury Left heel, right buttocks.  Both present on admission.   DVT prophylaxis: Lovenox Code Status:   Code Status: Full Code Family Communication: None at bedside Disposition Plan: Discharge home likely in several days pending continued management/workup for bacteremia and foot ulcer   Consultants:  Infectious disease  Procedures:  5/9: Transthoracic echocardiogram  Antimicrobials: Vancomycin Cefepime Flagyl   Subjective: Patient without specific concerns today. Happy to hear that he is on good treatment for his infection.   Objective: BP (!) 140/66 (BP Location: Right Arm)   Pulse 63   Temp 98.5 F (36.9 C) (Oral)   Resp 16   Ht 5\' 7"  (1.702 m)   Wt 82.1 kg   SpO2 100%   BMI 28.35 kg/m   Examination:  General exam: Appears calm and comfortable Respiratory system: Clear to auscultation. Cardiovascular system: S1 & S2 heard, RRR Gastrointestinal system: Abdomen is nondistended, soft and nontender. Normal bowel sounds heard. Central nervous system: Alert. Musculoskeletal: No edema. No calf tenderness   Data Reviewed: I have personally reviewed following labs and imaging studies  CBC Lab Results  Component Value Date   WBC 8.1 08/29/2022   RBC 2.59 (L) 08/29/2022   HGB 8.7 (L) 08/29/2022   HCT 26.4 (L) 08/29/2022   MCV 101.9 (H) 08/29/2022   MCH 33.6 08/29/2022   PLT 241 08/29/2022   MCHC 33.0 08/29/2022   RDW 14.4 08/29/2022   LYMPHSABS 2.2 08/24/2022   MONOABS 0.4 08/24/2022   EOSABS 0.1 08/24/2022   BASOSABS 0.1 08/24/2022     Last metabolic panel Lab Results  Component Value Date   NA 134 (L) 08/29/2022   K 4.0 08/29/2022   CL 99 08/29/2022   CO2 25 08/29/2022   BUN 21 08/29/2022   CREATININE 0.99 08/29/2022   GLUCOSE 119 (H)  08/29/2022   GFRNONAA >60 08/29/2022   GFRAA 55 (L) 07/24/2019   CALCIUM 8.6 (L) 08/29/2022   PHOS 3.3 08/25/2022   PROT 5.9 (L) 08/27/2022   ALBUMIN 2.6 (L) 08/27/2022   BILITOT 0.8  08/27/2022   ALKPHOS 46 08/27/2022   AST 33 08/27/2022   ALT 18 08/27/2022   ANIONGAP 10 08/29/2022    GFR: Estimated Creatinine Clearance: 48.8 mL/min (by C-G formula based on SCr of 0.99 mg/dL).  Recent Results (from the past 240 hour(s))  Urine Culture (for pregnant, neutropenic or urologic patients or patients with an indwelling urinary catheter)     Status: Abnormal   Collection Time: 08/24/22  4:50 PM   Specimen: Urine, Clean Catch  Result Value Ref Range Status   Specimen Description   Final    URINE, CLEAN CATCH Performed at [facility], [address], [place], [place] [address]    Special Requests   Final    NONE Performed at [facility], [address], [place], [place] [address]    Culture (A)  Final    >=100,000 COLONIES/mL AEROCOCCUS SPECIES Standardized susceptibility testing for this organism is not available. Performed at [facility], [address] [place], [place] [address]    Report Status 08/27/2022 FINAL  Final  Resp panel by RT-PCR (RSV, Flu A&B, Covid) Anterior Nasal Swab     Status: None   Collection Time: 08/24/22  9:55 PM   Specimen: Anterior Nasal Swab  Result Value Ref Range Status   SARS Coronavirus 2 by RT PCR NEGATIVE NEGATIVE Final    Comment: (NOTE) SARS-CoV-2 target nucleic acids are NOT DETECTED.  The SARS-CoV-2 RNA is generally detectable in upper respiratory specimens during the acute phase of infection. The lowest concentration of SARS-CoV-2 viral copies this assay can detect is 138 copies/mL. A negative result does not preclude SARS-Cov-2 infection and should not be used as the sole basis for treatment or other patient management decisions. A negative result may occur with  improper specimen collection/handling, submission of specimen other than nasopharyngeal swab, presence of viral mutation(s) within the areas targeted by this assay, and inadequate number of viral copies(<138 copies/mL). A negative result must be combined  with clinical observations, patient history, and epidemiological information. The expected result is Negative.  Fact Sheet for Patients:  BloggerCourse.com  Fact Sheet for Healthcare Providers:  SeriousBroker.it  This test is no t yet approved or cleared by the [place] FDA and  has been authorized for detection and/or diagnosis of SARS-CoV-2 by FDA under an Emergency Use Authorization (EUA). This EUA will remain  in effect (meaning this test can be used) for the duration of the COVID-19 declaration under Section 564(b)(1) of the Act, 21 U.S.C.section 360bbb-3(b)(1), unless the authorization is terminated  or revoked sooner.       Influenza A by PCR NEGATIVE NEGATIVE Final   Influenza B by PCR NEGATIVE NEGATIVE Final    Comment: (NOTE) The Xpert Xpress SARS-CoV-2/FLU/RSV plus assay is intended as an aid in the diagnosis of influenza from Nasopharyngeal swab specimens and should not be used as a sole basis for treatment. Nasal washings and aspirates are unacceptable for Xpert Xpress SARS-CoV-2/FLU/RSV testing.  Fact Sheet for Patients: BloggerCourse.com  Fact Sheet for Healthcare Providers: SeriousBroker.it  This test is not yet approved or cleared by the [place] FDA and has been authorized for detection and/or diagnosis of SARS-CoV-2 by FDA under an Emergency Use Authorization (EUA). This EUA will remain in effect (meaning  this test can be used) for the duration of the COVID-19 declaration under Section 564(b)(1) of the Act, 21 U.S.C. section 360bbb-3(b)(1), unless the authorization is terminated or revoked.     Resp Syncytial Virus by PCR NEGATIVE NEGATIVE Final    Comment: (NOTE) Fact Sheet for Patients: BloggerCourse.com  Fact Sheet for Healthcare Providers: SeriousBroker.it  This test is not yet approved  or cleared by the [place] FDA and has been authorized for detection and/or diagnosis of SARS-CoV-2 by FDA under an Emergency Use Authorization (EUA). This EUA will remain in effect (meaning this test can be used) for the duration of the COVID-19 declaration under Section 564(b)(1) of the Act, 21 U.S.C. section 360bbb-3(b)(1), unless the authorization is terminated or revoked.  Performed at [facility], [address] [place], [place] [address]   Blood Culture (routine x 2)     Status: None (Preliminary result)   Collection Time: 08/24/22 10:01 PM   Specimen: BLOOD  Result Value Ref Range Status   Specimen Description   Final    BLOOD BLOOD LEFT HAND Performed at [facility], [address] [place], [place] [address]    Special Requests   Final    BOTTLES DRAWN AEROBIC AND ANAEROBIC Blood Culture adequate volume Performed at [facility], [address] [place], [place] [address]    Culture  Setup Time   Final    GRAM POSITIVE COCCI IN BOTH AEROBIC AND ANAEROBIC BOTTLES Gram Stain Report Called to,Read Back By and Verified With: PIPKIN,C ON 08/25/22 AT 2110 BY LOY,C CRITICAL RESULT CALLED TO, READ BACK BY AND VERIFIED WITH: C PIPKINS,RN@0155  11/26/22 [facility] Performed at [facility], [address], [place], [place] [address]    Culture   Final    GRAM POSITIVE COCCI IDENTIFICATION TO FOLLOW REPEATING Performed at [facility], [address] [place], [place] [address]    Report Status PENDING  Incomplete  Blood Culture ID Panel (Reflexed)     Status: None   Collection Time: 08/24/22 10:01 PM  Result Value Ref Range Status   Enterococcus faecalis NOT DETECTED NOT DETECTED Final   Enterococcus Faecium NOT DETECTED NOT DETECTED Final   Listeria monocytogenes NOT DETECTED NOT DETECTED Final   Staphylococcus species NOT DETECTED NOT DETECTED Final   Staphylococcus aureus (BCID) NOT DETECTED NOT DETECTED Final   Staphylococcus epidermidis NOT DETECTED NOT DETECTED Final    Staphylococcus lugdunensis NOT DETECTED NOT DETECTED Final   Streptococcus species NOT DETECTED NOT DETECTED Final   Streptococcus agalactiae NOT DETECTED NOT DETECTED Final   Streptococcus pneumoniae NOT DETECTED NOT DETECTED Final   Streptococcus pyogenes NOT DETECTED NOT DETECTED Final   A.calcoaceticus-baumannii NOT DETECTED NOT DETECTED Final   Bacteroides fragilis NOT DETECTED NOT DETECTED Final   Enterobacterales NOT DETECTED NOT DETECTED Final   Enterobacter cloacae complex NOT DETECTED NOT DETECTED Final   Escherichia coli NOT DETECTED NOT DETECTED Final   Klebsiella aerogenes NOT DETECTED NOT DETECTED Final   Klebsiella oxytoca NOT DETECTED NOT DETECTED Final   Klebsiella pneumoniae NOT DETECTED NOT DETECTED Final   Proteus species NOT DETECTED NOT DETECTED Final   Salmonella species NOT DETECTED NOT DETECTED Final   Serratia marcescens NOT DETECTED NOT DETECTED Final   Haemophilus influenzae NOT DETECTED NOT DETECTED Final   Neisseria meningitidis NOT DETECTED NOT DETECTED Final   Pseudomonas aeruginosa NOT DETECTED NOT DETECTED Final   Stenotrophomonas maltophilia NOT DETECTED NOT DETECTED Final   Candida albicans NOT DETECTED NOT DETECTED Final  Candida auris NOT DETECTED NOT DETECTED Final   Candida glabrata NOT DETECTED NOT DETECTED Final   Candida krusei NOT DETECTED NOT DETECTED Final   Candida parapsilosis NOT DETECTED NOT DETECTED Final   Candida tropicalis NOT DETECTED NOT DETECTED Final   Cryptococcus neoformans/gattii NOT DETECTED NOT DETECTED Final    Comment: Performed at [facility], [address], [place], [place] [address]  Blood Culture (routine x 2)     Status: Abnormal   Collection Time: 08/24/22 10:02 PM   Specimen: BLOOD  Result Value Ref Range Status   Specimen Description   Final    BLOOD BLOOD LEFT WRIST Performed at [facility], [address], [place], [place] [address]    Special Requests   Final    BOTTLES DRAWN AEROBIC AND  ANAEROBIC Blood Culture adequate volume Performed at [facility], [address] [place], [place] [address]    Culture  Setup Time   Final    GRAM POSITIVE COCCI AEROBIC BOTTLE ONLY CRITICAL VALUE NOTED.  VALUE IS CONSISTENT WITH PREVIOUSLY REPORTED AND CALLED VALUE. GRAM POSITIVE RODS ANAEROBIC BOTTLE ONLY Gram Stain Report Called to,Read Back By and Verified With: MADISON,GINGER @ [phone] ON 08/26/2022 BY FRATTO,ASHLEY Performed at [facility], [address], [place], [place] [address]    Culture (A)  Final    AEROCOCCUS URINAE GRAM POSITIVE RODS Actinotignum schaalii  Standardized susceptibility testing for this organism is not available. Performed at [facility], [address], [place], [place] [address]    Report Status 08/28/2022 FINAL  Final  Culture, blood (Routine X 2) w Reflex to ID Panel     Status: None (Preliminary result)   Collection Time: 08/26/22 11:50 AM   Specimen: BLOOD RIGHT HAND  Result Value Ref Range Status   Specimen Description   Final    BLOOD RIGHT HAND Performed at [facility], [address], [place], [place] [address]    Special Requests   Final    BOTTLES DRAWN AEROBIC AND ANAEROBIC Blood Culture results may not be optimal due to an excessive volume of blood received in culture bottles Performed at [facility], [address], [place], [place] [address]    Culture   Final    NO GROWTH 2 DAYS Performed at [facility], [address], [place], [place] [address]    Report Status PENDING  Incomplete  Culture, blood (Routine X 2) w Reflex to ID Panel     Status: None (Preliminary result)   Collection Time: 08/26/22 11:50 AM   Specimen: Right Antecubital; Blood  Result Value Ref Range Status   Specimen Description   Final    RIGHT ANTECUBITAL Performed at [facility], [address] [place], [place] [address]    Special Requests   Final    BOTTLES DRAWN AEROBIC AND ANAEROBIC Blood Culture adequate volume Performed at [facility], [address], [place], [place] [address]    Culture   Final    NO GROWTH 2 DAYS Performed at [facility], [address], [place], [place] [address]    Report Status PENDING  Incomplete      Radiology Studies: VAS [place] ABI WITH/WO TBI  Result Date: 08/29/2022  LOWER EXTREMITY DOPPLER STUDY Patient Name:  Jesse LOEHRER Daigneault SR.  Date of Exam:   08/28/2022 Medical Rec #: [id number]         Accession #:    [id number] Date of Birth: 08-02-1928  Patient Gender: M Patient Age:   37 years Exam Location:  [facility] Procedure:      VAS [place] ABI WITH/WO TBI Referring Phys: Lehman Whiteley --------------------------------------------------------------------------------  Indications: Ulceration. Other Factors: Osteomyelitis, paroxysmal atrial fibrillation.  Limitations: Today's exam was limited due to atrial fibrillation, bandages. Comparison Study: No prior study on file Performing Technologist: Sherren Kerns RVS  Examination Guidelines: A complete evaluation includes at minimum, Doppler waveform signals and systolic blood pressure reading at the level of bilateral brachial, anterior tibial, and posterior tibial arteries, when vessel segments are accessible. Bilateral testing is considered an integral part of a complete examination. Photoelectric Plethysmograph (PPG) waveforms and toe systolic pressure readings are included as required and additional duplex testing as needed. Limited examinations for reoccurring indications may be performed as noted.  ABI Findings: +---------+------------------+-----+-----------+--------+ Right    Rt Pressure (mmHg)IndexWaveform   Comment  +---------+------------------+-----+-----------+--------+ Brachial 150                    multiphasic         +---------+------------------+-----+-----------+--------+ PTA      105               0.62 monophasic          +---------+------------------+-----+-----------+--------+ DP       109               0.65  monophasic          +---------+------------------+-----+-----------+--------+ Great Toe45                0.27 Abnormal            +---------+------------------+-----+-----------+--------+ +---------+------------------+-----+-----------+-------+ Left     Lt Pressure (mmHg)IndexWaveform   Comment +---------+------------------+-----+-----------+-------+ Brachial 168                    multiphasic        +---------+------------------+-----+-----------+-------+ PTA      186               1.11 multiphasic        +---------+------------------+-----+-----------+-------+ DP       185               1.10 multiphasic        +---------+------------------+-----+-----------+-------+ Great Toe102               0.61 Abnormal           +---------+------------------+-----+-----------+-------+ +-------+-----------+-----------+------------+------------+ ABI/TBIToday's ABIToday's TBIPrevious ABIPrevious TBI +-------+-----------+-----------+------------+------------+ Right  0.65       0.27                                +-------+-----------+-----------+------------+------------+ Left   1.11       0.61                                +-------+-----------+-----------+------------+------------+  Summary: Right: Resting right ankle-brachial index indicates moderate right lower extremity arterial disease. The right toe-brachial index is abnormal. Left: Resting left ankle-brachial index is within normal range. The left toe-brachial index is abnormal. *See table(s) above for measurements and observations.  Electronically signed by Lemar Livings MD on 08/29/2022 at 10:26:35 AM.    Final       LOS: 4 days    Jacquelin Hawking, MD [facility] 08/29/2022, 2:55 PM   If 7PM-7AM, please contact night-coverage [email or web address]

## 2022-08-29 NOTE — Plan of Care (Signed)
  Problem: Education: Goal: Knowledge of General Education information will improve Description: Including pain rating scale, medication(s)/side effects and non-pharmacologic comfort measures Outcome: Progressing   Problem: Clinical Measurements: Goal: Ability to maintain clinical measurements within normal limits will improve Outcome: Progressing   

## 2022-08-30 ENCOUNTER — Inpatient Hospital Stay (HOSPITAL_COMMUNITY): Payer: Medicare Other

## 2022-08-30 DIAGNOSIS — J90 Pleural effusion, not elsewhere classified: Secondary | ICD-10-CM | POA: Diagnosis not present

## 2022-08-30 DIAGNOSIS — L97514 Non-pressure chronic ulcer of other part of right foot with necrosis of bone: Secondary | ICD-10-CM | POA: Diagnosis not present

## 2022-08-30 DIAGNOSIS — A499 Bacterial infection, unspecified: Secondary | ICD-10-CM | POA: Diagnosis not present

## 2022-08-30 DIAGNOSIS — I480 Paroxysmal atrial fibrillation: Secondary | ICD-10-CM | POA: Diagnosis not present

## 2022-08-30 LAB — CULTURE, BLOOD (ROUTINE X 2)

## 2022-08-30 MED ORDER — AMOXICILLIN 500 MG PO CAPS
500.0000 mg | ORAL_CAPSULE | Freq: Once | ORAL | Status: AC
  Administered 2022-08-30: 500 mg via ORAL
  Filled 2022-08-30: qty 1

## 2022-08-30 MED ORDER — SODIUM CHLORIDE 0.9 % IV SOLN
2.0000 g | INTRAVENOUS | Status: DC
  Administered 2022-08-30: 2 g via INTRAVENOUS
  Filled 2022-08-30: qty 20

## 2022-08-30 MED ORDER — GADOBUTROL 1 MMOL/ML IV SOLN
10.0000 mL | Freq: Once | INTRAVENOUS | Status: AC | PRN
  Administered 2022-08-30: 10 mL via INTRAVENOUS

## 2022-08-30 MED ORDER — AMOXICILLIN 500 MG PO CAPS
500.0000 mg | ORAL_CAPSULE | Freq: Once | ORAL | Status: DC
  Filled 2022-08-30: qty 1

## 2022-08-30 MED ORDER — DIPHENHYDRAMINE HCL 50 MG/ML IJ SOLN: 25.0000 mg | Freq: Once | INTRAMUSCULAR | Status: DC | PRN

## 2022-08-30 MED ORDER — EPINEPHRINE 0.3 MG/0.3ML IJ SOAJ: 0.3000 mg | Freq: Once | INTRAMUSCULAR | Status: DC | PRN

## 2022-08-30 NOTE — Progress Notes (Signed)
Pharmacy Note- Penicillin Allergy Clarification   ASSESSMENT: 89 year old male with listed penicillin allergy. Patient states that the allergy occurred over 20-30 years ago. He cannot recall the exact reaction but may have been a rash. He does not recall if he was in the hospital for the reaction or not. He may have been in the hospital for a broken hip per his recollection.    PEN-FAST Scoring   Five years or less since last reaction 0  Anaphylaxis/Angioedema OR Severe cutaneous adverse reaction  0 - unknown   Treatment required for reaction  +1  Total Score 1-2 points - Low risk of positive penicillin allergy test (5%)    Type of intervention (select all that apply):  Amoxicillin Oral Challenge  Impact on therapy (select all that apply):  Penicillin allergy removed and Antibiotic de-escalation   PLAN: - amoxicillin 500 mg PO x1  - Q54min vitals monitoring x 1 hour  - Epi-Pen PRN  - IV diphenhydramine PRN  - plan communicated to primary RN   POST-CHALLENGE FOLLOW-UP:

## 2022-08-30 NOTE — Care Management Important Message (Signed)
Important Message  Patient Details  Name: Jesse WRITER Sr. MRN: [id number] Date of Birth: 05-15-1928   Medicare Important Message Given:  Yes     Sherilyn Banker 08/30/2022, 1:13 PM

## 2022-08-30 NOTE — Progress Notes (Signed)
PROGRESS NOTE    MACLANE LAGRAVE Sr.  ZOX:[id number] DOB: Feb 12, 1929 DOA: 08/24/2022 PCP: Elfredia Nevins, MD   Brief Narrative: Jeffie Pollock Sr. is a 89 y.o. male with a history of hypertension, BPH, prostate cancer.  Patient presented secondary to generalized weakness with evidence of sepsis on admission.  Patient was started on empiric antibiotics for found to have gram-positive cocci bacteremia.  Patient also noted to have a right foot ulcer concerning for possible osteomyelitis.  Infectious disease consulted.   Assessment and Plan:  Sepsis Present on admission. Secondary to gram positive bacteremia. Patient was started empirically on Vancomycin, Cefepime and Flagyl, narrowed to Vancomycin and Flagyl and then to Ceftriaxone.  Leukocytosis is improving from 19,800 on admission down to 8,100.  Aerococcus bacteremia Actinotignum schaalii bacteremia Concern this could be related to possible osteomyelitis, however urine culture shows similar organism and is the likely source. Patient also found to have gram positive rods. -ID recommendations: Discontinued Vancomycin and Flagyl and started Ceftriaxone; repeat MRI attempt  Right foot ulcer Patient with peripheral neuropathy, which likely contributed to development and worsening. MRI attempted by failed. CT foot obtained and is suggestive of possible osteomyelitis but inconclusive. -MRI foot pending  Peripheral neuropathy Possibly related to vitamin B12 deficiency. Folate is normal. Patient started on vitamin B12 supplementation -Continue Vitamin B12  Progressive weakness Deconditioning but could also be related to ongoing bacteremia. PT/OT recommending SNF on discharge.  Prostate cancer Patient follows with urology. During this admission, sclerotic focus noted on left second rib and is nonspecific. May need to discuss with IR prior to discharge.  Atrial fibrillation with slow ventricular response Stable currently. Transthoracic  Echocardiogram ordered and is significant for mildly dilated left atrium with normal LVEF.  Pressure injury Left heel, right buttocks.  Both present on admission.   DVT prophylaxis: Lovenox Code Status:   Code Status: Full Code Family Communication: None at bedside. Called listed number (daughter) but went straight to voicemail Disposition Plan: Discharge home likely in several days pending continued management/workup for bacteremia and foot ulcer   Consultants:  Infectious disease  Procedures:  5/9: Transthoracic echocardiogram  Antimicrobials: Vancomycin Cefepime Flagyl   Subjective: No issues overnight. Wanting to do whatever needs to be done to get him well.  Objective: BP 135/63 (BP Location: Right Arm)   Pulse (!) 58   Temp 98.2 F (36.8 C) (Oral)   Resp 16   Ht 5\' 7"  (1.702 m)   Wt 82.1 kg   SpO2 100%   BMI 28.35 kg/m   Examination:  General exam: Appears calm and comfortable  Respiratory system: Clear to auscultation. Respiratory effort normal. Cardiovascular system: S1 & S2 heard, RRR. No murmurs Gastrointestinal system: Abdomen is nondistended, soft and nontender. No organomegaly or masses felt. Normal bowel sounds heard. Central nervous system: Alert. No focal neurological deficits. Psychiatry: Judgement and insight appear normal. Mood & affect appropriate.    Data Reviewed: I have personally reviewed following labs and imaging studies  CBC Lab Results  Component Value Date   WBC 8.1 08/29/2022   RBC 2.59 (L) 08/29/2022   HGB 8.7 (L) 08/29/2022   HCT 26.4 (L) 08/29/2022   MCV 101.9 (H) 08/29/2022   MCH 33.6 08/29/2022   PLT 241 08/29/2022   MCHC 33.0 08/29/2022   RDW 14.4 08/29/2022   LYMPHSABS 2.2 08/24/2022   MONOABS 0.4 08/24/2022   EOSABS 0.1 08/24/2022   BASOSABS 0.1 08/24/2022     Last metabolic panel Lab Results  Component  Value Date   NA 134 (L) 08/29/2022   K 4.0 08/29/2022   CL 99 08/29/2022   CO2 25 08/29/2022   BUN 21  08/29/2022   CREATININE 0.99 08/29/2022   GLUCOSE 119 (H) 08/29/2022   GFRNONAA >60 08/29/2022   GFRAA 55 (L) 07/24/2019   CALCIUM 8.6 (L) 08/29/2022   PHOS 3.3 08/25/2022   PROT 5.9 (L) 08/27/2022   ALBUMIN 2.6 (L) 08/27/2022   BILITOT 0.8 08/27/2022   ALKPHOS 46 08/27/2022   AST 33 08/27/2022   ALT 18 08/27/2022   ANIONGAP 10 08/29/2022    GFR: Estimated Creatinine Clearance: 48.8 mL/min (by C-G formula based on SCr of 0.99 mg/dL).  Recent Results (from the past 240 hour(s))  Urine Culture (for pregnant, neutropenic or urologic patients or patients with an indwelling urinary catheter)     Status: Abnormal   Collection Time: 08/24/22  4:50 PM   Specimen: Urine, Clean Catch  Result Value Ref Range Status   Specimen Description   Final    URINE, CLEAN CATCH Performed at [facility], [address], [place], [place] [address]    Special Requests   Final    NONE Performed at [facility], [address] [place], [place] [address]    Culture (A)  Final    >=100,000 COLONIES/mL AEROCOCCUS SPECIES Standardized susceptibility testing for this organism is not available. Performed at [facility], [address], [place], [place] [address]    Report Status 08/27/2022 FINAL  Final  Resp panel by RT-PCR (RSV, Flu A&B, Covid) Anterior Nasal Swab     Status: None   Collection Time: 08/24/22  9:55 PM   Specimen: Anterior Nasal Swab  Result Value Ref Range Status   SARS Coronavirus 2 by RT PCR NEGATIVE NEGATIVE Final    Comment: (NOTE) SARS-CoV-2 target nucleic acids are NOT DETECTED.  The SARS-CoV-2 RNA is generally detectable in upper respiratory specimens during the acute phase of infection. The lowest concentration of SARS-CoV-2 viral copies this assay can detect is 138 copies/mL. A negative result does not preclude SARS-Cov-2 infection and should not be used as the sole basis for treatment or other patient management decisions. A negative result may occur with   improper specimen collection/handling, submission of specimen other than nasopharyngeal swab, presence of viral mutation(s) within the areas targeted by this assay, and inadequate number of viral copies(<138 copies/mL). A negative result must be combined with clinical observations, patient history, and epidemiological information. The expected result is Negative.  Fact Sheet for Patients:  BloggerCourse.com  Fact Sheet for Healthcare Providers:  SeriousBroker.it  This test is no t yet approved or cleared by the [place] FDA and  has been authorized for detection and/or diagnosis of SARS-CoV-2 by FDA under an Emergency Use Authorization (EUA). This EUA will remain  in effect (meaning this test can be used) for the duration of the COVID-19 declaration under Section 564(b)(1) of the Act, 21 U.S.C.section 360bbb-3(b)(1), unless the authorization is terminated  or revoked sooner.       Influenza A by PCR NEGATIVE NEGATIVE Final   Influenza B by PCR NEGATIVE NEGATIVE Final    Comment: (NOTE) The Xpert Xpress SARS-CoV-2/FLU/RSV plus assay is intended as an aid in the diagnosis of influenza from Nasopharyngeal swab specimens and should not be used as a sole basis for treatment. Nasal washings and aspirates are unacceptable for Xpert Xpress SARS-CoV-2/FLU/RSV testing.  Fact Sheet for Patients: BloggerCourse.com  Fact Sheet for Healthcare Providers: SeriousBroker.it  This test is not yet approved or cleared by the [place] and has been authorized for detection and/or diagnosis of SARS-CoV-2 by FDA under an Emergency Use Authorization (EUA). This EUA will remain in effect (meaning this test can be used) for the duration of the COVID-19 declaration under Section 564(b)(1) of the Act, 21 U.S.C. section 360bbb-3(b)(1), unless the authorization is terminated or revoked.      Resp Syncytial Virus by PCR NEGATIVE NEGATIVE Final    Comment: (NOTE) Fact Sheet for Patients: BloggerCourse.com  Fact Sheet for Healthcare Providers: SeriousBroker.it  This test is not yet approved or cleared by the [place] FDA and has been authorized for detection and/or diagnosis of SARS-CoV-2 by FDA under an Emergency Use Authorization (EUA). This EUA will remain in effect (meaning this test can be used) for the duration of the COVID-19 declaration under Section 564(b)(1) of the Act, 21 U.S.C. section 360bbb-3(b)(1), unless the authorization is terminated or revoked.  Performed at [facility], [address], [place], [place] [address]   Blood Culture (routine x 2)     Status: Abnormal   Collection Time: 08/24/22 10:01 PM   Specimen: BLOOD  Result Value Ref Range Status   Specimen Description   Final    BLOOD BLOOD LEFT HAND Performed at [facility], [address] [place], [place] [address]    Special Requests   Final    BOTTLES DRAWN AEROBIC AND ANAEROBIC Blood Culture adequate volume Performed at [facility], [address] [place], [place] [address]    Culture  Setup Time   Final    GRAM POSITIVE COCCI IN BOTH AEROBIC AND ANAEROBIC BOTTLES Gram Stain Report Called to,Read Back By and Verified With: PIPKIN,C ON 08/25/22 AT 2110 BY LOY,C CRITICAL RESULT CALLED TO, READ BACK BY AND VERIFIED WITH: C PIPKINS,RN@0155  11/26/22 [facility] Performed at [facility], [address] [place], [place] [address]    Culture (A)  Final    AEROCOCCUS URINAE Standardized susceptibility testing for this organism is not available. Performed at [facility], [address], [place], [place] [address]    Report Status 08/29/2022 FINAL  Final  Blood Culture ID Panel (Reflexed)     Status: None   Collection Time: 08/24/22 10:01 PM  Result Value Ref Range Status   Enterococcus faecalis NOT DETECTED NOT DETECTED Final    Enterococcus Faecium NOT DETECTED NOT DETECTED Final   Listeria monocytogenes NOT DETECTED NOT DETECTED Final   Staphylococcus species NOT DETECTED NOT DETECTED Final   Staphylococcus aureus (BCID) NOT DETECTED NOT DETECTED Final   Staphylococcus epidermidis NOT DETECTED NOT DETECTED Final   Staphylococcus lugdunensis NOT DETECTED NOT DETECTED Final   Streptococcus species NOT DETECTED NOT DETECTED Final   Streptococcus agalactiae NOT DETECTED NOT DETECTED Final   Streptococcus pneumoniae NOT DETECTED NOT DETECTED Final   Streptococcus pyogenes NOT DETECTED NOT DETECTED Final   A.calcoaceticus-baumannii NOT DETECTED NOT DETECTED Final   Bacteroides fragilis NOT DETECTED NOT DETECTED Final   Enterobacterales NOT DETECTED NOT DETECTED Final   Enterobacter cloacae complex NOT DETECTED NOT DETECTED Final   Escherichia coli NOT DETECTED NOT DETECTED Final   Klebsiella aerogenes NOT DETECTED NOT DETECTED Final   Klebsiella oxytoca NOT DETECTED NOT DETECTED Final   Klebsiella pneumoniae NOT DETECTED NOT DETECTED Final   Proteus species NOT DETECTED NOT DETECTED Final   Salmonella species NOT DETECTED NOT DETECTED Final   Serratia marcescens NOT DETECTED NOT DETECTED Final   Haemophilus influenzae NOT DETECTED  NOT DETECTED Final   Neisseria meningitidis NOT DETECTED NOT DETECTED Final   Pseudomonas aeruginosa NOT DETECTED NOT DETECTED Final   Stenotrophomonas maltophilia NOT DETECTED NOT DETECTED Final   Candida albicans NOT DETECTED NOT DETECTED Final   Candida auris NOT DETECTED NOT DETECTED Final   Candida glabrata NOT DETECTED NOT DETECTED Final   Candida krusei NOT DETECTED NOT DETECTED Final   Candida parapsilosis NOT DETECTED NOT DETECTED Final   Candida tropicalis NOT DETECTED NOT DETECTED Final   Cryptococcus neoformans/gattii NOT DETECTED NOT DETECTED Final    Comment: Performed at [facility], [address] [place], [place] [address]  Blood Culture (routine x 2)      Status: Abnormal   Collection Time: 08/24/22 10:02 PM   Specimen: BLOOD  Result Value Ref Range Status   Specimen Description   Final    BLOOD BLOOD LEFT WRIST Performed at [facility], [address], [place], [place] [address]    Special Requests   Final    BOTTLES DRAWN AEROBIC AND ANAEROBIC Blood Culture adequate volume Performed at [facility], [address], [place], [place] [address]    Culture  Setup Time   Final    GRAM POSITIVE COCCI AEROBIC BOTTLE ONLY CRITICAL VALUE NOTED.  VALUE IS CONSISTENT WITH PREVIOUSLY REPORTED AND CALLED VALUE. GRAM POSITIVE RODS ANAEROBIC BOTTLE ONLY Gram Stain Report Called to,Read Back By and Verified With: MADISON,GINGER @ [phone] ON 08/26/2022 BY FRATTO,ASHLEY Performed at [facility], [address], [place], [place] [address]    Culture (A)  Final    AEROCOCCUS URINAE GRAM POSITIVE RODS Actinotignum schaalii  Standardized susceptibility testing for this organism is not available. Performed at [facility], [address], [place], [place] [address]    Report Status 08/28/2022 FINAL  Final  Culture, blood (Routine X 2) w Reflex to ID Panel     Status: None (Preliminary result)   Collection Time: 08/26/22 11:50 AM   Specimen: BLOOD RIGHT HAND  Result Value Ref Range Status   Specimen Description BLOOD RIGHT HAND  Final   Special Requests   Final    BOTTLES DRAWN AEROBIC AND ANAEROBIC Blood Culture results may not be optimal due to an excessive volume of blood received in culture bottles   Culture   Final    NO GROWTH 4 DAYS Performed at [facility], [address] [place], [place] [address]    Report Status PENDING  Incomplete  Culture, blood (Routine X 2) w Reflex to ID Panel     Status: None (Preliminary result)   Collection Time: 08/26/22 11:50 AM   Specimen: Right Antecubital; Blood  Result Value Ref Range Status   Specimen Description RIGHT ANTECUBITAL  Final   Special Requests   Final    BOTTLES DRAWN AEROBIC AND  ANAEROBIC Blood Culture adequate volume   Culture   Final    NO GROWTH 4 DAYS Performed at [facility], [address], [place], [place] [address]    Report Status PENDING  Incomplete      Radiology Studies: VAS [place] ABI WITH/WO TBI  Result Date: 08/29/2022  LOWER EXTREMITY DOPPLER STUDY Patient Name:  ODIE NEHER Coiro SR.  Date of Exam:   08/28/2022 Medical Rec #: [id number]         Accession #:    [id number] Date of Birth: 08-Sep-1928         Patient Gender: M Patient Age:   72 years [facility]am [facility] Procedure:      VAS [place] ABI WITH/WO TBI Referring Phys: Teya Otterson --------------------------------------------------------------------------------  Indications: Ulceration. Other Factors: Osteomyelitis, paroxysmal atrial fibrillation.  Limitations: Today's exam was limited due to atrial fibrillation, bandages. Comparison Study: No prior study on file Performing Technologist: Sherren Kerns RVS  Examination Guidelines: A complete evaluation includes at minimum, Doppler waveform signals and systolic blood pressure reading at the level of bilateral brachial, anterior tibial, and posterior tibial arteries, when vessel segments are accessible. Bilateral testing is considered an integral part of a complete examination. Photoelectric Plethysmograph (PPG) waveforms and toe systolic pressure readings are included as required and additional duplex testing as needed. Limited examinations for reoccurring indications may be performed as noted.  ABI Findings: +---------+------------------+-----+-----------+--------+ Right    Rt Pressure (mmHg)IndexWaveform   Comment  +---------+------------------+-----+-----------+--------+ Brachial 150                    multiphasic         +---------+------------------+-----+-----------+--------+ PTA      105               0.62 monophasic          +---------+------------------+-----+-----------+--------+ DP       109               0.65 monophasic           +---------+------------------+-----+-----------+--------+ Great Toe45                0.27 Abnormal            +---------+------------------+-----+-----------+--------+ +---------+------------------+-----+-----------+-------+ Left     Lt Pressure (mmHg)IndexWaveform   Comment +---------+------------------+-----+-----------+-------+ Brachial 168                    multiphasic        +---------+------------------+-----+-----------+-------+ PTA      186               1.11 multiphasic        +---------+------------------+-----+-----------+-------+ DP       185               1.10 multiphasic        +---------+------------------+-----+-----------+-------+ Great Toe102               0.61 Abnormal           +---------+------------------+-----+-----------+-------+ +-------+-----------+-----------+------------+------------+ ABI/TBIToday's ABIToday's TBIPrevious ABIPrevious TBI +-------+-----------+-----------+------------+------------+ Right  0.65       0.27                                +-------+-----------+-----------+------------+------------+ Left   1.11       0.61                                +-------+-----------+-----------+------------+------------+  Summary: Right: Resting right ankle-brachial index indicates moderate right lower extremity arterial disease. The right toe-brachial index is abnormal. Left: Resting left ankle-brachial index is within normal range. The left toe-brachial index is abnormal. *See table(s) above for measurements and observations.  Electronically signed by Lemar Livings MD on 08/29/2022 at 10:26:35 AM.    Final       LOS: 5 days    Jacquelin Hawking, MD [facility] 08/30/2022, 12:14 PM   If 7PM-7AM, please contact night-coverage [email or web address]

## 2022-08-30 NOTE — TOC Progression Note (Signed)
Transition of Care (TOC) - Progression Note    Patient Details  Name: Jesse ORZEL Sr. MRN: [id number] Date of Birth: 11-10-1928  Transition of Care [facility]) CM/SW Contact  Carley Hammed, LCSW Phone Number: 08/30/2022, 2:05 PM  Clinical Narrative:     CSW spoke with Lynnea Ferrier from [facility], she continues to follow, but before reviewing for admission she will need the ABX pt is on and the dosage. Facility needs to determine the cost prior to accepting pt. ID note still working on determining the ABX at DC. Facility requested a new referral be sent when that is determined. TOC will continue to follow for DC needs.   Expected Discharge Plan: Skilled Nursing Facility Barriers to Discharge: Continued Medical Work up  Expected Discharge Plan and Services In-house Referral: Clinical Social Work Discharge Planning Services: CM Consult Post Acute Care Choice: Skilled Nursing Facility Living arrangements for the past 2 months: Single Family Home                                       Social Determinants of Health (SDOH) Interventions SDOH Screenings   Food Insecurity: No Food Insecurity (08/25/2022)  Housing: Low Risk  (08/25/2022)  Transportation Needs: No Transportation Needs (08/25/2022)  Utilities: Not At Risk (08/25/2022)  Alcohol Screen: Low Risk  (07/24/2019)  Depression (PHQ2-9): Low Risk  (07/24/2019)  Financial Resource Strain: Low Risk  (07/24/2019)  Physical Activity: Inactive (07/24/2019)  Social Connections: Moderately Integrated (07/24/2019)  Stress: No Stress Concern Present (07/24/2019)  Tobacco Use: Low Risk  (08/24/2022)    Readmission Risk Interventions     No data to display

## 2022-08-30 NOTE — Progress Notes (Signed)
Physical Therapy Treatment Patient Details Name: Jesse POIST Sr. MRN: [id number] DOB: [birth year] Today's Date: 08/30/2022   History of Present Illness 89 y.o. male [facility] to [facility] 08/24/2022 due to generalized progressive weakness. 5/8 found to be febrile with leukocytosis, started on antibiotics for sepsis with unclear source. 5/9 Transferred to [facility] hospital found to have gram-positive cocci bacteremia with possible R foot osteomyelitis. PMH includes hypertension, BPH, prostate cancer    PT Comments    Pt is up to side of bed with max assist of PT and then back to bed after mult standing attempts, in which pt could not fully stand.  He is max assist of two to stand on the stedy today, after being listed one person for last visit.  Given the magnitude of his care assistance, he is recommended to SNF for care to increase control of standing and balance.  Follow along with him for PT goals as listed on POC.   Recommendations for follow up therapy are one component of a multi-disciplinary discharge planning process, led by the attending physician.  Recommendations may be updated based on patient status, additional functional criteria and insurance authorization.  Follow Up Recommendations  Can patient physically be transported by private vehicle: No    Assistance Recommended at Discharge Frequent or constant Supervision/Assistance  Patient can return home with the following Two people to help with walking and/or transfers;A lot of help with bathing/dressing/bathroom;Assistance with cooking/housework;Assist for transportation;Help with stairs or ramp for entrance   Equipment Recommendations  None recommended by PT    Recommendations for Other Services       Precautions / Restrictions Precautions Precautions: Fall Precaution Comments: requires help to move to initiate Restrictions Weight Bearing Restrictions: No     Mobility  Bed Mobility Overal bed mobility: Needs  Assistance Bed Mobility: Supine to Sit, Sit to Supine     Supine to sit: Max assist, HOB elevated Sit to supine: Total assist, HOB elevated   General bed mobility comments: level of assistance is greater than her previous PT visit    Transfers Overall transfer level: Needs assistance Equipment used: Rolling walker (2 wheels) Transfers: Sit to/from Stand, Bed to chair/wheelchair/BSC Sit to Stand: Max assist, +2 physical assistance, +2 safety/equipment           General transfer comment: requires help to get to chair with stedy, 2 max to stand and from bed with RW was partially stood by PT Transfer via Lift Equipment: Stedy  Ambulation/Gait               General Gait Details: unable   Social research officer, government Rankin (Stroke Patients Only)       Balance Overall balance assessment: Needs assistance Sitting-balance support: Feet supported Sitting balance-Leahy Scale: Poor Sitting balance - Comments: seated at EOB Postural control: Posterior lean Standing balance support: Bilateral upper extremity supported, During functional activity, Reliant on assistive device for balance Standing balance-Leahy Scale: Poor Standing balance comment: partial standing with one person, two with max assist to stand                            Cognition Arousal/Alertness: Awake/alert Behavior During Therapy: Flat affect Overall Cognitive Status: Difficult to assess  General Comments: pt is very HOH but also struggling to move with help        Exercises      General Comments General comments (skin integrity, edema, etc.): Pt was seen for mobility on RW to stand attempt and then on stedy with two person help to get up to prop and stand on device      Pertinent Vitals/Pain Pain Assessment Pain Assessment: Faces Faces Pain Scale: Hurts little more Pain Location: R hip with any attempt  to stand Pain Descriptors / Indicators: Guarding, Grimacing Pain Intervention(s): Limited activity within patient's tolerance, Monitored during session, Repositioned, Patient requesting pain meds-RN notified    Home Living                          Prior Function            PT Goals (current goals can now be found in the care plan section) Acute Rehab PT Goals Patient Stated Goal: return home after rehab    Frequency    Min 3X/week      PT Plan Current plan remains appropriate    Co-evaluation              AM-PAC PT "6 Clicks" Mobility   Outcome Measure  Help needed turning from your back to your side while in a flat bed without using bedrails?: A Lot Help needed moving from lying on your back to sitting on the side of a flat bed without using bedrails?: A Lot Help needed moving to and from a bed to a chair (including a wheelchair)?: Total Help needed standing up from a chair using your arms (e.g., wheelchair or bedside chair)?: Total Help needed to walk in hospital room?: Total Help needed climbing 3-5 steps with a railing? : Total 6 Click Score: 8    End of Session Equipment Utilized During Treatment: Gait belt Activity Tolerance: Patient limited by fatigue;Patient limited by pain;Treatment limited secondary to medical complications (Comment) Patient left: in chair;with call bell/phone within reach;with family/visitor present Nurse Communication: Mobility status;Need for lift equipment PT Visit Diagnosis: Unsteadiness on feet (R26.81);Other abnormalities of gait and mobility (R26.89);Muscle weakness (generalized) (M62.81)     Time: 6578-4696 (+[id number]) PT Time Calculation (min) (ACUTE ONLY): 37 min  Charges:  $Therapeutic Activity: 38-52 mins $Neuromuscular Re-education: 8-22 mins           Ivar Drape 08/30/2022, 3:52 PM  Samul Dada, PT PhD Acute Rehab Dept. Number: [facility] [id number] and [facility] [phone]

## 2022-08-30 NOTE — Progress Notes (Signed)
Subjective: No new complaints   Antibiotics:  Anti-infectives (From admission, onward)    Start     Dose/Rate Route Frequency Ordered Stop   08/30/22 2200  cefTRIAXone (ROCEPHIN) 2 g in sodium chloride 0.9 % 100 mL IVPB        2 g 200 mL/hr over 30 Minutes Intravenous Every 24 hours 08/30/22 1143     08/25/22 2200  vancomycin (VANCOCIN) IVPB 1000 mg/200 mL premix  Status:  Discontinued        1,000 mg 200 mL/hr over 60 Minutes Intravenous Every 24 hours 08/24/22 2126 08/30/22 1143   08/25/22 1800  metroNIDAZOLE (FLAGYL) tablet 500 mg        500 mg Oral Every 12 hours 08/25/22 1709     08/24/22 2130  ceFEPIme (MAXIPIME) 2 g in sodium chloride 0.9 % 100 mL IVPB  Status:  Discontinued        2 g 200 mL/hr over 30 Minutes Intravenous  Once 08/24/22 2120 08/24/22 2126   08/24/22 2130  metroNIDAZOLE (FLAGYL) IVPB 500 mg        500 mg 100 mL/hr over 60 Minutes Intravenous  Once 08/24/22 2120 08/24/22 2312   08/24/22 2130  vancomycin (VANCOCIN) IVPB 1000 mg/200 mL premix  Status:  Discontinued        1,000 mg 200 mL/hr over 60 Minutes Intravenous  Once 08/24/22 2120 08/24/22 2126   08/24/22 2130  ceFEPIme (MAXIPIME) 2 g in sodium chloride 0.9 % 100 mL IVPB  Status:  Discontinued        2 g 200 mL/hr over 30 Minutes Intravenous Every 12 hours 08/24/22 2126 08/26/22 1426   08/24/22 2130  vancomycin (VANCOREADY) IVPB 1250 mg/250 mL        1,250 mg 166.7 mL/hr over 90 Minutes Intravenous  Once 08/24/22 2126 08/25/22 0029       Medications: Scheduled Meds:  cyanocobalamin  1,000 mcg Intramuscular Daily   dextromethorphan-guaiFENesin  1 tablet Oral BID   enoxaparin (LOVENOX) injection  40 mg Subcutaneous Q24H   furosemide  20 mg Intravenous Daily   leptospermum manuka honey  1 Application Topical Daily   metroNIDAZOLE  500 mg Oral Q12H   multivitamin with minerals  1 tablet Oral Daily   pantoprazole  40 mg Oral Daily   tamsulosin  0.4 mg Oral QPC supper   Continuous  Infusions:  cefTRIAXone (ROCEPHIN)  IV     PRN Meds:.acetaminophen **OR** acetaminophen, hydrALAZINE, ipratropium-albuterol, ondansetron **OR** ondansetron (ZOFRAN) IV    Objective: Weight change:   Intake/Output Summary (Last 24 hours) at 08/30/2022 1152 Last data filed at 08/30/2022 0500 Gross per 24 hour  Intake 960 ml  Output 2300 ml  Net -1340 ml   Blood pressure 135/63, pulse (!) 58, temperature 98.2 F (36.8 C), temperature source Oral, resp. rate 16, height 5\' 7"  (1.702 m), weight 82.1 kg, SpO2 100 %. Temp:  [98.2 F (36.8 C)-98.7 F (37.1 C)] 98.2 F (36.8 C) (05/13 0543) Pulse Rate:  [55-58] 58 (05/13 0543) Resp:  [16-17] 16 (05/13 0543) BP: (135-140)/(63-83) 135/63 (05/13 0543) SpO2:  [100 %] 100 % (05/13 0543)  Physical Exam: Physical Exam Constitutional:      Appearance: [name] is well-developed.  HENT:     Head: Normocephalic and atraumatic.  Eyes:     Conjunctiva/sclera: Conjunctivae normal.  Cardiovascular:     Rate and Rhythm: Normal rate and regular rhythm.  Pulmonary:     Effort: Pulmonary effort is normal.  No respiratory distress.     Breath sounds: No wheezing.  Abdominal:     General: There is no distension.     Palpations: Abdomen is soft.  Musculoskeletal:     Cervical back: Normal range of motion and neck supple.  Skin:    General: Skin is warm and dry.     Findings: No erythema or rash.  Neurological:     General: No focal deficit present.     Mental Status: [name] is alert and oriented to person, place, and time.  Psychiatric:        Mood and Affect: Mood normal.        Behavior: Behavior normal.        Thought Content: Thought content normal.        Judgment: Judgment normal.     Hard of hearing  CBC:    BMET Recent Labs    08/29/22 0031  NA 134*  K 4.0  CL 99  CO2 25  GLUCOSE 119*  BUN 21  CREATININE 0.99  CALCIUM 8.6*     Liver Panel  No results for input(s): "PROT", "ALBUMIN", "AST", "ALT", "ALKPHOS", "BILITOT",  "BILIDIR", "IBILI" in the last 72 hours.     Sedimentation Rate No results for input(s): "ESRSEDRATE" in the last 72 hours. C-Reactive Protein No results for input(s): "CRP" in the last 72 hours.  Micro Results: Recent Results (from the past 720 hour(s))  Urine Culture (for pregnant, neutropenic or urologic patients or patients with an indwelling urinary catheter)     Status: Abnormal   Collection Time: 08/24/22  4:50 PM   Specimen: Urine, Clean Catch  Result Value Ref Range Status   Specimen Description   Final    URINE, CLEAN CATCH Performed at [facility], [address] [place], [place] [address]    Special Requests   Final    NONE Performed at [facility], [address], [place], [place] [address]    Culture (A)  Final    >=100,000 COLONIES/mL AEROCOCCUS SPECIES Standardized susceptibility testing for this organism is not available. Performed at [facility], [address] [place], [place] [address]    Report Status 08/27/2022 FINAL  Final  Resp panel by RT-PCR (RSV, Flu A&B, Covid) Anterior Nasal Swab     Status: None   Collection Time: 08/24/22  9:55 PM   Specimen: Anterior Nasal Swab  Result Value Ref Range Status   SARS Coronavirus 2 by RT PCR NEGATIVE NEGATIVE Final    Comment: (NOTE) SARS-CoV-2 target nucleic acids are NOT DETECTED.  The SARS-CoV-2 RNA is generally detectable in upper respiratory specimens during the acute phase of infection. The lowest concentration of SARS-CoV-2 viral copies this assay can detect is 138 copies/mL. A negative result does not preclude SARS-Cov-2 infection and should not be used as the sole basis for treatment or other patient management decisions. A negative result may occur with  improper specimen collection/handling, submission of specimen other than nasopharyngeal swab, presence of viral mutation(s) within the areas targeted by this assay, and inadequate number of viral copies(<138 copies/mL). A negative result  must be combined with clinical observations, patient history, and epidemiological information. The expected result is Negative.  Fact Sheet for Patients:  BloggerCourse.com  Fact Sheet for Healthcare Providers:  SeriousBroker.it  This test is no t yet approved or cleared by the [place] FDA and  has been authorized for detection and/or diagnosis of SARS-CoV-2 by FDA under an Emergency Use Authorization (EUA). This  EUA will remain  in effect (meaning this test can be used) for the duration of the COVID-19 declaration under Section 564(b)(1) of the Act, 21 U.S.C.section 360bbb-3(b)(1), unless the authorization is terminated  or revoked sooner.       Influenza A by PCR NEGATIVE NEGATIVE Final   Influenza B by PCR NEGATIVE NEGATIVE Final    Comment: (NOTE) The Xpert Xpress SARS-CoV-2/FLU/RSV plus assay is intended as an aid in the diagnosis of influenza from Nasopharyngeal swab specimens and should not be used as a sole basis for treatment. Nasal washings and aspirates are unacceptable for Xpert Xpress SARS-CoV-2/FLU/RSV testing.  Fact Sheet for Patients: BloggerCourse.com  Fact Sheet for Healthcare Providers: SeriousBroker.it  This test is not yet approved or cleared by the [place] FDA and has been authorized for detection and/or diagnosis of SARS-CoV-2 by FDA under an Emergency Use Authorization (EUA). This EUA will remain in effect (meaning this test can be used) for the duration of the COVID-19 declaration under Section 564(b)(1) of the Act, 21 U.S.C. section 360bbb-3(b)(1), unless the authorization is terminated or revoked.     Resp Syncytial Virus by PCR NEGATIVE NEGATIVE Final    Comment: (NOTE) Fact Sheet for Patients: BloggerCourse.com  Fact Sheet for Healthcare Providers: SeriousBroker.it  This test is  not yet approved or cleared by the [place] FDA and has been authorized for detection and/or diagnosis of SARS-CoV-2 by FDA under an Emergency Use Authorization (EUA). This EUA will remain in effect (meaning this test can be used) for the duration of the COVID-19 declaration under Section 564(b)(1) of the Act, 21 U.S.C. section 360bbb-3(b)(1), unless the authorization is terminated or revoked.  Performed at [facility], [address] [place], [place] [address]   Blood Culture (routine x 2)     Status: Abnormal   Collection Time: 08/24/22 10:01 PM   Specimen: BLOOD  Result Value Ref Range Status   Specimen Description   Final    BLOOD BLOOD LEFT HAND Performed at [facility], [address] [place], [place] [address]    Special Requests   Final    BOTTLES DRAWN AEROBIC AND ANAEROBIC Blood Culture adequate volume Performed at [facility], [address], [place], [place] [address]    Culture  Setup Time   Final    GRAM POSITIVE COCCI IN BOTH AEROBIC AND ANAEROBIC BOTTLES Gram Stain Report Called to,Read Back By and Verified With: PIPKIN,C ON 08/25/22 AT 2110 BY LOY,C CRITICAL RESULT CALLED TO, READ BACK BY AND VERIFIED WITH: C PIPKINS,RN@[email or web address]  11/26/22 [facility] Performed at [facility], [address] [place], [place] [address]    Culture (A)  Final    AEROCOCCUS URINAE Standardized susceptibility testing for this organism is not available. Performed at [facility], [address], [place], [place] [address]    Report Status 08/29/2022 FINAL  Final  Blood Culture ID Panel (Reflexed)     Status: None   Collection Time: 08/24/22 10:01 PM  Result Value Ref Range Status   Enterococcus faecalis NOT DETECTED NOT DETECTED Final   Enterococcus Faecium NOT DETECTED NOT DETECTED Final   Listeria monocytogenes NOT DETECTED NOT DETECTED Final   Staphylococcus species NOT DETECTED NOT DETECTED Final   Staphylococcus aureus (BCID) NOT DETECTED NOT DETECTED Final   Staphylococcus  epidermidis NOT DETECTED NOT DETECTED Final   Staphylococcus lugdunensis NOT DETECTED NOT DETECTED Final   Streptococcus species NOT DETECTED NOT DETECTED Final   Streptococcus agalactiae NOT DETECTED NOT DETECTED Final  Streptococcus pneumoniae NOT DETECTED NOT DETECTED Final   Streptococcus pyogenes NOT DETECTED NOT DETECTED Final   A.calcoaceticus-baumannii NOT DETECTED NOT DETECTED Final   Bacteroides fragilis NOT DETECTED NOT DETECTED Final   Enterobacterales NOT DETECTED NOT DETECTED Final   Enterobacter cloacae complex NOT DETECTED NOT DETECTED Final   Escherichia coli NOT DETECTED NOT DETECTED Final   Klebsiella aerogenes NOT DETECTED NOT DETECTED Final   Klebsiella oxytoca NOT DETECTED NOT DETECTED Final   Klebsiella pneumoniae NOT DETECTED NOT DETECTED Final   Proteus species NOT DETECTED NOT DETECTED Final   Salmonella species NOT DETECTED NOT DETECTED Final   Serratia marcescens NOT DETECTED NOT DETECTED Final   Haemophilus influenzae NOT DETECTED NOT DETECTED Final   Neisseria meningitidis NOT DETECTED NOT DETECTED Final   Pseudomonas aeruginosa NOT DETECTED NOT DETECTED Final   Stenotrophomonas maltophilia NOT DETECTED NOT DETECTED Final   Candida albicans NOT DETECTED NOT DETECTED Final   Candida auris NOT DETECTED NOT DETECTED Final   Candida glabrata NOT DETECTED NOT DETECTED Final   Candida krusei NOT DETECTED NOT DETECTED Final   Candida parapsilosis NOT DETECTED NOT DETECTED Final   Candida tropicalis NOT DETECTED NOT DETECTED Final   Cryptococcus neoformans/gattii NOT DETECTED NOT DETECTED Final    Comment: Performed at [facility], [address], [place], [place] [address]  Blood Culture (routine x 2)     Status: Abnormal   Collection Time: 08/24/22 10:02 PM   Specimen: BLOOD  Result Value Ref Range Status   Specimen Description   Final    BLOOD BLOOD LEFT WRIST Performed at [facility], [address] [place], [place] [address]    Special Requests    Final    BOTTLES DRAWN AEROBIC AND ANAEROBIC Blood Culture adequate volume Performed at [facility], [address] [place], [place] [address]    Culture  Setup Time   Final    GRAM POSITIVE COCCI AEROBIC BOTTLE ONLY CRITICAL VALUE NOTED.  VALUE IS CONSISTENT WITH PREVIOUSLY REPORTED AND CALLED VALUE. GRAM POSITIVE RODS ANAEROBIC BOTTLE ONLY Gram Stain Report Called to,Read Back By and Verified With: MADISON,GINGER @ [phone] ON 08/26/2022 BY FRATTO,ASHLEY Performed at [facility], [address], [place], [place] [address]    Culture (A)  Final    AEROCOCCUS URINAE GRAM POSITIVE RODS Actinotignum schaalii  Standardized susceptibility testing for this organism is not available. Performed at [facility], [address], [place], [place] [address]    Report Status 08/28/2022 FINAL  Final  Culture, blood (Routine X 2) w Reflex to ID Panel     Status: None (Preliminary result)   Collection Time: 08/26/22 11:50 AM   Specimen: BLOOD RIGHT HAND  Result Value Ref Range Status   Specimen Description BLOOD RIGHT HAND  Final   Special Requests   Final    BOTTLES DRAWN AEROBIC AND ANAEROBIC Blood Culture results may not be optimal due to an excessive volume of blood received in culture bottles   Culture   Final    NO GROWTH 4 DAYS Performed at [facility], [address], [place], [place] [address]    Report Status PENDING  Incomplete  Culture, blood (Routine X 2) w Reflex to ID Panel     Status: None (Preliminary result)   Collection Time: 08/26/22 11:50 AM   Specimen: Right Antecubital; Blood  Result Value Ref Range Status   Specimen Description RIGHT ANTECUBITAL  Final   Special Requests   Final    BOTTLES  DRAWN AEROBIC AND ANAEROBIC Blood Culture adequate volume   Culture   Final    NO GROWTH 4 DAYS Performed at [facility], [address], [place], [place] [address]    Report Status PENDING  Incomplete    Studies/Results: VAS [place] ABI WITH/WO TBI  Result Date:  08/29/2022  LOWER EXTREMITY DOPPLER STUDY Patient Name:  Jesse OPALEWSKI Linch SR.  Date of Exam:   08/28/2022 Medical Rec #: [id number]         Accession #:    [id number] Date of Birth: [birth year]         Patient Gender: M Patient Age:   89 years Exam Location:  [facility] Procedure:      VAS [place] ABI WITH/WO TBI Referring Phys: RALPH NETTEY --------------------------------------------------------------------------------  Indications: Ulceration. Other Factors: Osteomyelitis, paroxysmal atrial fibrillation.  Limitations: Today's exam was limited due to atrial fibrillation, bandages. Comparison Study: No prior study on file Performing Technologist: Sherren Kerns RVS  Examination Guidelines: A complete evaluation includes at minimum, Doppler waveform signals and systolic blood pressure reading at the level of bilateral brachial, anterior tibial, and posterior tibial arteries, when vessel segments are accessible. Bilateral testing is considered an integral part of a complete examination. Photoelectric Plethysmograph (PPG) waveforms and toe systolic pressure readings are included as required and additional duplex testing as needed. Limited examinations for reoccurring indications may be performed as noted.  ABI Findings: +---------+------------------+-----+-----------+--------+ Right    Rt Pressure (mmHg)IndexWaveform   Comment  +---------+------------------+-----+-----------+--------+ Brachial 150                    multiphasic         +---------+------------------+-----+-----------+--------+ PTA      105               0.62 monophasic          +---------+------------------+-----+-----------+--------+ DP       109               0.65 monophasic          +---------+------------------+-----+-----------+--------+ Great Toe45                0.27 Abnormal            +---------+------------------+-----+-----------+--------+ +---------+------------------+-----+-----------+-------+ Left     Lt  Pressure (mmHg)IndexWaveform   Comment +---------+------------------+-----+-----------+-------+ Brachial 168                    multiphasic        +---------+------------------+-----+-----------+-------+ PTA      186               1.11 multiphasic        +---------+------------------+-----+-----------+-------+ DP       185               1.10 multiphasic        +---------+------------------+-----+-----------+-------+ Great Toe102               0.61 Abnormal           +---------+------------------+-----+-----------+-------+ +-------+-----------+-----------+------------+------------+ ABI/TBIToday's ABIToday's TBIPrevious ABIPrevious TBI +-------+-----------+-----------+------------+------------+ Right  0.65       0.27                                +-------+-----------+-----------+------------+------------+ Left   1.11       0.61                                +-------+-----------+-----------+------------+------------+  Summary: Right: Resting right ankle-brachial index indicates moderate right lower extremity arterial disease. The right toe-brachial index is abnormal. Left: Resting left ankle-brachial index is within normal range. The left toe-brachial index is abnormal. *See table(s) above for measurements and observations.  Electronically signed by Lemar Livings MD on 08/29/2022 at 10:26:35 AM.    Final       Assessment/Plan:  INTERVAL HISTORY: CT scan with possible osteomyelitis in the fifth toe   Principal Problem:   Polymicrobial bacterial infection Active Problems:   Fever of undetermined origin   Lactic acidosis   Generalized weakness   Bronchitis   Bilateral pleural effusion   Right foot ulcer (HCC)   Essential hypertension   BPH (benign prostatic hyperplasia)   Paroxysmal atrial fibrillation (HCC)    Jesse Leep Kadlec Sr. is a 89 y.o. male with admission with aerococcus urinae bacteremia and sepsis from urinary source, also with a Actinotignum  schaalii isolated in 1/2 cultures. [name] also has possible osteo in right 5th toe  #1 Aerococcus +/- Actinotignum schaalii  bacteremia  --switch to ceftriaxone.  We will also give an amoxicillin challenge and if it is successful switch him to Unasyn vs AMP  #2 PCN allergy: remote and we will do amoxicillin challenge   #3 ? Osteo: CT scan was not definitive would recommend trying an MRI again but having it done during daylight hours and preferably when family is around as well potentially. [name] has always agreeable when I have seen him and cooperative I suspect [name] was confused the last time MRI was attempted  I have personally spent 52 minutes involved in face-to-face and non-face-to-face activities for this patient on the day of the visit. Professional time spent includes the following activities: Preparing to see the patient (review of tests), Obtaining and/or reviewing separately obtained history (admission/discharge record), Performing a medically appropriate examination and/or evaluation , Ordering medications/tests/procedures, referring and communicating with other health care professionals, Documenting clinical information in the EMR, Independently interpreting results (not separately reported), Communicating results to the patient/family/caregiver, Counseling and educating the patient/family/caregiver and Care coordination (not separately reported).     LOS: 5 days   Acey Lav 08/30/2022, 11:52 AM

## 2022-08-31 DIAGNOSIS — L97514 Non-pressure chronic ulcer of other part of right foot with necrosis of bone: Secondary | ICD-10-CM | POA: Diagnosis not present

## 2022-08-31 DIAGNOSIS — M869 Osteomyelitis, unspecified: Secondary | ICD-10-CM

## 2022-08-31 DIAGNOSIS — N3000 Acute cystitis without hematuria: Secondary | ICD-10-CM

## 2022-08-31 DIAGNOSIS — A499 Bacterial infection, unspecified: Secondary | ICD-10-CM | POA: Diagnosis not present

## 2022-08-31 DIAGNOSIS — I480 Paroxysmal atrial fibrillation: Secondary | ICD-10-CM | POA: Diagnosis not present

## 2022-08-31 DIAGNOSIS — R7881 Bacteremia: Secondary | ICD-10-CM

## 2022-08-31 DIAGNOSIS — J40 Bronchitis, not specified as acute or chronic: Secondary | ICD-10-CM | POA: Diagnosis not present

## 2022-08-31 DIAGNOSIS — M8618 Other acute osteomyelitis, other site: Secondary | ICD-10-CM

## 2022-08-31 DIAGNOSIS — N3001 Acute cystitis with hematuria: Secondary | ICD-10-CM

## 2022-08-31 LAB — CBC
HCT: 28.1 % — ABNORMAL LOW (ref 39.0–52.0)
Hemoglobin: 8.9 g/dL — ABNORMAL LOW (ref 13.0–17.0)
MCH: 32.7 pg (ref 26.0–34.0)
MCHC: 31.7 g/dL (ref 30.0–36.0)
MCV: 103.3 fL — ABNORMAL HIGH (ref 80.0–100.0)
Platelets: 277 10*3/uL (ref 150–400)
RBC: 2.72 MIL/uL — ABNORMAL LOW (ref 4.22–5.81)
RDW: 14.4 % (ref 11.5–15.5)
WBC: 7.3 10*3/uL (ref 4.0–10.5)
nRBC: 0 % (ref 0.0–0.2)

## 2022-08-31 LAB — FOLATE: Folate: 10.5 ng/mL (ref >5.9)

## 2022-08-31 LAB — RETICULOCYTES
Immature Retic Fract: 22 % — ABNORMAL HIGH (ref 2.3–15.9)
RBC.: 2.72 MIL/uL — ABNORMAL LOW (ref 4.22–5.81)
Retic Count, Absolute: 24.8 10*3/uL (ref 19.0–186.0)
Retic Ct Pct: 0.9 % (ref 0.4–3.1)

## 2022-08-31 LAB — IRON AND TIBC
Iron: 115 ug/dL (ref 45–182)
Saturation Ratios: 60 % — ABNORMAL HIGH (ref 17.9–39.5)
TIBC: 193 ug/dL — ABNORMAL LOW (ref 250–450)
UIBC: 78 ug/dL

## 2022-08-31 LAB — CULTURE, BLOOD (ROUTINE X 2)

## 2022-08-31 LAB — FERRITIN: Ferritin: 501 ng/mL — ABNORMAL HIGH (ref 24–336)

## 2022-08-31 LAB — VITAMIN B12: Vitamin B-12: >7500 pg/mL — ABNORMAL HIGH (ref 180–914)

## 2022-08-31 MED ORDER — SODIUM CHLORIDE 0.9 % IV SOLN
3.0000 g | Freq: Four times a day (QID) | INTRAVENOUS | Status: DC
  Administered 2022-08-31 – 2022-09-01 (×3): 3 g via INTRAVENOUS
  Filled 2022-08-31 (×4): qty 8

## 2022-08-31 NOTE — Progress Notes (Signed)
PROGRESS NOTE    Jesse CASAUS Sr.  ZOX:[id number] DOB: [birth year] DOA: 08/24/2022 PCP: Elfredia Nevins, MD   Brief Narrative: Jesse Pollock Sr. is a 89 y.o. male with a history of hypertension, BPH, prostate cancer.  Patient presented secondary to generalized weakness with evidence of sepsis on admission.  Patient was started on empiric antibiotics for found to have gram-positive cocci bacteremia.  Patient also noted to have a right foot ulcer concerning for possible osteomyelitis.  Infectious disease consulted.   Assessment and Plan:  Sepsis Present on admission. Secondary to gram positive bacteremia. Patient was started empirically on Vancomycin, Cefepime and Flagyl, narrowed to Vancomycin and Flagyl and then to Ceftriaxone.  Leukocytosis is improving from 19,800 on admission down to 8,100.  Aerococcus bacteremia Actinotignum schaalii bacteremia Concern this could be related to possible osteomyelitis, however urine culture shows similar organism and is the likely source. Patient also found to have gram positive rods. -ID recommendations: Ceftriaxone  Right foot ulcer Right foot osteomyelitis Patient with peripheral neuropathy, which likely contributed to development and worsening. MRI attempted by failed. CT foot obtained and is suggestive of possible osteomyelitis but inconclusive. MRI confirms osteomyelitis. -Orthopedic surgery consulted (5/14)  Anemia Possibly chronic. Hemoglobin of 11.5 on admission. Slight downtrend. No evidence of bleeding noted -Recheck CBC -Check anemia panel  Peripheral neuropathy Possibly related to vitamin B12 deficiency. Folate is normal. Patient started on vitamin B12 supplementation -Continue Vitamin B12  Progressive weakness Deconditioning but could also be related to ongoing bacteremia. PT/OT recommending SNF on discharge.  Prostate cancer Patient follows with urology. During this admission, sclerotic focus noted on left second rib and is  nonspecific. May need to discuss with IR prior to discharge.  Atrial fibrillation with slow ventricular response Stable currently. Transthoracic Echocardiogram ordered and is significant for mildly dilated left atrium with normal LVEF.  Pressure injury Left heel, right buttocks.  Both present on admission.   DVT prophylaxis: Lovenox Code Status:   Code Status: Full Code Family Communication: Son and daughter-in-law at bedside Disposition Plan: Discharge home likely in several days pending continued management/workup for bacteremia and foot ulcer/osteomyelitis   Consultants:  Infectious disease Orthopedic surgery  Procedures:  5/9: Transthoracic echocardiogram  Antimicrobials: Vancomycin Cefepime Flagyl   Subjective: Patient reports no concern this morning.  Objective: BP 137/66 (BP Location: Left Arm)   Pulse (!) 58   Temp 98 F (36.7 C) (Oral)   Resp 16   Ht 5\' 7"  (1.702 m)   Wt 82.1 kg   SpO2 100%   BMI 28.35 kg/m   Examination:  General exam: Appears calm and comfortable Respiratory system: Clear to auscultation. Respiratory effort normal. Cardiovascular system: S1 & S2 heard, RRR. Gastrointestinal system: Abdomen is nondistended, soft and nontender. Normal bowel sounds heard. Central nervous system: Alert and oriented. No focal neurological deficits. Psychiatry: Judgement and insight appear normal. Mood & affect appropriate.    Data Reviewed: I have personally reviewed following labs and imaging studies  CBC Lab Results  Component Value Date   WBC 8.1 08/29/2022   RBC 2.59 (L) 08/29/2022   HGB 8.7 (L) 08/29/2022   HCT 26.4 (L) 08/29/2022   MCV 101.9 (H) 08/29/2022   MCH 33.6 08/29/2022   PLT 241 08/29/2022   MCHC 33.0 08/29/2022   RDW 14.4 08/29/2022   LYMPHSABS 2.2 08/24/2022   MONOABS 0.4 08/24/2022   EOSABS 0.1 08/24/2022   BASOSABS 0.1 08/24/2022     Last metabolic panel Lab Results  Component Value  Date   NA 134 (L) 08/29/2022   K  4.0 08/29/2022   CL 99 08/29/2022   CO2 25 08/29/2022   BUN 21 08/29/2022   CREATININE 0.99 08/29/2022   GLUCOSE 119 (H) 08/29/2022   GFRNONAA >60 08/29/2022   GFRAA 55 (L) 07/24/2019   CALCIUM 8.6 (L) 08/29/2022   PHOS 3.3 08/25/2022   PROT 5.9 (L) 08/27/2022   ALBUMIN 2.6 (L) 08/27/2022   BILITOT 0.8 08/27/2022   ALKPHOS 46 08/27/2022   AST 33 08/27/2022   ALT 18 08/27/2022   ANIONGAP 10 08/29/2022    GFR: Estimated Creatinine Clearance: 48.8 mL/min (by C-G formula based on SCr of 0.99 mg/dL).  Recent Results (from the past 240 hour(s))  Urine Culture (for pregnant, neutropenic or urologic patients or patients with an indwelling urinary catheter)     Status: Abnormal   Collection Time: 08/24/22  4:50 PM   Specimen: Urine, Clean Catch  Result Value Ref Range Status   Specimen Description   Final    URINE, CLEAN CATCH Performed at [facility], [address], [place], [place] [address]    Special Requests   Final    NONE Performed at [facility], [address], [place], [place] [address]    Culture (A)  Final    >=100,000 COLONIES/mL AEROCOCCUS SPECIES Standardized susceptibility testing for this organism is not available. Performed at [facility], [address], [place], [place] [address]    Report Status 08/27/2022 FINAL  Final  Resp panel by RT-PCR (RSV, Flu A&B, Covid) Anterior Nasal Swab     Status: None   Collection Time: 08/24/22  9:55 PM   Specimen: Anterior Nasal Swab  Result Value Ref Range Status   SARS Coronavirus 2 by RT PCR NEGATIVE NEGATIVE Final    Comment: (NOTE) SARS-CoV-2 target nucleic acids are NOT DETECTED.  The SARS-CoV-2 RNA is generally detectable in upper respiratory specimens during the acute phase of infection. The lowest concentration of SARS-CoV-2 viral copies this assay can detect is 138 copies/mL. A negative result does not preclude SARS-Cov-2 infection and should not be used as the sole basis for treatment or other  patient management decisions. A negative result may occur with  improper specimen collection/handling, submission of specimen other than nasopharyngeal swab, presence of viral mutation(s) within the areas targeted by this assay, and inadequate number of viral copies(<138 copies/mL). A negative result must be combined with clinical observations, patient history, and epidemiological information. The expected result is Negative.  Fact Sheet for Patients:  BloggerCourse.com  Fact Sheet for Healthcare Providers:  SeriousBroker.it  This test is no t yet approved or cleared by the [place] FDA and  has been authorized for detection and/or diagnosis of SARS-CoV-2 by FDA under an Emergency Use Authorization (EUA). This EUA will remain  in effect (meaning this test can be used) for the duration of the COVID-19 declaration under Section 564(b)(1) of the Act, 21 U.S.C.section 360bbb-3(b)(1), unless the authorization is terminated  or revoked sooner.       Influenza A by PCR NEGATIVE NEGATIVE Final   Influenza B by PCR NEGATIVE NEGATIVE Final    Comment: (NOTE) The Xpert Xpress SARS-CoV-2/FLU/RSV plus assay is intended as an aid in the diagnosis of influenza from Nasopharyngeal swab specimens and should not be used as a sole basis for treatment. Nasal washings and aspirates are unacceptable for Xpert Xpress SARS-CoV-2/FLU/RSV testing.  Fact Sheet for Patients: BloggerCourse.com  Fact Sheet for Healthcare Providers: SeriousBroker.it  This  test is not yet approved or cleared by the [place] and has been authorized for detection and/or diagnosis of SARS-CoV-2 by FDA under an Emergency Use Authorization (EUA). This EUA will remain in effect (meaning this test can be used) for the duration of the COVID-19 declaration under Section 564(b)(1) of the Act, 21 U.S.C. section  360bbb-3(b)(1), unless the authorization is terminated or revoked.     Resp Syncytial Virus by PCR NEGATIVE NEGATIVE Final    Comment: (NOTE) Fact Sheet for Patients: BloggerCourse.com  Fact Sheet for Healthcare Providers: SeriousBroker.it  This test is not yet approved or cleared by the [place] FDA and has been authorized for detection and/or diagnosis of SARS-CoV-2 by FDA under an Emergency Use Authorization (EUA). This EUA will remain in effect (meaning this test can be used) for the duration of the COVID-19 declaration under Section 564(b)(1) of the Act, 21 U.S.C. section 360bbb-3(b)(1), unless the authorization is terminated or revoked.  Performed at [facility], [address] [place], [place] [address]   Blood Culture (routine x 2)     Status: Abnormal   Collection Time: 08/24/22 10:01 PM   Specimen: BLOOD  Result Value Ref Range Status   Specimen Description   Final    BLOOD BLOOD LEFT HAND Performed at [facility], [address], [place], [place] [address]    Special Requests   Final    BOTTLES DRAWN AEROBIC AND ANAEROBIC Blood Culture adequate volume Performed at [facility], [address] [place], [place] [address]    Culture  Setup Time   Final    GRAM POSITIVE COCCI IN BOTH AEROBIC AND ANAEROBIC BOTTLES Gram Stain Report Called to,Read Back By and Verified With: PIPKIN,C ON 08/25/22 AT 2110 BY LOY,C CRITICAL RESULT CALLED TO, READ BACK BY AND VERIFIED WITH: C PIPKINS,RN@[email or web address]  11/26/22 [facility] Performed at [facility], [address], [place], [place] [address]    Culture (A)  Final    AEROCOCCUS URINAE Standardized susceptibility testing for this organism is not available. Performed at [facility], [address], [place], [place] [address]    Report Status 08/29/2022 FINAL  Final  Blood Culture ID Panel (Reflexed)     Status: None   Collection Time: 08/24/22 10:01 PM  Result Value Ref Range Status    Enterococcus faecalis NOT DETECTED NOT DETECTED Final   Enterococcus Faecium NOT DETECTED NOT DETECTED Final   Listeria monocytogenes NOT DETECTED NOT DETECTED Final   Staphylococcus species NOT DETECTED NOT DETECTED Final   Staphylococcus aureus (BCID) NOT DETECTED NOT DETECTED Final   Staphylococcus epidermidis NOT DETECTED NOT DETECTED Final   Staphylococcus lugdunensis NOT DETECTED NOT DETECTED Final   Streptococcus species NOT DETECTED NOT DETECTED Final   Streptococcus agalactiae NOT DETECTED NOT DETECTED Final   Streptococcus pneumoniae NOT DETECTED NOT DETECTED Final   Streptococcus pyogenes NOT DETECTED NOT DETECTED Final   A.calcoaceticus-baumannii NOT DETECTED NOT DETECTED Final   Bacteroides fragilis NOT DETECTED NOT DETECTED Final   Enterobacterales NOT DETECTED NOT DETECTED Final   Enterobacter cloacae complex NOT DETECTED NOT DETECTED Final   Escherichia coli NOT DETECTED NOT DETECTED Final   Klebsiella aerogenes NOT DETECTED NOT DETECTED Final   Klebsiella oxytoca NOT DETECTED NOT DETECTED Final   Klebsiella pneumoniae NOT DETECTED NOT DETECTED Final   Proteus species NOT DETECTED NOT DETECTED Final   Salmonella species NOT DETECTED NOT DETECTED Final   Serratia marcescens NOT DETECTED NOT DETECTED Final   Haemophilus influenzae NOT DETECTED NOT  DETECTED Final   Neisseria meningitidis NOT DETECTED NOT DETECTED Final   Pseudomonas aeruginosa NOT DETECTED NOT DETECTED Final   Stenotrophomonas maltophilia NOT DETECTED NOT DETECTED Final   Candida albicans NOT DETECTED NOT DETECTED Final   Candida auris NOT DETECTED NOT DETECTED Final   Candida glabrata NOT DETECTED NOT DETECTED Final   Candida krusei NOT DETECTED NOT DETECTED Final   Candida parapsilosis NOT DETECTED NOT DETECTED Final   Candida tropicalis NOT DETECTED NOT DETECTED Final   Cryptococcus neoformans/gattii NOT DETECTED NOT DETECTED Final    Comment: Performed at [facility], [address]  [place], [place] [address]  Blood Culture (routine x 2)     Status: Abnormal   Collection Time: 08/24/22 10:02 PM   Specimen: BLOOD  Result Value Ref Range Status   Specimen Description   Final    BLOOD BLOOD LEFT WRIST Performed at [facility], [address], [place], [place] [address]    Special Requests   Final    BOTTLES DRAWN AEROBIC AND ANAEROBIC Blood Culture adequate volume Performed at [facility], [address] [place], [place] [address]    Culture  Setup Time   Final    GRAM POSITIVE COCCI AEROBIC BOTTLE ONLY CRITICAL VALUE NOTED.  VALUE IS CONSISTENT WITH PREVIOUSLY REPORTED AND CALLED VALUE. GRAM POSITIVE RODS ANAEROBIC BOTTLE ONLY Gram Stain Report Called to,Read Back By and Verified With: MADISON,GINGER @ [phone] ON 08/26/2022 BY FRATTO,ASHLEY Performed at [facility], [address], [place], [place] [address]    Culture (A)  Final    AEROCOCCUS URINAE GRAM POSITIVE RODS Actinotignum schaalii  Standardized susceptibility testing for this organism is not available. Performed at [facility], [address], [place], [place] [address]    Report Status 08/28/2022 FINAL  Final  Culture, blood (Routine X 2) w Reflex to ID Panel     Status: None   Collection Time: 08/26/22 11:50 AM   Specimen: BLOOD RIGHT HAND  Result Value Ref Range Status   Specimen Description BLOOD RIGHT HAND  Final   Special Requests   Final    BOTTLES DRAWN AEROBIC AND ANAEROBIC Blood Culture results may not be optimal due to an excessive volume of blood received in culture bottles   Culture   Final    NO GROWTH 5 DAYS Performed at [facility], [address] [place], [place] [address]    Report Status 08/31/2022 FINAL  Final  Culture, blood (Routine X 2) w Reflex to ID Panel     Status: None   Collection Time: 08/26/22 11:50 AM   Specimen: Right Antecubital; Blood  Result Value Ref Range Status   Specimen Description RIGHT ANTECUBITAL  Final   Special Requests   Final    BOTTLES DRAWN  AEROBIC AND ANAEROBIC Blood Culture adequate volume   Culture   Final    NO GROWTH 5 DAYS Performed at [facility], [address] [place], [place] [address]    Report Status 08/31/2022 FINAL  Final      Radiology Studies: MR FOOT RIGHT W WO CONTRAST  Result Date: 08/30/2022 CLINICAL DATA:  Soft tissue infection suspected. Ventral osteomyelitis. Follow-up from CT scan. Increasing lower extremity weakness and decreased ability to ambulate without assistance for about 4 weeks. No history of diabetes. EXAM: MRI OF THE RIGHT FOREFOOT WITHOUT AND WITH CONTRAST TECHNIQUE: Multiplanar, multisequence MR imaging of the right forefoot was performed before and after the administration of intravenous contrast. CONTRAST:  10mL GADAVIST  GADOBUTROL 1 MMOL/ML IV SOLN COMPARISON:  Right foot radiographs 08/24/2022 and CT right foot 08/27/2022 FINDINGS: Bones/Joint/Cartilage In the region of the soft tissue ulcer lateral to the fifth toe metatarsal head, there is moderate to high-grade lateral greater than medial 5th metatarsal head marrow edema and enhancement. There is also marrow edema and enhancement within the lateral greater than medial aspect of the base of the proximal phalanx of the fifth toe. Within the limitations of motion artifact, there appears to be partial thinning of the lateral cortex of the fifth metatarsal head and possibly the lateral base of the proximal phalanx. Mild hallux valgus with mild-to-moderate lateral greater than medial great toe metatarsophalangeal cartilage thinning and peripheral osteophytosis. Mild-to-moderate osteoarthritis of the tarsometatarsal joints diffusely. Ligaments The Lisfranc ligament complex is intact. The metatarsophalangeal and interphalangeal collateral ligaments appear grossly intact, within the limitation of the motion artifact. Muscles and Tendons No flexor or extensor tendon tear is seen. There is mild edema within the plantar midfoot flexor digitorum brevis  musculotendinous junction, nonspecific myositis (axial series 8 images 26 through 29). Soft tissues Redemonstration of soft tissue ulcer lateral to the great toe metatarsal head that appears to extend down to bone (axial series 8, image 19). IMPRESSION: 1. Redemonstration of soft tissue ulcer lateral to the great toe metatarsal head that appears to extend down to bone. Marrow edema enhancement within the lateral greater than medial aspects of the fifth metatarsal head and base of the proximal phalanx. Within the limitations of motion artifact, there appears to be partial thinning of the lateral cortex of the fifth metatarsal head and possibly the lateral base of the proximal phalanx. Findings are concerning for acute osteomyelitis. 2. Mild hallux valgus with mild-to-moderate great toe metatarsophalangeal cartilage thinning and peripheral osteophytosis. 3. Mild edema within the plantar midfoot flexor digitorum brevis musculotendinous junction, nonspecific myositis. Electronically Signed   By: Neita Garnet M.D.   On: 08/30/2022 16:32      LOS: 6 days    Jacquelin Hawking, MD [facility] 08/31/2022, 11:53 AM   If 7PM-7AM, please contact night-coverage [email or web address]

## 2022-08-31 NOTE — Progress Notes (Signed)
[facility]  Date of Admission:  08/24/2022      Total days of antibiotics 6   Unasyn 5/14 >> current   Ceftriaxone          ASSESSMENT: Jesse Pollock Sr. is a 89 y.o. male admitted from home for evaluation of generalized weakness, found to have polymicrobial bacteremia due to Aerococcus urinae and Actinotignum schaalii.   Bacteremia -  Penicillin is best for both these bacteria - tolerated challenge well and will change to unasyn. Most likely this was translocated from bladder source > foot infection.   Right Foot Ulceration -  MRI with read confirming suspicion for osteomyelitis. Evaluation with Dr. Lajoyce Corners pending.  I don't think this is the explanation of his bacteremia as that seems to be more likely translocation from GI source.   H/O Remote Penicillin Allergy -  > 20 years ago with non-severe reaction described. Tolerated amox challenge 5/13 well. Will change to Unasyn to treat organisms in the blood and out of consideration for chronic foot wound with osteomyelitis.    PLAN: Change to unasyn IV Follow for any delayed hypersensitivity rashes  Follow for Dr. Audrie Lia recommendations re: 5th toe OM in setting of chronic contiguous ulcer   Principal Problem:   Polymicrobial bacterial infection Active Problems:   Fever of undetermined origin   Lactic acidosis   Generalized weakness   Bronchitis   Bilateral pleural effusion   Right foot ulcer (HCC)   Essential hypertension   BPH (benign prostatic hyperplasia)   Paroxysmal atrial fibrillation (HCC)    cyanocobalamin  1,000 mcg Intramuscular Daily   dextromethorphan-guaiFENesin  1 tablet Oral BID   enoxaparin (LOVENOX) injection  40 mg Subcutaneous Q24H   furosemide  20 mg Intravenous Daily   leptospermum manuka honey  1 Application Topical Daily   multivitamin with minerals  1 tablet Oral Daily   pantoprazole  40 mg Oral Daily   tamsulosin  0.4 mg Oral QPC supper     SUBJECTIVE: Doing OK today. Thinks he did well with the amoxicillin abx. His family is at the bedside again.    Review of Systems: Review of Systems  Constitutional:  Negative for chills and fever.  Genitourinary:  Negative for dysuria.  Skin:  Negative for itching and rash.    Allergies  Allergen Reactions   Codeine Swelling    OBJECTIVE: Vitals:   08/30/22 1526 08/30/22 2045 08/31/22 0523 08/31/22 0738  BP: (!) 192/76 (!) 142/58 (!) 140/61 137/66  Pulse: 60 (!) 57 (!) 49 (!) 58  Resp: 16   16  Temp: 97.7 F (36.5 C) 98.3 F (36.8 C) 98.3 F (36.8 C) 98 F (36.7 C)  TempSrc: Oral Oral Oral Oral  SpO2: 100% 100% 100% 100%  Weight:      Height:       Body mass index is 28.35 kg/m.  Physical Exam Vitals reviewed.  Constitutional:      Appearance: Normal appearance. He is not ill-appearing.  Cardiovascular:     Rate and Rhythm: Normal rate and regular rhythm.  Pulmonary:     Effort: Pulmonary effort is normal.     Breath sounds: Normal breath sounds.  Abdominal:     General: There is no distension.     Palpations: Abdomen is soft.  Skin:    General: Skin is warm and dry.     Findings: No rash.     Lab Results Lab Results  Component Value Date   WBC 8.1 08/29/2022   HGB 8.7 (L) 08/29/2022   HCT 26.4 (L) 08/29/2022   MCV 101.9 (H) 08/29/2022   PLT 241 08/29/2022    Lab Results  Component Value Date   CREATININE 0.99 08/29/2022   BUN 21 08/29/2022   NA 134 (L) 08/29/2022   K 4.0 08/29/2022   CL 99 08/29/2022   CO2 25 08/29/2022    Lab Results  Component Value Date   ALT 18 08/27/2022   AST 33 08/27/2022   ALKPHOS 46 08/27/2022   BILITOT 0.8 08/27/2022     Microbiology: Recent Results (from the past 240 hour(s))  Urine Culture (for pregnant, neutropenic or urologic patients or patients with an indwelling urinary catheter)     Status: Abnormal   Collection Time: 08/24/22  4:50 PM   Specimen: Urine, Clean Catch  Result Value Ref Range  Status   Specimen Description   Final    URINE, CLEAN CATCH Performed at [facility], [address], [place], [place] [address]    Special Requests   Final    NONE Performed at [facility], [address], [place], [place] [address]    Culture (A)  Final    >=100,000 COLONIES/mL AEROCOCCUS SPECIES Standardized susceptibility testing for this organism is not available. Performed at [facility], [address] [place], [place] [address]    Report Status 08/27/2022 FINAL  Final  Resp panel by RT-PCR (RSV, Flu A&B, Covid) Anterior Nasal Swab     Status: None   Collection Time: 08/24/22  9:55 PM   Specimen: Anterior Nasal Swab  Result Value Ref Range Status   SARS Coronavirus 2 by RT PCR NEGATIVE NEGATIVE Final    Comment: (NOTE) SARS-CoV-2 target nucleic acids are NOT DETECTED.  The SARS-CoV-2 RNA is generally detectable in upper respiratory specimens during the acute phase of infection. The lowest concentration of SARS-CoV-2 viral copies this assay can detect is 138 copies/mL. A negative result does not preclude SARS-Cov-2 infection and should not be used as the sole basis for treatment or other patient management decisions. A negative result may occur with  improper specimen collection/handling, submission of specimen other than nasopharyngeal swab, presence of viral mutation(s) within the areas targeted by this assay, and inadequate number of viral copies(<138 copies/mL). A negative result must be combined with clinical observations, patient history, and epidemiological information. The expected result is Negative.  Fact Sheet for Patients:  BloggerCourse.com  Fact Sheet for Healthcare Providers:  SeriousBroker.it  This test is no t yet approved or cleared by the [place] FDA and  has been authorized for detection and/or diagnosis of SARS-CoV-2 by FDA under an Emergency Use Authorization (EUA). This EUA will  remain  in effect (meaning this test can be used) for the duration of the COVID-19 declaration under Section 564(b)(1) of the Act, 21 U.S.C.section 360bbb-3(b)(1), unless the authorization is terminated  or revoked sooner.       Influenza A by PCR NEGATIVE NEGATIVE Final   Influenza B by PCR NEGATIVE NEGATIVE Final    Comment: (NOTE) The Xpert Xpress SARS-CoV-2/FLU/RSV plus assay is intended as an aid in the diagnosis of influenza from Nasopharyngeal swab specimens and should not be used as a sole basis for treatment. Nasal washings and aspirates are unacceptable for Xpert Xpress SARS-CoV-2/FLU/RSV testing.  Fact Sheet for Patients: BloggerCourse.com  Fact Sheet for Healthcare Providers: SeriousBroker.it  This test is not yet approved or cleared by the [place]  FDA and has been authorized for detection and/or diagnosis of SARS-CoV-2 by FDA under an Emergency Use Authorization (EUA). This EUA will remain in effect (meaning this test can be used) for the duration of the COVID-19 declaration under Section 564(b)(1) of the Act, 21 U.S.C. section 360bbb-3(b)(1), unless the authorization is terminated or revoked.     Resp Syncytial Virus by PCR NEGATIVE NEGATIVE Final    Comment: (NOTE) Fact Sheet for Patients: BloggerCourse.com  Fact Sheet for Healthcare Providers: SeriousBroker.it  This test is not yet approved or cleared by the [place] FDA and has been authorized for detection and/or diagnosis of SARS-CoV-2 by FDA under an Emergency Use Authorization (EUA). This EUA will remain in effect (meaning this test can be used) for the duration of the COVID-19 declaration under Section 564(b)(1) of the Act, 21 U.S.C. section 360bbb-3(b)(1), unless the authorization is terminated or revoked.  Performed at [facility], [address], [place], [place] [address]   Blood  Culture (routine x 2)     Status: Abnormal   Collection Time: 08/24/22 10:01 PM   Specimen: BLOOD  Result Value Ref Range Status   Specimen Description   Final    BLOOD BLOOD LEFT HAND Performed at [facility], [address] [place], [place] [address]    Special Requests   Final    BOTTLES DRAWN AEROBIC AND ANAEROBIC Blood Culture adequate volume Performed at [facility], [address], [place], [place] [address]    Culture  Setup Time   Final    GRAM POSITIVE COCCI IN BOTH AEROBIC AND ANAEROBIC BOTTLES Gram Stain Report Called to,Read Back By and Verified With: PIPKIN,C ON 08/25/22 AT 2110 BY LOY,C CRITICAL RESULT CALLED TO, READ BACK BY AND VERIFIED WITH: C PIPKINS,RN@[id number]  11/26/22 [facility] Performed at [facility], [address] [place], [place] [address]    Culture (A)  Final    AEROCOCCUS URINAE Standardized susceptibility testing for this organism is not available. Performed at [facility], [address] [place], [place] [address]    Report Status 08/29/2022 FINAL  Final  Blood Culture ID Panel (Reflexed)     Status: None   Collection Time: 08/24/22 10:01 PM  Result Value Ref Range Status   Enterococcus faecalis NOT DETECTED NOT DETECTED Final   Enterococcus Faecium NOT DETECTED NOT DETECTED Final   Listeria monocytogenes NOT DETECTED NOT DETECTED Final   Staphylococcus species NOT DETECTED NOT DETECTED Final   Staphylococcus aureus (BCID) NOT DETECTED NOT DETECTED Final   Staphylococcus epidermidis NOT DETECTED NOT DETECTED Final   Staphylococcus lugdunensis NOT DETECTED NOT DETECTED Final   Streptococcus species NOT DETECTED NOT DETECTED Final   Streptococcus agalactiae NOT DETECTED NOT DETECTED Final   Streptococcus pneumoniae NOT DETECTED NOT DETECTED Final   Streptococcus pyogenes NOT DETECTED NOT DETECTED Final   A.calcoaceticus-baumannii NOT DETECTED NOT DETECTED Final   Bacteroides fragilis NOT DETECTED NOT DETECTED Final   Enterobacterales NOT DETECTED  NOT DETECTED Final   Enterobacter cloacae complex NOT DETECTED NOT DETECTED Final   Escherichia coli NOT DETECTED NOT DETECTED Final   Klebsiella aerogenes NOT DETECTED NOT DETECTED Final   Klebsiella oxytoca NOT DETECTED NOT DETECTED Final   Klebsiella pneumoniae NOT DETECTED NOT DETECTED Final   Proteus species NOT DETECTED NOT DETECTED Final   Salmonella species NOT DETECTED NOT DETECTED Final   Serratia marcescens NOT DETECTED NOT DETECTED Final   Haemophilus influenzae NOT DETECTED NOT DETECTED Final   Neisseria meningitidis NOT DETECTED NOT DETECTED Final  Pseudomonas aeruginosa NOT DETECTED NOT DETECTED Final   Stenotrophomonas maltophilia NOT DETECTED NOT DETECTED Final   Candida albicans NOT DETECTED NOT DETECTED Final   Candida auris NOT DETECTED NOT DETECTED Final   Candida glabrata NOT DETECTED NOT DETECTED Final   Candida krusei NOT DETECTED NOT DETECTED Final   Candida parapsilosis NOT DETECTED NOT DETECTED Final   Candida tropicalis NOT DETECTED NOT DETECTED Final   Cryptococcus neoformans/gattii NOT DETECTED NOT DETECTED Final    Comment: Performed at [facility], [address], [place], [place] [address]  Blood Culture (routine x 2)     Status: Abnormal   Collection Time: 08/24/22 10:02 PM   Specimen: BLOOD  Result Value Ref Range Status   Specimen Description   Final    BLOOD BLOOD LEFT WRIST Performed at [facility], [address] [place], [place] [address]    Special Requests   Final    BOTTLES DRAWN AEROBIC AND ANAEROBIC Blood Culture adequate volume Performed at [facility], [address] [place], [place] [address]    Culture  Setup Time   Final    GRAM POSITIVE COCCI AEROBIC BOTTLE ONLY CRITICAL VALUE NOTED.  VALUE IS CONSISTENT WITH PREVIOUSLY REPORTED AND CALLED VALUE. GRAM POSITIVE RODS ANAEROBIC BOTTLE ONLY Gram Stain Report Called to,Read Back By and Verified With: MADISON,GINGER @ [phone] ON 08/26/2022 BY FRATTO,ASHLEY Performed at [facility], [address], [place], [place] [address]    Culture (A)  Final    AEROCOCCUS URINAE GRAM POSITIVE RODS Actinotignum schaalii  Standardized susceptibility testing for this organism is not available. Performed at [facility], [address], [place], [place] [address]    Report Status 08/28/2022 FINAL  Final  Culture, blood (Routine X 2) w Reflex to ID Panel     Status: None   Collection Time: 08/26/22 11:50 AM   Specimen: BLOOD RIGHT HAND  Result Value Ref Range Status   Specimen Description BLOOD RIGHT HAND  Final   Special Requests   Final    BOTTLES DRAWN AEROBIC AND ANAEROBIC Blood Culture results may not be optimal due to an excessive volume of blood received in culture bottles   Culture   Final    NO GROWTH 5 DAYS Performed at [facility], [address] [place], [place] [address]    Report Status 08/31/2022 FINAL  Final  Culture, blood (Routine X 2) w Reflex to ID Panel     Status: None   Collection Time: 08/26/22 11:50 AM   Specimen: Right Antecubital; Blood  Result Value Ref Range Status   Specimen Description RIGHT ANTECUBITAL  Final   Special Requests   Final    BOTTLES DRAWN AEROBIC AND ANAEROBIC Blood Culture adequate volume   Culture   Final    NO GROWTH 5 DAYS Performed at [facility], [address] [place], [place] [address]    Report Status 08/31/2022 FINAL  Final     Rexene Alberts, MSN, NP-C [facility]  [email or web address] Pager: [phone] Office: [phone] [facility] Main Line: [phone] *Secure Chat Communication Welcome   Total Encounter Time: 25 min

## 2022-08-31 NOTE — Progress Notes (Signed)
Occupational Therapy Treatment Patient Details Name: [name] Sr. MRN: [id number] DOB: March 15, 1929 Today's Date: 08/31/2022   History of present illness 89 y.o. male [facility] due to generalized progressive weakness. 5/8 found to be febrile with leukocytosis, started on antibiotics for sepsis with unclear source. 5/9 Transferred to [facility] hospital found to have gram-positive cocci bacteremia with possible R foot osteomyelitis. PMH includes hypertension, BPH, prostate cancer   OT comments  Pt motivated to participate, limited by general weakness. Pt attempted to sit at EOB today x1 assist, unable to due to severe posterior lean, weakness, total A for BLE assist. Pt able to perform rolling L/R with mod A, assistance for BLEs, performed core/leg exercises to improve overall bed mobility and functional strength/independence. Pt has limited AROM with core/BLEs. Pt would benefit greatly from conitnued skilled therapy to improve functional strength.   Recommendations for follow up therapy are one component of a multi-disciplinary discharge planning process, led by the attending physician.  Recommendations may be updated based on patient status, additional functional criteria and insurance authorization.    Assistance Recommended at Discharge Frequent or constant Supervision/Assistance  Patient can return home with the following  A lot of help with walking and/or transfers;A lot of help with bathing/dressing/bathroom;Assistance with cooking/housework;Assist for transportation;Help with stairs or ramp for entrance   Equipment Recommendations  None recommended by OT    Recommendations for Other Services      Precautions / Restrictions Precautions Precautions: Fall Restrictions Weight Bearing Restrictions: No       Mobility Bed Mobility Overal bed mobility: Needs Assistance Bed Mobility: Rolling, Supine to Sit, Sit to Supine Rolling: Mod assist   Supine to sit: Max  assist, HOB elevated Sit to supine: HOB elevated, +2 for physical assistance, Total assist        Transfers                         Balance   Sitting-balance support: Bilateral upper extremity supported, Feet supported Sitting balance-Leahy Scale: Poor Sitting balance - Comments: attempted to sit at EOB with 1 person assist, Pt has posterior lean, total A for sitting up and BLE support.                                   ADL either performed or assessed with clinical judgement   ADL                                              Extremity/Trunk Assessment Upper Extremity Assessment Upper Extremity Assessment: Generalized weakness   Lower Extremity Assessment Lower Extremity Assessment: Generalized weakness        Vision       Perception     Praxis      Cognition Arousal/Alertness: Awake/alert Behavior During Therapy: WFL for tasks assessed/performed Overall Cognitive Status: Within Functional Limits for tasks assessed                                 General Comments: Pt is HOH, able to follow commands WFLs, due to general weakness and stiffness Pt has difficulty with movements        Exercises Exercises: Other exercises Other Exercises Other  Exercises: BLE exercises to encourage bed mobility, knee flexion, leg raises, plantar/dorsiflexion 4X15 Other Exercises: bed mobility, rolling L to R, mod A, using bed rails, unable to carry legs over during rolls. 3X15    Shoulder Instructions       General Comments      Pertinent Vitals/ Pain       Pain Assessment Pain Assessment: No/denies pain  Home Living                                          Prior Functioning/Environment              Frequency  Min 2X/week        Progress Toward Goals  OT Goals(current goals can now be found in the care plan section)  Progress towards OT goals: Progressing toward goals  Acute Rehab  OT Goals Patient Stated Goal: to imrpove strength and functional independence OT Goal Formulation: With patient Time For Goal Achievement: 09/08/22 Potential to Achieve Goals: Good ADL Goals Pt Will Perform Grooming: with modified independence;sitting Pt Will Perform Upper Body Dressing: with supervision;sitting Pt Will Perform Lower Body Dressing: with mod assist;sitting/lateral leans;with adaptive equipment Pt Will Transfer to Toilet: with min guard assist;squat pivot transfer Pt/caregiver will Perform Home Exercise Program: Increased strength;Both right and left upper extremity;Independently  Plan Discharge plan remains appropriate    Co-evaluation                 AM-PAC OT "6 Clicks" Daily Activity     Outcome Measure   Help from another person eating meals?: A Little Help from another person taking care of personal grooming?: A Lot Help from another person toileting, which includes using toliet, bedpan, or urinal?: A Lot Help from another person bathing (including washing, rinsing, drying)?: Total Help from another person to put on and taking off regular upper body clothing?: A Lot Help from another person to put on and taking off regular lower body clothing?: Total 6 Click Score: 11    End of Session    OT Visit Diagnosis: Unsteadiness on feet (R26.81);Other abnormalities of gait and mobility (R26.89);Muscle weakness (generalized) (M62.81)   Activity Tolerance Patient limited by fatigue   Patient Left in bed;with bed alarm set;with call bell/phone within reach   Nurse Communication Mobility status        Time: 6962-9528 OT Time Calculation (min): 30 min  Charges: OT General Charges $OT Visit: 1 Visit OT Treatments $Therapeutic Activity: 8-22 mins $Therapeutic Exercise: 8-22 mins  Channel Papandrea, OTR/L   Alexis Goodell 08/31/2022, 3:14 PM

## 2022-09-01 DIAGNOSIS — J90 Pleural effusion, not elsewhere classified: Secondary | ICD-10-CM | POA: Diagnosis not present

## 2022-09-01 DIAGNOSIS — I739 Peripheral vascular disease, unspecified: Secondary | ICD-10-CM | POA: Diagnosis not present

## 2022-09-01 DIAGNOSIS — M869 Osteomyelitis, unspecified: Secondary | ICD-10-CM | POA: Diagnosis not present

## 2022-09-01 DIAGNOSIS — I96 Gangrene, not elsewhere classified: Secondary | ICD-10-CM

## 2022-09-01 DIAGNOSIS — A499 Bacterial infection, unspecified: Secondary | ICD-10-CM | POA: Diagnosis not present

## 2022-09-01 DIAGNOSIS — L97514 Non-pressure chronic ulcer of other part of right foot with necrosis of bone: Secondary | ICD-10-CM | POA: Diagnosis not present

## 2022-09-01 DIAGNOSIS — N3000 Acute cystitis without hematuria: Secondary | ICD-10-CM | POA: Diagnosis not present

## 2022-09-01 NOTE — Consult Note (Addendum)
[name] NOTE   MRN : [id number]  Reason for Consult: right LE CLI with non healing 5th toe ulcer Referring Physician: Dr. Lajoyce Corners  History of Present Illness: 89 y/o male with a necrotic ulcer fifth metatarsal head right foot with underlying osteomyelitis. He ambulates with a walker at home.  He presented to the [facility] ED for generalized weakness and was found to have a right 5th MTP head ulcer.  We have been consulted for arterial insufficiency.  The ABI on the right LE demonstrates 0.65 monophasic wave forms with toe pressure of 45 mmhg.      Past medical history includes: hypertension, BPH, prostate cancer.  He is medically managed on a daily ASA.     Current Facility-Administered Medications  Medication Dose Route Frequency Provider Last Rate Last Admin   acetaminophen (TYLENOL) tablet 650 mg  650 mg Oral Q6H PRN Adefeso, Oladapo, DO       Or   acetaminophen (TYLENOL) suppository 650 mg  650 mg Rectal Q6H PRN Adefeso, Oladapo, DO       Ampicillin-Sulbactam (UNASYN) 3 g in sodium chloride 0.9 % 100 mL IVPB  3 g Intravenous Q6H Daiva Eves, Lisette Grinder, MD 200 mL/hr at 09/01/22 0639 3 g at 09/01/22 0160   cyanocobalamin (VITAMIN B12) injection 1,000 mcg  1,000 mcg Intramuscular Daily Tyrone Nine, MD   1,000 mcg at 08/31/22 0947   dextromethorphan-guaiFENesin (MUCINEX DM) 30-600 MG per 12 hr tablet 1 tablet  1 tablet Oral BID Adefeso, Oladapo, DO   1 tablet at 08/31/22 2243   diphenhydrAMINE (BENADRYL) injection 25 mg  25 mg Intravenous Once PRN Daiva Eves, Lisette Grinder, MD       enoxaparin (LOVENOX) injection 40 mg  40 mg Subcutaneous Q24H Adefeso, Oladapo, DO   40 mg at 08/31/22 1093   EPINEPHrine (EPI-PEN) injection 0.3 mg  0.3 mg Intramuscular Once PRN Daiva Eves, Lisette Grinder, MD       furosemide (LASIX) injection 20 mg  20 mg Intravenous Daily Adefeso, Oladapo, DO   20 mg at 08/31/22 2355   hydrALAZINE (APRESOLINE) injection 10 mg  10 mg Intravenous Q6H PRN  Narda Bonds, MD       ipratropium-albuterol (DUONEB) 0.5-2.5 (3) MG/3ML nebulizer solution 3 mL  3 mL Nebulization Q4H PRN Adefeso, Oladapo, DO       leptospermum manuka honey (MEDIHONEY) paste 1 Application  1 Application Topical Daily Tyrone Nine, MD   1 Application at 08/31/22 0941   multivitamin with minerals tablet 1 tablet  1 tablet Oral Daily Tyrone Nine, MD   1 tablet at 08/31/22 0946   ondansetron (ZOFRAN) tablet 4 mg  4 mg Oral Q6H PRN Frankey Shown, DO       Or   ondansetron (ZOFRAN) injection 4 mg  4 mg Intravenous Q6H PRN Adefeso, Oladapo, DO   4 mg at 08/27/22 2053   pantoprazole (PROTONIX) EC tablet 40 mg  40 mg Oral Daily Adefeso, Oladapo, DO   40 mg at 08/31/22 7322   tamsulosin (FLOMAX) capsule 0.4 mg  0.4 mg Oral QPC supper Adefeso, Oladapo, DO   0.4 mg at 08/31/22 1723    Pt meds include: Statin :No Betablocker: No ASA: Yes Other anticoagulants/antiplatelets: none  Past Medical History:  Diagnosis Date   Hypertension    Kidney calculus 2014;2013   Pneumonia    Prostate cancer (HCC)    Shortness of breath     Past Surgical History:  Procedure Laterality Date   CATARACT EXTRACTION W/PHACO Right 02/13/2013   Procedure: CATARACT EXTRACTION RIGHT EYE (WITH PHACO) AND INTRAOCULAR LENS PLACEMENT  CDE=14.53;  Surgeon: Loraine Leriche T. Nile Riggs, MD;  Location: [facility];  Service: Ophthalmology;  Laterality: Right;   CATARACT EXTRACTION W/PHACO Left 02/27/2013   Procedure: CATARACT EXTRACTION PHACO AND INTRAOCULAR LENS PLACEMENT (IOC);  Surgeon: Loraine Leriche T. Nile Riggs, MD;  Location: [facility];  Service: Ophthalmology;  Laterality: Left;  CDE:12.54   COLONOSCOPY     JOINT REPLACEMENT Right 1993;1980   Right x2    Social History Social History   Tobacco Use   Smoking status: Never   Smokeless tobacco: Never  Vaping Use   Vaping Use: Never used  Substance Use Topics   Alcohol use: No   Drug use: No    Family History History reviewed. No pertinent family  history.  Allergies  Allergen Reactions   Codeine Swelling     REVIEW OF SYSTEMS  General: [ ]  Weight loss, [ ]  Fever, [ ]  chills Neurologic: [ ]  Dizziness, [ ]  Blackouts, [ ]  Seizure [ ]  Stroke, [ ]  "Mini stroke", [ ]  Slurred speech, [ ]  Temporary blindness; [ x] weakness in arms or legs, [ ]  Hoarseness [ ]  Dysphagia Cardiac: [ ]  Chest pain/pressure, [ ]  Shortness of breath at rest [ ]  Shortness of breath with exertion, [ ]  Atrial fibrillation or irregular heartbeat  Vascular: [ ]  Pain in legs with walking, [ ]  Pain in legs at rest, [ ]  Pain in legs at night,  [ x] Non-healing ulcer, [ ]  Blood clot in vein/DVT,   Pulmonary: [ ]  Home oxygen, [ ]  Productive cough, [ ]  Coughing up blood, [ ]  Asthma,  [ ]  Wheezing [ ]  COPD Musculoskeletal:  [ ]  Arthritis, [ ]  Low back pain, [ x] Joint pain Hematologic: [ ]  Easy Bruising, [ x] Anemia; [ ]  Hepatitis Gastrointestinal: [ ]  Blood in stool, [ ]  Gastroesophageal Reflux/heartburn, Urinary: [ ]  chronic Kidney disease, [ ]  on HD - [ ]  MWF or [ ]  TTHS, [ ]  Burning with urination, [ ]  Difficulty urinating Skin: [ ]  Rashes, [ ]  Wounds Psychological: [ ]  Anxiety, [ ]  Depression  Physical Examination Vitals:   08/31/22 0738 08/31/22 1603 08/31/22 2121 09/01/22 0611  BP: 137/66 (!) 142/53 (!) 142/49 (!) 147/60  Pulse: (!) 58 (!) 56 (!) 54 (!) 108  Resp: 16 16    Temp: 98 F (36.7 C) 98.3 F (36.8 C) 98.2 F (36.8 C) 97.6 F (36.4 C)  TempSrc: Oral Oral Oral   SpO2: 100% 100% 100% 100%  Weight:      Height:       Body mass index is 28.35 kg/m.  General:  WDWN in NAD HENT: WNL Eyes: Pupils equal Pulmonary: normal non-labored breathing , without Rales, rhonchi,  wheezing Cardiac: RRR, without  Murmurs, rubs or gallops; No carotid bruits Abdomen: soft, NT, no masses Skin:   Vascular Exam/Pulses:palpable left femoral pulse, left LE warm I was not able to palpate pedal pulses B.     Musculoskeletal: no muscle wasting or atrophy;  minimal B LE edema  Neurologic: A&O X 3; Appropriate Affect ;  SENSATION: normal; MOTOR FUNCTION: foot and ankle motor intact B LE Symmetric Speech is fluent/normal   Significant Diagnostic Studies: CBC Lab Results  Component Value Date   WBC 7.3 08/31/2022   HGB 8.9 (L) 08/31/2022   HCT 28.1 (L) 08/31/2022   MCV 103.3 (H) 08/31/2022   PLT 277  08/31/2022    BMET    Component Value Date/Time   NA 134 (L) 08/29/2022 0031   K 4.0 08/29/2022 0031   CL 99 08/29/2022 0031   CO2 25 08/29/2022 0031   GLUCOSE 119 (H) 08/29/2022 0031   BUN 21 08/29/2022 0031   CREATININE 0.99 08/29/2022 0031   CALCIUM 8.6 (L) 08/29/2022 0031   GFRNONAA >60 08/29/2022 0031   GFRAA 55 (L) 07/24/2019 1456   Estimated Creatinine Clearance: 48.8 mL/min (by C-G formula based on SCr of 0.99 mg/dL).  COAG No results found for: "INR", "PROTIME"   Non-Invasive Vascular Imaging:   ABI Findings:  +---------+------------------+-----+-----------+--------+  Right   Rt Pressure (mmHg)IndexWaveform   Comment   +---------+------------------+-----+-----------+--------+  Brachial 150                    multiphasic          +---------+------------------+-----+-----------+--------+  PTA     105               0.62 monophasic           +---------+------------------+-----+-----------+--------+  DP      109               0.65 monophasic           +---------+------------------+-----+-----------+--------+  Great Toe45                0.27 Abnormal             +---------+------------------+-----+-----------+--------+   +---------+------------------+-----+-----------+-------+  Left    Lt Pressure (mmHg)IndexWaveform   Comment  +---------+------------------+-----+-----------+-------+  Brachial 168                    multiphasic         +---------+------------------+-----+-----------+-------+  PTA     186               1.11 multiphasic          +---------+------------------+-----+-----------+-------+  DP      185               1.10 multiphasic         +---------+------------------+-----+-----------+-------+  Great Toe102               0.61 Abnormal            +---------+------------------+-----+-----------+-------+   +-------+-----------+-----------+------------+------------+  ABI/TBIToday's ABIToday's TBIPrevious ABIPrevious TBI  +-------+-----------+-----------+------------+------------+  Right 0.65       0.27                                 +-------+-----------+-----------+------------+------------+  Left  1.11       0.61                                 +-------+-----------+-----------+------------+------------+       Summary:  Right: Resting right ankle-brachial index indicates moderate right lower  extremity arterial disease. The right toe-brachial index is abnormal.   Left: Resting left ankle-brachial index is within normal range. The left  toe-brachial index is abnormal.    ASSESSMENT/PLAN:  PAD with CLI  The ABI on the right LE demonstrates 0.65 monophasic wave forms with toe pressure of 45 mmhg.   He does not have a right LE femoral pulses or pedal pulses with tissue loss and osteomyelitis.  The foot feels warm to palpation.  He  does have a palpable left femoral pulse.    He would benefit from angiogram with iliac, femoral/SFA popliteal and tibial intervention to improve his arterial inflow to assist in healing of the amputation planned by Dr. Lajoyce Corners.     Mosetta Pigeon 09/01/2022 8:23 AM  I agree with the above.  I have seen and evaluated the patient.  Briefly this is a 89 year old gentleman with a necrotic right fifth metatarsal head ulcer with underlying osteomyelitis.  This is being managed by Dr. Lajoyce Corners.  We are asked to evaluate his blood flow.  ABIs are 0.65 on the right with monophasic waveforms.  His toe pressure is 45.  On exam, I was unable to palpate a right  femoral pulse but he has a 2+ left femoral pulse.  I had a lengthy discussion with the patient's family who is at the bedside.  I discussed proceeding with angiography with runoff via a left femoral approach and intervention if indicated.  I discussed the risks of embolization and bleeding.  We also discussed that we would do everything we can to treat him from an endovascular approach, given his age and comorbidities.  We will proceed tomorrow by Dr. Chestine Spore.  He will be n.p.o. after midnight.  I did discuss that this is a limb threatening situation  Durene Cal

## 2022-09-01 NOTE — Progress Notes (Addendum)
[facility]  Date of Admission:  08/24/2022      Total days of antibiotics 7   Unasyn 5/14 >> 5/15   Ceftriaxone           ASSESSMENT: Jeffie Pollock Sr. is a 89 y.o. male admitted from home for evaluation of generalized weakness, found to have polymicrobial bacteremia due to Aerococcus urinae and Actinotignum schaalii.    Bacteremia -  Penicillin is best for both these bacteria - tolerated challenge well and will change to unasyn. Most likely this was translocated from bladder source given organism growing in urine as well. He has been well treated for this.   Right Foot Ulceration -  MRI with read confirming suspicion for osteomyelitis. PAD concerning for wound healing with ABI > 0.7. Planned angiogram and possible intervention 5/16 with vascular team prior to consideration for any amputation treatment.    H/O Remote Penicillin Allergy -  > 20 years ago with non-severe reaction described. Tolerated amox challenge 5/13 well. Did well with Unasyn w/o concern for any effects.     PLAN: Stop antibiotics in preparation for amputation to have higher yield on cultures  FU after vascular assessment tomorrow (scheduled 5/16 @ 1pm)    Principal Problem:   Polymicrobial bacterial infection Active Problems:   Fever of undetermined origin   Lactic acidosis   Generalized weakness   Bronchitis   Bilateral pleural effusion   Right foot ulcer (HCC)   Essential hypertension   BPH (benign prostatic hyperplasia)   Paroxysmal atrial fibrillation (HCC)   Osteomyelitis of fifth toe of right foot (HCC)   Bacteremia   Acute cystitis without hematuria   PVD (peripheral vascular disease) (HCC)    cyanocobalamin  1,000 mcg Intramuscular Daily   dextromethorphan-guaiFENesin  1 tablet Oral BID   enoxaparin (LOVENOX) injection  40 mg Subcutaneous Q24H   furosemide  20 mg Intravenous Daily   leptospermum manuka honey  1 Application Topical Daily    multivitamin with minerals  1 tablet Oral Daily   pantoprazole  40 mg Oral Daily   tamsulosin  0.4 mg Oral QPC supper    SUBJECTIVE: Doing OK today. We reviewed    Review of Systems: Review of Systems  Constitutional:  Negative for chills and fever.  Genitourinary:  Negative for dysuria.  Skin:  Negative for itching and rash.    Allergies  Allergen Reactions   Codeine Swelling    OBJECTIVE: Vitals:   08/31/22 1603 08/31/22 2121 09/01/22 0611 09/01/22 0838  BP: (!) 142/53 (!) 142/49 (!) 147/60 (!) 129/54  Pulse: (!) 56 (!) 54 (!) 108 (!) 59  Resp: 16   17  Temp: 98.3 F (36.8 C) 98.2 F (36.8 C) 97.6 F (36.4 C) 97.8 F (36.6 C)  TempSrc: Oral Oral  Oral  SpO2: 100% 100% 100% 100%  Weight:      Height:       Body mass index is 28.35 kg/m.  Physical Exam Vitals reviewed.  Constitutional:      Appearance: Normal appearance. He is not ill-appearing.  Cardiovascular:     Rate and Rhythm: Normal rate and regular rhythm.  Pulmonary:     Effort: Pulmonary effort is normal.     Breath sounds: Normal breath sounds.  Abdominal:     General: There is no distension.     Palpations: Abdomen is soft.  Skin:    General: Skin is warm and dry.  Findings: No rash.     Lab Results Lab Results  Component Value Date   WBC 7.3 08/31/2022   HGB 8.9 (L) 08/31/2022   HCT 28.1 (L) 08/31/2022   MCV 103.3 (H) 08/31/2022   PLT 277 08/31/2022    Lab Results  Component Value Date   CREATININE 0.99 08/29/2022   BUN 21 08/29/2022   NA 134 (L) 08/29/2022   K 4.0 08/29/2022   CL 99 08/29/2022   CO2 25 08/29/2022    Lab Results  Component Value Date   ALT 18 08/27/2022   AST 33 08/27/2022   ALKPHOS 46 08/27/2022   BILITOT 0.8 08/27/2022     Microbiology: Recent Results (from the past 240 hour(s))  Urine Culture (for pregnant, neutropenic or urologic patients or patients with an indwelling urinary catheter)     Status: Abnormal   Collection Time: 08/24/22  4:50 PM    Specimen: Urine, Clean Catch  Result Value Ref Range Status   Specimen Description   Final    URINE, CLEAN CATCH Performed at [facility], [address], [place], [place] [address]    Special Requests   Final    NONE Performed at [facility], [address] [place], [place] [address]    Culture (A)  Final    >=100,000 COLONIES/mL AEROCOCCUS SPECIES Standardized susceptibility testing for this organism is not available. Performed at [facility], [address], [place], [place] [address]    Report Status 08/27/2022 FINAL  Final  Resp panel by RT-PCR (RSV, Flu A&B, Covid) Anterior Nasal Swab     Status: None   Collection Time: 08/24/22  9:55 PM   Specimen: Anterior Nasal Swab  Result Value Ref Range Status   SARS Coronavirus 2 by RT PCR NEGATIVE NEGATIVE Final    Comment: (NOTE) SARS-CoV-2 target nucleic acids are NOT DETECTED.  The SARS-CoV-2 RNA is generally detectable in upper respiratory specimens during the acute phase of infection. The lowest concentration of SARS-CoV-2 viral copies this assay can detect is 138 copies/mL. A negative result does not preclude SARS-Cov-2 infection and should not be used as the sole basis for treatment or other patient management decisions. A negative result may occur with  improper specimen collection/handling, submission of specimen other than nasopharyngeal swab, presence of viral mutation(s) within the areas targeted by this assay, and inadequate number of viral copies(<138 copies/mL). A negative result must be combined with clinical observations, patient history, and epidemiological information. The expected result is Negative.  Fact Sheet for Patients:  BloggerCourse.com  Fact Sheet for Healthcare Providers:  SeriousBroker.it  This test is no t yet approved or cleared by the [place] FDA and  has been authorized for detection and/or diagnosis of SARS-CoV-2 by FDA  under an Emergency Use Authorization (EUA). This EUA will remain  in effect (meaning this test can be used) for the duration of the COVID-19 declaration under Section 564(b)(1) of the Act, 21 U.S.C.section 360bbb-3(b)(1), unless the authorization is terminated  or revoked sooner.       Influenza A by PCR NEGATIVE NEGATIVE Final   Influenza B by PCR NEGATIVE NEGATIVE Final    Comment: (NOTE) The Xpert Xpress SARS-CoV-2/FLU/RSV plus assay is intended as an aid in the diagnosis of influenza from Nasopharyngeal swab specimens and should not be used as a sole basis for treatment. Nasal washings and aspirates are unacceptable for Xpert Xpress SARS-CoV-2/FLU/RSV testing.  Fact Sheet for Patients: BloggerCourse.com  Fact Sheet for Healthcare Providers: SeriousBroker.it  This test is not yet approved or cleared by the [place] and has been authorized for detection and/or diagnosis of SARS-CoV-2 by FDA under an Emergency Use Authorization (EUA). This EUA will remain in effect (meaning this test can be used) for the duration of the COVID-19 declaration under Section 564(b)(1) of the Act, 21 U.S.C. section 360bbb-3(b)(1), unless the authorization is terminated or revoked.     Resp Syncytial Virus by PCR NEGATIVE NEGATIVE Final    Comment: (NOTE) Fact Sheet for Patients: BloggerCourse.com  Fact Sheet for Healthcare Providers: SeriousBroker.it  This test is not yet approved or cleared by the [place] FDA and has been authorized for detection and/or diagnosis of SARS-CoV-2 by FDA under an Emergency Use Authorization (EUA). This EUA will remain in effect (meaning this test can be used) for the duration of the COVID-19 declaration under Section 564(b)(1) of the Act, 21 U.S.C. section 360bbb-3(b)(1), unless the authorization is terminated or revoked.  Performed at [facility], [address] [place], [place] [address]   Blood Culture (routine x 2)     Status: Abnormal   Collection Time: 08/24/22 10:01 PM   Specimen: BLOOD  Result Value Ref Range Status   Specimen Description   Final    BLOOD BLOOD LEFT HAND Performed at [facility], [address] [place], [place] [address]    Special Requests   Final    BOTTLES DRAWN AEROBIC AND ANAEROBIC Blood Culture adequate volume Performed at [facility], [address], [place], [place] [address]    Culture  Setup Time   Final    GRAM POSITIVE COCCI IN BOTH AEROBIC AND ANAEROBIC BOTTLES Gram Stain Report Called to,Read Back By and Verified With: PIPKIN,C ON 08/25/22 AT 2110 BY LOY,C CRITICAL RESULT CALLED TO, READ BACK BY AND VERIFIED WITH: C PIPKINS,RN@[id number]  11/26/22 [facility] Performed at [facility], [address] [place], [place] [address]    Culture (A)  Final    AEROCOCCUS URINAE Standardized susceptibility testing for this organism is not available. Performed at [facility], [address] [place], [place] [address]    Report Status 08/29/2022 FINAL  Final  Blood Culture ID Panel (Reflexed)     Status: None   Collection Time: 08/24/22 10:01 PM  Result Value Ref Range Status   Enterococcus faecalis NOT DETECTED NOT DETECTED Final   Enterococcus Faecium NOT DETECTED NOT DETECTED Final   Listeria monocytogenes NOT DETECTED NOT DETECTED Final   Staphylococcus species NOT DETECTED NOT DETECTED Final   Staphylococcus aureus (BCID) NOT DETECTED NOT DETECTED Final   Staphylococcus epidermidis NOT DETECTED NOT DETECTED Final   Staphylococcus lugdunensis NOT DETECTED NOT DETECTED Final   Streptococcus species NOT DETECTED NOT DETECTED Final   Streptococcus agalactiae NOT DETECTED NOT DETECTED Final   Streptococcus pneumoniae NOT DETECTED NOT DETECTED Final   Streptococcus pyogenes NOT DETECTED NOT DETECTED Final   A.calcoaceticus-baumannii NOT DETECTED NOT DETECTED Final   Bacteroides fragilis NOT DETECTED  NOT DETECTED Final   Enterobacterales NOT DETECTED NOT DETECTED Final   Enterobacter cloacae complex NOT DETECTED NOT DETECTED Final   Escherichia coli NOT DETECTED NOT DETECTED Final   Klebsiella aerogenes NOT DETECTED NOT DETECTED Final   Klebsiella oxytoca NOT DETECTED NOT DETECTED Final   Klebsiella pneumoniae NOT DETECTED NOT DETECTED Final   Proteus species NOT DETECTED NOT DETECTED Final   Salmonella species NOT DETECTED NOT DETECTED Final   Serratia marcescens NOT DETECTED NOT DETECTED Final   Haemophilus influenzae NOT DETECTED  NOT DETECTED Final   Neisseria meningitidis NOT DETECTED NOT DETECTED Final   Pseudomonas aeruginosa NOT DETECTED NOT DETECTED Final   Stenotrophomonas maltophilia NOT DETECTED NOT DETECTED Final   Candida albicans NOT DETECTED NOT DETECTED Final   Candida auris NOT DETECTED NOT DETECTED Final   Candida glabrata NOT DETECTED NOT DETECTED Final   Candida krusei NOT DETECTED NOT DETECTED Final   Candida parapsilosis NOT DETECTED NOT DETECTED Final   Candida tropicalis NOT DETECTED NOT DETECTED Final   Cryptococcus neoformans/gattii NOT DETECTED NOT DETECTED Final    Comment: Performed at [facility], [address] [place], [place] [address]  Blood Culture (routine x 2)     Status: Abnormal   Collection Time: 08/24/22 10:02 PM   Specimen: BLOOD  Result Value Ref Range Status   Specimen Description   Final    BLOOD BLOOD LEFT WRIST Performed at [facility], [address], [place], [place] [address]    Special Requests   Final    BOTTLES DRAWN AEROBIC AND ANAEROBIC Blood Culture adequate volume Performed at [facility], [address] [place], [place] [address]    Culture  Setup Time   Final    GRAM POSITIVE COCCI AEROBIC BOTTLE ONLY CRITICAL VALUE NOTED.  VALUE IS CONSISTENT WITH PREVIOUSLY REPORTED AND CALLED VALUE. GRAM POSITIVE RODS ANAEROBIC BOTTLE ONLY Gram Stain Report Called to,Read Back By and Verified With: MADISON,GINGER @ [phone]  ON 08/26/2022 BY FRATTO,ASHLEY Performed at [facility], [address] [place], [place] [address]    Culture (A)  Final    AEROCOCCUS URINAE GRAM POSITIVE RODS Actinotignum schaalii  Standardized susceptibility testing for this organism is not available. Performed at [facility], [address] [place], [place] [address]    Report Status 08/28/2022 FINAL  Final  Culture, blood (Routine X 2) w Reflex to ID Panel     Status: None   Collection Time: 08/26/22 11:50 AM   Specimen: BLOOD RIGHT HAND  Result Value Ref Range Status   Specimen Description BLOOD RIGHT HAND  Final   Special Requests   Final    BOTTLES DRAWN AEROBIC AND ANAEROBIC Blood Culture results may not be optimal due to an excessive volume of blood received in culture bottles   Culture   Final    NO GROWTH 5 DAYS Performed at [facility], [address], [place], [place] [address]    Report Status 08/31/2022 FINAL  Final  Culture, blood (Routine X 2) w Reflex to ID Panel     Status: None   Collection Time: 08/26/22 11:50 AM   Specimen: Right Antecubital; Blood  Result Value Ref Range Status   Specimen Description RIGHT ANTECUBITAL  Final   Special Requests   Final    BOTTLES DRAWN AEROBIC AND ANAEROBIC Blood Culture adequate volume   Culture   Final    NO GROWTH 5 DAYS Performed at [facility], [address] [place], [place] [address]    Report Status 08/31/2022 FINAL  Final     Rexene Alberts, MSN, NP-C [facility]  [email or web address] Pager: [phone] Office: [phone] [facility] Main Line: [phone] *Secure Chat Communication Welcome   Total Encounter Time: 20 min

## 2022-09-01 NOTE — Progress Notes (Signed)
PROGRESS NOTE    Jesse GEISLER Sr.  ZOX:[id number] DOB: [birth year] DOA: 08/24/2022 PCP: Elfredia Nevins, MD   Brief Narrative:  89 y.o. male with a history of hypertension, BPH, prostate cancer. Patient presented secondary to generalized weakness with evidence of sepsis on admission. Patient was started on empiric antibiotics for found to have gram-positive cocci bacteremia. Patient also noted to have a right foot ulcer concerning for possible osteomyelitis. Infectious disease consulted.    Assessment & Plan:  Principal Problem:   Polymicrobial bacterial infection Active Problems:   Fever of undetermined origin   Lactic acidosis   Generalized weakness   Bronchitis   Bilateral pleural effusion   Right foot ulcer (HCC)   Essential hypertension   BPH (benign prostatic hyperplasia)   Paroxysmal atrial fibrillation (HCC)   Osteomyelitis of fifth toe of right foot (HCC)   Bacteremia   Acute cystitis without hematuria   PVD (peripheral vascular disease) (HCC)     Sepsis secondary to gram-positive bacteremia Aerococcus/Actinotignum schaalii bacteremia Antibiotic management per infectious disease at this time.  He should be completing his 7-day bacteremia treatment secondary to UTI today but if we need to cover him for his toe, Augmentin is recommended?   Right foot ulcer Right foot osteomyelitis fifth metatarsal head Lack of poor circulation noted on the ABI.  Vascular team consulted, Dr. Lajoyce Corners following.  Likely will need angiogram  Anemia Hemoglobin stable at 8.9.  Continue to monitor   Peripheral neuropathy History of vitamin B12 deficiency, supplements continue   Progressive weakness Deconditioning but could also be related to ongoing bacteremia. PT/OT recommending SNF on discharge.   Prostate cancer Patient follows with urology. During this admission, sclerotic focus noted on left second rib and is nonspecific. May need to discuss with IR prior to discharge.   Atrial  fibrillation with slow ventricular response Stable currently. Transthoracic Echocardiogram ordered and is significant for mildly dilated left atrium with normal LVEF.  Previous provider discussed anticoagulation with patient's family, currently on hold.   Pressure injury Left heel, right buttocks.  Both present on admission.     DVT prophylaxis: Lovenox Code Status:   Code Status: Full Code Family Communication: Son at bedside Disposition Plan: Discharge home likely in several days pending continued management/workup for bacteremia and foot ulcer/osteomyelitis     Pressure Injury 08/25/22 Heel Left Deep Tissue Pressure Injury - Purple or maroon localized area of discolored intact skin or blood-filled blister due to damage of underlying soft tissue from pressure and/or shear. 6.5cm x 5.5cm (Active)  08/25/22 0730  Location: Heel  Location Orientation: Left  Staging: Deep Tissue Pressure Injury - Purple or maroon localized area of discolored intact skin or blood-filled blister due to damage of underlying soft tissue from pressure and/or shear.  Wound Description (Comments): 6.5cm x 5.5cm  Present on Admission: Yes     Pressure Injury 08/25/22 Buttocks Right Stage 2 -  Partial thickness loss of dermis presenting as a shallow open injury with a red, pink wound bed without slough. (Active)  08/25/22   Location: Buttocks  Location Orientation: Right  Staging: Stage 2 -  Partial thickness loss of dermis presenting as a shallow open injury with a red, pink wound bed without slough.  Wound Description (Comments):   Present on Admission: Yes     Diet Orders (From admission, onward)     Start     Ordered   08/25/22 0027  Diet Heart Room service appropriate? Yes; Fluid consistency: Thin  Diet effective  now       Question Answer Comment  Room service appropriate? Yes   Fluid consistency: Thin      08/25/22 0027            Subjective: Doing ok no complaints.     Examination:  General exam: Appears calm and comfortable  Respiratory system: Clear to auscultation. Respiratory effort normal. Cardiovascular system: S1 & S2 heard, RRR. No JVD, murmurs, rubs, gallops or clicks. No pedal edema. Gastrointestinal system: Abdomen is nondistended, soft and nontender. No organomegaly or masses felt. Normal bowel sounds heard. Central nervous system: Alert and oriented. No focal neurological deficits. Extremities: Symmetric 5 x 5 power. Skin: No rashes, lesions or ulcers Psychiatry: Judgement and insight appear normal. Mood & affect appropriate.  Objective: Vitals:   08/31/22 1603 08/31/22 2121 09/01/22 0611 09/01/22 0838  BP: (!) 142/53 (!) 142/49 (!) 147/60 (!) 129/54  Pulse: (!) 56 (!) 54 (!) 108 (!) 59  Resp: 16   17  Temp: 98.3 F (36.8 C) 98.2 F (36.8 C) 97.6 F (36.4 C) 97.8 F (36.6 C)  TempSrc: Oral Oral  Oral  SpO2: 100% 100% 100% 100%  Weight:      Height:        Intake/Output Summary (Last 24 hours) at 09/01/2022 0937 Last data filed at 09/01/2022 4782 Gross per 24 hour  Intake 1260 ml  Output 1600 ml  Net -340 ml   Filed Weights   08/24/22 1414  Weight: 82.1 kg    Scheduled Meds:  cyanocobalamin  1,000 mcg Intramuscular Daily   dextromethorphan-guaiFENesin  1 tablet Oral BID   enoxaparin (LOVENOX) injection  40 mg Subcutaneous Q24H   furosemide  20 mg Intravenous Daily   leptospermum manuka honey  1 Application Topical Daily   multivitamin with minerals  1 tablet Oral Daily   pantoprazole  40 mg Oral Daily   tamsulosin  0.4 mg Oral QPC supper   Continuous Infusions:  Nutritional status     Body mass index is 28.35 kg/m.  Data Reviewed:   CBC: Recent Labs  Lab 08/26/22 0416 08/27/22 0319 08/29/22 0031 08/31/22 1257  WBC 14.7* 12.7* 8.1 7.3  HGB 8.6* 9.5* 8.7* 8.9*  HCT 26.4* 29.2* 26.4* 28.1*  MCV 105.2* 103.2* 101.9* 103.3*  PLT 232 245 241 277   Basic Metabolic Panel: Recent Labs  Lab  08/26/22 0416 08/27/22 0319 08/29/22 0031  NA 133* 135 134*  K 3.9 3.6 4.0  CL 105 103 99  CO2 22 21* 25  GLUCOSE 125* 103* 119*  BUN 31* 27* 21  CREATININE 1.00 1.09 0.99  CALCIUM 8.1* 8.6* 8.6*   GFR: Estimated Creatinine Clearance: 48.8 mL/min (by C-G formula based on SCr of 0.99 mg/dL). Liver Function Tests: Recent Labs  Lab 08/27/22 0319  AST 33  ALT 18  ALKPHOS 46  BILITOT 0.8  PROT 5.9*  ALBUMIN 2.6*   No results for input(s): "LIPASE", "AMYLASE" in the last 168 hours. No results for input(s): "AMMONIA" in the last 168 hours. Coagulation Profile: No results for input(s): "INR", "PROTIME" in the last 168 hours. Cardiac Enzymes: Recent Labs  Lab 08/26/22 0416  CKTOTAL 322   BNP (last 3 results) No results for input(s): "PROBNP" in the last 8760 hours. HbA1C: No results for input(s): "HGBA1C" in the last 72 hours. CBG: No results for input(s): "GLUCAP" in the last 168 hours. Lipid Profile: No results for input(s): "CHOL", "HDL", "LDLCALC", "TRIG", "CHOLHDL", "LDLDIRECT" in the last 72 hours. Thyroid Function  Tests: No results for input(s): "TSH", "T4TOTAL", "FREET4", "T3FREE", "THYROIDAB" in the last 72 hours. Anemia Panel: Recent Labs    08/31/22 1257  VITAMINB12 >7,500*  FOLATE 10.5  FERRITIN 501*  TIBC 193*  IRON 115  RETICCTPCT 0.9   Sepsis Labs: Recent Labs  Lab 08/26/22 0416 08/29/22 0031  PROCALCITON 16.43 2.71    Recent Results (from the past 240 hour(s))  Urine Culture (for pregnant, neutropenic or urologic patients or patients with an indwelling urinary catheter)     Status: Abnormal   Collection Time: 08/24/22  4:50 PM   Specimen: Urine, Clean Catch  Result Value Ref Range Status   Specimen Description   Final    URINE, CLEAN CATCH Performed at [facility], [address] [place], [place] [address]    Special Requests   Final    NONE Performed at [facility], [address] [place], [place] [address]    Culture (A)  Final     >=100,000 COLONIES/mL AEROCOCCUS SPECIES Standardized susceptibility testing for this organism is not available. Performed at [facility], [address] [place], [place] [address]    Report Status 08/27/2022 FINAL  Final  Resp panel by RT-PCR (RSV, Flu A&B, Covid) Anterior Nasal Swab     Status: None   Collection Time: 08/24/22  9:55 PM   Specimen: Anterior Nasal Swab  Result Value Ref Range Status   SARS Coronavirus 2 by RT PCR NEGATIVE NEGATIVE Final    Comment: (NOTE) SARS-CoV-2 target nucleic acids are NOT DETECTED.  The SARS-CoV-2 RNA is generally detectable in upper respiratory specimens during the acute phase of infection. The lowest concentration of SARS-CoV-2 viral copies this assay can detect is 138 copies/mL. A negative result does not preclude SARS-Cov-2 infection and should not be used as the sole basis for treatment or other patient management decisions. A negative result may occur with  improper specimen collection/handling, submission of specimen other than nasopharyngeal swab, presence of viral mutation(s) within the areas targeted by this assay, and inadequate number of viral copies(<138 copies/mL). A negative result must be combined with clinical observations, patient history, and epidemiological information. The expected result is Negative.  Fact Sheet for Patients:  BloggerCourse.com  Fact Sheet for Healthcare Providers:  SeriousBroker.it  This test is no t yet approved or cleared by the [place] FDA and  has been authorized for detection and/or diagnosis of SARS-CoV-2 by FDA under an Emergency Use Authorization (EUA). This EUA will remain  in effect (meaning this test can be used) for the duration of the COVID-19 declaration under Section 564(b)(1) of the Act, 21 U.S.C.section 360bbb-3(b)(1), unless the authorization is terminated  or revoked sooner.       Influenza A by PCR NEGATIVE  NEGATIVE Final   Influenza B by PCR NEGATIVE NEGATIVE Final    Comment: (NOTE) The Xpert Xpress SARS-CoV-2/FLU/RSV plus assay is intended as an aid in the diagnosis of influenza from Nasopharyngeal swab specimens and should not be used as a sole basis for treatment. Nasal washings and aspirates are unacceptable for Xpert Xpress SARS-CoV-2/FLU/RSV testing.  Fact Sheet for Patients: BloggerCourse.com  Fact Sheet for Healthcare Providers: SeriousBroker.it  This test is not yet approved or cleared by the [place] FDA and has been authorized for detection and/or diagnosis of SARS-CoV-2 by FDA under an Emergency Use Authorization (EUA). This EUA will remain in effect (meaning this test can be used) for the duration of the COVID-19 declaration under Section 564(b)(1) of the Act,  21 U.S.C. section 360bbb-3(b)(1), unless the authorization is terminated or revoked.     Resp Syncytial Virus by PCR NEGATIVE NEGATIVE Final    Comment: (NOTE) Fact Sheet for Patients: BloggerCourse.com  Fact Sheet for Healthcare Providers: SeriousBroker.it  This test is not yet approved or cleared by the [place] FDA and has been authorized for detection and/or diagnosis of SARS-CoV-2 by FDA under an Emergency Use Authorization (EUA). This EUA will remain in effect (meaning this test can be used) for the duration of the COVID-19 declaration under Section 564(b)(1) of the Act, 21 U.S.C. section 360bbb-3(b)(1), unless the authorization is terminated or revoked.  Performed at [facility], [address] [place], [place] [address]   Blood Culture (routine x 2)     Status: Abnormal   Collection Time: 08/24/22 10:01 PM   Specimen: BLOOD  Result Value Ref Range Status   Specimen Description   Final    BLOOD BLOOD LEFT HAND Performed at [facility], [address], [place], [place] [address]     Special Requests   Final    BOTTLES DRAWN AEROBIC AND ANAEROBIC Blood Culture adequate volume Performed at [facility], [address], [place], [place] [address]    Culture  Setup Time   Final    GRAM POSITIVE COCCI IN BOTH AEROBIC AND ANAEROBIC BOTTLES Gram Stain Report Called to,Read Back By and Verified With: PIPKIN,C ON 08/25/22 AT 2110 BY LOY,C CRITICAL RESULT CALLED TO, READ BACK BY AND VERIFIED WITH: C PIPKINS,RN@[email or web address]  11/26/22 [facility] Performed at [facility], [address] [place], [place] [address]    Culture (A)  Final    AEROCOCCUS URINAE Standardized susceptibility testing for this organism is not available. Performed at [facility], [address] [place], [place] [address]    Report Status 08/29/2022 FINAL  Final  Blood Culture ID Panel (Reflexed)     Status: None   Collection Time: 08/24/22 10:01 PM  Result Value Ref Range Status   Enterococcus faecalis NOT DETECTED NOT DETECTED Final   Enterococcus Faecium NOT DETECTED NOT DETECTED Final   Listeria monocytogenes NOT DETECTED NOT DETECTED Final   Staphylococcus species NOT DETECTED NOT DETECTED Final   Staphylococcus aureus (BCID) NOT DETECTED NOT DETECTED Final   Staphylococcus epidermidis NOT DETECTED NOT DETECTED Final   Staphylococcus lugdunensis NOT DETECTED NOT DETECTED Final   Streptococcus species NOT DETECTED NOT DETECTED Final   Streptococcus agalactiae NOT DETECTED NOT DETECTED Final   Streptococcus pneumoniae NOT DETECTED NOT DETECTED Final   Streptococcus pyogenes NOT DETECTED NOT DETECTED Final   A.calcoaceticus-baumannii NOT DETECTED NOT DETECTED Final   Bacteroides fragilis NOT DETECTED NOT DETECTED Final   Enterobacterales NOT DETECTED NOT DETECTED Final   Enterobacter cloacae complex NOT DETECTED NOT DETECTED Final   Escherichia coli NOT DETECTED NOT DETECTED Final   Klebsiella aerogenes NOT DETECTED NOT DETECTED Final   Klebsiella oxytoca NOT DETECTED NOT DETECTED Final   Klebsiella  pneumoniae NOT DETECTED NOT DETECTED Final   Proteus species NOT DETECTED NOT DETECTED Final   Salmonella species NOT DETECTED NOT DETECTED Final   Serratia marcescens NOT DETECTED NOT DETECTED Final   Haemophilus influenzae NOT DETECTED NOT DETECTED Final   Neisseria meningitidis NOT DETECTED NOT DETECTED Final   Pseudomonas aeruginosa NOT DETECTED NOT DETECTED Final   Stenotrophomonas maltophilia NOT DETECTED NOT DETECTED Final   Candida albicans NOT DETECTED NOT DETECTED Final   Candida auris NOT DETECTED NOT DETECTED Final   Candida glabrata NOT DETECTED NOT DETECTED  Final   Candida krusei NOT DETECTED NOT DETECTED Final   Candida parapsilosis NOT DETECTED NOT DETECTED Final   Candida tropicalis NOT DETECTED NOT DETECTED Final   Cryptococcus neoformans/gattii NOT DETECTED NOT DETECTED Final    Comment: Performed at [facility], [address] [place], [place] [address]  Blood Culture (routine x 2)     Status: Abnormal   Collection Time: 08/24/22 10:02 PM   Specimen: BLOOD  Result Value Ref Range Status   Specimen Description   Final    BLOOD BLOOD LEFT WRIST Performed at [facility], [address], [place], [place] [address]    Special Requests   Final    BOTTLES DRAWN AEROBIC AND ANAEROBIC Blood Culture adequate volume Performed at [facility], [address] [place], [place] [address]    Culture  Setup Time   Final    GRAM POSITIVE COCCI AEROBIC BOTTLE ONLY CRITICAL VALUE NOTED.  VALUE IS CONSISTENT WITH PREVIOUSLY REPORTED AND CALLED VALUE. GRAM POSITIVE RODS ANAEROBIC BOTTLE ONLY Gram Stain Report Called to,Read Back By and Verified With: MADISON,GINGER @ [phone] ON 08/26/2022 BY FRATTO,ASHLEY Performed at [facility], [address], [place], [place] [address]    Culture (A)  Final    AEROCOCCUS URINAE GRAM POSITIVE RODS Actinotignum schaalii  Standardized susceptibility testing for this organism is not available. Performed at [facility], [address] [place], [place] [address]    Report Status 08/28/2022 FINAL  Final  Culture, blood (Routine X 2) w Reflex to ID Panel     Status: None   Collection Time: 08/26/22 11:50 AM   Specimen: BLOOD RIGHT HAND  Result Value Ref Range Status   Specimen Description BLOOD RIGHT HAND  Final   Special Requests   Final    BOTTLES DRAWN AEROBIC AND ANAEROBIC Blood Culture results may not be optimal due to an excessive volume of blood received in culture bottles   Culture   Final    NO GROWTH 5 DAYS Performed at [facility], [address] [place], [place] [address]    Report Status 08/31/2022 FINAL  Final  Culture, blood (Routine X 2) w Reflex to ID Panel     Status: None   Collection Time: 08/26/22 11:50 AM   Specimen: Right Antecubital; Blood  Result Value Ref Range Status   Specimen Description RIGHT ANTECUBITAL  Final   Special Requests   Final    BOTTLES DRAWN AEROBIC AND ANAEROBIC Blood Culture adequate volume   Culture   Final    NO GROWTH 5 DAYS Performed at [facility], [address] [place], [place] [address]    Report Status 08/31/2022 FINAL  Final         Radiology Studies: MR FOOT RIGHT W WO CONTRAST  Result Date: 08/30/2022 CLINICAL DATA:  Soft tissue infection suspected. Ventral osteomyelitis. Follow-up from CT scan. Increasing lower extremity weakness and decreased ability to ambulate without assistance for about 4 weeks. No history of diabetes. EXAM: MRI OF THE RIGHT FOREFOOT WITHOUT AND WITH CONTRAST TECHNIQUE: Multiplanar, multisequence MR imaging of the right forefoot was performed before and after the administration of intravenous contrast. CONTRAST:  10mL GADAVIST GADOBUTROL 1 MMOL/ML IV SOLN COMPARISON:  Right foot radiographs 08/24/2022 and CT right foot 08/27/2022 FINDINGS: Bones/Joint/Cartilage In the region of the soft tissue ulcer lateral to the fifth toe metatarsal head, there is moderate to high-grade lateral greater than medial 5th metatarsal head marrow  edema and enhancement. There is also  marrow edema and enhancement within the lateral greater than medial aspect of the base of the proximal phalanx of the fifth toe. Within the limitations of motion artifact, there appears to be partial thinning of the lateral cortex of the fifth metatarsal head and possibly the lateral base of the proximal phalanx. Mild hallux valgus with mild-to-moderate lateral greater than medial great toe metatarsophalangeal cartilage thinning and peripheral osteophytosis. Mild-to-moderate osteoarthritis of the tarsometatarsal joints diffusely. Ligaments The Lisfranc ligament complex is intact. The metatarsophalangeal and interphalangeal collateral ligaments appear grossly intact, within the limitation of the motion artifact. Muscles and Tendons No flexor or extensor tendon tear is seen. There is mild edema within the plantar midfoot flexor digitorum brevis musculotendinous junction, nonspecific myositis (axial series 8 images 26 through 29). Soft tissues Redemonstration of soft tissue ulcer lateral to the great toe metatarsal head that appears to extend down to bone (axial series 8, image 19). IMPRESSION: 1. Redemonstration of soft tissue ulcer lateral to the great toe metatarsal head that appears to extend down to bone. Marrow edema enhancement within the lateral greater than medial aspects of the fifth metatarsal head and base of the proximal phalanx. Within the limitations of motion artifact, there appears to be partial thinning of the lateral cortex of the fifth metatarsal head and possibly the lateral base of the proximal phalanx. Findings are concerning for acute osteomyelitis. 2. Mild hallux valgus with mild-to-moderate great toe metatarsophalangeal cartilage thinning and peripheral osteophytosis. 3. Mild edema within the plantar midfoot flexor digitorum brevis musculotendinous junction, nonspecific myositis. Electronically Signed   By: Neita Garnet M.D.   On: 08/30/2022 16:32            LOS: 7 days   Time spent= 35 mins    Eliakim Tendler Joline Maxcy, MD [facility]  If 7PM-7AM, please contact night-coverage  09/01/2022, 9:37 AM

## 2022-09-01 NOTE — Progress Notes (Signed)
Physical Therapy Treatment Patient Details Name: Jesse GENTER Sr. MRN: [id number] DOB: 1928/05/10 Today's Date: 09/01/2022   History of Present Illness 89 y.o. male [facility] to [facility] 08/24/2022 due to generalized progressive weakness. 5/8 found to be febrile with leukocytosis, started on antibiotics for sepsis with unclear source. 5/9 Transferred to [facility] hospital found to have gram-positive cocci bacteremia with possible R foot osteomyelitis. PMH includes hypertension, BPH, prostate cancer    PT Comments    Pt was seen for standing after ROM to legs to ease the progression to sit up.  Pt is motivated and more alert today but continues to need equipment to stand up safely to get to a recliner.  Family in again and supportive of all tx objectives, and will continue to work on the progression to gait as tolerated.  Follow acutely for goals of PT as are on POC.   Recommendations for follow up therapy are one component of a multi-disciplinary discharge planning process, led by the attending physician.  Recommendations may be updated based on patient status, additional functional criteria and insurance authorization.  Follow Up Recommendations  Can patient physically be transported by private vehicle: No    Assistance Recommended at Discharge Frequent or constant Supervision/Assistance  Patient can return home with the following Two people to help with walking and/or transfers;A lot of help with bathing/dressing/bathroom;Assistance with cooking/housework;Assist for transportation;Help with stairs or ramp for entrance   Equipment Recommendations  None recommended by PT    Recommendations for Other Services       Precautions / Restrictions Precautions Precautions: Fall Precaution Comments: Stedy to stand Restrictions Weight Bearing Restrictions: No Other Position/Activity Restrictions: HOH     Mobility  Bed Mobility Overal bed mobility: Needs Assistance Bed Mobility: Supine to Sit      Supine to sit: Max assist     General bed mobility comments: max to support the trunk to sit up on side of bed    Transfers Overall transfer level: Needs assistance Equipment used: Ambulation equipment used Transfers: Sit to/from Stand, Bed to chair/wheelchair/BSC Sit to Stand: Max assist           General transfer comment: total assist to get to chair with stedy Transfer via Lift Equipment: Stedy  Ambulation/Gait               General Gait Details: unable   Social research officer, government Rankin (Stroke Patients Only)       Balance                                            Cognition Arousal/Alertness: Awake/alert Behavior During Therapy: WFL for tasks assessed/performed Overall Cognitive Status: Within Functional Limits for tasks assessed                                 General Comments: [facility]        Exercises General Exercises - Lower Extremity Ankle Circles/Pumps: AROM, AAROM, 5 reps Quad Sets: AROM, 10 reps Hip ABduction/ADduction: AAROM, 10 reps    General Comments General comments (skin integrity, edema, etc.): Pt is up to stand from bedside on stedy, noted max assist of one only to get to supported standing today  Pertinent Vitals/Pain Pain Assessment Pain Assessment: Faces Faces Pain Scale: Hurts little more Pain Location: R hip to move any direction Pain Descriptors / Indicators: Guarding, Grimacing Pain Intervention(s): Limited activity within patient's tolerance, Premedicated before session, Repositioned, Monitored during session    Home Living                          Prior Function            PT Goals (current goals can now be found in the care plan section) Acute Rehab PT Goals Patient Stated Goal: return home after rehab Progress towards PT goals: Progressing toward goals    Frequency    Min 3X/week      PT Plan Current plan remains  appropriate    Co-evaluation              AM-PAC PT "6 Clicks" Mobility   Outcome Measure  Help needed turning from your back to your side while in a flat bed without using bedrails?: A Lot Help needed moving from lying on your back to sitting on the side of a flat bed without using bedrails?: A Lot Help needed moving to and from a bed to a chair (including a wheelchair)?: Total Help needed standing up from a chair using your arms (e.g., wheelchair or bedside chair)?: A Lot Help needed to walk in hospital room?: Total Help needed climbing 3-5 steps with a railing? : Total 6 Click Score: 9    End of Session Equipment Utilized During Treatment: Gait belt Activity Tolerance: Patient limited by fatigue;Patient limited by pain;Treatment limited secondary to medical complications (Comment) Patient left: in chair;with call bell/phone within reach;with family/visitor present Nurse Communication: Mobility status;Need for lift equipment PT Visit Diagnosis: Unsteadiness on feet (R26.81);Other abnormalities of gait and mobility (R26.89);Muscle weakness (generalized) (M62.81)     Time: 1610-9604 PT Time Calculation (min) (ACUTE ONLY): 21 min  Charges:  $Therapeutic Exercise: 8-22 mins             Ivar Drape 09/01/2022, 2:58 PM  Samul Dada, PT PhD Acute Rehab Dept. Number: [facility] [id number] and [facility] [phone]

## 2022-09-01 NOTE — Consult Note (Signed)
ORTHOPAEDIC CONSULTATION  REQUESTING PHYSICIAN: Dimple Nanas, MD  Chief Complaint: Necrotic ulcer fifth metatarsal head right foot.  HPI: Jesse HOARD Sr. is a 89 y.o. male who presents with peripheral vascular disease with a necrotic ulcer fifth metatarsal head right foot with underlying osteomyelitis.  Patient states that he can ambulate at home with a walker.  Patient states he is working with therapy for ambulation with a walker in the hospital.  Past Medical History:  Diagnosis Date   Hypertension    Kidney calculus 2014;2013   Pneumonia    Prostate cancer [facility])    Shortness of breath    Past Surgical History:  Procedure Laterality Date   CATARACT EXTRACTION W/PHACO Right 02/13/2013   Procedure: CATARACT EXTRACTION RIGHT EYE (WITH PHACO) AND INTRAOCULAR LENS PLACEMENT  CDE=14.53;  Surgeon: Loraine Leriche T. Nile Riggs, MD;  Location: [facility];  Service: Ophthalmology;  Laterality: Right;   CATARACT EXTRACTION W/PHACO Left 02/27/2013   Procedure: CATARACT EXTRACTION PHACO AND INTRAOCULAR LENS PLACEMENT (IOC);  Surgeon: Loraine Leriche T. Nile Riggs, MD;  Location: [facility];  Service: Ophthalmology;  Laterality: Left;  CDE:12.54   COLONOSCOPY     JOINT REPLACEMENT Right 1993;1980   Right x2   Social History   Socioeconomic History   Marital status: Widowed    Spouse name: Not on file   Number of children: Not on file   Years of education: Not on file   Highest education level: Not on file  Occupational History   Not on file  Tobacco Use   Smoking status: Never   Smokeless tobacco: Never  Vaping Use   Vaping Use: Never used  Substance and Sexual Activity   Alcohol use: No   Drug use: No   Sexual activity: Not on file  Other Topics Concern   Not on file  Social History Narrative   Not on file   Social Determinants of Health   Financial Resource Strain: Low Risk  (07/24/2019)   Overall Financial Resource Strain (CARDIA)    Difficulty of Paying Living Expenses: Not hard at all  Food  Insecurity: No Food Insecurity (08/25/2022)   Hunger Vital Sign    Worried About Running Out of Food in the Last Year: Never true    Ran Out of Food in the Last Year: Never true  Transportation Needs: No Transportation Needs (08/25/2022)   PRAPARE - Administrator, Civil Service (Medical): No    Lack of Transportation (Non-Medical): No  Physical Activity: Inactive (07/24/2019)   Exercise Vital Sign    Days of Exercise per Week: 0 days    Minutes of Exercise per Session: 0 min  Stress: No Stress Concern Present (07/24/2019)   [employer] of Occupational Health - Occupational Stress Questionnaire    Feeling of Stress : Not at all  Social Connections: Moderately Integrated (07/24/2019)   Social Connection and Isolation Panel [NHANES]    Frequency of Communication with Friends and Family: More than three times a week    Frequency of Social Gatherings with Friends and Family: More than three times a week    Attends Religious Services: More than 4 times per year    Active Member of [employer] or Organizations: Yes    Attends Banker Meetings: 1 to 4 times per year    Marital Status: Widowed   History reviewed. No pertinent family history. - negative except otherwise stated in the family history section Allergies  Allergen Reactions   Codeine Swelling  Prior to Admission medications   Medication Sig Start Date End Date Taking? Authorizing Provider  alendronate (FOSAMAX) 70 MG tablet TAKE 1 TABLET EVERY WEEK IN THE MORNING 30 MIN BEFORE EATING WITH AN 8OZ GLASS OF WATER (SIT UP 30 MIN) 07/12/22  Yes Dahlstedt, Jeannett Senior, MD  aspirin EC 81 MG tablet Take 81 mg by mouth daily.   Yes [provider]  benazepril (LOTENSIN) 10 MG tablet Take 10 mg by mouth daily. 05/15/19  Yes [provider]  fish oil-omega-3 fatty acids 1000 MG capsule Take 1 g by mouth daily.   Yes [provider]  hydrochlorothiazide (HYDRODIURIL) 25 MG tablet Take 25 mg by mouth  daily.   Yes [provider]  tamsulosin (FLOMAX) 0.4 MG CAPS capsule Take 0.4 mg by mouth daily after supper.   Yes [provider]  furosemide (LASIX) 20 MG tablet Take 20-40 mg by mouth daily as needed for fluid.  Patient not taking: Reported on 08/25/2022 07/09/19   [provider]   MR FOOT RIGHT W WO CONTRAST  Result Date: 08/30/2022 CLINICAL DATA:  Soft tissue infection suspected. Ventral osteomyelitis. Follow-up from CT scan. Increasing lower extremity weakness and decreased ability to ambulate without assistance for about 4 weeks. No history of diabetes. EXAM: MRI OF THE RIGHT FOREFOOT WITHOUT AND WITH CONTRAST TECHNIQUE: Multiplanar, multisequence MR imaging of the right forefoot was performed before and after the administration of intravenous contrast. CONTRAST:  10mL GADAVIST GADOBUTROL 1 MMOL/ML IV SOLN COMPARISON:  Right foot radiographs 08/24/2022 and CT right foot 08/27/2022 FINDINGS: Bones/Joint/Cartilage In the region of the soft tissue ulcer lateral to the fifth toe metatarsal head, there is moderate to high-grade lateral greater than medial 5th metatarsal head marrow edema and enhancement. There is also marrow edema and enhancement within the lateral greater than medial aspect of the base of the proximal phalanx of the fifth toe. Within the limitations of motion artifact, there appears to be partial thinning of the lateral cortex of the fifth metatarsal head and possibly the lateral base of the proximal phalanx. Mild hallux valgus with mild-to-moderate lateral greater than medial great toe metatarsophalangeal cartilage thinning and peripheral osteophytosis. Mild-to-moderate osteoarthritis of the tarsometatarsal joints diffusely. Ligaments The Lisfranc ligament complex is intact. The metatarsophalangeal and interphalangeal collateral ligaments appear grossly intact, within the limitation of the motion artifact. Muscles and Tendons No flexor or extensor tendon tear is  seen. There is mild edema within the plantar midfoot flexor digitorum brevis musculotendinous junction, nonspecific myositis (axial series 8 images 26 through 29). Soft tissues Redemonstration of soft tissue ulcer lateral to the great toe metatarsal head that appears to extend down to bone (axial series 8, image 19). IMPRESSION: 1. Redemonstration of soft tissue ulcer lateral to the great toe metatarsal head that appears to extend down to bone. Marrow edema enhancement within the lateral greater than medial aspects of the fifth metatarsal head and base of the proximal phalanx. Within the limitations of motion artifact, there appears to be partial thinning of the lateral cortex of the fifth metatarsal head and possibly the lateral base of the proximal phalanx. Findings are concerning for acute osteomyelitis. 2. Mild hallux valgus with mild-to-moderate great toe metatarsophalangeal cartilage thinning and peripheral osteophytosis. 3. Mild edema within the plantar midfoot flexor digitorum brevis musculotendinous junction, nonspecific myositis. Electronically Signed   By: Neita Garnet M.D.   On: 08/30/2022 16:32   - pertinent xrays, CT, MRI studies were reviewed and independently interpreted  Positive ROS: All other  systems have been reviewed and were otherwise negative with the exception of those mentioned in the HPI and as above.  Physical Exam: General: Alert, no acute distress Psychiatric: Patient is competent for consent with normal mood and affect Lymphatic: No axillary or cervical lymphadenopathy Cardiovascular: No pedal edema Respiratory: No cyanosis, no use of accessory musculature GI: No organomegaly, abdomen is soft and non-tender    Images:  @ENCIMAGES @  Labs:  Lab Results  Component Value Date   CRP 9.1 (H) 08/27/2022   CRP 12.1 (H) 08/26/2022   REPTSTATUS 08/31/2022 FINAL 08/26/2022   REPTSTATUS 08/31/2022 FINAL 08/26/2022   CULT  08/26/2022    NO GROWTH 5 DAYS Performed at  [facility], [address] [place], [place] [address]    CULT  08/26/2022    NO GROWTH 5 DAYS Performed at [facility], [address] [place], [place] [address]    [facility] ESCHERICHIA COLI 01/19/2010    Lab Results  Component Value Date   ALBUMIN 2.6 (L) 08/27/2022   ALBUMIN 2.7 (L) 08/25/2022   ALBUMIN 3.7 08/24/2022        Latest Ref Rng & Units 08/31/2022   12:57 PM 08/29/2022   12:31 AM 08/27/2022    3:19 AM  CBC EXTENDED  WBC 4.0 - 10.5 K/uL 7.3  8.1  12.7   RBC 4.22 - 5.81 MIL/uL 4.22 - 5.81 MIL/uL 2.72    2.72  2.59  2.83   Hemoglobin 13.0 - 17.0 g/dL 8.9  8.7  9.5   HCT 19.1 - 52.0 % 28.1  26.4  29.2   Platelets 150 - 400 K/uL 277  241  245     Neurologic: Patient does not have protective sensation bilateral lower extremities.   MUSCULOSKELETAL:   Skin: Examination patient has a necrotic ulcer 1 cm diameter over the fifth metatarsal head right foot.  This probes to bone.  MRI scan shows osteomyelitis of the fifth metatarsal head.  Albumin 2.6 with a white cell count of 7.3 and hemoglobin of 8.9.  ABIs shows a ABI in the right of 0.65 with a great toe pressure of 45.  Monophasic flow.  No ascending cellulitis.  Skin is thin and atrophic.  Assessment: Peripheral vascular disease with osteomyelitis and necrotic ulcer fifth metatarsal head right foot.  Plan: Patient does not have sufficient circulation to proceed with amputation of right  little toe.  I have consulted vascular surgery for endovascular evaluation.  If patient does have improved circulation could proceed with ray amputation.  Thank you for the consult and the opportunity to see Mr. Jesse Knackstedt, MD [facility] [phone] 8:08 AM

## 2022-09-01 NOTE — TOC Progression Note (Addendum)
Transition of Care (TOC) - Progression Note    Patient Details  Name: Jesse DULEY Sr. MRN: [id number] Date of Birth: 1929/02/01  Transition of Care [facility]) CM/SW Contact  Carley Hammed, LCSW Phone Number: 09/01/2022, 12:59 PM  Clinical Narrative:    CSW reviewed ID note and updated the facility that the Antibiotic was identified for pt. CSW sent referral back to [facility] to review, as this was the major barrier. Facility noted they will need dosage prior to accepting, currently that is unavailable. TOC will continue to follow.   Expected Discharge Plan: Skilled Nursing Facility Barriers to Discharge: Continued Medical Work up  Expected Discharge Plan and Services In-house Referral: Clinical Social Work Discharge Planning Services: CM Consult Post Acute Care Choice: Skilled Nursing Facility Living arrangements for the past 2 months: Single Family Home                                       Social Determinants of Health (SDOH) Interventions SDOH Screenings   Food Insecurity: No Food Insecurity (08/25/2022)  Housing: Low Risk  (08/25/2022)  Transportation Needs: No Transportation Needs (08/25/2022)  Utilities: Not At Risk (08/25/2022)  Alcohol Screen: Low Risk  (07/24/2019)  Depression (PHQ2-9): Low Risk  (07/24/2019)  Financial Resource Strain: Low Risk  (07/24/2019)  Physical Activity: Inactive (07/24/2019)  Social Connections: Moderately Integrated (07/24/2019)  Stress: No Stress Concern Present (07/24/2019)  Tobacco Use: Low Risk  (08/24/2022)    Readmission Risk Interventions     No data to display

## 2022-09-02 ENCOUNTER — Encounter (HOSPITAL_COMMUNITY)
Admission: EM | Disposition: A | Discharge status: Rehabilitation Facility | Payer: Self-pay | Source: Home / Self Care | Attending: Family Medicine

## 2022-09-02 DIAGNOSIS — A499 Bacterial infection, unspecified: Secondary | ICD-10-CM | POA: Diagnosis not present

## 2022-09-02 DIAGNOSIS — I70235 Atherosclerosis of native arteries of right leg with ulceration of other part of foot: Secondary | ICD-10-CM

## 2022-09-02 HISTORY — PX: ABDOMINAL AORTOGRAM W/LOWER EXTREMITY: CATH118223

## 2022-09-02 LAB — BASIC METABOLIC PANEL
Anion gap: 10 (ref 5–15)
BUN: 22 mg/dL (ref 8–23)
CO2: 25 mmol/L (ref 22–32)
Calcium: 8.3 mg/dL — ABNORMAL LOW (ref 8.9–10.3)
Chloride: 98 mmol/L (ref 98–111)
Creatinine, Ser: 1.02 mg/dL (ref 0.61–1.24)
GFR, Estimated: >60 mL/min (ref >60)
Glucose, Bld: 104 mg/dL — ABNORMAL HIGH (ref 70–99)
Potassium: 3.8 mmol/L (ref 3.5–5.1)
Sodium: 133 mmol/L — ABNORMAL LOW (ref 135–145)

## 2022-09-02 LAB — CBC
HCT: 28.6 % — ABNORMAL LOW (ref 39.0–52.0)
Hemoglobin: 9.1 g/dL — ABNORMAL LOW (ref 13.0–17.0)
MCH: 33.1 pg (ref 26.0–34.0)
MCHC: 31.8 g/dL (ref 30.0–36.0)
MCV: 104 fL — ABNORMAL HIGH (ref 80.0–100.0)
Platelets: 274 10*3/uL (ref 150–400)
RBC: 2.75 MIL/uL — ABNORMAL LOW (ref 4.22–5.81)
RDW: 14.5 % (ref 11.5–15.5)
WBC: 6.5 10*3/uL (ref 4.0–10.5)
nRBC: 0 % (ref 0.0–0.2)

## 2022-09-02 LAB — SURGICAL PCR SCREEN
MRSA, PCR: NEGATIVE
Staphylococcus aureus: NEGATIVE

## 2022-09-02 SURGERY — ABDOMINAL AORTOGRAM W/LOWER EXTREMITY
Anesthesia: LOCAL

## 2022-09-02 MED ORDER — ASPIRIN 81 MG PO TBEC
81.0000 mg | DELAYED_RELEASE_TABLET | Freq: Every day | ORAL | Status: DC
  Administered 2022-09-02 – 2022-09-10 (×9): 81 mg via ORAL
  Filled 2022-09-02 (×9): qty 1

## 2022-09-02 MED ORDER — HEPARIN SODIUM (PORCINE) 1000 UNIT/ML IJ SOLN
INTRAMUSCULAR | Status: DC | PRN
  Administered 2022-09-02: 8000 [IU] via INTRAVENOUS

## 2022-09-02 MED ORDER — LIDOCAINE HCL (PF) 1 % IJ SOLN
INTRAMUSCULAR | Status: DC | PRN
  Administered 2022-09-02: 20 mL

## 2022-09-02 MED ORDER — SODIUM CHLORIDE 0.9% FLUSH: 3.0000 mL | INTRAVENOUS | Status: DC | PRN

## 2022-09-02 MED ORDER — LABETALOL HCL 5 MG/ML IV SOLN: 10.0000 mg | INTRAVENOUS | Status: DC | PRN

## 2022-09-02 MED ORDER — ONDANSETRON HCL 4 MG/2ML IJ SOLN: 4.0000 mg | Freq: Four times a day (QID) | INTRAMUSCULAR | Status: DC | PRN

## 2022-09-02 MED ORDER — HEPARIN SODIUM (PORCINE) 5000 UNIT/ML IJ SOLN
5000.0000 [IU] | Freq: Three times a day (TID) | INTRAMUSCULAR | Status: DC
  Administered 2022-09-02 – 2022-09-06 (×12): 5000 [IU] via SUBCUTANEOUS
  Filled 2022-09-02 (×12): qty 1

## 2022-09-02 MED ORDER — SODIUM CHLORIDE 0.9 % IV SOLN: 250.0000 mL | INTRAVENOUS | Status: DC | PRN

## 2022-09-02 MED ORDER — HYDRALAZINE HCL 20 MG/ML IJ SOLN: 5.0000 mg | INTRAMUSCULAR | Status: DC | PRN

## 2022-09-02 MED ORDER — CLOPIDOGREL BISULFATE 75 MG PO TABS
300.0000 mg | ORAL_TABLET | Freq: Once | ORAL | Status: AC
  Administered 2022-09-02: 300 mg via ORAL
  Filled 2022-09-02: qty 4

## 2022-09-02 MED ORDER — HEPARIN (PORCINE) IN NACL 1000-0.9 UT/500ML-% IV SOLN
INTRAVENOUS | Status: DC | PRN
  Administered 2022-09-02 (×2): 500 mL

## 2022-09-02 MED ORDER — SODIUM CHLORIDE 0.9% FLUSH
3.0000 mL | Freq: Two times a day (BID) | INTRAVENOUS | Status: DC
  Administered 2022-09-03 – 2022-09-09 (×10): 3 mL via INTRAVENOUS

## 2022-09-02 MED ORDER — SODIUM CHLORIDE 0.9 % WEIGHT BASED INFUSION
1.0000 mL/kg/h | INTRAVENOUS | Status: AC
  Administered 2022-09-02: 1 mL/kg/h via INTRAVENOUS

## 2022-09-02 MED ORDER — IODIXANOL 320 MG/ML IV SOLN
INTRAVENOUS | Status: DC | PRN
  Administered 2022-09-02: 95 mL via INTRA_ARTERIAL

## 2022-09-02 MED ORDER — ATORVASTATIN CALCIUM 40 MG PO TABS
40.0000 mg | ORAL_TABLET | Freq: Every day | ORAL | Status: DC
  Administered 2022-09-02 – 2022-09-10 (×9): 40 mg via ORAL
  Filled 2022-09-02 (×9): qty 1

## 2022-09-02 MED ORDER — ACETAMINOPHEN 325 MG PO TABS: 650.0000 mg | ORAL_TABLET | ORAL | Status: DC | PRN

## 2022-09-02 MED ORDER — CLOPIDOGREL BISULFATE 75 MG PO TABS
75.0000 mg | ORAL_TABLET | Freq: Every day | ORAL | Status: DC
  Administered 2022-09-03 – 2022-09-09 (×7): 75 mg via ORAL
  Filled 2022-09-02 (×7): qty 1

## 2022-09-02 SURGICAL SUPPLY — 24 items
BALLN STERLING OTW 2X220X150 (BALLOONS) ×1
BALLN STERLING SL OTW 3X80X150 (BALLOONS) ×1
BALLOON STERLING OTW 2X220X150 (BALLOONS) IMPLANT
BALLOON STRLNG SL OTW 3X80X150 (BALLOONS) IMPLANT
CATH 0.018 NAVICROSS ANG 135 (CATHETERS) IMPLANT
CATH OMNI FLUSH 5F 65CM (CATHETERS) IMPLANT
CATH QUICKCROSS .035X135CM (MICROCATHETER) IMPLANT
CLOSURE PERCLOSE PROSTYLE (VASCULAR PRODUCTS) IMPLANT
DEVICE TORQUE .014-.018 (MISCELLANEOUS) IMPLANT
GLIDEWIRE ADV .035X260CM (WIRE) IMPLANT
KIT ENCORE 26 ADVANTAGE (KITS) IMPLANT
KIT MICROPUNCTURE NIT STIFF (SHEATH) IMPLANT
KIT PV (KITS) ×2 IMPLANT
SHEATH CATAPULT 6FR 60 (SHEATH) IMPLANT
SHEATH PINNACLE 5F 10CM (SHEATH) IMPLANT
STOPCOCK MORSE 400PSI 3WAY (MISCELLANEOUS) IMPLANT
SYR MEDRAD MARK 7 150ML (SYRINGE) ×2 IMPLANT
TORQUE DEVICE .014-.018 (MISCELLANEOUS) ×1
TRANSDUCER W/STOPCOCK (MISCELLANEOUS) ×2 IMPLANT
TRAY PV CATH (CUSTOM PROCEDURE TRAY) ×2 IMPLANT
TUBING CIL FLEX 10 FLL-RA (TUBING) IMPLANT
WIRE BENTSON .035X145CM (WIRE) IMPLANT
WIRE G V18X300CM (WIRE) IMPLANT
WIRE SHEPHERD 6G .014 (WIRE) IMPLANT

## 2022-09-02 NOTE — Progress Notes (Signed)
PROGRESS NOTE    Jesse MCQUOWN Sr.  UJW:[id number] DOB: May 10, 1928 DOA: 08/24/2022 PCP: Elfredia Nevins, MD   Brief Narrative:  89 y.o. male with a history of hypertension, BPH, prostate cancer. Patient presented secondary to generalized weakness with evidence of sepsis on admission. Patient was started on empiric antibiotics for found to have gram-positive cocci bacteremia. Patient also noted to have a right foot ulcer concerning for possible osteomyelitis.  ID, Dr. Lajoyce Corners and vascular were consulted.  Currently on IV antibiotics.  ABIs were abnormal therefore vascular is planning for angiogram.   Assessment & Plan:  Principal Problem:   Polymicrobial bacterial infection Active Problems:   Fever of undetermined origin   Lactic acidosis   Generalized weakness   Bronchitis   Bilateral pleural effusion   Right foot ulcer (HCC)   Essential hypertension   BPH (benign prostatic hyperplasia)   Paroxysmal atrial fibrillation (HCC)   Osteomyelitis of fifth toe of right foot (HCC)   Bacteremia   Acute cystitis without hematuria   PVD (peripheral vascular disease) (HCC)     Sepsis secondary to gram-positive bacteremia Aerococcus/Actinotignum schaalii bacteremia Antibiotic management per infectious disease at this time.  He should be completing his 7-day bacteremia treatment secondary to UTI today but if we need to cover him for his toe.  Currently off antibiotic in anticipation for possible surgical cultures during amputation.   Right foot ulcer Right foot osteomyelitis fifth metatarsal head Lack of poor circulation noted on the ABI.  Vascular team consulted, Dr. Lajoyce Corners following.  Vascular planning on angiogram today  Anemia Hemoglobin stable at 8.9.  Continue to monitor   Peripheral neuropathy History of vitamin B12 deficiency, supplements continue   Progressive weakness Deconditioning but could also be related to ongoing bacteremia. PT/OT recommending SNF on discharge.   Prostate  cancer Patient follows with urology. During this admission, sclerotic focus noted on left second rib and is nonspecific. May need to discuss with IR prior to discharge.   Atrial fibrillation with slow ventricular response Stable currently. Transthoracic Echocardiogram ordered and is significant for mildly dilated left atrium with normal LVEF.  Previous provider discussed anticoagulation with patient's family, currently on hold.   Pressure injury Left heel, right buttocks.  Both present on admission.     DVT prophylaxis: Lovenox Code Status:   Code Status: Full Code Family Communication: Family at bedside Disposition Plan: Discharge home likely in several days pending continued management/workup for bacteremia and foot ulcer/osteomyelitis     Pressure Injury 08/25/22 Heel Left Deep Tissue Pressure Injury - Purple or maroon localized area of discolored intact skin or blood-filled blister due to damage of underlying soft tissue from pressure and/or shear. 6.5cm x 5.5cm (Active)  08/25/22 0730  Location: Heel  Location Orientation: Left  Staging: Deep Tissue Pressure Injury - Purple or maroon localized area of discolored intact skin or blood-filled blister due to damage of underlying soft tissue from pressure and/or shear.  Wound Description (Comments): 6.5cm x 5.5cm  Present on Admission: Yes     Pressure Injury 08/25/22 Buttocks Right Stage 2 -  Partial thickness loss of dermis presenting as a shallow open injury with a red, pink wound bed without slough. (Active)  08/25/22   Location: Buttocks  Location Orientation: Right  Staging: Stage 2 -  Partial thickness loss of dermis presenting as a shallow open injury with a red, pink wound bed without slough.  Wound Description (Comments):   Present on Admission: Yes     Diet Orders (From  admission, onward)     Start     Ordered   09/02/22 0001  Diet NPO time specified  Diet effective midnight        09/01/22 1422             Subjective:  Denies any complaints.  He is ready for his angiogram  Examination: Constitutional: Not in acute distress Respiratory: Clear to auscultation bilaterally Cardiovascular: Normal sinus rhythm, no rubs Abdomen: Nontender nondistended good bowel sounds Musculoskeletal: No edema noted Skin: No rashes seen Neurologic: CN 2-12 grossly intact.  And nonfocal Psychiatric: Normal judgment and insight. Alert and oriented x 3. Normal mood.  Objective: Vitals:   09/01/22 1632 09/01/22 2002 09/02/22 0458 09/02/22 0807  BP: (!) 163/80 (!) 143/105 138/60 139/68  Pulse: 65 68 62 (!) 56  Resp: 16 17 16 17   Temp: 97.8 F (36.6 C) 97.9 F (36.6 C) 97.7 F (36.5 C) 97.6 F (36.4 C)  TempSrc: Oral Oral Oral Oral  SpO2: 98%  100% 100%  Weight:      Height:        Intake/Output Summary (Last 24 hours) at 09/02/2022 0842 Last data filed at 09/02/2022 0300 Gross per 24 hour  Intake 420 ml  Output 100 ml  Net 320 ml   Filed Weights   08/24/22 1414  Weight: 82.1 kg    Scheduled Meds:  cyanocobalamin  1,000 mcg Intramuscular Daily   dextromethorphan-guaiFENesin  1 tablet Oral BID   enoxaparin (LOVENOX) injection  40 mg Subcutaneous Q24H   furosemide  20 mg Intravenous Daily   leptospermum manuka honey  1 Application Topical Daily   multivitamin with minerals  1 tablet Oral Daily   pantoprazole  40 mg Oral Daily   tamsulosin  0.4 mg Oral QPC supper   Continuous Infusions:  Nutritional status     Body mass index is 28.35 kg/m.  Data Reviewed:   CBC: Recent Labs  Lab 08/27/22 0319 08/29/22 0031 08/31/22 1257 09/02/22 0039  WBC 12.7* 8.1 7.3 6.5  HGB 9.5* 8.7* 8.9* 9.1*  HCT 29.2* 26.4* 28.1* 28.6*  MCV 103.2* 101.9* 103.3* 104.0*  PLT 245 241 277 274   Basic Metabolic Panel: Recent Labs  Lab 08/27/22 0319 08/29/22 0031 09/02/22 0039  NA 135 134* 133*  K 3.6 4.0 3.8  CL 103 99 98  CO2 21* 25 25  GLUCOSE 103* 119* 104*  BUN 27* 21 22  CREATININE  1.09 0.99 1.02  CALCIUM 8.6* 8.6* 8.3*   GFR: Estimated Creatinine Clearance: 47.4 mL/min (by C-G formula based on SCr of 1.02 mg/dL). Liver Function Tests: Recent Labs  Lab 08/27/22 0319  AST 33  ALT 18  ALKPHOS 46  BILITOT 0.8  PROT 5.9*  ALBUMIN 2.6*   No results for input(s): "LIPASE", "AMYLASE" in the last 168 hours. No results for input(s): "AMMONIA" in the last 168 hours. Coagulation Profile: No results for input(s): "INR", "PROTIME" in the last 168 hours. Cardiac Enzymes: No results for input(s): "CKTOTAL", "CKMB", "CKMBINDEX", "TROPONINI" in the last 168 hours.  BNP (last 3 results) No results for input(s): "PROBNP" in the last 8760 hours. HbA1C: No results for input(s): "HGBA1C" in the last 72 hours. CBG: No results for input(s): "GLUCAP" in the last 168 hours. Lipid Profile: No results for input(s): "CHOL", "HDL", "LDLCALC", "TRIG", "CHOLHDL", "LDLDIRECT" in the last 72 hours. Thyroid Function Tests: No results for input(s): "TSH", "T4TOTAL", "FREET4", "T3FREE", "THYROIDAB" in the last 72 hours. Anemia Panel: Recent Labs  08/31/22 1257  VITAMINB12 >7,500*  FOLATE 10.5  FERRITIN 501*  TIBC 193*  IRON 115  RETICCTPCT 0.9   Sepsis Labs: Recent Labs  Lab 08/29/22 0031  PROCALCITON 2.71    Recent Results (from the past 240 hour(s))  Urine Culture (for pregnant, neutropenic or urologic patients or patients with an indwelling urinary catheter)     Status: Abnormal   Collection Time: 08/24/22  4:50 PM   Specimen: Urine, Clean Catch  Result Value Ref Range Status   Specimen Description   Final    URINE, CLEAN CATCH Performed at [facility], [address], [place], [place] [address]    Special Requests   Final    NONE Performed at [facility], [address] [place], [place] [address]    Culture (A)  Final    >=100,000 COLONIES/mL AEROCOCCUS SPECIES Standardized susceptibility testing for this organism is not available. Performed at [facility], [address], [place], [place] [address]    Report Status 08/27/2022 FINAL  Final  Resp panel by RT-PCR (RSV, Flu A&B, Covid) Anterior Nasal Swab     Status: None   Collection Time: 08/24/22  9:55 PM   Specimen: Anterior Nasal Swab  Result Value Ref Range Status   SARS Coronavirus 2 by RT PCR NEGATIVE NEGATIVE Final    Comment: (NOTE) SARS-CoV-2 target nucleic acids are NOT DETECTED.  The SARS-CoV-2 RNA is generally detectable in upper respiratory specimens during the acute phase of infection. The lowest concentration of SARS-CoV-2 viral copies this assay can detect is 138 copies/mL. A negative result does not preclude SARS-Cov-2 infection and should not be used as the sole basis for treatment or other patient management decisions. A negative result may occur with  improper specimen collection/handling, submission of specimen other than nasopharyngeal swab, presence of viral mutation(s) within the areas targeted by this assay, and inadequate number of viral copies(<138 copies/mL). A negative result must be combined with clinical observations, patient history, and epidemiological information. The expected result is Negative.  Fact Sheet for Patients:  BloggerCourse.com  Fact Sheet for Healthcare Providers:  SeriousBroker.it  This test is no t yet approved or cleared by the [place] FDA and  has been authorized for detection and/or diagnosis of SARS-CoV-2 by FDA under an Emergency Use Authorization (EUA). This EUA will remain  in effect (meaning this test can be used) for the duration of the COVID-19 declaration under Section 564(b)(1) of the Act, 21 U.S.C.section 360bbb-3(b)(1), unless the authorization is terminated  or revoked sooner.       Influenza A by PCR NEGATIVE NEGATIVE Final   Influenza B by PCR NEGATIVE NEGATIVE Final    Comment: (NOTE) The Xpert Xpress SARS-CoV-2/FLU/RSV plus assay is intended as an  aid in the diagnosis of influenza from Nasopharyngeal swab specimens and should not be used as a sole basis for treatment. Nasal washings and aspirates are unacceptable for Xpert Xpress SARS-CoV-2/FLU/RSV testing.  Fact Sheet for Patients: BloggerCourse.com  Fact Sheet for Healthcare Providers: SeriousBroker.it  This test is not yet approved or cleared by the [place] FDA and has been authorized for detection and/or diagnosis of SARS-CoV-2 by FDA under an Emergency Use Authorization (EUA). This EUA will remain in effect (meaning this test can be used) for the duration of the COVID-19 declaration under Section 564(b)(1) of the Act, 21 U.S.C. section 360bbb-3(b)(1), unless the authorization is terminated or revoked.     Resp Syncytial Virus by PCR NEGATIVE NEGATIVE Final  Comment: (NOTE) Fact Sheet for Patients: BloggerCourse.com  Fact Sheet for Healthcare Providers: SeriousBroker.it  This test is not yet approved or cleared by the [place] FDA and has been authorized for detection and/or diagnosis of SARS-CoV-2 by FDA under an Emergency Use Authorization (EUA). This EUA will remain in effect (meaning this test can be used) for the duration of the COVID-19 declaration under Section 564(b)(1) of the Act, 21 U.S.C. section 360bbb-3(b)(1), unless the authorization is terminated or revoked.  Performed at [facility], [address] [place], [place] [address]   Blood Culture (routine x 2)     Status: Abnormal   Collection Time: 08/24/22 10:01 PM   Specimen: BLOOD  Result Value Ref Range Status   Specimen Description   Final    BLOOD BLOOD LEFT HAND Performed at [facility], [address], [place], [place] [address]    Special Requests   Final    BOTTLES DRAWN AEROBIC AND ANAEROBIC Blood Culture adequate volume Performed at [facility], [address],  [place], [place] [address]    Culture  Setup Time   Final    GRAM POSITIVE COCCI IN BOTH AEROBIC AND ANAEROBIC BOTTLES Gram Stain Report Called to,Read Back By and Verified With: PIPKIN,C ON 08/25/22 AT 2110 BY LOY,C CRITICAL RESULT CALLED TO, READ BACK BY AND VERIFIED WITH: C PIPKINS,RN@0155  11/26/22 [facility] Performed at [facility], [address], [place], [place] [address]    Culture (A)  Final    AEROCOCCUS URINAE Standardized susceptibility testing for this organism is not available. Performed at [facility], [address] [place], [place] [address]    Report Status 08/29/2022 FINAL  Final  Blood Culture ID Panel (Reflexed)     Status: None   Collection Time: 08/24/22 10:01 PM  Result Value Ref Range Status   Enterococcus faecalis NOT DETECTED NOT DETECTED Final   Enterococcus Faecium NOT DETECTED NOT DETECTED Final   Listeria monocytogenes NOT DETECTED NOT DETECTED Final   Staphylococcus species NOT DETECTED NOT DETECTED Final   Staphylococcus aureus (BCID) NOT DETECTED NOT DETECTED Final   Staphylococcus epidermidis NOT DETECTED NOT DETECTED Final   Staphylococcus lugdunensis NOT DETECTED NOT DETECTED Final   Streptococcus species NOT DETECTED NOT DETECTED Final   Streptococcus agalactiae NOT DETECTED NOT DETECTED Final   Streptococcus pneumoniae NOT DETECTED NOT DETECTED Final   Streptococcus pyogenes NOT DETECTED NOT DETECTED Final   A.calcoaceticus-baumannii NOT DETECTED NOT DETECTED Final   Bacteroides fragilis NOT DETECTED NOT DETECTED Final   Enterobacterales NOT DETECTED NOT DETECTED Final   Enterobacter cloacae complex NOT DETECTED NOT DETECTED Final   Escherichia coli NOT DETECTED NOT DETECTED Final   Klebsiella aerogenes NOT DETECTED NOT DETECTED Final   Klebsiella oxytoca NOT DETECTED NOT DETECTED Final   Klebsiella pneumoniae NOT DETECTED NOT DETECTED Final   Proteus species NOT DETECTED NOT DETECTED Final   Salmonella species NOT DETECTED NOT DETECTED Final    Serratia marcescens NOT DETECTED NOT DETECTED Final   Haemophilus influenzae NOT DETECTED NOT DETECTED Final   Neisseria meningitidis NOT DETECTED NOT DETECTED Final   Pseudomonas aeruginosa NOT DETECTED NOT DETECTED Final   Stenotrophomonas maltophilia NOT DETECTED NOT DETECTED Final   Candida albicans NOT DETECTED NOT DETECTED Final   Candida auris NOT DETECTED NOT DETECTED Final   Candida glabrata NOT DETECTED NOT DETECTED Final   Candida krusei NOT DETECTED NOT DETECTED Final   Candida parapsilosis NOT DETECTED NOT DETECTED Final   Candida tropicalis NOT DETECTED NOT  DETECTED Final   Cryptococcus neoformans/gattii NOT DETECTED NOT DETECTED Final    Comment: Performed at [facility], [address], [place], [place] [address]  Blood Culture (routine x 2)     Status: Abnormal   Collection Time: 08/24/22 10:02 PM   Specimen: BLOOD  Result Value Ref Range Status   Specimen Description   Final    BLOOD BLOOD LEFT WRIST Performed at [facility], [address] [place], [place] [address]    Special Requests   Final    BOTTLES DRAWN AEROBIC AND ANAEROBIC Blood Culture adequate volume Performed at [facility], [address], [place], [place] [address]    Culture  Setup Time   Final    GRAM POSITIVE COCCI AEROBIC BOTTLE ONLY CRITICAL VALUE NOTED.  VALUE IS CONSISTENT WITH PREVIOUSLY REPORTED AND CALLED VALUE. GRAM POSITIVE RODS ANAEROBIC BOTTLE ONLY Gram Stain Report Called to,Read Back By and Verified With: MADISON,GINGER @ [phone] ON 08/26/2022 BY FRATTO,ASHLEY Performed at [facility], [address] [place], [place] [address]    Culture (A)  Final    AEROCOCCUS URINAE GRAM POSITIVE RODS Actinotignum schaalii  Standardized susceptibility testing for this organism is not available. Performed at [facility], [address] [place], [place] [address]    Report Status 08/28/2022 FINAL  Final  Culture, blood (Routine X 2) w Reflex to ID Panel     Status: None    Collection Time: 08/26/22 11:50 AM   Specimen: BLOOD RIGHT HAND  Result Value Ref Range Status   Specimen Description BLOOD RIGHT HAND  Final   Special Requests   Final    BOTTLES DRAWN AEROBIC AND ANAEROBIC Blood Culture results may not be optimal due to an excessive volume of blood received in culture bottles   Culture   Final    NO GROWTH 5 DAYS Performed at [facility], [address], [place], [place] [address]    Report Status 08/31/2022 FINAL  Final  Culture, blood (Routine X 2) w Reflex to ID Panel     Status: None   Collection Time: 08/26/22 11:50 AM   Specimen: Right Antecubital; Blood  Result Value Ref Range Status   Specimen Description RIGHT ANTECUBITAL  Final   Special Requests   Final    BOTTLES DRAWN AEROBIC AND ANAEROBIC Blood Culture adequate volume   Culture   Final    NO GROWTH 5 DAYS Performed at [facility], [address], [place], [place] [address]    Report Status 08/31/2022 FINAL  Final  Surgical PCR screen     Status: None   Collection Time: 09/02/22  1:21 AM   Specimen: Nasal Mucosa; Nasal Swab  Result Value Ref Range Status   MRSA, PCR NEGATIVE NEGATIVE Final   Staphylococcus aureus NEGATIVE NEGATIVE Final    Comment: (NOTE) The Xpert SA Assay (FDA approved for NASAL specimens in patients 20 years of age and older), is one component of a comprehensive surveillance program. It is not intended to diagnose infection nor to guide or monitor treatment. Performed at [facility], [address] [place], [place] [address]          Radiology Studies: No results found.         LOS: 8 days   Time spent= 35 mins    Gurshaan Matsuoka Joline Maxcy, MD [facility]  If 7PM-7AM, please contact night-coverage  09/02/2022, 8:42 AM

## 2022-09-02 NOTE — Progress Notes (Signed)
  Progress Note    09/02/2022 7:36 AM * No surgery date entered *  Peripheral artery disease with a necrotic ulcer on fifth metatarsal head with osteomyelitis Scheduled for Angiogram of RLE today with Dr. Chestine Spore His questions regarding procedure were answered this morning Keep NPO Consent order placed   Dory [facility] [phone] 09/02/2022 7:36 AM

## 2022-09-02 NOTE — Care Management Important Message (Signed)
Important Message  Patient Details  Name: Jesse EWTON Sr. MRN: [id number] Date of Birth: 1928/09/21   Medicare Important Message Given:  Yes     Sherilyn Banker 09/02/2022, 2:10 PM

## 2022-09-02 NOTE — Op Note (Signed)
Patient name: Jesse GUARDIA Sr. MRN: [id number] DOB: [birth year] Sex: male  09/02/2022 Pre-operative Diagnosis: Right lower extremity critical ischemia with tissue loss at the toes Post-operative diagnosis:  Same Surgeon:  Victorino Sparrow, MD Procedure Performed: 1.  Ultrasound-guided micropuncture access of the left common femoral artery in retrograde fashion 2.  Aortogram 3.  Second order cannulation, right lower extremity angiogram 4.  Third order cannulation, right lower extremity angiogram from the superficial femoral artery 5.  Third order cannulation right lower extremity angiogram from the posterior tibial artery 6.  Balloon angioplasty of the tibioperoneal trunk 3 x 60 mm balloon 7.  Balloon angioplasty of the posterior tibial artery 2 x 220 mm balloon 8.  Device assisted closure-Pro-glide   Indications: Patient is a 89 year old gentleman who ambulates with the use of a walker with right-sided toe wounds.  He had nonpalpable pulses on physical exam with ABIs demonstrating severe peripheral arterial disease.  After discussing the risk and benefits of right lower extremity angiogram in an effort to define and improve distal perfusion for wound healing, [place] elected to proceed.  Findings:  Aortogram: No flow-limiting stenosis in the aortoiliac segments bilaterally On the right: Widely patent common femoral artery, profunda, superficial femoral artery, popliteal artery.  There is occlusion of the anterior tibial artery.  The tibioperoneal trunk is occluded with reconstitution of the posterior tibial artery distally.  This artery has multiple areas of tandem, flow-limiting stenosis, but runs into the foot.  The peroneal artery is atretic, reconstituting at the mid tibia with flow to the level of the ankle.   Procedure:  The patient was identified in the holding area and taken to room 8.  The patient was then placed supine on the table and prepped and draped in the usual sterile fashion.   A time out was called.  Ultrasound was used to evaluate the left common femoral artery.  It was patent .  A digital ultrasound image was acquired.  A micropuncture needle was used to access the left common femoral artery under ultrasound guidance.  An 018 wire was advanced without resistance and a micropuncture sheath was placed.  The 018 wire was removed and a benson wire was placed.  The micropuncture sheath was exchanged for a 5 french sheath.  An omniflush catheter was advanced over the wire to the level of L-1.  An abdominal angiogram was obtained.  Next, using the omniflush catheter and a benson wire, the aortic bifurcation was crossed and the catheter was placed into theright external iliac artery and right runoff was obtained.    I elected to attempt intervention on the tibioperoneal trunk, posterior tibial artery.  A 6 x 65 cm sheath was brought onto the field and parked in the superficial femoral artery.  Angiography followed to define the tibioperoneal trunk lesion.  The patient was heparinized.  A series of wires and catheters were used to traverse the tibioperoneal trunk occlusion.  True lumen was confirmed with the use of angiography from the posterior tibial artery.  Next, the wire and catheter were used to traverse the multiple, tandem, flow-limiting stenosis appreciated throughout the posterior tibial artery.  A 0.014 wire was used, and this was exchanged for a 0.018 wire for intervention.  A 3 mm x 60 mm angioplasty balloon was brought onto the field and inflated for 3 minutes across the tibioperoneal trunk occlusion.  Next, a 2 x 220 mm balloon was brought onto the field and inflated along the posterior tibial  artery.  Follow-up angiography demonstrated excellent result with recanalization of the tibioperoneal trunk, resolution of flow-limiting stenosis in the posterior tibial artery, recanalization of the peroneal artery.   Impression: Successful recanalization of the tibioperoneal trunk  using balloon angioplasty, successful resolution of flow-limiting stenosis within the posterior tibial artery using balloon angioplasty.  Completion angiography demonstrated widely patent tibioperoneal trunk with two-vessel runoff, peroneal to the level of the ankle, posterior tibial running into the foot and filling plantar branches.  Patient has been maximally revascularized.     Fara Olden, MD [facility] Office: [phone]

## 2022-09-03 DIAGNOSIS — I480 Paroxysmal atrial fibrillation: Secondary | ICD-10-CM | POA: Diagnosis not present

## 2022-09-03 DIAGNOSIS — L97514 Non-pressure chronic ulcer of other part of right foot with necrosis of bone: Secondary | ICD-10-CM | POA: Diagnosis not present

## 2022-09-03 DIAGNOSIS — J90 Pleural effusion, not elsewhere classified: Secondary | ICD-10-CM | POA: Diagnosis not present

## 2022-09-03 DIAGNOSIS — A499 Bacterial infection, unspecified: Secondary | ICD-10-CM | POA: Diagnosis not present

## 2022-09-03 NOTE — H&P (View-Only) (Signed)
Patient ID: Jesse H Wert Sr., male   DOB: 01/31/1929, 89 y.o.   MRN: [id number] Patient is status post revascularization to the right lower extremity.  I will plan for a right foot fifth ray amputation on Wednesday.  Discussed surgical plan with the patient he states he understands wished to proceed. 

## 2022-09-03 NOTE — Progress Notes (Signed)
[facility]  Date of Admission:  08/24/2022      Total days of antibiotics on hold  Unasyn 5/14 - 5/15  Ceftriaxone           ASSESSMENT: Jesse Pollock Sr. is a 89 y.o. male admitted from home for evaluation of generalized weakness, found to have polymicrobial bacteremia due to Aerococcus urinae and Actinotignum schaalii.      Bacteremia, Aerococcus urinae and Actinotignum schaalii -  Treated / resolved.  Most likely this was translocated from bladder source given organism growing in urine as well.      Right Foot Ulceration with underlying OM -  MRI with read confirming suspicion for osteomyelitis. PAD concerning for wound healing with ABI > 0.7. Now s/p angiography with successful revascularization to right leg. Awaiting Dr. Audrie Lia further evaluation regarding amputation for treatment.  Currently stable - abx on hold to increase culture yield if he does go for amputation.      H/O Remote Penicillin Allergy -  > 20 years ago with non-severe reaction described. Tolerated amox challenge 5/13 well. Did well with Unasyn w/o concern for any effects.    Dr. Elinor Parkinson is on over the weekend for ID related questions/concerns. Will plan to see back next week.    PLAN: Continue to hold antibiotics  Over weekend if he were to become unstable or display signs of SIRS concerning for infection, would start Vancomycin + Unasyn for foot wound.      Principal Problem:   Polymicrobial bacterial infection Active Problems:   Fever of undetermined origin   Lactic acidosis   Generalized weakness   Bronchitis   Bilateral pleural effusion   Right foot ulcer (HCC)   Essential hypertension   BPH (benign prostatic hyperplasia)   Paroxysmal atrial fibrillation (HCC)   Osteomyelitis of fifth toe of right foot (HCC)   Bacteremia   Acute cystitis without hematuria   PVD (peripheral vascular disease) (HCC)    aspirin EC  81 mg Oral Daily   atorvastatin   40 mg Oral Daily   clopidogrel  75 mg Oral Q breakfast   cyanocobalamin  1,000 mcg Intramuscular Daily   dextromethorphan-guaiFENesin  1 tablet Oral BID   furosemide  20 mg Intravenous Daily   heparin  5,000 Units Subcutaneous Q8H   leptospermum manuka honey  1 Application Topical Daily   multivitamin with minerals  1 tablet Oral Daily   pantoprazole  40 mg Oral Daily   sodium chloride flush  3 mL Intravenous Q12H   tamsulosin  0.4 mg Oral QPC supper    SUBJECTIVE: Worried about the clock and not being able to see it as much. Little anxious about meal ordering.    Review of Systems: Review of Systems  Unable to perform ROS: Other  Anxiety over other things    Allergies  Allergen Reactions   Codeine Swelling    OBJECTIVE: Vitals:   09/03/22 0407 09/03/22 0500 09/03/22 0600 09/03/22 0800  BP: (!) 115/52   (!) 115/46  Pulse: 63 (!) 54 (!) 56 (!) 42  Resp: 16   19  Temp: (!) 97.3 F (36.3 C)   97.9 F (36.6 C)  TempSrc: Oral   Oral  SpO2: 100%   100%  Weight:      Height:       Body mass index is 28.35 kg/m.  Physical Exam Vitals and nursing note reviewed.  Constitutional:  Appearance: Normal appearance. He is not ill-appearing.  HENT:     Head: Normocephalic.     Mouth/Throat:     Mouth: Mucous membranes are moist.     Pharynx: Oropharynx is clear.  Eyes:     General: No scleral icterus. Cardiovascular:     Rate and Rhythm: Regular rhythm. Bradycardia present.  Pulmonary:     Effort: Pulmonary effort is normal.  Musculoskeletal:        General: Normal range of motion.     Cervical back: Normal range of motion.  Skin:    Coloration: Skin is not jaundiced or pale.  Neurological:     Mental Status: He is alert and oriented to person, place, and time.  Psychiatric:        Mood and Affect: Mood normal.        Judgment: Judgment normal.     Lab Results Lab Results  Component Value Date   WBC 6.5 09/02/2022   HGB 9.1 (L) 09/02/2022   HCT 28.6  (L) 09/02/2022   MCV 104.0 (H) 09/02/2022   PLT 274 09/02/2022    Lab Results  Component Value Date   CREATININE 1.02 09/02/2022   BUN 22 09/02/2022   NA 133 (L) 09/02/2022   K 3.8 09/02/2022   CL 98 09/02/2022   CO2 25 09/02/2022    Lab Results  Component Value Date   ALT 18 08/27/2022   AST 33 08/27/2022   ALKPHOS 46 08/27/2022   BILITOT 0.8 08/27/2022     Microbiology: Recent Results (from the past 240 hour(s))  Urine Culture (for pregnant, neutropenic or urologic patients or patients with an indwelling urinary catheter)     Status: Abnormal   Collection Time: 08/24/22  4:50 PM   Specimen: Urine, Clean Catch  Result Value Ref Range Status   Specimen Description   Final    URINE, CLEAN CATCH Performed at [facility], [address] [place], [place] [address]    Special Requests   Final    NONE Performed at [facility], [address], [place], [place] [address]    Culture (A)  Final    >=100,000 COLONIES/mL AEROCOCCUS SPECIES Standardized susceptibility testing for this organism is not available. Performed at [facility], [address], [place], [place] [address]    Report Status 08/27/2022 FINAL  Final  Resp panel by RT-PCR (RSV, Flu A&B, Covid) Anterior Nasal Swab     Status: None   Collection Time: 08/24/22  9:55 PM   Specimen: Anterior Nasal Swab  Result Value Ref Range Status   SARS Coronavirus 2 by RT PCR NEGATIVE NEGATIVE Final    Comment: (NOTE) SARS-CoV-2 target nucleic acids are NOT DETECTED.  The SARS-CoV-2 RNA is generally detectable in upper respiratory specimens during the acute phase of infection. The lowest concentration of SARS-CoV-2 viral copies this assay can detect is 138 copies/mL. A negative result does not preclude SARS-Cov-2 infection and should not be used as the sole basis for treatment or other patient management decisions. A negative result may occur with  improper specimen collection/handling, submission of specimen  other than nasopharyngeal swab, presence of viral mutation(s) within the areas targeted by this assay, and inadequate number of viral copies(<138 copies/mL). A negative result must be combined with clinical observations, patient history, and epidemiological information. The expected result is Negative.  Fact Sheet for Patients:  BloggerCourse.com  Fact Sheet for Healthcare Providers:  SeriousBroker.it  This test is no t yet approved or cleared  by the [place] and  has been authorized for detection and/or diagnosis of SARS-CoV-2 by FDA under an Emergency Use Authorization (EUA). This EUA will remain  in effect (meaning this test can be used) for the duration of the COVID-19 declaration under Section 564(b)(1) of the Act, 21 U.S.C.section 360bbb-3(b)(1), unless the authorization is terminated  or revoked sooner.       Influenza A by PCR NEGATIVE NEGATIVE Final   Influenza B by PCR NEGATIVE NEGATIVE Final    Comment: (NOTE) The Xpert Xpress SARS-CoV-2/FLU/RSV plus assay is intended as an aid in the diagnosis of influenza from Nasopharyngeal swab specimens and should not be used as a sole basis for treatment. Nasal washings and aspirates are unacceptable for Xpert Xpress SARS-CoV-2/FLU/RSV testing.  Fact Sheet for Patients: BloggerCourse.com  Fact Sheet for Healthcare Providers: SeriousBroker.it  This test is not yet approved or cleared by the [place] FDA and has been authorized for detection and/or diagnosis of SARS-CoV-2 by FDA under an Emergency Use Authorization (EUA). This EUA will remain in effect (meaning this test can be used) for the duration of the COVID-19 declaration under Section 564(b)(1) of the Act, 21 U.S.C. section 360bbb-3(b)(1), unless the authorization is terminated or revoked.     Resp Syncytial Virus by PCR NEGATIVE NEGATIVE Final     Comment: (NOTE) Fact Sheet for Patients: BloggerCourse.com  Fact Sheet for Healthcare Providers: SeriousBroker.it  This test is not yet approved or cleared by the [place] FDA and has been authorized for detection and/or diagnosis of SARS-CoV-2 by FDA under an Emergency Use Authorization (EUA). This EUA will remain in effect (meaning this test can be used) for the duration of the COVID-19 declaration under Section 564(b)(1) of the Act, 21 U.S.C. section 360bbb-3(b)(1), unless the authorization is terminated or revoked.  Performed at [facility], [address], [place], [place] [address]   Blood Culture (routine x 2)     Status: Abnormal   Collection Time: 08/24/22 10:01 PM   Specimen: BLOOD  Result Value Ref Range Status   Specimen Description   Final    BLOOD BLOOD LEFT HAND Performed at [facility], [address] [place], [place] [address]    Special Requests   Final    BOTTLES DRAWN AEROBIC AND ANAEROBIC Blood Culture adequate volume Performed at [facility], [address], [place], [place] [address]    Culture  Setup Time   Final    GRAM POSITIVE COCCI IN BOTH AEROBIC AND ANAEROBIC BOTTLES Gram Stain Report Called to,Read Back By and Verified With: PIPKIN,C ON 08/25/22 AT 2110 BY LOY,C CRITICAL RESULT CALLED TO, READ BACK BY AND VERIFIED WITH: C PIPKINS,[email or web address]  11/26/22 [facility] Performed at [facility], [address] [place], [place] [address]    Culture (A)  Final    AEROCOCCUS URINAE Standardized susceptibility testing for this organism is not available. Performed at [facility], [address], [place], [place] [address]    Report Status 08/29/2022 FINAL  Final  Blood Culture ID Panel (Reflexed)     Status: None   Collection Time: 08/24/22 10:01 PM  Result Value Ref Range Status   Enterococcus faecalis NOT DETECTED NOT DETECTED Final   Enterococcus Faecium NOT DETECTED NOT DETECTED Final   Listeria  monocytogenes NOT DETECTED NOT DETECTED Final   Staphylococcus species NOT DETECTED NOT DETECTED Final   Staphylococcus aureus (BCID) NOT DETECTED NOT DETECTED Final   Staphylococcus epidermidis NOT DETECTED NOT DETECTED Final   Staphylococcus lugdunensis  NOT DETECTED NOT DETECTED Final   Streptococcus species NOT DETECTED NOT DETECTED Final   Streptococcus agalactiae NOT DETECTED NOT DETECTED Final   Streptococcus pneumoniae NOT DETECTED NOT DETECTED Final   Streptococcus pyogenes NOT DETECTED NOT DETECTED Final   A.calcoaceticus-baumannii NOT DETECTED NOT DETECTED Final   Bacteroides fragilis NOT DETECTED NOT DETECTED Final   Enterobacterales NOT DETECTED NOT DETECTED Final   Enterobacter cloacae complex NOT DETECTED NOT DETECTED Final   Escherichia coli NOT DETECTED NOT DETECTED Final   Klebsiella aerogenes NOT DETECTED NOT DETECTED Final   Klebsiella oxytoca NOT DETECTED NOT DETECTED Final   Klebsiella pneumoniae NOT DETECTED NOT DETECTED Final   Proteus species NOT DETECTED NOT DETECTED Final   Salmonella species NOT DETECTED NOT DETECTED Final   Serratia marcescens NOT DETECTED NOT DETECTED Final   Haemophilus influenzae NOT DETECTED NOT DETECTED Final   Neisseria meningitidis NOT DETECTED NOT DETECTED Final   Pseudomonas aeruginosa NOT DETECTED NOT DETECTED Final   Stenotrophomonas maltophilia NOT DETECTED NOT DETECTED Final   Candida albicans NOT DETECTED NOT DETECTED Final   Candida auris NOT DETECTED NOT DETECTED Final   Candida glabrata NOT DETECTED NOT DETECTED Final   Candida krusei NOT DETECTED NOT DETECTED Final   Candida parapsilosis NOT DETECTED NOT DETECTED Final   Candida tropicalis NOT DETECTED NOT DETECTED Final   Cryptococcus neoformans/gattii NOT DETECTED NOT DETECTED Final    Comment: Performed at [facility], [address], [place], [place] [address]  Blood Culture (routine x 2)     Status: Abnormal   Collection Time: 08/24/22 10:02 PM   Specimen:  BLOOD  Result Value Ref Range Status   Specimen Description   Final    BLOOD BLOOD LEFT WRIST Performed at [facility], [address], [place], [place] [address]    Special Requests   Final    BOTTLES DRAWN AEROBIC AND ANAEROBIC Blood Culture adequate volume Performed at [facility], [address], [place], [place] [address]    Culture  Setup Time   Final    GRAM POSITIVE COCCI AEROBIC BOTTLE ONLY CRITICAL VALUE NOTED.  VALUE IS CONSISTENT WITH PREVIOUSLY REPORTED AND CALLED VALUE. GRAM POSITIVE RODS ANAEROBIC BOTTLE ONLY Gram Stain Report Called to,Read Back By and Verified With: MADISON,GINGER @ [phone] ON 08/26/2022 BY FRATTO,ASHLEY Performed at [facility], [address], [place], [place] [address]    Culture (A)  Final    AEROCOCCUS URINAE GRAM POSITIVE RODS Actinotignum schaalii  Standardized susceptibility testing for this organism is not available. Performed at [facility], [address] [place], [place] [address]    Report Status 08/28/2022 FINAL  Final  Culture, blood (Routine X 2) w Reflex to ID Panel     Status: None   Collection Time: 08/26/22 11:50 AM   Specimen: BLOOD RIGHT HAND  Result Value Ref Range Status   Specimen Description BLOOD RIGHT HAND  Final   Special Requests   Final    BOTTLES DRAWN AEROBIC AND ANAEROBIC Blood Culture results may not be optimal due to an excessive volume of blood received in culture bottles   Culture   Final    NO GROWTH 5 DAYS Performed at [facility], [address], [place], [place] [address]    Report Status 08/31/2022 FINAL  Final  Culture, blood (Routine X 2) w Reflex to ID Panel     Status: None   Collection Time: 08/26/22 11:50 AM   Specimen: Right Antecubital; Blood  Result Value  Ref Range Status   Specimen Description RIGHT ANTECUBITAL  Final   Special Requests   Final    BOTTLES DRAWN AEROBIC AND ANAEROBIC Blood Culture adequate volume   Culture   Final    NO GROWTH 5 DAYS Performed at [facility], [address], [place], [place] [address]    Report Status 08/31/2022 FINAL  Final  Surgical PCR screen     Status: None   Collection Time: 09/02/22  1:21 AM   Specimen: Nasal Mucosa; Nasal Swab  Result Value Ref Range Status   MRSA, PCR NEGATIVE NEGATIVE Final   Staphylococcus aureus NEGATIVE NEGATIVE Final    Comment: (NOTE) The Xpert SA Assay (FDA approved for NASAL specimens in patients 14 years of age and older), is one component of a comprehensive surveillance program. It is not intended to diagnose infection nor to guide or monitor treatment. Performed at [facility], [address] [place], [place] [address]      Rexene Alberts, MSN, NP-C [facility]  [email or web address] Pager: [phone] Office: [phone] [facility] Main Line: [phone] *Secure Chat Communication Welcome   Total Encounter Time: 20 m

## 2022-09-03 NOTE — Progress Notes (Signed)
Occupational Therapy Treatment Patient Details Name: Jesse LYNG Sr. MRN: [id number] DOB: 21-Jan-1929 Today's Date: 09/03/2022   History of present illness 89 y.o. male [facility] due to generalized progressive weakness. 5/8 found to be febrile with leukocytosis, started on antibiotics for sepsis with unclear source. 5/9 Transferred to [facility] hospital found to have gram-positive cocci bacteremia with possible R foot osteomyelitis. PMH includes hypertension, BPH, prostate cancer   OT comments  Patient with fair progress toward patient focused goals.  Initially needing Max A for supine to sit, preparing for use of Stedy to recliner, but patient having sensation of BM.  Patient decliner Stedy to [facility], and returned to bed and placed on bedpan.  NT notified, able to sit EOB for light grooming with Min Guard.  Patient continues to express discomfort to R hip with movement.  OT continues to be indicated in the acute setting to address deficits, and Patient will benefit from continued inpatient follow up therapy, <3 hours/day    Recommendations for follow up therapy are one component of a multi-disciplinary discharge planning process, led by the attending physician.  Recommendations may be updated based on patient status, additional functional criteria and insurance authorization.    Assistance Recommended at Discharge Frequent or constant Supervision/Assistance  Patient can return home with the following  A lot of help with walking and/or transfers;A lot of help with bathing/dressing/bathroom;Assistance with cooking/housework;Assist for transportation;Help with stairs or ramp for entrance   Equipment Recommendations  None recommended by OT    Recommendations for Other Services      Precautions / Restrictions Precautions Precautions: Fall Precaution Comments: Stedy to stand Restrictions Weight Bearing Restrictions: No Other Position/Activity Restrictions: HOH       Mobility  Bed Mobility Overal bed mobility: Needs Assistance Bed Mobility: Supine to Sit, Sit to Supine, Rolling Rolling: Min assist   Supine to sit: Max assist, HOB elevated Sit to supine: Max assist        Transfers                         Balance Overall balance assessment: Needs assistance Sitting-balance support: Bilateral upper extremity supported, Feet supported Sitting balance-Leahy Scale: Fair   Postural control: Posterior lean                                 ADL either performed or assessed with clinical judgement   ADL       Grooming: Min guard;Sitting           Upper Body Dressing : Minimal assistance;Min guard;Sitting   Lower Body Dressing: Maximal assistance;Sitting/lateral leans                      Extremity/Trunk Assessment Upper Extremity Assessment Upper Extremity Assessment: Generalized weakness   Lower Extremity Assessment Lower Extremity Assessment: Defer to PT evaluation   Cervical / Trunk Assessment Cervical / Trunk Assessment: Kyphotic    Vision       Perception     Praxis      Cognition Arousal/Alertness: Awake/alert Behavior During Therapy: WFL for tasks assessed/performed Overall Cognitive Status: Within Functional Limits for tasks assessed  Pertinent Vitals/ Pain       Pain Assessment Faces Pain Scale: Hurts a little bit Pain Location: R hip movement Pain Descriptors / Indicators: Tender Pain Intervention(s): Monitored during session                                                          Frequency  Min 1X/week        Progress Toward Goals  OT Goals(current goals can now be found in the care plan section)  Progress towards OT goals: Progressing toward goals  Acute Rehab OT Goals OT Goal Formulation: With patient Time For Goal Achievement: 09/08/22 Potential to Achieve  Goals: Fair  Plan Discharge plan remains appropriate    Co-evaluation                 AM-PAC OT "6 Clicks" Daily Activity     Outcome Measure   Help from another person eating meals?: None Help from another person taking care of personal grooming?: A Little Help from another person toileting, which includes using toliet, bedpan, or urinal?: A Lot Help from another person bathing (including washing, rinsing, drying)?: A Lot Help from another person to put on and taking off regular upper body clothing?: A Lot Help from another person to put on and taking off regular lower body clothing?: Total 6 Click Score: 14    End of Session    OT Visit Diagnosis: Unsteadiness on feet (R26.81);Other abnormalities of gait and mobility (R26.89);Muscle weakness (generalized) (M62.81)   Activity Tolerance Patient tolerated treatment well   Patient Left in bed;with call bell/phone within reach   Nurse Communication Other (comment);Mobility status (placed on bedpan)        Time: 4098-1191 OT Time Calculation (min): 20 min  Charges: OT General Charges $OT Visit: 1 Visit OT Treatments $Self Care/Home Management : 8-22 mins  09/03/2022  RP, OTR/L  Acute Rehabilitation Services  Office:  [phone]   Suzanna Obey 09/03/2022, 11:39 AM

## 2022-09-03 NOTE — Progress Notes (Signed)
Patient ID: Jesse Pollock Sr., male   DOB: February 04, 1929, 89 y.o.   MRN: [id number] Patient is status post revascularization to the right lower extremity.  I will plan for a right foot fifth ray amputation on Wednesday.  Discussed surgical plan with the patient he states he understands wished to proceed.

## 2022-09-03 NOTE — Progress Notes (Signed)
PROGRESS NOTE    KARMEL REHFELDT Sr.  ZOX:[id number] DOB: October 15, 1928 DOA: 08/24/2022 PCP: Elfredia Nevins, MD   Brief Narrative:  89 y.o. male with a history of hypertension, BPH, prostate cancer. Patient presented secondary to generalized weakness with evidence of sepsis on admission. Patient was started on empiric antibiotics for found to have gram-positive cocci bacteremia. Patient also noted to have a right foot ulcer concerning for possible osteomyelitis.  ID, Dr. Lajoyce Corners and vascular were consulted.  Currently on IV antibiotics.  ABIs were abnormal therefore angiogram performed by vascular 5/16 with successful recannulization/angioplasty. Dr Lajoyce Corners notified so we can proceed with further plans   Assessment & Plan:  Principal Problem:   Polymicrobial bacterial infection Active Problems:   Fever of undetermined origin   Lactic acidosis   Generalized weakness   Bronchitis   Bilateral pleural effusion   Right foot ulcer (HCC)   Essential hypertension   BPH (benign prostatic hyperplasia)   Paroxysmal atrial fibrillation (HCC)   Osteomyelitis of fifth toe of right foot (HCC)   Bacteremia   Acute cystitis without hematuria   PVD (peripheral vascular disease) (HCC)     Sepsis secondary to gram-positive bacteremia Aerococcus/Actinotignum schaalii bacteremia Antibiotic management per infectious disease at this time.  He should be completing his 7-day bacteremia treatment secondary to UTI today but if we need to cover him for his toe.  Currently off antibiotic in anticipation for possible surgical cultures during amputation.   Right foot ulcer Right foot osteomyelitis fifth metatarsal head Lack of poor circulation noted on the ABI.  Status post angiogram/recannulization.  Will defer further management to orthopedic; Dr Lajoyce Corners notified.   Anemia Hemoglobin stable at 8.9.  Continue to monitor   Peripheral neuropathy History of vitamin B12 deficiency, supplements continue   Progressive  weakness Deconditioning but could also be related to ongoing bacteremia. PT/OT recommending SNF on discharge.   Prostate cancer Patient follows with urology. During this admission, sclerotic focus noted on left second rib and is nonspecific. May need to discuss with IR prior to discharge.   Atrial fibrillation with slow ventricular response Stable currently. Transthoracic Echocardiogram ordered and is significant for mildly dilated left atrium with normal LVEF.  Previous provider discussed anticoagulation with patient's family, currently on hold.   Pressure injury Left heel, right buttocks.  Both present on admission.     DVT prophylaxis: Lovenox Code Status:   Code Status: Full Code Family Communication: Family at bedside Disposition Plan: Further inpatient plans per orthopedic     Pressure Injury 08/25/22 Heel Left Deep Tissue Pressure Injury - Purple or maroon localized area of discolored intact skin or blood-filled blister due to damage of underlying soft tissue from pressure and/or shear. 6.5cm x 5.5cm (Active)  08/25/22 0730  Location: Heel  Location Orientation: Left  Staging: Deep Tissue Pressure Injury - Purple or maroon localized area of discolored intact skin or blood-filled blister due to damage of underlying soft tissue from pressure and/or shear.  Wound Description (Comments): 6.5cm x 5.5cm  Present on Admission: Yes     Pressure Injury 08/25/22 Buttocks Right Stage 2 -  Partial thickness loss of dermis presenting as a shallow open injury with a red, pink wound bed without slough. (Active)  08/25/22   Location: Buttocks  Location Orientation: Right  Staging: Stage 2 -  Partial thickness loss of dermis presenting as a shallow open injury with a red, pink wound bed without slough.  Wound Description (Comments):   Present on Admission: Yes  Diet Orders (From admission, onward)     Start     Ordered   09/02/22 1607  Diet Heart Room service appropriate? Yes with  Assist; Fluid consistency: Thin  Diet effective now       Question Answer Comment  Room service appropriate? Yes with Assist   Fluid consistency: Thin      09/02/22 1606            Subjective: Seen and bedside.  No complaints  Examination: Constitutional: Not in acute distress Respiratory: Clear to auscultation bilaterally Cardiovascular: Normal sinus rhythm, no rubs Abdomen: Nontender nondistended good bowel sounds Musculoskeletal: No edema noted Skin: No rashes seen Neurologic: CN 2-12 grossly intact.  And nonfocal Psychiatric: Normal judgment and insight. Alert and oriented x 3. Normal mood.  Objective: Vitals:   09/03/22 0407 09/03/22 0500 09/03/22 0600 09/03/22 0800  BP: (!) 115/52   (!) 115/46  Pulse: 63 (!) 54 (!) 56 (!) 42  Resp: 16   19  Temp: (!) 97.3 F (36.3 C)     TempSrc: Oral     SpO2: 100%   100%  Weight:      Height:        Intake/Output Summary (Last 24 hours) at 09/03/2022 0844 Last data filed at 09/03/2022 0520 Gross per 24 hour  Intake 510 ml  Output 1400 ml  Net -890 ml   Filed Weights   08/24/22 1414  Weight: 82.1 kg    Scheduled Meds:  aspirin EC  81 mg Oral Daily   atorvastatin  40 mg Oral Daily   clopidogrel  75 mg Oral Q breakfast   cyanocobalamin  1,000 mcg Intramuscular Daily   dextromethorphan-guaiFENesin  1 tablet Oral BID   furosemide  20 mg Intravenous Daily   heparin  5,000 Units Subcutaneous Q8H   leptospermum manuka honey  1 Application Topical Daily   multivitamin with minerals  1 tablet Oral Daily   pantoprazole  40 mg Oral Daily   sodium chloride flush  3 mL Intravenous Q12H   tamsulosin  0.4 mg Oral QPC supper   Continuous Infusions:  sodium chloride      Nutritional status     Body mass index is 28.35 kg/m.  Data Reviewed:   CBC: Recent Labs  Lab 08/29/22 0031 08/31/22 1257 09/02/22 0039  WBC 8.1 7.3 6.5  HGB 8.7* 8.9* 9.1*  HCT 26.4* 28.1* 28.6*  MCV 101.9* 103.3* 104.0*  PLT 241 277 274    Basic Metabolic Panel: Recent Labs  Lab 08/29/22 0031 09/02/22 0039  NA 134* 133*  K 4.0 3.8  CL 99 98  CO2 25 25  GLUCOSE 119* 104*  BUN 21 22  CREATININE 0.99 1.02  CALCIUM 8.6* 8.3*   GFR: Estimated Creatinine Clearance: 47.4 mL/min (by C-G formula based on SCr of 1.02 mg/dL). Liver Function Tests: No results for input(s): "AST", "ALT", "ALKPHOS", "BILITOT", "PROT", "ALBUMIN" in the last 168 hours.  No results for input(s): "LIPASE", "AMYLASE" in the last 168 hours. No results for input(s): "AMMONIA" in the last 168 hours. Coagulation Profile: No results for input(s): "INR", "PROTIME" in the last 168 hours. Cardiac Enzymes: No results for input(s): "CKTOTAL", "CKMB", "CKMBINDEX", "TROPONINI" in the last 168 hours.  BNP (last 3 results) No results for input(s): "PROBNP" in the last 8760 hours. HbA1C: No results for input(s): "HGBA1C" in the last 72 hours. CBG: No results for input(s): "GLUCAP" in the last 168 hours. Lipid Profile: No results for input(s): "CHOL", "HDL", "LDLCALC", "  TRIG", "CHOLHDL", "LDLDIRECT" in the last 72 hours. Thyroid Function Tests: No results for input(s): "TSH", "T4TOTAL", "FREET4", "T3FREE", "THYROIDAB" in the last 72 hours. Anemia Panel: Recent Labs    08/31/22 1257  VITAMINB12 >7,500*  FOLATE 10.5  FERRITIN 501*  TIBC 193*  IRON 115  RETICCTPCT 0.9   Sepsis Labs: Recent Labs  Lab 08/29/22 0031  PROCALCITON 2.71    Recent Results (from the past 240 hour(s))  Urine Culture (for pregnant, neutropenic or urologic patients or patients with an indwelling urinary catheter)     Status: Abnormal   Collection Time: 08/24/22  4:50 PM   Specimen: Urine, Clean Catch  Result Value Ref Range Status   Specimen Description   Final    URINE, CLEAN CATCH Performed at [facility], [address] [place], [place] [address]    Special Requests   Final    NONE Performed at [facility], [address], [place], [place] [address]     Culture (A)  Final    >=100,000 COLONIES/mL AEROCOCCUS SPECIES Standardized susceptibility testing for this organism is not available. Performed at [facility], [address], [place], [place] [address]    Report Status 08/27/2022 FINAL  Final  Resp panel by RT-PCR (RSV, Flu A&B, Covid) Anterior Nasal Swab     Status: None   Collection Time: 08/24/22  9:55 PM   Specimen: Anterior Nasal Swab  Result Value Ref Range Status   SARS Coronavirus 2 by RT PCR NEGATIVE NEGATIVE Final    Comment: (NOTE) SARS-CoV-2 target nucleic acids are NOT DETECTED.  The SARS-CoV-2 RNA is generally detectable in upper respiratory specimens during the acute phase of infection. The lowest concentration of SARS-CoV-2 viral copies this assay can detect is 138 copies/mL. A negative result does not preclude SARS-Cov-2 infection and should not be used as the sole basis for treatment or other patient management decisions. A negative result may occur with  improper specimen collection/handling, submission of specimen other than nasopharyngeal swab, presence of viral mutation(s) within the areas targeted by this assay, and inadequate number of viral copies(<138 copies/mL). A negative result must be combined with clinical observations, patient history, and epidemiological information. The expected result is Negative.  Fact Sheet for Patients:  BloggerCourse.com  Fact Sheet for Healthcare Providers:  SeriousBroker.it  This test is no t yet approved or cleared by the [place] FDA and  has been authorized for detection and/or diagnosis of SARS-CoV-2 by FDA under an Emergency Use Authorization (EUA). This EUA will remain  in effect (meaning this test can be used) for the duration of the COVID-19 declaration under Section 564(b)(1) of the Act, 21 U.S.C.section 360bbb-3(b)(1), unless the authorization is terminated  or revoked sooner.       Influenza A  by PCR NEGATIVE NEGATIVE Final   Influenza B by PCR NEGATIVE NEGATIVE Final    Comment: (NOTE) The Xpert Xpress SARS-CoV-2/FLU/RSV plus assay is intended as an aid in the diagnosis of influenza from Nasopharyngeal swab specimens and should not be used as a sole basis for treatment. Nasal washings and aspirates are unacceptable for Xpert Xpress SARS-CoV-2/FLU/RSV testing.  Fact Sheet for Patients: BloggerCourse.com  Fact Sheet for Healthcare Providers: SeriousBroker.it  This test is not yet approved or cleared by the [place] FDA and has been authorized for detection and/or diagnosis of SARS-CoV-2 by FDA under an Emergency Use Authorization (EUA). This EUA will remain in effect (meaning this test can be used) for the duration of the COVID-19  declaration under Section 564(b)(1) of the Act, 21 U.S.C. section 360bbb-3(b)(1), unless the authorization is terminated or revoked.     Resp Syncytial Virus by PCR NEGATIVE NEGATIVE Final    Comment: (NOTE) Fact Sheet for Patients: BloggerCourse.com  Fact Sheet for Healthcare Providers: SeriousBroker.it  This test is not yet approved or cleared by the [place] FDA and has been authorized for detection and/or diagnosis of SARS-CoV-2 by FDA under an Emergency Use Authorization (EUA). This EUA will remain in effect (meaning this test can be used) for the duration of the COVID-19 declaration under Section 564(b)(1) of the Act, 21 U.S.C. section 360bbb-3(b)(1), unless the authorization is terminated or revoked.  Performed at [facility], [address] [place], [place] [address]   Blood Culture (routine x 2)     Status: Abnormal   Collection Time: 08/24/22 10:01 PM   Specimen: BLOOD  Result Value Ref Range Status   Specimen Description   Final    BLOOD BLOOD LEFT HAND Performed at [facility], [address], [place], [place]  [address]    Special Requests   Final    BOTTLES DRAWN AEROBIC AND ANAEROBIC Blood Culture adequate volume Performed at [facility], [address], [place], [place] [address]    Culture  Setup Time   Final    GRAM POSITIVE COCCI IN BOTH AEROBIC AND ANAEROBIC BOTTLES Gram Stain Report Called to,Read Back By and Verified With: PIPKIN,C ON 08/25/22 AT 2110 BY LOY,C CRITICAL RESULT CALLED TO, READ BACK BY AND VERIFIED WITH: C PIPKINS,RN@[email or web address]  11/26/22 [facility] Performed at [facility], [address] [place], [place] [address]    Culture (A)  Final    AEROCOCCUS URINAE Standardized susceptibility testing for this organism is not available. Performed at [facility], [address] [place], [place] [address]    Report Status 08/29/2022 FINAL  Final  Blood Culture ID Panel (Reflexed)     Status: None   Collection Time: 08/24/22 10:01 PM  Result Value Ref Range Status   Enterococcus faecalis NOT DETECTED NOT DETECTED Final   Enterococcus Faecium NOT DETECTED NOT DETECTED Final   Listeria monocytogenes NOT DETECTED NOT DETECTED Final   Staphylococcus species NOT DETECTED NOT DETECTED Final   Staphylococcus aureus (BCID) NOT DETECTED NOT DETECTED Final   Staphylococcus epidermidis NOT DETECTED NOT DETECTED Final   Staphylococcus lugdunensis NOT DETECTED NOT DETECTED Final   Streptococcus species NOT DETECTED NOT DETECTED Final   Streptococcus agalactiae NOT DETECTED NOT DETECTED Final   Streptococcus pneumoniae NOT DETECTED NOT DETECTED Final   Streptococcus pyogenes NOT DETECTED NOT DETECTED Final   A.calcoaceticus-baumannii NOT DETECTED NOT DETECTED Final   Bacteroides fragilis NOT DETECTED NOT DETECTED Final   Enterobacterales NOT DETECTED NOT DETECTED Final   Enterobacter cloacae complex NOT DETECTED NOT DETECTED Final   Escherichia coli NOT DETECTED NOT DETECTED Final   Klebsiella aerogenes NOT DETECTED NOT DETECTED Final   Klebsiella oxytoca NOT DETECTED NOT DETECTED Final    Klebsiella pneumoniae NOT DETECTED NOT DETECTED Final   Proteus species NOT DETECTED NOT DETECTED Final   Salmonella species NOT DETECTED NOT DETECTED Final   Serratia marcescens NOT DETECTED NOT DETECTED Final   Haemophilus influenzae NOT DETECTED NOT DETECTED Final   Neisseria meningitidis NOT DETECTED NOT DETECTED Final   Pseudomonas aeruginosa NOT DETECTED NOT DETECTED Final   Stenotrophomonas maltophilia NOT DETECTED NOT DETECTED Final   Candida albicans NOT DETECTED NOT DETECTED Final   Candida auris NOT DETECTED NOT DETECTED Final  Candida glabrata NOT DETECTED NOT DETECTED Final   Candida krusei NOT DETECTED NOT DETECTED Final   Candida parapsilosis NOT DETECTED NOT DETECTED Final   Candida tropicalis NOT DETECTED NOT DETECTED Final   Cryptococcus neoformans/gattii NOT DETECTED NOT DETECTED Final    Comment: Performed at [facility], [address] [place], [place] [address]  Blood Culture (routine x 2)     Status: Abnormal   Collection Time: 08/24/22 10:02 PM   Specimen: BLOOD  Result Value Ref Range Status   Specimen Description   Final    BLOOD BLOOD LEFT WRIST Performed at [facility], [address] [place], [place] [address]    Special Requests   Final    BOTTLES DRAWN AEROBIC AND ANAEROBIC Blood Culture adequate volume Performed at [facility], [address] [place], [place] [address]    Culture  Setup Time   Final    GRAM POSITIVE COCCI AEROBIC BOTTLE ONLY CRITICAL VALUE NOTED.  VALUE IS CONSISTENT WITH PREVIOUSLY REPORTED AND CALLED VALUE. GRAM POSITIVE RODS ANAEROBIC BOTTLE ONLY Gram Stain Report Called to,Read Back By and Verified With: MADISON,GINGER @ [phone] ON 08/26/2022 BY FRATTO,ASHLEY Performed at [facility], [address], [place], [place] [address]    Culture (A)  Final    AEROCOCCUS URINAE GRAM POSITIVE RODS Actinotignum schaalii  Standardized susceptibility testing for this organism is not available. Performed at [facility], [address] [place], [place] [address]    Report Status 08/28/2022 FINAL  Final  Culture, blood (Routine X 2) w Reflex to ID Panel     Status: None   Collection Time: 08/26/22 11:50 AM   Specimen: BLOOD RIGHT HAND  Result Value Ref Range Status   Specimen Description BLOOD RIGHT HAND  Final   Special Requests   Final    BOTTLES DRAWN AEROBIC AND ANAEROBIC Blood Culture results may not be optimal due to an excessive volume of blood received in culture bottles   Culture   Final    NO GROWTH 5 DAYS Performed at [facility], [address], [place], [place] [address]    Report Status 08/31/2022 FINAL  Final  Culture, blood (Routine X 2) w Reflex to ID Panel     Status: None   Collection Time: 08/26/22 11:50 AM   Specimen: Right Antecubital; Blood  Result Value Ref Range Status   Specimen Description RIGHT ANTECUBITAL  Final   Special Requests   Final    BOTTLES DRAWN AEROBIC AND ANAEROBIC Blood Culture adequate volume   Culture   Final    NO GROWTH 5 DAYS Performed at [facility], [address], [place], [place] [address]    Report Status 08/31/2022 FINAL  Final  Surgical PCR screen     Status: None   Collection Time: 09/02/22  1:21 AM   Specimen: Nasal Mucosa; Nasal Swab  Result Value Ref Range Status   MRSA, PCR NEGATIVE NEGATIVE Final   Staphylococcus aureus NEGATIVE NEGATIVE Final    Comment: (NOTE) The Xpert SA Assay (FDA approved for NASAL specimens in patients 70 years of age and older), is one component of a comprehensive surveillance program. It is not intended to diagnose infection nor to guide or monitor treatment. Performed at [facility], [address], [place], [place] [address]          Radiology Studies: No results found.         LOS: 9 days   Time spent= 35 mins  Chanz Cahall Joline Maxcy, MD [facility]  If 7PM-7AM, please contact night-coverage  09/03/2022, 8:44 AM

## 2022-09-03 NOTE — Progress Notes (Signed)
[facility]  Subjective  - No new complaint   Objective (!) 115/52 (!) 56 (!) 97.3 F (36.3 C) (Oral) 16 100%  Intake/Output Summary (Last 24 hours) at 09/03/2022 0705 Last data filed at 09/03/2022 0520 Gross per 24 hour  Intake 510 ml  Output 1400 ml  Net -890 ml    Left groin soft without hematoma Doppler signal brisk PT intact  DR. Lajoyce Corners following 5th MTP wound Lungs non labored breathing  Assessment/Planning: PAD with tissue loss POD #1 angiogram s/p Successful recanalization of the tibioperoneal trunk using balloon angioplasty, successful resolution of flow-limiting stenosis within the posterior tibial artery using balloon angioplasty.   Patient has been maximally revascularized.   Afebrile uncontrolled BP systolic 200-115 Cr WNL Urine OP 1400 Maximum medical therapy with Plavix, ASA and Statin daily F/U with our office will be arranged in 4 weeks     Mosetta Pigeon 09/03/2022 7:05 AM --  Laboratory Lab Results: Recent Labs    08/31/22 1257 09/02/22 0039  WBC 7.3 6.5  HGB 8.9* 9.1*  HCT 28.1* 28.6*  PLT 277 274   BMET Recent Labs    09/02/22 0039  NA 133*  K 3.8  CL 98  CO2 25  GLUCOSE 104*  BUN 22  CREATININE 1.02  CALCIUM 8.3*    COAG No results found for: "INR", "PROTIME" No results found for: "PTT"

## 2022-09-04 DIAGNOSIS — A499 Bacterial infection, unspecified: Secondary | ICD-10-CM | POA: Diagnosis not present

## 2022-09-04 NOTE — Progress Notes (Signed)
PROGRESS NOTE    Jesse BIELAK Sr.  ZOX:[id number] DOB: Jan 27, 1929 DOA: 08/24/2022 PCP: Elfredia Nevins, MD   Brief Narrative:  89 y.o. male with a history of hypertension, BPH, prostate cancer. Patient presented secondary to generalized weakness with evidence of sepsis on admission. Patient was started on empiric antibiotics for found to have gram-positive cocci bacteremia. Patient also noted to have a right foot ulcer concerning for possible osteomyelitis.  ID, Dr. Lajoyce Corners and vascular were consulted.  Currently on IV antibiotics.  ABIs were abnormal therefore angiogram performed by vascular 5/16 with successful recannulization/angioplasty. Dr Lajoyce Corners notified so we can proceed with further plans  09/04/2022: Patient seen.  No new complaints.  Orthopedic team plans right foot fifth ray amputation on Wednesday.  Patient underwent balloon angioplasty of the posterior tibial artery by the vascular surgery team.   Assessment & Plan:  Principal Problem:   Polymicrobial bacterial infection Active Problems:   Fever of undetermined origin   Lactic acidosis   Generalized weakness   Bronchitis   Bilateral pleural effusion   Right foot ulcer (HCC)   Essential hypertension   BPH (benign prostatic hyperplasia)   Paroxysmal atrial fibrillation (HCC)   Osteomyelitis of fifth toe of right foot (HCC)   Bacteremia   Acute cystitis without hematuria   PVD (peripheral vascular disease) (HCC)     Sepsis secondary to gram-positive bacteremia Aerococcus/Actinotignum schaalii bacteremia Antibiotic management per infectious disease at this time.  He should be completing his 7-day bacteremia treatment secondary to UTI today but if we need to cover him for his toe.  Currently off antibiotic in anticipation for possible surgical cultures during amputation. 09/04/2022: Sepsis physiology has resolved.  Infectious disease input is highly appreciated.   Right foot ulcer Right foot osteomyelitis fifth metatarsal  head Lack of poor circulation noted on the ABI.  Status post angiogram/recannulization.  Will defer further management to orthopedic; Dr Lajoyce Corners notified.  09/04/2022: For surgery on Wednesday.  See above documentation.  Anemia Last hemoglobin was 9.1 g/dL.     Peripheral neuropathy History of vitamin B12 deficiency, supplements continue   Progressive weakness Deconditioning but could also be related to ongoing bacteremia. PT/OT recommending SNF on discharge.   Prostate cancer Patient follows with urology. During this admission, sclerotic focus noted on left second rib and is nonspecific. May need to discuss with IR prior to discharge.   Atrial fibrillation with slow ventricular response Stable currently. Transthoracic Echocardiogram ordered and is significant for mildly dilated left atrium with normal LVEF.  Previous provider discussed anticoagulation with patient's family, currently on hold.   Pressure injury Left heel, right buttocks.  Both present on admission.     DVT prophylaxis: Lovenox Code Status:   Code Status: Full Code Family Communication: Family at bedside Disposition Plan: Further inpatient plans per orthopedic     Pressure Injury 08/25/22 Heel Left Deep Tissue Pressure Injury - Purple or maroon localized area of discolored intact skin or blood-filled blister due to damage of underlying soft tissue from pressure and/or shear. 6.5cm x 5.5cm (Active)  08/25/22 0730  Location: Heel  Location Orientation: Left  Staging: Deep Tissue Pressure Injury - Purple or maroon localized area of discolored intact skin or blood-filled blister due to damage of underlying soft tissue from pressure and/or shear.  Wound Description (Comments): 6.5cm x 5.5cm  Present on Admission: Yes     Pressure Injury 08/25/22 Buttocks Right Stage 2 -  Partial thickness loss of dermis presenting as a shallow open injury with  a red, pink wound bed without slough. (Active)  08/25/22   Location: Buttocks   Location Orientation: Right  Staging: Stage 2 -  Partial thickness loss of dermis presenting as a shallow open injury with a red, pink wound bed without slough.  Wound Description (Comments):   Present on Admission: Yes     Diet Orders (From admission, onward)     Start     Ordered   09/02/22 1607  Diet Heart Room service appropriate? Yes with Assist; Fluid consistency: Thin  Diet effective now       Question Answer Comment  Room service appropriate? Yes with Assist   Fluid consistency: Thin      09/02/22 1606            Subjective: No new complaints. No fever or chills.  Examination: Constitutional: Patient is obese.  Not in acute distress Respiratory: Clear to auscultation  Cardiovascular: S1-S2.   Abdomen: Obese, soft and nontender.   Musculoskeletal: Fullness of the ankle versus edema. Neurologic: Awake and alert. Objective: Vitals:   09/03/22 2352 09/04/22 0300 09/04/22 0408 09/04/22 0823  BP: (!) 111/50  (!) 115/54 128/61  Pulse: (!) 51 (!) 46 (!) 43 (!) 56  Resp: 16  15 20   Temp: 97.6 F (36.4 C)  97.8 F (36.6 C)   TempSrc: Oral  Oral   SpO2: 100%  99% 100%  Weight:      Height:        Intake/Output Summary (Last 24 hours) at 09/04/2022 1046 Last data filed at 09/04/2022 0550 Gross per 24 hour  Intake 490 ml  Output 500 ml  Net -10 ml    Filed Weights   08/24/22 1414  Weight: 82.1 kg    Scheduled Meds:  aspirin EC  81 mg Oral Daily   atorvastatin  40 mg Oral Daily   clopidogrel  75 mg Oral Q breakfast   cyanocobalamin  1,000 mcg Intramuscular Daily   dextromethorphan-guaiFENesin  1 tablet Oral BID   furosemide  20 mg Intravenous Daily   heparin  5,000 Units Subcutaneous Q8H   leptospermum manuka honey  1 Application Topical Daily   multivitamin with minerals  1 tablet Oral Daily   pantoprazole  40 mg Oral Daily   sodium chloride flush  3 mL Intravenous Q12H   tamsulosin  0.4 mg Oral QPC supper   Continuous Infusions:  sodium  chloride      Nutritional status     Body mass index is 28.35 kg/m.  Data Reviewed:   CBC: Recent Labs  Lab 08/29/22 0031 08/31/22 1257 09/02/22 0039  WBC 8.1 7.3 6.5  HGB 8.7* 8.9* 9.1*  HCT 26.4* 28.1* 28.6*  MCV 101.9* 103.3* 104.0*  PLT 241 277 274    Basic Metabolic Panel: Recent Labs  Lab 08/29/22 0031 09/02/22 0039  NA 134* 133*  K 4.0 3.8  CL 99 98  CO2 25 25  GLUCOSE 119* 104*  BUN 21 22  CREATININE 0.99 1.02  CALCIUM 8.6* 8.3*    GFR: Estimated Creatinine Clearance: 47.4 mL/min (by C-G formula based on SCr of 1.02 mg/dL). Liver Function Tests: No results for input(s): "AST", "ALT", "ALKPHOS", "BILITOT", "PROT", "ALBUMIN" in the last 168 hours.  No results for input(s): "LIPASE", "AMYLASE" in the last 168 hours. No results for input(s): "AMMONIA" in the last 168 hours. Coagulation Profile: No results for input(s): "INR", "PROTIME" in the last 168 hours. Cardiac Enzymes: No results for input(s): "CKTOTAL", "CKMB", "CKMBINDEX", "TROPONINI" in the  last 168 hours.  BNP (last 3 results) No results for input(s): "PROBNP" in the last 8760 hours. HbA1C: No results for input(s): "HGBA1C" in the last 72 hours. CBG: No results for input(s): "GLUCAP" in the last 168 hours. Lipid Profile: No results for input(s): "CHOL", "HDL", "LDLCALC", "TRIG", "CHOLHDL", "LDLDIRECT" in the last 72 hours. Thyroid Function Tests: No results for input(s): "TSH", "T4TOTAL", "FREET4", "T3FREE", "THYROIDAB" in the last 72 hours. Anemia Panel: No results for input(s): "VITAMINB12", "FOLATE", "FERRITIN", "TIBC", "IRON", "RETICCTPCT" in the last 72 hours.  Sepsis Labs: Recent Labs  Lab 08/29/22 0031  PROCALCITON 2.71     Recent Results (from the past 240 hour(s))  Culture, blood (Routine X 2) w Reflex to ID Panel     Status: None   Collection Time: 08/26/22 11:50 AM   Specimen: BLOOD RIGHT HAND  Result Value Ref Range Status   Specimen Description BLOOD RIGHT HAND   Final   Special Requests   Final    BOTTLES DRAWN AEROBIC AND ANAEROBIC Blood Culture results may not be optimal due to an excessive volume of blood received in culture bottles   Culture   Final    NO GROWTH 5 DAYS Performed at [facility], [address] [place], [place] [address]    Report Status 08/31/2022 FINAL  Final  Culture, blood (Routine X 2) w Reflex to ID Panel     Status: None   Collection Time: 08/26/22 11:50 AM   Specimen: Right Antecubital; Blood  Result Value Ref Range Status   Specimen Description RIGHT ANTECUBITAL  Final   Special Requests   Final    BOTTLES DRAWN AEROBIC AND ANAEROBIC Blood Culture adequate volume   Culture   Final    NO GROWTH 5 DAYS Performed at [facility], [address], [place], [place] [address]    Report Status 08/31/2022 FINAL  Final  Surgical PCR screen     Status: None   Collection Time: 09/02/22  1:21 AM   Specimen: Nasal Mucosa; Nasal Swab  Result Value Ref Range Status   MRSA, PCR NEGATIVE NEGATIVE Final   Staphylococcus aureus NEGATIVE NEGATIVE Final    Comment: (NOTE) The Xpert SA Assay (FDA approved for NASAL specimens in patients 13 years of age and older), is one component of a comprehensive surveillance program. It is not intended to diagnose infection nor to guide or monitor treatment. Performed at [facility], [address] [place], [place] [address]          Radiology Studies: No results found.         LOS: 10 days   Time spent= 35 mins    Barnetta Chapel, MD [facility]  If 7PM-7AM, please contact night-coverage  09/04/2022, 10:46 AM

## 2022-09-04 NOTE — Progress Notes (Signed)
Physical Therapy Treatment Patient Details Name: Jesse JACOX Sr. MRN: [id number] DOB: 12/14/1928 Today's Date: 09/04/2022   History of Present Illness 89 y.o. male [facility]/2022 due to generalized progressive weakness. 5/8 found to be febrile with leukocytosis, started on antibiotics for sepsis with unclear source. 5/9 Transferred to [facility] hospital found to have gram-positive cocci bacteremia with possible R foot osteomyelitis. Patient underwent balloon angioplasty of the posterior tibial artery by the vascular surgery team 5/16. Orthopedic team plans right foot fifth ray amputation on 5/22. PMH includes hypertension, BPH, prostate cancer.    PT Comments    Pt received in supine, pleasantly agreeable to therapy session and with improved tolerance and initiation for bed mobility, seated BLE exercises and transfer training. Pt eager to get OOB and was able to stand from elevated surface x2 reps from EOB and x1 from [facility] seat with +2 modA. Pt continues to benefit from PT services to progress toward functional mobility goals, continue to recommend short term low intensity post-acute rehab <3 hours/day upon DC as pt remains deconditioned and not yet back to baseline.  Recommendations for follow up therapy are one component of a multi-disciplinary discharge planning process, led by the attending physician.  Recommendations may be updated based on patient status, additional functional criteria and insurance authorization.  Follow Up Recommendations  Can patient physically be transported by private vehicle: No    Assistance Recommended at Discharge Frequent or constant Supervision/Assistance  Patient can return home with the following Two people to help with walking and/or transfers;A lot of help with bathing/dressing/bathroom;Assistance with cooking/housework;Assist for transportation;Help with stairs or ramp for entrance   Equipment Recommendations  None recommended by PT;Other  (comment) (TBD)    Recommendations for Other Services       Precautions / Restrictions Precautions Precautions: Fall Precaution Comments: L heel, R buttocks pressure injuries; HoH Restrictions Weight Bearing Restrictions: No     Mobility  Bed Mobility Overal bed mobility: Needs Assistance Bed Mobility: Supine to Sit, Sit to Supine     Supine to sit: HOB elevated, Mod assist     General bed mobility comments: use of bed features to achieve upright on R EOB    Transfers Overall transfer level: Needs assistance Equipment used: Ambulation equipment used Transfers: Sit to/from Stand, Bed to chair/wheelchair/BSC Sit to Stand: Mod assist, +2 physical assistance, From elevated surface           General transfer comment: multimodal cues, increased time to perform sit<>stand x2 from EOB with Stedy platform; flexed posture Transfer via Lift Equipment: Stedy  Ambulation/Gait               General Gait Details: unable   Stairs             Wheelchair Mobility    Modified Rankin (Stroke Patients Only)       Balance Overall balance assessment: Needs assistance Sitting-balance support: Bilateral upper extremity supported, Feet supported Sitting balance-Leahy Scale: Fair     Standing balance support: Bilateral upper extremity supported, During functional activity, Reliant on assistive device for balance Standing balance-Leahy Scale: Poor Standing balance comment: +1-2 modA in Stedy, increased assist as he fatigues                            Cognition Arousal/Alertness: Awake/alert Behavior During Therapy: WFL for tasks assessed/performed Overall Cognitive Status: Within Functional Limits for tasks assessed  Exercises General Exercises - Lower Extremity Ankle Circles/Pumps: AROM, Both, 10 reps, Supine Long Arc Quad: AROM, 10 reps, Seated, Both Heel Slides: AROM, Both, 5 reps,  Supine Hip Flexion/Marching: AROM, Right, Left, 10 reps, Seated    General Comments General comments (skin integrity, edema, etc.): VSS per chart review; heels floated in chair and cushion under bottom and lower back for comfort      Pertinent Vitals/Pain Pain Assessment Pain Assessment: No/denies pain Pain Intervention(s): Monitored during session, Repositioned    Home Living                          Prior Function            PT Goals (current goals can now be found in the care plan section) Acute Rehab PT Goals Patient Stated Goal: return home after rehab PT Goal Formulation: With patient Time For Goal Achievement: 09/08/22 Progress towards PT goals: Progressing toward goals    Frequency    Min 3X/week      PT Plan Current plan remains appropriate    Co-evaluation              AM-PAC PT "6 Clicks" Mobility   Outcome Measure  Help needed turning from your back to your side while in a flat bed without using bedrails?: A Lot Help needed moving from lying on your back to sitting on the side of a flat bed without using bedrails?: A Lot Help needed moving to and from a bed to a chair (including a wheelchair)?: Total Help needed standing up from a chair using your arms (e.g., wheelchair or bedside chair)?: A Lot Help needed to walk in hospital room?: Total Help needed climbing 3-5 steps with a railing? : Total 6 Click Score: 9    End of Session Equipment Utilized During Treatment: Gait belt Activity Tolerance: Patient tolerated treatment well Patient left: in chair;with call bell/phone within reach;with chair alarm set Nurse Communication: Mobility status;Need for lift equipment (Stedy and +2) PT Visit Diagnosis: Unsteadiness on feet (R26.81);Other abnormalities of gait and mobility (R26.89);Muscle weakness (generalized) (M62.81)     Time: 1610-9604 PT Time Calculation (min) (ACUTE ONLY): 27 min  Charges:  $Therapeutic Exercise: 8-22  mins $Therapeutic Activity: 8-22 mins                     Kanyon Bunn P., PTA Acute Rehabilitation Services Secure Chat Preferred 9a-5:30pm Office: [phone]    Dorathy Kinsman [facility] 09/04/2022, 6:44 PM

## 2022-09-05 DIAGNOSIS — A499 Bacterial infection, unspecified: Secondary | ICD-10-CM | POA: Diagnosis not present

## 2022-09-05 NOTE — Progress Notes (Signed)
PROGRESS NOTE    Jesse SLADKY Sr.  WJX:[id number] DOB: Sep 02, 1928 DOA: 08/24/2022 PCP: Elfredia Nevins, MD   Brief Narrative:  89 y.o. male with a history of hypertension, BPH, prostate cancer. Patient presented secondary to generalized weakness with evidence of sepsis on admission. Patient was started on empiric antibiotics for found to have gram-positive cocci bacteremia. Patient also noted to have a right foot ulcer concerning for possible osteomyelitis.  ID, Dr. Lajoyce Corners and vascular were consulted.  Currently on IV antibiotics.  ABIs were abnormal therefore angiogram performed by vascular 5/16 with successful recannulization/angioplasty. Dr Lajoyce Corners notified so we can proceed with further plans  09/04/2022: Patient seen.  No new complaints.  Orthopedic team plans right foot fifth ray amputation on Wednesday.  Patient underwent balloon angioplasty of the posterior tibial artery by the vascular surgery team. 09/05/2022: No new changes.  For surgery on Wednesday.   Assessment & Plan:  Principal Problem:   Polymicrobial bacterial infection Active Problems:   Fever of undetermined origin   Lactic acidosis   Generalized weakness   Bronchitis   Bilateral pleural effusion   Right foot ulcer (HCC)   Essential hypertension   BPH (benign prostatic hyperplasia)   Paroxysmal atrial fibrillation (HCC)   Osteomyelitis of fifth toe of right foot (HCC)   Bacteremia   Acute cystitis without hematuria   PVD (peripheral vascular disease) (HCC)     Sepsis secondary to gram-positive bacteremia Aerococcus/Actinotignum schaalii bacteremia Antibiotic management per infectious disease at this time.  He should be completing his 7-day bacteremia treatment secondary to UTI today but if we need to cover him for his toe.  Currently off antibiotic in anticipation for possible surgical cultures during amputation. 09/05/2022: Sepsis physiology has resolved.  Infectious disease input is highly appreciated.   Right foot  ulcer Right foot osteomyelitis fifth metatarsal head Lack of poor circulation noted on the ABI.  Status post angiogram/recannulization.  Will defer further management to orthopedic; Dr Lajoyce Corners notified.  09/05/2022: For surgery on Wednesday.  See above documentation.  Anemia Last hemoglobin was 9.1 g/dL.     Peripheral neuropathy History of vitamin B12 deficiency, supplements continue   Progressive weakness Deconditioning but could also be related to ongoing bacteremia. PT/OT recommending SNF on discharge.   Prostate cancer Patient follows with urology. During this admission, sclerotic focus noted on left second rib and is nonspecific. May need to discuss with IR prior to discharge.   Atrial fibrillation with slow ventricular response Stable currently. Transthoracic Echocardiogram ordered and is significant for mildly dilated left atrium with normal LVEF.  Previous provider discussed anticoagulation with patient's family, currently on hold.   Pressure injury Left heel, right buttocks.  Both present on admission.     DVT prophylaxis: Lovenox Code Status:   Code Status: Full Code Family Communication: Family at bedside Disposition Plan: Further inpatient plans per orthopedic     Pressure Injury 08/25/22 Heel Left Deep Tissue Pressure Injury - Purple or maroon localized area of discolored intact skin or blood-filled blister due to damage of underlying soft tissue from pressure and/or shear. 6.5cm x 5.5cm (Active)  08/25/22 0730  Location: Heel  Location Orientation: Left  Staging: Deep Tissue Pressure Injury - Purple or maroon localized area of discolored intact skin or blood-filled blister due to damage of underlying soft tissue from pressure and/or shear.  Wound Description (Comments): 6.5cm x 5.5cm  Present on Admission: Yes     Pressure Injury 08/25/22 Buttocks Right Stage 2 -  Partial thickness loss  of dermis presenting as a shallow open injury with a red, pink wound bed without  slough. (Active)  08/25/22   Location: Buttocks  Location Orientation: Right  Staging: Stage 2 -  Partial thickness loss of dermis presenting as a shallow open injury with a red, pink wound bed without slough.  Wound Description (Comments):   Present on Admission: Yes     Diet Orders (From admission, onward)     Start     Ordered   09/02/22 1607  Diet Heart Room service appropriate? Yes with Assist; Fluid consistency: Thin  Diet effective now       Question Answer Comment  Room service appropriate? Yes with Assist   Fluid consistency: Thin      09/02/22 1606            Subjective: No new complaints. No fever or chills.  Examination: Constitutional: Patient is obese.  Not in acute distress Respiratory: Clear to auscultation  Cardiovascular: S1-S2.   Abdomen: Obese, soft and nontender.   Musculoskeletal: Fullness of the ankle versus edema. Neurologic: Awake and alert. Objective: Vitals:   09/04/22 2359 09/05/22 0338 09/05/22 0754 09/05/22 1220  BP: (!) 126/55 (!) 125/48 (!) 126/54 (!) 148/55  Pulse: (!) 58 60 61 (!) 59  Resp: 20 20    Temp: 98 F (36.7 C) 98 F (36.7 C) 98 F (36.7 C) 98.1 F (36.7 C)  TempSrc: Oral Oral Oral Oral  SpO2: 100% 100% 98% 100%  Weight:      Height:        Intake/Output Summary (Last 24 hours) at 09/05/2022 1327 Last data filed at 09/05/2022 1310 Gross per 24 hour  Intake 240 ml  Output 775 ml  Net -535 ml    Filed Weights   08/24/22 1414  Weight: 82.1 kg    Scheduled Meds:  aspirin EC  81 mg Oral Daily   atorvastatin  40 mg Oral Daily   clopidogrel  75 mg Oral Q breakfast   cyanocobalamin  1,000 mcg Intramuscular Daily   dextromethorphan-guaiFENesin  1 tablet Oral BID   furosemide  20 mg Intravenous Daily   heparin  5,000 Units Subcutaneous Q8H   leptospermum manuka honey  1 Application Topical Daily   multivitamin with minerals  1 tablet Oral Daily   pantoprazole  40 mg Oral Daily   sodium chloride flush  3 mL  Intravenous Q12H   tamsulosin  0.4 mg Oral QPC supper   Continuous Infusions:  sodium chloride      Nutritional status     Body mass index is 28.35 kg/m.  Data Reviewed:   CBC: Recent Labs  Lab 08/31/22 1257 09/02/22 0039  WBC 7.3 6.5  HGB 8.9* 9.1*  HCT 28.1* 28.6*  MCV 103.3* 104.0*  PLT 277 274    Basic Metabolic Panel: Recent Labs  Lab 09/02/22 0039  NA 133*  K 3.8  CL 98  CO2 25  GLUCOSE 104*  BUN 22  CREATININE 1.02  CALCIUM 8.3*    GFR: Estimated Creatinine Clearance: 47.4 mL/min (by C-G formula based on SCr of 1.02 mg/dL). Liver Function Tests: No results for input(s): "AST", "ALT", "ALKPHOS", "BILITOT", "PROT", "ALBUMIN" in the last 168 hours.  No results for input(s): "LIPASE", "AMYLASE" in the last 168 hours. No results for input(s): "AMMONIA" in the last 168 hours. Coagulation Profile: No results for input(s): "INR", "PROTIME" in the last 168 hours. Cardiac Enzymes: No results for input(s): "CKTOTAL", "CKMB", "CKMBINDEX", "TROPONINI" in the last 168  hours.  BNP (last 3 results) No results for input(s): "PROBNP" in the last 8760 hours. HbA1C: No results for input(s): "HGBA1C" in the last 72 hours. CBG: No results for input(s): "GLUCAP" in the last 168 hours. Lipid Profile: No results for input(s): "CHOL", "HDL", "LDLCALC", "TRIG", "CHOLHDL", "LDLDIRECT" in the last 72 hours. Thyroid Function Tests: No results for input(s): "TSH", "T4TOTAL", "FREET4", "T3FREE", "THYROIDAB" in the last 72 hours. Anemia Panel: No results for input(s): "VITAMINB12", "FOLATE", "FERRITIN", "TIBC", "IRON", "RETICCTPCT" in the last 72 hours.  Sepsis Labs: No results for input(s): "PROCALCITON", "LATICACIDVEN" in the last 168 hours.   Recent Results (from the past 240 hour(s))  Surgical PCR screen     Status: None   Collection Time: 09/02/22  1:21 AM   Specimen: Nasal Mucosa; Nasal Swab  Result Value Ref Range Status   MRSA, PCR NEGATIVE NEGATIVE Final    Staphylococcus aureus NEGATIVE NEGATIVE Final    Comment: (NOTE) The Xpert SA Assay (FDA approved for NASAL specimens in patients 89 years of age and older), is one component of a comprehensive surveillance program. It is not intended to diagnose infection nor to guide or monitor treatment. Performed at [facility], [address] [place], [place] [address]          Radiology Studies: No results found.         LOS: 11 days   Time spent= 35 mins    Barnetta Chapel, MD [facility]  If 7PM-7AM, please contact night-coverage  09/05/2022, 1:27 PM

## 2022-09-06 ENCOUNTER — Telehealth (HOSPITAL_COMMUNITY): Payer: Self-pay

## 2022-09-06 ENCOUNTER — Other Ambulatory Visit (HOSPITAL_COMMUNITY): Payer: Self-pay

## 2022-09-06 DIAGNOSIS — A499 Bacterial infection, unspecified: Secondary | ICD-10-CM | POA: Diagnosis not present

## 2022-09-06 LAB — CBC WITH DIFFERENTIAL/PLATELET
Abs Immature Granulocytes: 0.03 10*3/uL (ref 0.00–0.07)
Basophils Absolute: 0.1 10*3/uL (ref 0.0–0.1)
Basophils Relative: 1 %
Eosinophils Absolute: 0.3 10*3/uL (ref 0.0–0.5)
Eosinophils Relative: 3 %
HCT: 25.2 % — ABNORMAL LOW (ref 39.0–52.0)
Hemoglobin: 8.1 g/dL — ABNORMAL LOW (ref 13.0–17.0)
Immature Granulocytes: 0 %
Lymphocytes Relative: 38 %
Lymphs Abs: 2.8 10*3/uL (ref 0.7–4.0)
MCH: 33.6 pg (ref 26.0–34.0)
MCHC: 32.1 g/dL (ref 30.0–36.0)
MCV: 104.6 fL — ABNORMAL HIGH (ref 80.0–100.0)
Monocytes Absolute: 0.6 10*3/uL (ref 0.1–1.0)
Monocytes Relative: 8 %
Neutro Abs: 3.7 10*3/uL (ref 1.7–7.7)
Neutrophils Relative %: 50 %
Platelets: 284 10*3/uL (ref 150–400)
RBC: 2.41 MIL/uL — ABNORMAL LOW (ref 4.22–5.81)
RDW: 15 % (ref 11.5–15.5)
WBC: 7.4 10*3/uL (ref 4.0–10.5)
nRBC: 0 % (ref 0.0–0.2)

## 2022-09-06 LAB — RENAL FUNCTION PANEL
Albumin: 2.5 g/dL — ABNORMAL LOW (ref 3.5–5.0)
Anion gap: 7 (ref 5–15)
BUN: 22 mg/dL (ref 8–23)
CO2: 26 mmol/L (ref 22–32)
Calcium: 8 mg/dL — ABNORMAL LOW (ref 8.9–10.3)
Chloride: 99 mmol/L (ref 98–111)
Creatinine, Ser: 1.08 mg/dL (ref 0.61–1.24)
GFR, Estimated: >60 mL/min (ref >60)
Glucose, Bld: 106 mg/dL — ABNORMAL HIGH (ref 70–99)
Phosphorus: 3.7 mg/dL (ref 2.5–4.6)
Potassium: 3.7 mmol/L (ref 3.5–5.1)
Sodium: 132 mmol/L — ABNORMAL LOW (ref 135–145)

## 2022-09-06 LAB — MAGNESIUM: Magnesium: 2.1 mg/dL (ref 1.7–2.4)

## 2022-09-06 MED ORDER — HEPARIN (PORCINE) 25000 UT/250ML-% IV SOLN
1000.0000 [IU]/h | INTRAVENOUS | Status: DC
  Administered 2022-09-06 – 2022-09-07 (×2): 1000 [IU]/h via INTRAVENOUS
  Filled 2022-09-06 (×2): qty 250

## 2022-09-06 MED ORDER — OMEGA-3-ACID ETHYL ESTERS 1 G PO CAPS
1.0000 g | ORAL_CAPSULE | Freq: Every day | ORAL | Status: DC
  Administered 2022-09-06 – 2022-09-10 (×4): 1 g via ORAL
  Filled 2022-09-06 (×4): qty 1

## 2022-09-06 MED ORDER — OMEGA-3 FATTY ACIDS 1000 MG PO CAPS: 1.0000 g | ORAL_CAPSULE | Freq: Every day | ORAL | Status: DC

## 2022-09-06 MED ORDER — SENNOSIDES-DOCUSATE SODIUM 8.6-50 MG PO TABS
1.0000 | ORAL_TABLET | Freq: Two times a day (BID) | ORAL | Status: DC
  Administered 2022-09-06 – 2022-09-10 (×7): 1 via ORAL
  Filled 2022-09-06 (×7): qty 1

## 2022-09-06 MED ORDER — POLYETHYLENE GLYCOL 3350 17 G PO PACK
17.0000 g | PACK | Freq: Every day | ORAL | Status: DC | PRN
  Administered 2022-09-06: 17 g via ORAL
  Filled 2022-09-06: qty 1

## 2022-09-06 NOTE — Progress Notes (Signed)
[facility]  Date of Admission:  08/24/2022           Reason for visit: Follow up on Bacteremia  Current antibiotics: None  ASSESSMENT:    89 y.o. male admitted with:  Bacteremia: due to Aerococcus urinae and Actinotignum schaalii.  Treated and possibly from bladder source since isolated in urine as well.  Right foot ulcer with underlying osteomyelitis: MRI noted findings of osteomyelitis 08/30/22.  Status post angiography with successful revascularization to right leg by VVS 09/02/22.  Planning for OR for right 5th ray amputation Wednesday 09/08/22. Hx of remote PCN allergy: tolerated amoxicillin challenge and also tolerated Unasyn.  RECOMMENDATIONS:    Continue holding antibiotics Will follow up surgical cultures from amputation this week Following   Principal Problem:   Polymicrobial bacterial infection Active Problems:   Fever of undetermined origin   Lactic acidosis   Generalized weakness   Bronchitis   Bilateral pleural effusion   Right foot ulcer (HCC)   Essential hypertension   BPH (benign prostatic hyperplasia)   Paroxysmal atrial fibrillation (HCC)   Osteomyelitis of fifth toe of right foot (HCC)   Bacteremia   Acute cystitis without hematuria   PVD (peripheral vascular disease) (HCC)    MEDICATIONS:    Scheduled Meds:  aspirin EC  81 mg Oral Daily   atorvastatin  40 mg Oral Daily   clopidogrel  75 mg Oral Q breakfast   cyanocobalamin  1,000 mcg Intramuscular Daily   dextromethorphan-guaiFENesin  1 tablet Oral BID   furosemide  20 mg Intravenous Daily   heparin  5,000 Units Subcutaneous Q8H   leptospermum manuka honey  1 Application Topical Daily   multivitamin with minerals  1 tablet Oral Daily   pantoprazole  40 mg Oral Daily   sodium chloride flush  3 mL Intravenous Q12H   tamsulosin  0.4 mg Oral QPC supper   Continuous Infusions:  sodium chloride     PRN Meds:.sodium chloride, acetaminophen, diphenhydrAMINE,  EPINEPHrine, hydrALAZINE, ipratropium-albuterol, labetalol, ondansetron (ZOFRAN) IV, ondansetron **OR** [DISCONTINUED] ondansetron (ZOFRAN) IV, sodium chloride flush  SUBJECTIVE:   No acute events and no new complaints.  Review of Systems  All other systems reviewed and are negative.     OBJECTIVE:   Blood pressure (!) 140/67, pulse (!) 56, temperature 98.2 F (36.8 C), temperature source Oral, resp. rate 20, height 5\' 7"  (1.702 m), weight 82.1 kg, SpO2 100 %. Body mass index is 28.35 kg/m.  Physical Exam Constitutional:      Appearance: Normal appearance.  Pulmonary:     Effort: Pulmonary effort is normal. No respiratory distress.  Abdominal:     General: There is no distension.     Palpations: Abdomen is soft.  Skin:    General: Skin is warm and dry.  Neurological:     General: No focal deficit present.     Mental Status: He is alert. Mental status is at baseline.  Psychiatric:        Mood and Affect: Mood normal.        Behavior: Behavior normal.      Lab Results: Lab Results  Component Value Date   WBC 7.4 09/06/2022   HGB 8.1 (L) 09/06/2022   HCT 25.2 (L) 09/06/2022   MCV 104.6 (H) 09/06/2022   PLT 284 09/06/2022    Lab Results  Component Value Date   NA 132 (L) 09/06/2022   K 3.7 09/06/2022   CO2 26 09/06/2022  GLUCOSE 106 (H) 09/06/2022   BUN 22 09/06/2022   CREATININE 1.08 09/06/2022   CALCIUM 8.0 (L) 09/06/2022   GFRNONAA >60 09/06/2022   GFRAA 55 (L) 07/24/2019    Lab Results  Component Value Date   ALT 18 08/27/2022   AST 33 08/27/2022   ALKPHOS 46 08/27/2022   BILITOT 0.8 08/27/2022       Component Value Date/Time   CRP 9.1 (H) 08/27/2022 0319    No results found for: "ESRSEDRATE"   I have reviewed the micro and lab results in Epic.  Imaging: No results found.   Imaging independently reviewed in Epic.    Vedia Coffer for Infectious Disease [facility] [phone] pager 09/06/2022,  11:28 AM  I have personally spent 35 minutes involved in face-to-face and non-face-to-face activities for this patient on the day of the visit. Professional time spent includes the following activities: Preparing to see the patient (review of tests), Obtaining and/or reviewing separately obtained history (admission/discharge record), Performing a medically appropriate examination and/or evaluation , Ordering medications/tests/procedures, referring and communicating with other health care professionals, Documenting clinical information in the EMR, Independently interpreting results (not separately reported), Communicating results to the patient/family/caregiver, Counseling and educating the patient/family/caregiver and Care coordination (not separately reported).

## 2022-09-06 NOTE — Consult Note (Signed)
CARDIOLOGY CONSULT NOTE  Patient ID: Jesse Pollock Sr. MRN: [id number] DOB/AGE: 1929-04-05 89 y.o.  Admit date: 08/24/2022 Referring Physician: [facility] hospitalist Reason for Consultation:  Afib  HPI:   89 y.o. African-American male with hypertension, prostate cancer, BPH, admitted with PAD with CLI-necrotic ulcer fifth metatarsal head right foot, with underlying osteomyelitis..  Cardiology consulted for atrial fibrillation.  Past Medical History:  Diagnosis Date   Hypertension    Kidney calculus 2014;2013   Pneumonia    Prostate cancer [facility])    Shortness of breath      Past Surgical History:  Procedure Laterality Date   CATARACT EXTRACTION W/PHACO Right 02/13/2013   Procedure: CATARACT EXTRACTION RIGHT EYE (WITH PHACO) AND INTRAOCULAR LENS PLACEMENT  CDE=14.53;  Surgeon: Loraine Leriche T. Nile Riggs, MD;  Location: [facility];  Service: Ophthalmology;  Laterality: Right;   CATARACT EXTRACTION W/PHACO Left 02/27/2013   Procedure: CATARACT EXTRACTION PHACO AND INTRAOCULAR LENS PLACEMENT (IOC);  Surgeon: Loraine Leriche T. Nile Riggs, MD;  Location: [facility];  Service: Ophthalmology;  Laterality: Left;  CDE:12.54   COLONOSCOPY     JOINT REPLACEMENT Right 1993;1980   Right x2     History reviewed. No pertinent family history.   Social History: Social History   Socioeconomic History   Marital status: Widowed    Spouse name: Not on file   Number of children: Not on file   Years of education: Not on file   Highest education level: Not on file  Occupational History   Not on file  Tobacco Use   Smoking status: Never   Smokeless tobacco: Never  Vaping Use   Vaping Use: Never used  Substance and Sexual Activity   Alcohol use: No   Drug use: No   Sexual activity: Not on file  Other Topics Concern   Not on file  Social History Narrative   Not on file   Social Determinants of Health   Financial Resource Strain: Low Risk  (07/24/2019)   Overall Financial Resource Strain (CARDIA)    Difficulty of Paying  Living Expenses: Not hard at all  Food Insecurity: No Food Insecurity (08/25/2022)   Hunger Vital Sign    Worried About Running Out of Food in the Last Year: Never true    Ran Out of Food in the Last Year: Never true  Transportation Needs: No Transportation Needs (08/25/2022)   PRAPARE - Administrator, Civil Service (Medical): No    Lack of Transportation (Non-Medical): No  Physical Activity: Inactive (07/24/2019)   Exercise Vital Sign    Days of Exercise per Week: 0 days    Minutes of Exercise per Session: 0 min  Stress: No Stress Concern Present (07/24/2019)   [employer] of Occupational Health - Occupational Stress Questionnaire    Feeling of Stress : Not at all  Social Connections: Moderately Integrated (07/24/2019)   Social Connection and Isolation Panel [NHANES]    Frequency of Communication with Friends and Family: More than three times a week    Frequency of Social Gatherings with Friends and Family: More than three times a week    Attends Religious Services: More than 4 times per year    Active Member of [employer] or Organizations: Yes    Attends Banker Meetings: 1 to 4 times per year    Marital Status: Widowed  Intimate Partner Violence: Not At Risk (08/25/2022)   Humiliation, Afraid, Rape, and Kick questionnaire    Fear of Current or Ex-Partner: No    Emotionally  Abused: No    Physically Abused: No    Sexually Abused: No     Medications Prior to Admission  Medication Sig Dispense Refill Last Dose   alendronate (FOSAMAX) 70 MG tablet TAKE 1 TABLET EVERY WEEK IN THE MORNING 30 MIN BEFORE EATING WITH AN 8OZ GLASS OF WATER (SIT UP 30 MIN) 4 tablet 11 Past Week   aspirin EC 81 MG tablet Take 81 mg by mouth daily.   08/24/2022   benazepril (LOTENSIN) 10 MG tablet Take 10 mg by mouth daily.   08/24/2022   fish oil-omega-3 fatty acids 1000 MG capsule Take 1 g by mouth daily.   08/24/2022   hydrochlorothiazide (HYDRODIURIL) 25 MG tablet Take 25 mg by mouth daily.    08/24/2022   tamsulosin (FLOMAX) 0.4 MG CAPS capsule Take 0.4 mg by mouth daily after supper.   Past Week   furosemide (LASIX) 20 MG tablet Take 20-40 mg by mouth daily as needed for fluid.  (Patient not taking: Reported on 08/25/2022)   Not Taking    Review of Systems  Cardiovascular:  Negative for chest pain, dyspnea on exertion, leg swelling, palpitations and syncope.      Physical Exam: Physical Exam Vitals and nursing note reviewed.  Constitutional:      General: He is not in acute distress. Neck:     Vascular: No JVD.  Cardiovascular:     Rate and Rhythm: Normal rate. Rhythm irregular.     Pulses:          Dorsalis pedis pulses are 0 on the right side and 0 on the left side.       Posterior tibial pulses are 0 on the right side and 0 on the left side.     Heart sounds: Normal heart sounds. No murmur heard. Pulmonary:     Effort: Pulmonary effort is normal.     Breath sounds: Normal breath sounds. No wheezing or rales.  Musculoskeletal:     Right lower leg: No edema.     Left lower leg: No edema.        Imaging/tests reviewed and independently interpreted: Lab Results: CBC, BMP  Cardiac Studies:  Telemetry 09/06/2022: Rate controlled Afib  EKG 08/19/2022: A-fib with controlled ventricular rate Nonspecific T wave abnormality  Echocardiogram 08/26/2022:  1. Left ventricular ejection fraction, by estimation, is 65 to 70%. The  left ventricle has normal function. The left ventricle has no regional  wall motion abnormalities. There is mild left ventricular hypertrophy.  Left ventricular diastolic parameters  are indeterminate.   2. Right ventricular systolic function is normal. The right ventricular  size is normal. There is normal pulmonary artery systolic pressure. The  estimated right ventricular systolic pressure is 14.2 mmHg.   3. Left atrial size was mildly dilated.   4. The mitral valve is degenerative. Mild mitral valve regurgitation.   5. The aortic valve is  tricuspid. Aortic valve regurgitation is trivial.   6. The inferior vena cava is normal in size with <50% respiratory  variability, suggesting right atrial pressure of 8 mmHg.   Comparison(s): No prior Echocardiogram.    Assessment & Recommendations:  89 y.o. African-American male with hypertension, prostate cancer, BPH, admitted with PAD with CLI-necrotic ulcer fifth metatarsal head right foot, with underlying osteomyelitis..  Cardiology consulted for atrial fibrillation.  Persistent Afib: New diagnosis. Rate controlled. CHA2DS2VASc score 5, annual stroke risk 7.2%. He is currently not on anticoagulation. He is on DAPT in the setting of CLI. Recommend anticoagulation  for stroke prevention. Pending vascular workup with or without amputation, we could use IV heparin. When okay to use oral anticoagulation, we could use DOAC Eliquis 5 mg bid with either one of Aspirin or Plavix per vascular surgery preference. I would certainly avoid triple therapy to reduce bleeding risk.  Discussed interpretation of tests and management recommendations with the primary team     Elder Negus, MD Pager: [phone] Office: [phone]

## 2022-09-06 NOTE — Telephone Encounter (Signed)
Pharmacy Patient Advocate Encounter  Insurance verification completed.    The patient is insured through [employer]   The patient is currently admitted and ran test claims for the following: Eliquis, Xarelto.  Copays and coinsurance results were relayed to Inpatient clinical team.

## 2022-09-06 NOTE — Progress Notes (Signed)
PROGRESS NOTE [name]  NWG:[id number] DOB: November 10, 1928 DOA: 08/24/2022 PCP: Elfredia Nevins, MD  Brief Narrative/Hospital Course: Jesse Pollock Sr. is a 89 y.o. male with a history of hypertension, BPH, prostate cancer presented secondary to generalized weakness, difficulty ambulation a few weeks on 08/25/22  Seen in the ED, found to have anemia, lactic acidosis underwent CT angio chest, CT abdomen pelvis, x-ray left foot right foot chest x-ray> and admitted for sepsis/fever, lactic acidosis with right foot ulcer, bronchitis Workup revealed gram-positive bacteremia, Aerococcus//Actinotignum schaalii bacteremia right foot osteomyelitis of fifth metatarsal head.  ABI noted poor circulation/critical ischemia with tissue loss at the toes- underwent angiogram  5/16:with recanalization/balloon angioplasty of the tibioperoneal trunk and resolution of flow-limiting stenosis within the posterior tibial artery by vascular Dr Zella Ball. Dr. Lajoyce Corners has been consulted> planning for right foot fifth ray amputation. Seen by infectious disease, completed antibiotics and monitoring off antibiotic    Subjective: Seen and examined No new complaints no chest pain Overnight heart rate in 50s, BP stable, on room air Labs with mild sensory bacteremia 132 stable renal function hemoglobin at 8.1 g previously 8.7- 9  Assessment and Plan: Principal Problem:   Polymicrobial bacterial infection Active Problems:   Fever of undetermined origin   Lactic acidosis   Generalized weakness   Bronchitis   Bilateral pleural effusion   Right foot ulcer (HCC)   Essential hypertension   BPH (benign prostatic hyperplasia)   Paroxysmal atrial fibrillation (HCC)   Osteomyelitis of fifth toe of right foot (HCC)   Bacteremia   Acute cystitis without hematuria   PVD (peripheral vascular disease) (HCC)   Sepsis POA w/ Gram-positive bacteremia/Aerococcus//Actinotignum schaalii bacteremia Completed antibiotics ID following monitoring  off antibiotics.  Seen by vascular and podiatry see below.  Awaiting further ray amputation  Right foot osteomyelitis of fifth metatarsal head: Critical critical ischemia with tissue loss at the toes: recanalization/balloon angioplasty of the tibioperoneal trunk and resolution of flow-limiting stenosis within the posterior tibial artery by vascular Dr Zella Ball.Dr. Lajoyce Corners has been consulted> planning for right foot fifth ray amputation. ID requesting culture from surgical site.Continue pain control, continue aspirin, Plavix Lipitor.  Peripheral neuropathy continue B12 supplement Debility deconditioning weakness continue PT OT, recommending skilled nursing facility upon discharge Prostate cancer followed by urology continue Flomax sclerotic focus noted on left second rib and nonspecific-May need to discuss with IR prior to discharge.   A-fib with slow ventricular response rate:Echo with normal EF.  Patient not on anticoagulation currently, no known A Fib hx- chads2vasc score high of 5- I will consult cardiology.  Hyponatremia:Mild,monitor.  Anemia of chronic disease and b12 deficiency: anemia panel w. saturation 60 normal I folate, B12 was previously low on 5/7 < 50 and on B12 supplement 100 mcg I'm daily. Recent Labs  Lab 08/31/22 1257 09/02/22 0039 09/06/22 0218  HGB 8.9* 9.1* 8.1*  HCT 28.1* 28.6* 25.2*    Pressure injury of the left  heel, right buttock POA  DVT prophylaxis: heparin injection 5,000 Units Start: 09/02/22 2200 SCDs Start: 08/25/22 0026 Code Status:   Code Status: Full Code Family Communication: plan of care discussed with patient at bedside. Patient status is:  inpatient  because of critical limb ischemia Level of care: Progressive Cardiac   Dispo: The patient is from: home, lives alone has family nearby, and daughter nwo lives with him            Anticipated disposition: TBD in Objective: Vitals last 24 hrs: Vitals:   09/05/22  2033 09/05/22 2329 09/06/22 0326  09/06/22 0725  BP: (!) 138/58 (!) 132/50 (!) 141/58 (!) 140/67  Pulse: 63 (!) 58 (!) 59 (!) 56  Resp: 20 20 20 20   Temp: 98.2 F (36.8 C) 98.7 F (37.1 C) 98.5 F (36.9 C) 98.2 F (36.8 C)  TempSrc: Oral Oral Oral Oral  SpO2: 100% 100% 100% 100%  Weight:      Height:       Weight change:   Physical Examination: General exam: alert awake, pleasant,older than stated age HEENT:Oral mucosa moist, Ear/Nose WNL grossly Respiratory system: bilaterally clear BS, no use of accessory muscle Cardiovascular system: S1 & S2 +, No JVD. Gastrointestinal system: Abdomen soft,NT,ND, BS+ Nervous System:Alert, awake, moving extremities. Extremities: LE edema neg,distal peripheral pulses palpable.  Skin: No rashes,no icterus. MSK: Normal muscle bulk,tone, power  Medications reviewed:  Scheduled Meds:  aspirin EC  81 mg Oral Daily   atorvastatin  40 mg Oral Daily   clopidogrel  75 mg Oral Q breakfast   cyanocobalamin  1,000 mcg Intramuscular Daily   dextromethorphan-guaiFENesin  1 tablet Oral BID   furosemide  20 mg Intravenous Daily   heparin  5,000 Units Subcutaneous Q8H   leptospermum manuka honey  1 Application Topical Daily   multivitamin with minerals  1 tablet Oral Daily   pantoprazole  40 mg Oral Daily   sodium chloride flush  3 mL Intravenous Q12H   tamsulosin  0.4 mg Oral QPC supper   Continuous Infusions:  sodium chloride     Pressure Injury 08/25/22 Heel Left Deep Tissue Pressure Injury - Purple or maroon localized area of discolored intact skin or blood-filled blister due to damage of underlying soft tissue from pressure and/or shear. 6.5cm x 5.5cm (Active)  08/25/22 0730  Location: Heel  Location Orientation: Left  Staging: Deep Tissue Pressure Injury - Purple or maroon localized area of discolored intact skin or blood-filled blister due to damage of underlying soft tissue from pressure and/or shear.  Wound Description (Comments): 6.5cm x 5.5cm  Present on Admission: Yes   Dressing Type Foam - Lift dressing to assess site every shift 09/05/22 2100     Pressure Injury 08/25/22 Buttocks Right Stage 2 -  Partial thickness loss of dermis presenting as a shallow open injury with a red, pink wound bed without slough. (Active)  08/25/22   Location: Buttocks  Location Orientation: Right  Staging: Stage 2 -  Partial thickness loss of dermis presenting as a shallow open injury with a red, pink wound bed without slough.  Wound Description (Comments):   Present on Admission: Yes  Dressing Type Foam - Lift dressing to assess site every shift 09/05/22 2100   Diet Order             Diet Heart Room service appropriate? Yes with Assist; Fluid consistency: Thin  Diet effective now                  Intake/Output Summary (Last 24 hours) at 09/06/2022 1011 Last data filed at 09/06/2022 0730 Gross per 24 hour  Intake 240 ml  Output 950 ml  Net -710 ml   Net IO Since Admission: -709.76 mL [09/06/22 1011]  Wt Readings from Last 3 Encounters:  08/24/22 82.1 kg  07/06/22 97.5 kg  05/20/20 97.5 kg     Unresulted Labs (From admission, onward)    None     Data Reviewed: I have personally reviewed following labs and imaging studies CBC: Recent Labs  Lab 08/31/22  1257 09/02/22 0039 09/06/22 0218  WBC 7.3 6.5 7.4  NEUTROABS  --   --  3.7  HGB 8.9* 9.1* 8.1*  HCT 28.1* 28.6* 25.2*  MCV 103.3* 104.0* 104.6*  PLT 277 274 284   Basic Metabolic Panel: Recent Labs  Lab 09/02/22 0039 09/06/22 0218  NA 133* 132*  K 3.8 3.7  CL 98 99  CO2 25 26  GLUCOSE 104* 106*  BUN 22 22  CREATININE 1.02 1.08  CALCIUM 8.3* 8.0*  MG  --  2.1  PHOS  --  3.7   Recent Labs  Lab 09/06/22 0218  ALBUMIN 2.5*   No results for input(s): "PROCALCITON", "LATICACIDVEN" in the last 168 hours.  Recent Results (from the past 240 hour(s))  Surgical PCR screen     Status: None   Collection Time: 09/02/22  1:21 AM   Specimen: Nasal Mucosa; Nasal Swab  Result Value Ref Range  Status   MRSA, PCR NEGATIVE NEGATIVE Final   Staphylococcus aureus NEGATIVE NEGATIVE Final    Comment: (NOTE) The Xpert SA Assay (FDA approved for NASAL specimens in patients 33 years of age and older), is one component of a comprehensive surveillance program. It is not intended to diagnose infection nor to guide or monitor treatment. Performed at [facility], [address], [place], [place] [address]     Antimicrobials: Anti-infectives (From admission, onward)    Start     Dose/Rate Route Frequency Ordered Stop   08/31/22 1700  Ampicillin-Sulbactam (UNASYN) 3 g in sodium chloride 0.9 % 100 mL IVPB  Status:  Discontinued        3 g 200 mL/hr over 30 Minutes Intravenous Every 6 hours 08/31/22 1028 09/01/22 0911   08/30/22 2200  cefTRIAXone (ROCEPHIN) 2 g in sodium chloride 0.9 % 100 mL IVPB  Status:  Discontinued        2 g 200 mL/hr over 30 Minutes Intravenous Every 24 hours 08/30/22 1143 08/31/22 1028   08/30/22 1615  amoxicillin (AMOXIL) capsule 500 mg        500 mg Oral  Once 08/30/22 1522 08/30/22 1730   08/30/22 1415  amoxicillin (AMOXIL) capsule 500 mg  Status:  Discontinued        500 mg Oral  Once 08/30/22 1318 08/30/22 1522   08/25/22 2200  vancomycin (VANCOCIN) IVPB 1000 mg/200 mL premix  Status:  Discontinued        1,000 mg 200 mL/hr over 60 Minutes Intravenous Every 24 hours 08/24/22 2126 08/30/22 1143   08/25/22 1800  metroNIDAZOLE (FLAGYL) tablet 500 mg  Status:  Discontinued        500 mg Oral Every 12 hours 08/25/22 1709 08/31/22 1028   08/24/22 2130  ceFEPIme (MAXIPIME) 2 g in sodium chloride 0.9 % 100 mL IVPB  Status:  Discontinued        2 g 200 mL/hr over 30 Minutes Intravenous  Once 08/24/22 2120 08/24/22 2126   08/24/22 2130  metroNIDAZOLE (FLAGYL) IVPB 500 mg        500 mg 100 mL/hr over 60 Minutes Intravenous  Once 08/24/22 2120 08/24/22 2312   08/24/22 2130  vancomycin (VANCOCIN) IVPB 1000 mg/200 mL premix  Status:  Discontinued        1,000  mg 200 mL/hr over 60 Minutes Intravenous  Once 08/24/22 2120 08/24/22 2126   08/24/22 2130  ceFEPIme (MAXIPIME) 2 g in sodium chloride 0.9 % 100 mL IVPB  Status:  Discontinued  2 g 200 mL/hr over 30 Minutes Intravenous Every 12 hours 08/24/22 2126 08/26/22 1426   08/24/22 2130  vancomycin (VANCOREADY) IVPB 1250 mg/250 mL        1,250 mg 166.7 mL/hr over 90 Minutes Intravenous  Once 08/24/22 2126 08/25/22 0029      Culture/Microbiology    Component Value Date/Time   SDES BLOOD RIGHT HAND 08/26/2022 1150   SDES RIGHT ANTECUBITAL 08/26/2022 1150   SPECREQUEST  08/26/2022 1150    BOTTLES DRAWN AEROBIC AND ANAEROBIC Blood Culture results may not be optimal due to an excessive volume of blood received in culture bottles   SPECREQUEST  08/26/2022 1150    BOTTLES DRAWN AEROBIC AND ANAEROBIC Blood Culture adequate volume   CULT  08/26/2022 1150    NO GROWTH 5 DAYS Performed at [facility], [address] [place], [place] [address]    CULT  08/26/2022 1150    NO GROWTH 5 DAYS Performed at [facility], [address], [place], [place] [address]    REPTSTATUS 08/31/2022 FINAL 08/26/2022 1150   REPTSTATUS 08/31/2022 FINAL 08/26/2022 1150  Radiology Studies: No results found.   LOS: 12 days   Lanae Boast, MD [facility]  09/06/2022, 10:11 AM

## 2022-09-06 NOTE — TOC Benefit Eligibility Note (Signed)
Patient Product/process development scientist completed.    The patient is currently admitted and upon discharge could be taking Eliquis.  The current 30 day co-pay is $525.97.   The patient is currently admitted and upon discharge could be taking Xarelto.  The current 30 day co-pay is $522.06.    The patient is insured through [employer]   This test claim was processed through [facility]- copay amounts may vary at other pharmacies due to pharmacy/plan contracts, or as the patient moves through the different stages of their insurance plan.

## 2022-09-06 NOTE — Progress Notes (Signed)
Mobility Specialist: Progress Note   09/06/22 1550  Mobility  Activity Transferred from chair to bed  Level of Assistance Maximum assist, patient does 25-49%  Assistive Device Stedy  Activity Response Tolerated well  Mobility Referral Yes  $Mobility charge 1 Mobility  Mobility Specialist Start Time (ACUTE ONLY) 1536  Mobility Specialist Stop Time (ACUTE ONLY) 1546  Mobility Specialist Time Calculation (min) (ACUTE ONLY) 10 min   Pre-Mobility: 53 HR Post-Mobility: 68 HR  Pt assisted back to bed per request. Heavy posterior lean when standing requiring verbal cues for posture. No c/o throughout. Pt is in the bed with call bell at his side and RN present in the room.   Katori Wirsing Mobility Specialist Please contact via SecureChat or Rehab office at [phone]

## 2022-09-06 NOTE — Progress Notes (Signed)
ANTICOAGULATION CONSULT NOTE - Initial Consult  Pharmacy Consult for heparin  Indication: atrial fibrillation  Allergies  Allergen Reactions   Codeine Swelling    Patient Measurements: Height: 5\' 7"  (170.2 cm) Weight: 82.1 kg (181 lb) IBW/kg (Calculated) : 66.1 HEPARIN DW (KG): 82.1   Vital Signs: Temp: 98.2 F (36.8 C) (05/20 0725) Temp Source: Oral (05/20 0725) BP: 134/64 (05/20 1250) Pulse Rate: 56 (05/20 0725)  Labs: Recent Labs    09/06/22 0218  HGB 8.1*  HCT 25.2*  PLT 284  CREATININE 1.08    Estimated Creatinine Clearance: 44.8 mL/min (by C-G formula based on SCr of 1.08 mg/dL).   Medical History: Past Medical History:  Diagnosis Date   Hypertension    Kidney calculus 2014;2013   Pneumonia    Prostate cancer (HCC)    Shortness of breath     Medications:  Medications Prior to Admission  Medication Sig Dispense Refill Last Dose   alendronate (FOSAMAX) 70 MG tablet TAKE 1 TABLET EVERY WEEK IN THE MORNING 30 MIN BEFORE EATING WITH AN 8OZ GLASS OF WATER (SIT UP 30 MIN) 4 tablet 11 Past Week   aspirin EC 81 MG tablet Take 81 mg by mouth daily.   08/24/2022   benazepril (LOTENSIN) 10 MG tablet Take 10 mg by mouth daily.   08/24/2022   fish oil-omega-3 fatty acids 1000 MG capsule Take 1 g by mouth daily.   08/24/2022   hydrochlorothiazide (HYDRODIURIL) 25 MG tablet Take 25 mg by mouth daily.   08/24/2022   tamsulosin (FLOMAX) 0.4 MG CAPS capsule Take 0.4 mg by mouth daily after supper.   Past Week   furosemide (LASIX) 20 MG tablet Take 20-40 mg by mouth daily as needed for fluid.  (Patient not taking: Reported on 08/25/2022)   Not Taking   Scheduled:   aspirin EC  81 mg Oral Daily   atorvastatin  40 mg Oral Daily   clopidogrel  75 mg Oral Q breakfast   cyanocobalamin  1,000 mcg Intramuscular Daily   dextromethorphan-guaiFENesin  1 tablet Oral BID   furosemide  20 mg Intravenous Daily   heparin  5,000 Units Subcutaneous Q8H   leptospermum manuka honey  1  Application Topical Daily   multivitamin with minerals  1 tablet Oral Daily   pantoprazole  40 mg Oral Daily   sodium chloride flush  3 mL Intravenous Q12H   tamsulosin  0.4 mg Oral QPC supper    Assessment: 89 yo male with new afib to start heparin. He is noted with PAD s/p balloon angioplasty on plavix and asa -heparin sq given ~ 1: 30pm -hg= 8.1  Goal of Therapy:  Heparin level 0.3-0.7 units/ml Monitor platelets by anticoagulation protocol: Yes   Plan:  -No heparin bolus -Begin heparin at 1000 units.hr -Heparin level in 8 hours and daily wth CBC daily  Harland German, PharmD Clinical Pharmacist **Pharmacist phone directory can now be found on [email or web address] ([email or web address]).  Listed under [facility].

## 2022-09-07 LAB — BASIC METABOLIC PANEL
Anion gap: 7 (ref 5–15)
BUN: 25 mg/dL — ABNORMAL HIGH (ref 8–23)
CO2: 25 mmol/L (ref 22–32)
Calcium: 8 mg/dL — ABNORMAL LOW (ref 8.9–10.3)
Chloride: 102 mmol/L (ref 98–111)
Creatinine, Ser: 1.11 mg/dL (ref 0.61–1.24)
GFR, Estimated: >60 mL/min (ref >60)
Glucose, Bld: 89 mg/dL (ref 70–99)
Potassium: 3.6 mmol/L (ref 3.5–5.1)
Sodium: 134 mmol/L — ABNORMAL LOW (ref 135–145)

## 2022-09-07 LAB — CBC
HCT: 24.2 % — ABNORMAL LOW (ref 39.0–52.0)
Hemoglobin: 7.8 g/dL — ABNORMAL LOW (ref 13.0–17.0)
MCH: 33.5 pg (ref 26.0–34.0)
MCHC: 32.2 g/dL (ref 30.0–36.0)
MCV: 103.9 fL — ABNORMAL HIGH (ref 80.0–100.0)
Platelets: 291 10*3/uL (ref 150–400)
RBC: 2.33 MIL/uL — ABNORMAL LOW (ref 4.22–5.81)
RDW: 15.2 % (ref 11.5–15.5)
WBC: 7.6 10*3/uL (ref 4.0–10.5)
nRBC: 0 % (ref 0.0–0.2)

## 2022-09-07 LAB — TYPE AND SCREEN

## 2022-09-07 LAB — BPAM RBC: Blood Product Expiration Date: 202406182359

## 2022-09-07 LAB — ABO/RH: ABO/RH(D): AB POS

## 2022-09-07 LAB — HEPARIN LEVEL (UNFRACTIONATED): Heparin Unfractionated: 0.59 IU/mL (ref 0.30–0.70)

## 2022-09-07 MED ORDER — ENSURE PRE-SURGERY PO LIQD
296.0000 mL | Freq: Once | ORAL | Status: AC
  Administered 2022-09-08: 296 mL via ORAL
  Filled 2022-09-07 (×2): qty 296

## 2022-09-07 NOTE — Progress Notes (Signed)
Physical Therapy Treatment Patient Details Name: [name] Sr. MRN: [id number] DOB: Feb 17, 1929 Today's Date: 09/07/2022   History of Present Illness 89 y.o. male [facility] due to generalized progressive weakness. 5/8 found to be febrile with leukocytosis, started on antibiotics for sepsis with unclear source. 5/9 Transferred to [facility] hospital found to have gram-positive cocci bacteremia with possible R foot osteomyelitis. Patient underwent balloon angioplasty of the posterior tibial artery by the vascular surgery team 5/16. Orthopedic team plans right foot fifth ray amputation on 5/22. PMH includes hypertension, BPH, prostate cancer.    PT Comments    Pt received in supine, agreeable to therapy session and with good participation and tolerance for seated balance and transfer training with emphasis on AROM in supine/seated postures, seated reaching and leaning for core strengthening and building standing tolerance with dense cues for improved hip extension/glute activation. Pt standing tolerance limited due to fatigue as well as bowel incontinence, pt had a BM on BSC during session and needed Stedy sit<>stand lift for totalA for pivotal transfers as pt unable to maintain upright hip/trunk extension while standing this date and tends to heavy posterior lean. Up to +2 modA for transfers and +1 mod/maxA for static standing when he fatigues. Pt continues to benefit from PT services to progress toward functional mobility goals.   Recommendations for follow up therapy are one component of a multi-disciplinary discharge planning process, led by the attending physician.  Recommendations may be updated based on patient status, additional functional criteria and insurance authorization.  Follow Up Recommendations  Can patient physically be transported by private vehicle: No    Assistance Recommended at Discharge Frequent or constant Supervision/Assistance  Patient can return home with  the following Two people to help with walking and/or transfers;A lot of help with bathing/dressing/bathroom;Assistance with cooking/housework;Assist for transportation;Help with stairs or ramp for entrance   Equipment Recommendations  None recommended by PT;Other (comment) (TBD)    Recommendations for Other Services       Precautions / Restrictions Precautions Precautions: Fall Precaution Comments: L heel, R buttocks pressure injuries; HoH Restrictions Weight Bearing Restrictions: No     Mobility  Bed Mobility Overal bed mobility: Needs Assistance Bed Mobility: Sit to Supine, Rolling, Sidelying to Sit Rolling: Min assist Sidelying to sit: Mod assist, +2 for safety/equipment, HOB elevated       General bed mobility comments: use of bed features to achieve upright on L EOB, at first pt attempting to bring legs to EOB but not effectively advancing body, so instead had him roll to his L side with multimodal cues then push up from his L elbow.    Transfers Overall transfer level: Needs assistance Equipment used: Ambulation equipment used Transfers: Sit to/from Stand, Bed to chair/wheelchair/BSC Sit to Stand: Mod assist, +2 physical assistance, From elevated surface           General transfer comment: multimodal cues, increased time to perform sit<>stand from EOB, from elevated Stedy seat x3 reps and from BSC/to recliner x1 rep each. (x5 total including from [facility] surface flaps) Transfer via Lift Equipment: Stedy  Ambulation/Gait               General Gait Details: Pt unable   Stairs             Wheelchair Mobility    Modified Rankin (Stroke Patients Only)       Balance Overall balance assessment: Needs assistance Sitting-balance support: Bilateral upper extremity supported, Feet supported Sitting balance-Leahy  Scale: Fair     Standing balance support: Bilateral upper extremity supported, During functional activity, Reliant on assistive device for  balance Standing balance-Leahy Scale: Poor Standing balance comment: +1-2 modA in Stedy, increased assist as he fatigues                            Cognition Arousal/Alertness: Awake/alert Behavior During Therapy: WFL for tasks assessed/performed Overall Cognitive Status: Within Functional Limits for tasks assessed                                 General Comments: slow processing; does better with multimodal cues        Exercises General Exercises - Lower Extremity Ankle Circles/Pumps: AROM, Both, 10 reps, Supine Long Arc Quad: AROM, Seated, Both, 5 reps Heel Slides: AROM, 5 reps, Supine, Right Hip ABduction/ADduction: AROM, Both, 5 reps, Supine Straight Leg Raises: AAROM, Both, Supine (x3 reps) Other Exercises: lateral/anterior reaching x5 reps in ea direction, emphasis on chest raising and midline posure between reaches Other Exercises: static standing in Stedy x1-3 minutes x3 trials for BLE strengthening Other Exercises: IS x 10 reps    General Comments General comments (skin integrity, edema, etc.): pt with episode of uncontrolled BM when standing initally from EOB, so transferred to [facility], and had significant bowel movement; RN notified; pt TotalA for peri-care standing in [place] platform      Pertinent Vitals/Pain Pain Assessment Pain Assessment: Faces Faces Pain Scale: Hurts a little bit Pain Location: not localized Pain Descriptors / Indicators: Grimacing Pain Intervention(s): Monitored during session, Limited activity within patient's tolerance, Repositioned    Home Living                          Prior Function            PT Goals (current goals can now be found in the care plan section) Acute Rehab PT Goals Patient Stated Goal: return home after rehab PT Goal Formulation: With patient Time For Goal Achievement: 09/08/22 Progress towards PT goals: Progressing toward goals    Frequency    Min 3X/week      PT Plan  Current plan remains appropriate    Co-evaluation PT/OT/SLP Co-Evaluation/Treatment: Yes Reason for Co-Treatment: Necessary to address cognition/behavior during functional activity;For patient/therapist safety;To address functional/ADL transfers PT goals addressed during session: Mobility/safety with mobility;Balance;Proper use of DME;Strengthening/ROM        AM-PAC PT "6 Clicks" Mobility   Outcome Measure  Help needed turning from your back to your side while in a flat bed without using bedrails?: A Lot Help needed moving from lying on your back to sitting on the side of a flat bed without using bedrails?: A Lot Help needed moving to and from a bed to a chair (including a wheelchair)?: Total Help needed standing up from a chair using your arms (e.g., wheelchair or bedside chair)?: A Lot Help needed to walk in hospital room?: Total Help needed climbing 3-5 steps with a railing? : Total 6 Click Score: 9    End of Session Equipment Utilized During Treatment: Gait belt Activity Tolerance: Patient tolerated treatment well Patient left: in chair;with call bell/phone within reach;with chair alarm set (heels floated) Nurse Communication: Mobility status;Need for lift equipment;Other (comment) (Stedy and +2; pt had large BM) PT Visit Diagnosis: Unsteadiness on feet (R26.81);Other abnormalities of gait and  mobility (R26.89);Muscle weakness (generalized) (M62.81)     Time: 1610-9604 PT Time Calculation (min) (ACUTE ONLY): 33 min  Charges:  $Therapeutic Exercise: 8-22 mins                     Jaymar Loeber P., PTA Acute Rehabilitation Services Secure Chat Preferred 9a-5:30pm Office: [phone]    Dorathy Kinsman [facility] 09/07/2022, 1:01 PM

## 2022-09-07 NOTE — Progress Notes (Signed)
ANTICOAGULATION CONSULT NOTE  Pharmacy Consult for heparin  Indication: atrial fibrillation Brief A/P: Heparin level within goal range Continue Heparin at current rate   Allergies  Allergen Reactions   Codeine Swelling    Patient Measurements: Height: 5\' 7"  (170.2 cm) Weight: 82.1 kg (181 lb) IBW/kg (Calculated) : 66.1 HEPARIN DW (KG): 82.1   Vital Signs: Temp: 98.4 F (36.9 C) (05/20 2312) Temp Source: Axillary (05/20 2312) BP: 126/50 (05/20 2312)  Labs: Recent Labs    09/06/22 0218 09/07/22 0147  HGB 8.1* 7.8*  HCT 25.2* 24.2*  PLT 284 291  HEPARINUNFRC  --  0.59  CREATININE 1.08 1.11     Estimated Creatinine Clearance: 43.5 mL/min (by C-G formula based on SCr of 1.11 mg/dL).  Assessment: 89 y.o. male with Afib for heparin  Goal of Therapy:  Heparin level 0.3-0.7 units/ml Monitor platelets by anticoagulation protocol: Yes   Plan:  Continue Heparin at current rate  Check heparin level in 6 hours to verify  Geannie Risen, PharmD, BCPS  Geannie Risen, PharmD, BCPS

## 2022-09-07 NOTE — Progress Notes (Signed)
PROGRESS NOTE [name]  EAV:[id number] DOB: [birth year] DOA: 08/24/2022 PCP: Elfredia Nevins, MD  Brief Narrative/Hospital Course: Jesse Pollock Sr. is a 89 y.o. male with a history of hypertension, BPH, prostate cancer presented secondary to generalized weakness, difficulty ambulation a few weeks on 08/25/22  Seen in the ED, found to have anemia, lactic acidosis underwent CT angio chest, CT abdomen pelvis, x-ray left foot right foot chest x-ray> and admitted for sepsis/fever, lactic acidosis with right foot ulcer, bronchitis Workup revealed gram-positive bacteremia, Aerococcus//Actinotignum schaalii bacteremia right foot osteomyelitis of fifth metatarsal head.  ABI noted poor circulation/critical ischemia with tissue loss at the toes- underwent angiogram  5/16:with recanalization/balloon angioplasty of the tibioperoneal trunk and resolution of flow-limiting stenosis within the posterior tibial artery by vascular Dr Zella Ball. Dr. Lajoyce Corners has been consulted> planning for right foot fifth ray amputation. Seen by infectious disease, completed antibiotics and monitoring off antibiotic Seen by cardiology for new onset A-fib given his high CHA2DS2-VASc score placed on heparin drip 5/20    Subjective: Patient seen and examined, getting cleaned up, no complaints no chest pain or palpitation. Overnight afebrile BP stable heart rate down up to 40s Labs showed hemoglobin at 7.8 gm  Assessment and Plan: Principal Problem:   Polymicrobial bacterial infection Active Problems:   Fever of undetermined origin   Lactic acidosis   Generalized weakness   Bronchitis   Bilateral pleural effusion   Right foot ulcer (HCC)   Essential hypertension   BPH (benign prostatic hyperplasia)   Paroxysmal atrial fibrillation (HCC)   Osteomyelitis of fifth toe of right foot (HCC)   Bacteremia   Acute cystitis without hematuria   PVD (peripheral vascular disease) (HCC)   Sepsis POA w/ Gram-positive  bacteremia/Aerococcus//Actinotignum schaalii bacteremia Completed antibiotics ID following monitoring off antibiotics.  Seen by vascular and podiatry see below.  Awaiting for amputation  Right foot osteomyelitis of fifth metatarsal head: Critical critical ischemia with tissue loss at the toes: S/p recanalization/balloon angioplasty of the tibioperoneal trunk and resolution of flow-limiting stenosis within the posterior tibial artery by vascular Dr Zella Ball.Dr. Lajoyce Corners planning for right foot fifth ray amputation. ID requesting culture from surgical site. Continue pain control, continue aspirin, Plavix Lipitor.  A-fib with slow ventricular response rate:Echo with normal EF.  Appears new onset- w/ high chads2vasc score high of 5, appreciate input from cardiology, heparin drip started 5/20 will eventually need to switch to Eliquis and adjust antiplatelets.  Not needing rate controlling agent.  Monitoring in telemetry.  Peripheral neuropathy continue B12 supplement. Debility deconditioning weakness continue PT OT, recommending skilled nursing facility upon discharge Prostate cancer followed by urology continue Flomax sclerotic focus noted on left second rib and nonspecific-May need to discuss with IR prior to discharge or close follow up w/ urology.  Hyponatremia:Mild,monitor.  Anemia of chronic disease and b12 deficiency: anemia panel w/ iron  saturation 60 normal I folate, B12 was previously low on 5/7 < 50 and on B12 supplement IM daily.  Monitor and transfuse Recent Labs  Lab 08/31/22 1257 09/02/22 0039 09/06/22 0218 09/07/22 0147  HGB 8.9* 9.1* 8.1* 7.8*  HCT 28.1* 28.6* 25.2* 24.2*    Pressure injury of the left  heel, right buttock POA  DVT prophylaxis: SCDs Start: 08/25/22 0026 Code Status:   Code Status: Full Code Family Communication: plan of care discussed with patient at bedside. Patient status is:  inpatient  because of critical limb ischemia Level of care: Progressive Cardiac    Dispo: The patient is from:  home, lives alone has family nearby, and daughter now lives with him            Anticipated disposition: TBD in Objective: Vitals last 24 hrs: Vitals:   09/06/22 1949 09/06/22 2312 09/07/22 0401 09/07/22 0821  BP: (!) 129/56 (!) 126/50 (!) 127/52 120/65  Pulse:      Resp: 19 18 14 19   Temp: 98 F (36.7 C) 98.4 F (36.9 C) 97.8 F (36.6 C) 98.6 F (37 C)  TempSrc: Oral Axillary Axillary   SpO2:    100%  Weight:      Height:       Weight change:   Physical Examination: General exam: AAox3, elderly, frail-appearing weak,older appearing HEENT:Oral mucosa moist, Ear/Nose WNL grossly, dentition normal. Respiratory system: bilaterally clear BS, no use of accessory muscle Cardiovascular system: S1 & S2 +, M0. Gastrointestinal system: Abdomen soft, NT,ND,BS+ Nervous System:Alert, awake, moving extremities and grossly nonfocal Extremities: LE ankle edema mild, dresing+, lower extremities warm Skin: No rashes,no icterus. MSK: Normal muscle bulk,tone, power   Medications reviewed:  Scheduled Meds:  aspirin EC  81 mg Oral Daily   atorvastatin  40 mg Oral Daily   clopidogrel  75 mg Oral Q breakfast   cyanocobalamin  1,000 mcg Intramuscular Daily   dextromethorphan-guaiFENesin  1 tablet Oral BID   furosemide  20 mg Intravenous Daily   leptospermum manuka honey  1 Application Topical Daily   multivitamin with minerals  1 tablet Oral Daily   omega-3 acid ethyl esters  1 g Oral Daily   pantoprazole  40 mg Oral Daily   senna-docusate  1 tablet Oral BID   sodium chloride flush  3 mL Intravenous Q12H   tamsulosin  0.4 mg Oral QPC supper   Continuous Infusions:  sodium chloride     heparin 1,000 Units/hr (09/06/22 1643)   Diet Order             Diet Heart Room service appropriate? Yes with Assist; Fluid consistency: Thin  Diet effective now                  Intake/Output Summary (Last 24 hours) at 09/07/2022 1035 Last data filed at 09/07/2022  0949 Gross per 24 hour  Intake 603.3 ml  Output 1501 ml  Net -897.7 ml   Net IO Since Admission: -1,367.46 mL [09/07/22 1035]  Wt Readings from Last 3 Encounters:  08/24/22 82.1 kg  07/06/22 97.5 kg  05/20/20 97.5 kg     Unresulted Labs (From admission, onward)     Start     Ordered   09/08/22 0500  Heparin level (unfractionated)  Daily,   R     Question:  Specimen collection method  Answer:  Lab=Lab collect   09/06/22 1429   09/07/22 1000  Heparin level (unfractionated)  Once-Timed,   TIMED       Question:  Specimen collection method  Answer:  Lab=Lab collect   09/07/22 0308   09/07/22 0500  Basic metabolic panel  Daily,   R     Question:  Specimen collection method  Answer:  Lab=Lab collect   09/06/22 1041   09/07/22 0500  CBC  Daily,   R     Question:  Specimen collection method  Answer:  Lab=Lab collect   09/06/22 1041          Data Reviewed: I have personally reviewed following labs and imaging studies CBC: Recent Labs  Lab 08/31/22 1257 09/02/22 0039 09/06/22 0218 09/07/22 0147  WBC 7.3 6.5 7.4 7.6  NEUTROABS  --   --  3.7  --   HGB 8.9* 9.1* 8.1* 7.8*  HCT 28.1* 28.6* 25.2* 24.2*  MCV 103.3* 104.0* 104.6* 103.9*  PLT 277 274 284 291   Basic Metabolic Panel: Recent Labs  Lab 09/02/22 0039 09/06/22 0218 09/07/22 0147  NA 133* 132* 134*  K 3.8 3.7 3.6  CL 98 99 102  CO2 25 26 25   GLUCOSE 104* 106* 89  BUN 22 22 25*  CREATININE 1.02 1.08 1.11  CALCIUM 8.3* 8.0* 8.0*  MG  --  2.1  --   PHOS  --  3.7  --    Recent Labs  Lab 09/06/22 0218  ALBUMIN 2.5*  No results for input(s): "PROCALCITON", "LATICACIDVEN" in the last 168 hours.  Recent Results (from the past 240 hour(s))  Surgical PCR screen     Status: None   Collection Time: 09/02/22  1:21 AM   Specimen: Nasal Mucosa; Nasal Swab  Result Value Ref Range Status   MRSA, PCR NEGATIVE NEGATIVE Final   Staphylococcus aureus NEGATIVE NEGATIVE Final    Comment: (NOTE) The Xpert SA Assay (FDA  approved for NASAL specimens in patients 77 years of age and older), is one component of a comprehensive surveillance program. It is not intended to diagnose infection nor to guide or monitor treatment. Performed at [facility], [address] [place], [place] [address]     Antimicrobials: Anti-infectives (From admission, onward)    Start     Dose/Rate Route Frequency Ordered Stop   08/31/22 1700  Ampicillin-Sulbactam (UNASYN) 3 g in sodium chloride 0.9 % 100 mL IVPB  Status:  Discontinued        3 g 200 mL/hr over 30 Minutes Intravenous Every 6 hours 08/31/22 1028 09/01/22 0911   08/30/22 2200  cefTRIAXone (ROCEPHIN) 2 g in sodium chloride 0.9 % 100 mL IVPB  Status:  Discontinued        2 g 200 mL/hr over 30 Minutes Intravenous Every 24 hours 08/30/22 1143 08/31/22 1028   08/30/22 1615  amoxicillin (AMOXIL) capsule 500 mg        500 mg Oral  Once 08/30/22 1522 08/30/22 1730   08/30/22 1415  amoxicillin (AMOXIL) capsule 500 mg  Status:  Discontinued        500 mg Oral  Once 08/30/22 1318 08/30/22 1522   08/25/22 2200  vancomycin (VANCOCIN) IVPB 1000 mg/200 mL premix  Status:  Discontinued        1,000 mg 200 mL/hr over 60 Minutes Intravenous Every 24 hours 08/24/22 2126 08/30/22 1143   08/25/22 1800  metroNIDAZOLE (FLAGYL) tablet 500 mg  Status:  Discontinued        500 mg Oral Every 12 hours 08/25/22 1709 08/31/22 1028   08/24/22 2130  ceFEPIme (MAXIPIME) 2 g in sodium chloride 0.9 % 100 mL IVPB  Status:  Discontinued        2 g 200 mL/hr over 30 Minutes Intravenous  Once 08/24/22 2120 08/24/22 2126   08/24/22 2130  metroNIDAZOLE (FLAGYL) IVPB 500 mg        500 mg 100 mL/hr over 60 Minutes Intravenous  Once 08/24/22 2120 08/24/22 2312   08/24/22 2130  vancomycin (VANCOCIN) IVPB 1000 mg/200 mL premix  Status:  Discontinued        1,000 mg 200 mL/hr over 60 Minutes Intravenous  Once 08/24/22 2120 08/24/22 2126   08/24/22 2130  ceFEPIme (MAXIPIME) 2 g in  sodium chloride 0.9  % 100 mL IVPB  Status:  Discontinued        2 g 200 mL/hr over 30 Minutes Intravenous Every 12 hours 08/24/22 2126 08/26/22 1426   08/24/22 2130  vancomycin (VANCOREADY) IVPB 1250 mg/250 mL        1,250 mg 166.7 mL/hr over 90 Minutes Intravenous  Once 08/24/22 2126 08/25/22 0029      Culture/Microbiology    Component Value Date/Time   SDES BLOOD RIGHT HAND 08/26/2022 1150   SDES RIGHT ANTECUBITAL 08/26/2022 1150   SPECREQUEST  08/26/2022 1150    BOTTLES DRAWN AEROBIC AND ANAEROBIC Blood Culture results may not be optimal due to an excessive volume of blood received in culture bottles   SPECREQUEST  08/26/2022 1150    BOTTLES DRAWN AEROBIC AND ANAEROBIC Blood Culture adequate volume   CULT  08/26/2022 1150    NO GROWTH 5 DAYS Performed at [facility], [address], [place], [place] [address]    CULT  08/26/2022 1150    NO GROWTH 5 DAYS Performed at [facility], [address], [place], [place] [address]    REPTSTATUS 08/31/2022 FINAL 08/26/2022 1150   REPTSTATUS 08/31/2022 FINAL 08/26/2022 1150  Radiology Studies: No results found.   LOS: 13 days   Lanae Boast, MD [facility]  09/07/2022, 10:35 AM

## 2022-09-07 NOTE — Progress Notes (Signed)
Occupational Therapy Treatment Patient Details Name: Jesse CORMICAN Sr. MRN: [id number] DOB: 1929-04-11 Today's Date: 09/07/2022   History of present illness 89 y.o. male [facility]/24/2022 due to generalized progressive weakness. 5/8 found to be febrile with leukocytosis, started on antibiotics for sepsis with unclear source. 5/9 Transferred to [facility] hospital found to have gram-positive cocci bacteremia with possible R foot osteomyelitis. Patient underwent balloon angioplasty of the posterior tibial artery by the vascular surgery team 5/16. Orthopedic team plans right foot fifth ray amputation on 5/22. PMH includes hypertension, BPH, prostate cancer.   OT comments  Patient received in supine and eager to participate. Patient requires frequent multimodal cues to perform most tasks. Patient performed standing from EOB with mod assist +2 into Stedy and began to have BM and was assist to [facility] with Stedy. Patient able to stand in [place] for toilet hygiene with assistance of one for balance and assistance of another for cleaning. Patient will benefit from continued inpatient follow up therapy, <3 hours/day to address functional transfers and self care. Acute OT to continue to follow.    Recommendations for follow up therapy are one component of a multi-disciplinary discharge planning process, led by the attending physician.  Recommendations may be updated based on patient status, additional functional criteria and insurance authorization.    Assistance Recommended at Discharge Frequent or constant Supervision/Assistance  Patient can return home with the following  A lot of help with walking and/or transfers;A lot of help with bathing/dressing/bathroom;Assistance with cooking/housework;Assist for transportation;Help with stairs or ramp for entrance   Equipment Recommendations  None recommended by OT    Recommendations for Other Services      Precautions / Restrictions Precautions Precautions:  Fall Precaution Comments: L heel, R buttocks pressure injuries; HoH Restrictions Weight Bearing Restrictions: No       Mobility Bed Mobility Overal bed mobility: Needs Assistance Bed Mobility: Sit to Supine, Rolling, Sidelying to Sit Rolling: Min assist Sidelying to sit: Mod assist, +2 for safety/equipment, HOB elevated       General bed mobility comments: frequent cues to use rail and for body mechanics    Transfers Overall transfer level: Needs assistance Equipment used: Ambulation equipment used Transfers: Sit to/from Stand, Bed to chair/wheelchair/BSC Sit to Stand: Mod assist, +2 physical assistance, From elevated surface           General transfer comment: multimodal cues to use correct body mechanics and assist with sit to stands Transfer via Lift Equipment: Stedy   Balance Overall balance assessment: Needs assistance Sitting-balance support: Bilateral upper extremity supported, Feet supported Sitting balance-Leahy Scale: Fair     Standing balance support: Bilateral upper extremity supported, During functional activity, Reliant on assistive device for balance Standing balance-Leahy Scale: Poor Standing balance comment: cues to lean forwards and for posture                           ADL either performed or assessed with clinical judgement   ADL Overall ADL's : Needs assistance/impaired                 Upper Body Dressing : Minimal assistance;Sitting Upper Body Dressing Details (indicate cue type and reason): gown change Lower Body Dressing: Maximal assistance;Sitting/lateral leans   Toilet Transfer: Moderate assistance;+2 for physical assistance;BSC/3in1 Toilet Transfer Details (indicate cue type and reason): stedy used for transfer to [facility] Toileting- Clothing Manipulation and Hygiene: Total assistance;Sit to/from stand Toileting - Architect Details (  indicate cue type and reason): patient stood in [place] for toilet hygiene with  assistance of another to assist with balance            Extremity/Trunk Assessment              Vision       Perception     Praxis      Cognition Arousal/Alertness: Awake/alert Behavior During Therapy: WFL for tasks assessed/performed Overall Cognitive Status: Within Functional Limits for tasks assessed                                 General Comments: frequent cueing        Exercises      Shoulder Instructions       General Comments pt with episode of uncontrolled BM when standing initally from EOB, so transferred to [facility], and had significant bowel movement; RN notified; pt TotalA for peri-care standing in [place] platform    Pertinent Vitals/ Pain       Pain Assessment Pain Assessment: Faces Faces Pain Scale: Hurts a little bit Pain Location: not localized Pain Descriptors / Indicators: Grimacing Pain Intervention(s): Monitored during session, Repositioned  Home Living                                          Prior Functioning/Environment              Frequency  Min 1X/week        Progress Toward Goals  OT Goals(current goals can now be found in the care plan section)  Progress towards OT goals: Progressing toward goals  Acute Rehab OT Goals Patient Stated Goal: get better OT Goal Formulation: With patient Time For Goal Achievement: 09/08/22 Potential to Achieve Goals: Fair ADL Goals Pt Will Perform Grooming: with modified independence;sitting Pt Will Perform Upper Body Dressing: with supervision;sitting Pt Will Perform Lower Body Dressing: with mod assist;sitting/lateral leans;with adaptive equipment Pt Will Transfer to Toilet: with min guard assist;squat pivot transfer Pt/caregiver will Perform Home Exercise Program: Increased strength;Both right and left upper extremity;Independently  Plan Discharge plan remains appropriate    Co-evaluation    PT/OT/SLP Co-Evaluation/Treatment: Yes Reason for  Co-Treatment: Necessary to address cognition/behavior during functional activity;For patient/therapist safety;To address functional/ADL transfers PT goals addressed during session: Mobility/safety with mobility;Balance;Proper use of DME;Strengthening/ROM OT goals addressed during session: ADL's and self-care      AM-PAC OT "6 Clicks" Daily Activity     Outcome Measure   Help from another person eating meals?: None Help from another person taking care of personal grooming?: A Little Help from another person toileting, which includes using toliet, bedpan, or urinal?: A Lot Help from another person bathing (including washing, rinsing, drying)?: A Lot Help from another person to put on and taking off regular upper body clothing?: A Lot Help from another person to put on and taking off regular lower body clothing?: Total 6 Click Score: 14    End of Session Equipment Utilized During Treatment: Gait belt;Other (comment) Antony Salmon)  OT Visit Diagnosis: Unsteadiness on feet (R26.81);Other abnormalities of gait and mobility (R26.89);Muscle weakness (generalized) (M62.81)   Activity Tolerance Patient tolerated treatment well   Patient Left in chair;with call bell/phone within reach;with chair alarm set   Nurse Communication Mobility status;Other (comment) (used BSC)  Time: 7253-6644 OT Time Calculation (min): 33 min  Charges: OT General Charges $OT Visit: 1 Visit OT Treatments $Self Care/Home Management : 8-22 mins  Alfonse Flavors, OTA Acute Rehabilitation Services  Office [phone]   Dewain Penning 09/07/2022, 1:19 PM

## 2022-09-08 ENCOUNTER — Inpatient Hospital Stay (HOSPITAL_COMMUNITY): Payer: Medicare Other | Admitting: Certified Registered"

## 2022-09-08 ENCOUNTER — Encounter (HOSPITAL_COMMUNITY)
Admission: EM | Disposition: A | Discharge status: Rehabilitation Facility | Payer: Self-pay | Source: Home / Self Care | Attending: Family Medicine

## 2022-09-08 ENCOUNTER — Other Ambulatory Visit: Payer: Self-pay

## 2022-09-08 ENCOUNTER — Encounter (HOSPITAL_COMMUNITY): Payer: Self-pay | Admitting: Internal Medicine

## 2022-09-08 DIAGNOSIS — M869 Osteomyelitis, unspecified: Secondary | ICD-10-CM

## 2022-09-08 DIAGNOSIS — I4891 Unspecified atrial fibrillation: Secondary | ICD-10-CM

## 2022-09-08 DIAGNOSIS — I739 Peripheral vascular disease, unspecified: Secondary | ICD-10-CM

## 2022-09-08 DIAGNOSIS — I10 Essential (primary) hypertension: Secondary | ICD-10-CM

## 2022-09-08 HISTORY — PX: AMPUTATION: SHX166

## 2022-09-08 LAB — CBC
HCT: 23.9 % — ABNORMAL LOW (ref 39.0–52.0)
Hemoglobin: 7.6 g/dL — ABNORMAL LOW (ref 13.0–17.0)
MCH: 33 pg (ref 26.0–34.0)
MCHC: 31.8 g/dL (ref 30.0–36.0)
MCV: 103.9 fL — ABNORMAL HIGH (ref 80.0–100.0)
Platelets: 301 10*3/uL (ref 150–400)
RBC: 2.3 MIL/uL — ABNORMAL LOW (ref 4.22–5.81)
RDW: 15.3 % (ref 11.5–15.5)
WBC: 8.9 10*3/uL (ref 4.0–10.5)
nRBC: 0 % (ref 0.0–0.2)

## 2022-09-08 LAB — BASIC METABOLIC PANEL
Anion gap: 7 (ref 5–15)
BUN: 27 mg/dL — ABNORMAL HIGH (ref 8–23)
CO2: 26 mmol/L (ref 22–32)
Calcium: 8 mg/dL — ABNORMAL LOW (ref 8.9–10.3)
Chloride: 98 mmol/L (ref 98–111)
Creatinine, Ser: 1.14 mg/dL (ref 0.61–1.24)
GFR, Estimated: >60 mL/min (ref >60)
Glucose, Bld: 126 mg/dL — ABNORMAL HIGH (ref 70–99)
Potassium: 4.1 mmol/L (ref 3.5–5.1)
Sodium: 131 mmol/L — ABNORMAL LOW (ref 135–145)

## 2022-09-08 LAB — HEPARIN LEVEL (UNFRACTIONATED): Heparin Unfractionated: 0.89 IU/mL — ABNORMAL HIGH (ref 0.30–0.70)

## 2022-09-08 SURGERY — AMPUTATION, FOOT, RAY
Anesthesia: Monitor Anesthesia Care | Site: Foot | Laterality: Right

## 2022-09-08 MED ORDER — LACTATED RINGERS IV SOLN: INTRAVENOUS | Status: DC

## 2022-09-08 MED ORDER — SODIUM CHLORIDE 0.9 % IV SOLN: INTRAVENOUS | Status: DC

## 2022-09-08 MED ORDER — JUVEN PO PACK
1.0000 | PACK | Freq: Two times a day (BID) | ORAL | Status: DC
  Administered 2022-09-08 – 2022-09-10 (×4): 1 via ORAL
  Filled 2022-09-08 (×4): qty 1

## 2022-09-08 MED ORDER — FENTANYL CITRATE PF 50 MCG/ML IJ SOSY: 50.0000 ug | PREFILLED_SYRINGE | Freq: Once | INTRAMUSCULAR | Status: AC

## 2022-09-08 MED ORDER — LABETALOL HCL 5 MG/ML IV SOLN: 10.0000 mg | INTRAVENOUS | Status: DC | PRN

## 2022-09-08 MED ORDER — GUAIFENESIN-DM 100-10 MG/5ML PO SYRP: 15.0000 mL | ORAL_SOLUTION | ORAL | Status: DC | PRN

## 2022-09-08 MED ORDER — POTASSIUM CHLORIDE CRYS ER 20 MEQ PO TBCR: 20.0000 meq | EXTENDED_RELEASE_TABLET | Freq: Every day | ORAL | Status: DC | PRN

## 2022-09-08 MED ORDER — CEFAZOLIN SODIUM-DEXTROSE 2-4 GM/100ML-% IV SOLN
2.0000 g | INTRAVENOUS | Status: AC
  Administered 2022-09-08: 2 g via INTRAVENOUS
  Filled 2022-09-08: qty 100

## 2022-09-08 MED ORDER — PANTOPRAZOLE SODIUM 40 MG PO TBEC: 40.0000 mg | DELAYED_RELEASE_TABLET | Freq: Every day | ORAL | Status: DC

## 2022-09-08 MED ORDER — 0.9 % SODIUM CHLORIDE (POUR BTL) OPTIME
TOPICAL | Status: DC | PRN
  Administered 2022-09-08: 1000 mL

## 2022-09-08 MED ORDER — OXYCODONE HCL 5 MG PO TABS: 5.0000 mg | ORAL_TABLET | Freq: Once | ORAL | Status: DC | PRN

## 2022-09-08 MED ORDER — SODIUM CHLORIDE 0.9 % IV SOLN
8.0000 mg/kg | Freq: Every day | INTRAVENOUS | Status: DC
  Administered 2022-09-08: 650 mg via INTRAVENOUS
  Filled 2022-09-08 (×2): qty 13

## 2022-09-08 MED ORDER — PROPOFOL 500 MG/50ML IV EMUL
INTRAVENOUS | Status: DC | PRN
  Administered 2022-09-08: 50 ug/kg/min via INTRAVENOUS

## 2022-09-08 MED ORDER — FENTANYL CITRATE (PF) 100 MCG/2ML IJ SOLN
INTRAMUSCULAR | Status: AC
  Administered 2022-09-08: 50 ug
  Filled 2022-09-08: qty 2

## 2022-09-08 MED ORDER — OXYCODONE HCL 5 MG PO TABS: 10.0000 mg | ORAL_TABLET | ORAL | Status: DC | PRN

## 2022-09-08 MED ORDER — ZINC SULFATE 220 (50 ZN) MG PO CAPS
220.0000 mg | ORAL_CAPSULE | Freq: Every day | ORAL | Status: DC
  Administered 2022-09-08 – 2022-09-10 (×3): 220 mg via ORAL
  Filled 2022-09-08 (×3): qty 1

## 2022-09-08 MED ORDER — DEXAMETHASONE SODIUM PHOSPHATE 10 MG/ML IJ SOLN
INTRAMUSCULAR | Status: DC | PRN
  Administered 2022-09-08: 5 mg

## 2022-09-08 MED ORDER — OXYCODONE HCL 5 MG PO TABS: 5.0000 mg | ORAL_TABLET | ORAL | Status: DC | PRN

## 2022-09-08 MED ORDER — SODIUM CHLORIDE 0.9 % IV SOLN
2.0000 g | INTRAVENOUS | Status: DC
  Administered 2022-09-08: 2 g via INTRAVENOUS
  Filled 2022-09-08: qty 20

## 2022-09-08 MED ORDER — HYDROMORPHONE HCL 1 MG/ML IJ SOLN: 0.5000 mg | INTRAMUSCULAR | Status: DC | PRN

## 2022-09-08 MED ORDER — ONDANSETRON HCL 4 MG/2ML IJ SOLN: 4.0000 mg | Freq: Four times a day (QID) | INTRAMUSCULAR | Status: DC | PRN

## 2022-09-08 MED ORDER — CHLORHEXIDINE GLUCONATE 4 % EX SOLN
60.0000 mL | Freq: Once | CUTANEOUS | Status: AC
  Administered 2022-09-08: 4 via TOPICAL
  Filled 2022-09-08: qty 60

## 2022-09-08 MED ORDER — CHLORHEXIDINE GLUCONATE 0.12 % MT SOLN
OROMUCOSAL | Status: AC
  Administered 2022-09-08: 15 mL via OROMUCOSAL
  Filled 2022-09-08: qty 15

## 2022-09-08 MED ORDER — FENTANYL CITRATE (PF) 100 MCG/2ML IJ SOLN: 25.0000 ug | INTRAMUSCULAR | Status: DC | PRN

## 2022-09-08 MED ORDER — MAGNESIUM CITRATE PO SOLN: 1.0000 | Freq: Once | ORAL | Status: DC | PRN

## 2022-09-08 MED ORDER — OXYCODONE HCL 5 MG/5ML PO SOLN: 5.0000 mg | Freq: Once | ORAL | Status: DC | PRN

## 2022-09-08 MED ORDER — HYDRALAZINE HCL 20 MG/ML IJ SOLN: 5.0000 mg | INTRAMUSCULAR | Status: DC | PRN

## 2022-09-08 MED ORDER — ACETAMINOPHEN 500 MG PO TABS
1000.0000 mg | ORAL_TABLET | Freq: Once | ORAL | Status: AC
  Administered 2022-09-08: 1000 mg via ORAL
  Filled 2022-09-08: qty 2

## 2022-09-08 MED ORDER — VITAMIN C 500 MG PO TABS
1000.0000 mg | ORAL_TABLET | Freq: Every day | ORAL | Status: DC
  Administered 2022-09-08 – 2022-09-10 (×3): 1000 mg via ORAL
  Filled 2022-09-08 (×3): qty 2

## 2022-09-08 MED ORDER — ALUM & MAG HYDROXIDE-SIMETH 200-200-20 MG/5ML PO SUSP: 15.0000 mL | ORAL | Status: DC | PRN

## 2022-09-08 MED ORDER — PHENOL 1.4 % MT LIQD: 1.0000 | OROMUCOSAL | Status: DC | PRN

## 2022-09-08 MED ORDER — POLYETHYLENE GLYCOL 3350 17 G PO PACK: 17.0000 g | PACK | Freq: Every day | ORAL | Status: DC | PRN

## 2022-09-08 MED ORDER — ACETAMINOPHEN 325 MG PO TABS
325.0000 mg | ORAL_TABLET | Freq: Four times a day (QID) | ORAL | Status: DC | PRN
  Administered 2022-09-08: 650 mg via ORAL
  Filled 2022-09-08: qty 2

## 2022-09-08 MED ORDER — BISACODYL 5 MG PO TBEC: 5.0000 mg | DELAYED_RELEASE_TABLET | Freq: Every day | ORAL | Status: DC | PRN

## 2022-09-08 MED ORDER — EPHEDRINE SULFATE-NACL 50-0.9 MG/10ML-% IV SOSY
PREFILLED_SYRINGE | INTRAVENOUS | Status: DC | PRN
  Administered 2022-09-08 (×2): 5 mg via INTRAVENOUS

## 2022-09-08 MED ORDER — POVIDONE-IODINE 10 % EX SWAB: 2.0000 | Freq: Once | CUTANEOUS | Status: DC

## 2022-09-08 MED ORDER — MAGNESIUM SULFATE 2 GM/50ML IV SOLN: 2.0000 g | Freq: Every day | INTRAVENOUS | Status: DC | PRN

## 2022-09-08 MED ORDER — METOPROLOL TARTRATE 5 MG/5ML IV SOLN: 2.0000 mg | INTRAVENOUS | Status: DC | PRN

## 2022-09-08 MED ORDER — CHLORHEXIDINE GLUCONATE 0.12 % MT SOLN: 15.0000 mL | Freq: Once | OROMUCOSAL | Status: AC

## 2022-09-08 MED ORDER — CEFAZOLIN SODIUM-DEXTROSE 2-4 GM/100ML-% IV SOLN
2.0000 g | Freq: Three times a day (TID) | INTRAVENOUS | Status: AC
  Administered 2022-09-08 – 2022-09-09 (×2): 2 g via INTRAVENOUS
  Filled 2022-09-08 (×2): qty 100

## 2022-09-08 MED ORDER — ORAL CARE MOUTH RINSE: 15.0000 mL | Freq: Once | OROMUCOSAL | Status: AC

## 2022-09-08 MED ORDER — DOCUSATE SODIUM 100 MG PO CAPS
100.0000 mg | ORAL_CAPSULE | Freq: Every day | ORAL | Status: DC
  Administered 2022-09-09 – 2022-09-10 (×2): 100 mg via ORAL
  Filled 2022-09-08 (×2): qty 1

## 2022-09-08 MED ORDER — HEPARIN (PORCINE) 25000 UT/250ML-% IV SOLN
1000.0000 [IU]/h | INTRAVENOUS | Status: DC
  Administered 2022-09-08: 1000 [IU]/h via INTRAVENOUS
  Filled 2022-09-08: qty 250

## 2022-09-08 MED ORDER — ROPIVACAINE HCL 5 MG/ML IJ SOLN
INTRAMUSCULAR | Status: DC | PRN
  Administered 2022-09-08: 25 mL via PERINEURAL

## 2022-09-08 SURGICAL SUPPLY — 37 items
BAG COUNTER SPONGE SURGICOUNT (BAG) ×2 IMPLANT
BAG SPNG CNTER NS LX DISP (BAG)
BLADE SAW SGTL MED 73X18.5 STR (BLADE) IMPLANT
BLADE SURG 21 STRL SS (BLADE) ×2 IMPLANT
BNDG CMPR 5X4 CHSV STRCH STRL (GAUZE/BANDAGES/DRESSINGS) ×1
BNDG COHESIVE 4X5 TAN STRL (GAUZE/BANDAGES/DRESSINGS) ×2 IMPLANT
BNDG COHESIVE 4X5 TAN STRL LF (GAUZE/BANDAGES/DRESSINGS) IMPLANT
BNDG GAUZE DERMACEA FLUFF 4 (GAUZE/BANDAGES/DRESSINGS) ×2 IMPLANT
BNDG GZE DERMACEA 4 6PLY (GAUZE/BANDAGES/DRESSINGS)
CANISTER WOUND CARE 500ML ATS (WOUND CARE) IMPLANT
COVER SURGICAL LIGHT HANDLE (MISCELLANEOUS) ×4 IMPLANT
DRAPE DERMATAC (DRAPES) IMPLANT
DRAPE U-SHAPE 47X51 STRL (DRAPES) ×4 IMPLANT
DRESSING PEEL AND PLC PRVNA 13 (GAUZE/BANDAGES/DRESSINGS) IMPLANT
DRSG ADAPTIC 3X8 NADH LF (GAUZE/BANDAGES/DRESSINGS) ×2 IMPLANT
DRSG PEEL AND PLACE PREVENA 13 (GAUZE/BANDAGES/DRESSINGS) ×1
DURAPREP 26ML APPLICATOR (WOUND CARE) ×2 IMPLANT
ELECT REM PT RETURN 9FT ADLT (ELECTROSURGICAL) ×1
ELECTRODE REM PT RTRN 9FT ADLT (ELECTROSURGICAL) ×2 IMPLANT
GAUZE PAD ABD 8X10 STRL (GAUZE/BANDAGES/DRESSINGS) ×4 IMPLANT
GAUZE SPONGE 4X4 12PLY STRL (GAUZE/BANDAGES/DRESSINGS) ×2 IMPLANT
GLOVE BIOGEL PI IND STRL 9 (GLOVE) ×2 IMPLANT
GLOVE SURG ORTHO 9.0 STRL STRW (GLOVE) ×2 IMPLANT
GOWN STRL REUS W/ TWL XL LVL3 (GOWN DISPOSABLE) ×4 IMPLANT
GOWN STRL REUS W/TWL XL LVL3 (GOWN DISPOSABLE) ×2
GRAFT SKIN WND MICRO 38 (Tissue) IMPLANT
KIT BASIN OR (CUSTOM PROCEDURE TRAY) ×2 IMPLANT
KIT DRSG PREVENA PLUS 7DAY 125 (MISCELLANEOUS) IMPLANT
KIT TURNOVER KIT B (KITS) ×2 IMPLANT
NS IRRIG 1000ML POUR BTL (IV SOLUTION) ×2 IMPLANT
PACK ORTHO EXTREMITY (CUSTOM PROCEDURE TRAY) ×2 IMPLANT
PAD ARMBOARD 7.5X6 YLW CONV (MISCELLANEOUS) ×4 IMPLANT
STOCKINETTE IMPERVIOUS LG (DRAPES) IMPLANT
SUT ETHILON 2 0 PSLX (SUTURE) ×2 IMPLANT
TOWEL GREEN STERILE (TOWEL DISPOSABLE) ×2 IMPLANT
TUBE CONNECTING 12X1/4 (SUCTIONS) ×2 IMPLANT
YANKAUER SUCT BULB TIP NO VENT (SUCTIONS) ×2 IMPLANT

## 2022-09-08 NOTE — Interval H&P Note (Signed)
History and Physical Interval Note:  09/08/2022 6:52 AM  Jesse Pollock Sr.  has presented today for surgery, with the diagnosis of Osteomyelitis Right 5th Toe.  The various methods of treatment have been discussed with the patient and family. After consideration of risks, benefits and other options for treatment, the patient has consented to  Procedure(s): RIGHT FOOT 5TH RAY AMPUTATION (Right) as a surgical intervention.  The patient's history has been reviewed, patient examined, no change in status, stable for surgery.  I have reviewed the patient's chart and labs.  Questions were answered to the patient's satisfaction.     [name]

## 2022-09-08 NOTE — Progress Notes (Signed)
Brief ID Note:  Patient status post 5th ray amputation today with Dr Lajoyce Corners. Patient was off antibiotics leading up to his surgery.  Will resume antibiotics now with daptomycin and ceftriaxone.  Plan for oral antibiotics at discharge for mop up therapy in setting of OM with minor amputation and PAD.     Vedia Coffer for Infectious Disease [facility] 09/08/2022, 1:05 PM

## 2022-09-08 NOTE — Progress Notes (Signed)
ANTICOAGULATION CONSULT NOTE - Initial Consult  Pharmacy Consult for heparin  Indication: atrial fibrillation  Allergies  Allergen Reactions   Codeine Swelling    Patient Measurements: Height: 5\' 7"  (170.2 cm) Weight: 82.1 kg (181 lb) IBW/kg (Calculated) : 66.1 HEPARIN DW (KG): 82.1   Vital Signs: Temp: 98.4 F (36.9 C) (05/22 1145) Temp Source: Oral (05/22 0738) BP: 112/44 (05/22 1145) Pulse Rate: 52 (05/22 1145)  Labs: Recent Labs    09/06/22 0218 09/07/22 0147 09/08/22 0231  HGB 8.1* 7.8* 7.6*  HCT 25.2* 24.2* 23.9*  PLT 284 291 301  HEPARINUNFRC  --  0.59 0.89*  CREATININE 1.08 1.11 1.14     Estimated Creatinine Clearance: 42.4 mL/min (by C-G formula based on SCr of 1.14 mg/dL).   Medical History: Past Medical History:  Diagnosis Date   Hypertension    Kidney calculus 2014;2013   Pneumonia    Prostate cancer (HCC)    Shortness of breath     Medications:  Medications Prior to Admission  Medication Sig Dispense Refill Last Dose   alendronate (FOSAMAX) 70 MG tablet TAKE 1 TABLET EVERY WEEK IN THE MORNING 30 MIN BEFORE EATING WITH AN 8OZ GLASS OF WATER (SIT UP 30 MIN) 4 tablet 11 Past Week   aspirin EC 81 MG tablet Take 81 mg by mouth daily.   08/24/2022   benazepril (LOTENSIN) 10 MG tablet Take 10 mg by mouth daily.   08/24/2022   fish oil-omega-3 fatty acids 1000 MG capsule Take 1 g by mouth daily.   08/24/2022   hydrochlorothiazide (HYDRODIURIL) 25 MG tablet Take 25 mg by mouth daily.   08/24/2022   tamsulosin (FLOMAX) 0.4 MG CAPS capsule Take 0.4 mg by mouth daily after supper.   Past Week   furosemide (LASIX) 20 MG tablet Take 20-40 mg by mouth daily as needed for fluid.  (Patient not taking: Reported on 08/25/2022)   Not Taking   Scheduled:   vitamin C  1,000 mg Oral Daily   aspirin EC  81 mg Oral Daily   atorvastatin  40 mg Oral Daily   clopidogrel  75 mg Oral Q breakfast   cyanocobalamin  1,000 mcg Intramuscular Daily   dextromethorphan-guaiFENesin  1  tablet Oral BID   [START ON 09/09/2022] docusate sodium  100 mg Oral Daily   furosemide  20 mg Intravenous Daily   leptospermum manuka honey  1 Application Topical Daily   multivitamin with minerals  1 tablet Oral Daily   nutrition supplement (JUVEN)  1 packet Oral BID BM   omega-3 acid ethyl esters  1 g Oral Daily   pantoprazole  40 mg Oral Daily   senna-docusate  1 tablet Oral BID   sodium chloride flush  3 mL Intravenous Q12H   tamsulosin  0.4 mg Oral QPC supper   zinc sulfate  220 mg Oral Daily    Assessment: 89 yo male with new afib to start heparin. He is noted with PAD s/p balloon angioplasty on plavix and asa  He is s/p amputation of his toe. Ok to resume heparin per Dr Lajoyce Corners for his AF.  Goal of Therapy:  Heparin level 0.3-0.7 units/ml Monitor platelets by anticoagulation protocol: Yes   Plan:  Resume heparin at 1000 units/hr at 4pm -Heparin level in 8 hours and daily wth CBC daily  Ulyses Southward, PharmD, [place], AAHIVP, CPP Infectious Disease Pharmacist 09/08/2022 12:41 PM

## 2022-09-08 NOTE — Anesthesia Procedure Notes (Signed)
Anesthesia Regional Block: Popliteal block   Pre-Anesthetic Checklist: , timeout performed,  Correct Patient, Correct Site, Correct Laterality,  Correct Procedure, Correct Position, site marked,  Risks and benefits discussed,  Surgical consent,  Pre-op evaluation,  At surgeon's request and post-op pain management  Laterality: Right  Prep: chloraprep       Needles:  Injection technique: Single-shot  Needle Type: Echogenic Stimulator Needle          Additional Needles:   Procedures:,,,, ultrasound used (permanent image in chart),,    Narrative:  Start time: 09/08/2022 9:07 AM End time: 09/08/2022 10:07 AM Injection made incrementally with aspirations every 5 mL.  Performed by: Personally  Anesthesiologist: Heather Roberts, MD  Additional Notes: A functioning IV was confirmed and monitors were applied.  Sterile prep and drape, hand hygiene and sterile gloves were used.  Negative aspiration and test dose prior to incremental administration of local anesthetic. The patient tolerated the procedure well.Ultrasound  guidance: relevant anatomy identified, needle position confirmed, local anesthetic spread visualized around nerve(s), vascular puncture avoided.  Image printed for medical record.

## 2022-09-08 NOTE — Anesthesia Preprocedure Evaluation (Addendum)
Anesthesia Evaluation  Patient identified by MRN, date of birth, ID band Patient awake    Reviewed: Allergy & Precautions, NPO status , Patient's Chart, lab work & pertinent test results  History of Anesthesia Complications Negative for: history of anesthetic complications  Airway Mallampati: II  TM Distance: >3 FB Neck ROM: Full    Dental no notable dental hx. (+) Dental Advisory Given   Pulmonary neg pulmonary ROS   Pulmonary exam normal        Cardiovascular hypertension, Pt. on medications + Peripheral Vascular Disease  Normal cardiovascular exam+ dysrhythmias Atrial Fibrillation   TTE 08/26/22: EF 65-70%, mild LVH, mild LAE, mild MR     Neuro/Psych negative neurological ROS     GI/Hepatic negative GI ROS, Neg liver ROS,,,  Endo/Other  negative endocrine ROS    Renal/GU negative Renal ROS     Musculoskeletal Osteomyelitis Right 5th Toe   Abdominal   Peds  Hematology negative hematology ROS (+)   Anesthesia Other Findings Day of surgery medications reviewed with patient.  Reproductive/Obstetrics                             Anesthesia Physical Anesthesia Plan  ASA: 3  Anesthesia Plan: Regional and MAC   Post-op Pain Management: Tylenol PO (pre-op)*   Induction:   PONV Risk Score and Plan: 1 and Treatment may vary due to age or medical condition, Propofol infusion and Ondansetron  Airway Management Planned: Natural Airway and Simple Face Mask  Additional Equipment: None  Intra-op Plan:   Post-operative Plan:   Informed Consent: I have reviewed the patients History and Physical, chart, labs and discussed the procedure including the risks, benefits and alternatives for the proposed anesthesia with the patient or authorized representative who has indicated his/her understanding and acceptance.     Dental advisory given  Plan Discussed with: CRNA and  Anesthesiologist  Anesthesia Plan Comments:         Anesthesia Quick Evaluation

## 2022-09-08 NOTE — Progress Notes (Signed)
Orthopedic Tech Progress Note Patient Details:  [name] Sr. 07/12/1928 [id number]  Ortho Devices Type of Ortho Device: Postop shoe/boot Ortho Device/Splint Location: RLE Ortho Device/Splint Interventions: Application   Post Interventions Patient Tolerated: Well Instructions Provided: Adjustment of device  Nicholous Girgenti E Clairissa Valvano 09/08/2022, 1:03 PM

## 2022-09-08 NOTE — Op Note (Signed)
08/24/2022 - 09/08/2022  11:07 AM  PATIENT:  Jesse Leep Leiter Sr.    PRE-OPERATIVE DIAGNOSIS:  Osteomyelitis Right 5th Toe  POST-OPERATIVE DIAGNOSIS:  Same  PROCEDURE:  RIGHT FOOT FIFTH RAY AMPUTATION Application Kerecis micro graft 38 cm. Local tissue rearrangement for wound closure 4 x 10 cm. Application of Prevena 13 cm wound VAC.  SURGEON:  [name], MD  PHYSICIAN ASSISTANT:None ANESTHESIA:   General  PREOPERATIVE INDICATIONS:  Jesse Pollock Sr. is a  89 y.o. male with a diagnosis of Osteomyelitis Right 5th Toe who failed conservative measures and elected for surgical management.    The risks benefits and alternatives were discussed with the patient preoperatively including but not limited to the risks of infection, bleeding, nerve injury, cardiopulmonary complications, the need for revision surgery, among others, and the patient was willing to proceed.  OPERATIVE IMPLANTS:   Implant Name Type Inv. Item Serial No. Manufacturer Lot No. LRB No. Used Action  GRAFT SKIN WND MICRO 38 - [id number] Tissue GRAFT SKIN WND MICRO 38  [employer] [id number] Right 1 Implanted    @ENCIMAGES @  OPERATIVE FINDINGS: Patient had clear tissue margins with minimal petechial bleeding.  OPERATIVE PROCEDURE: Patient was brought the operating room after undergoing a regional anesthesia.  The right lower extremity was then prepped using DuraPrep draped into a sterile field a timeout was called.  A racquet incision was made around the ulcerative tissue in the fifth ray.  The fifth ray was resected through the base.  This left the wound that was 4 x 10 cm.  Electrocautery was used for hemostasis the wound was irrigated with normal saline.  The wound was filled with 38 cm of Kerecis micro graft to cover a surface area 4 x 10 cm.  The tissue margins were undermined to allow for local tissue rearrangement for wound closure with 2-0 nylon 4 x 10 cm.  A Prevena 13 cm wound VAC was applied this had a good  suction fit patient was taken the PACU in stable condition.   DISCHARGE PLANNING:  Antibiotic duration: 24 hours of antibiotics  Weightbearing: Touchdown weightbearing on the right  Pain medication: Opioid pathway  Dressing care/ Wound VAC: Continue wound VAC at discharge  Ambulatory devices: Walker  Discharge to: Discharge planning based on therapy recommendations.  Follow-up: In the office 1 week post operative.

## 2022-09-08 NOTE — Progress Notes (Signed)
PROGRESS NOTE [name]  WUJ:[id number] DOB: Oct 16, 1928 DOA: 08/24/2022 PCP: Elfredia Nevins, MD  Brief Narrative/Hospital Course: Jesse Pollock Sr. is a 89 y.o. male with a history of hypertension, BPH, prostate cancer presented secondary to generalized weakness, difficulty ambulation a few weeks on 08/25/22  Seen in the ED, found to have anemia, lactic acidosis underwent CT angio chest, CT abdomen pelvis, x-ray left foot right foot chest x-ray> and admitted for sepsis/fever, lactic acidosis with right foot ulcer, bronchitis Workup revealed gram-positive bacteremia, Aerococcus//Actinotignum schaalii bacteremia right foot osteomyelitis of fifth metatarsal head.  ABI noted poor circulation/critical ischemia with tissue loss at the toes- underwent angiogram  5/16:with recanalization/balloon angioplasty of the tibioperoneal trunk and resolution of flow-limiting stenosis within the posterior tibial artery by vascular Dr Zella Ball. Dr. Lajoyce Corners has been consulted> planning for right foot fifth ray amputation. Seen by infectious disease, completed antibiotics and monitoring off antibiotic Seen by cardiology for new onset A-fib given his high CHA2DS2-VASc score placed on heparin drip 5/20    Subjective: Seen examined this morning.  He is resting comfortably.   He is waiting for his amputation this morning  No new complaints Overnight afebrile BP stable labs with creatinine 1.1 hemoglobin 7.6 this morning  Assessment and Plan: Principal Problem:   Polymicrobial bacterial infection Active Problems:   Fever of undetermined origin   Lactic acidosis   Generalized weakness   Bronchitis   Bilateral pleural effusion   Right foot ulcer (HCC)   Essential hypertension   BPH (benign prostatic hyperplasia)   Paroxysmal atrial fibrillation (HCC)   Osteomyelitis of fifth toe of right foot (HCC)   Bacteremia   Acute cystitis without hematuria   PVD (peripheral vascular disease) (HCC)   Sepsis POA w/  Gram-positive bacteremia/Aerococcus//Actinotignum schaalii bacteremia Completed antibiotics ID following monitoring off antibiotics.  Seen by vascular and podiatry see below.  Awaiting for amputation 5/22  Right foot osteomyelitis of fifth metatarsal head: Critical critical ischemia with tissue loss at the toes: S/p recanalization/balloon angioplasty of the tibioperoneal trunk and resolution of flow-limiting stenosis within the posterior tibial artery by vascular Dr Zella Ball.Dr. Lajoyce Corners planning for right foot fifth ray amputation 5/22 ID requesting culture from surgical site. Continue pain control, continue aspirin, Plavix Lipitor.  A-fib with slow ventricular response rate:Echo with normal EF.Appears new onset- w/ high chads2vasc score high of 5, appreciate input from cardiology, heparin drip started 5/20 will eventually need to switch to Eliquis and adjust antiplatelets- not planning triple therapy.Not needing rate controlling agent.  Monitor, in telemetry.  Peripheral neuropathy continue B12 supplement. Debility deconditioning weakness continue PT OT, recommending skilled nursing facility upon discharge Prostate cancer followed by urology continue Flomax sclerotic focus noted on left second rib and nonspecific-May need to discuss with IR prior to discharge or close follow up w/ urology. He follows Dr. Retta Diones.  Hyponatremia:Mild,monitor.  Anemia of chronic disease and b12 deficiency: anemia panel w/ iron  saturation 60 normal I folate, B12 was previously low on 5/7 < 50 and on B12 supplement IM daily.  Monitor and transfuse if further drop or < 7 gm Recent Labs  Lab 09/02/22 0039 09/06/22 0218 09/07/22 0147 09/08/22 0231  HGB 9.1* 8.1* 7.8* 7.6*  HCT 28.6* 25.2* 24.2* 23.9*     Pressure injury of the left  heel, right buttock POA  DVT prophylaxis: SCDs Start: 08/25/22 0026 Code Status:   Code Status: Full Code Family Communication: plan of care discussed with patient at  bedside. Patient status is:  inpatient  because of critical limb ischemia Level of care: Progressive Cardiac   Dispo: The patient is from: home, lives alone has family nearby, and daughter now lives with him            Anticipated disposition: TBD in Objective: Vitals last 24 hrs: Vitals:   09/07/22 1927 09/07/22 2333 09/08/22 0353 09/08/22 0738  BP: (!) 128/47 (!) 116/48 (!) 115/45 (!) 136/55  Pulse:    91  Resp: 20 16 20 16   Temp: 98.1 F (36.7 C) 98 F (36.7 C) 98.3 F (36.8 C) 98 F (36.7 C)  TempSrc: Oral Axillary Oral Oral  SpO2: 100% 100% 100% 100%  Weight:      Height:       Weight change:   Physical Examination: General exam: AA0, pleasant weak,older appearing HEENT:Oral mucosa moist, Ear/Nose WNL grossly, dentition normal. Respiratory system: bilaterally CLEARBS, no use of accessory muscle Cardiovascular system: S1 & S2 +, M0 Gastrointestinal system: Abdomen soft, NT,ND,BS+ Nervous System:Alert, awake, moving extremities and grossly nonfocal Extremities: LE ankle edema NEG, lower extremities warm Skin: No rashes,no icterus. MSK: Normal muscle bulk,tone, power   Medications reviewed:  Scheduled Meds:  acetaminophen  1,000 mg Oral Once   [MAR Hold] aspirin EC  81 mg Oral Daily   [MAR Hold] atorvastatin  40 mg Oral Daily   chlorhexidine  15 mL Mouth/Throat Once   Or   mouth rinse  15 mL Mouth Rinse Once   chlorhexidine       [MAR Hold] clopidogrel  75 mg Oral Q breakfast   [MAR Hold] cyanocobalamin  1,000 mcg Intramuscular Daily   [MAR Hold] dextromethorphan-guaiFENesin  1 tablet Oral BID   [MAR Hold] furosemide  20 mg Intravenous Daily   [MAR Hold] leptospermum manuka honey  1 Application Topical Daily   [MAR Hold] multivitamin with minerals  1 tablet Oral Daily   [MAR Hold] omega-3 acid ethyl esters  1 g Oral Daily   [MAR Hold] pantoprazole  40 mg Oral Daily   povidone-iodine  2 Application Topical Once   [MAR Hold] senna-docusate  1 tablet Oral BID    [MAR Hold] sodium chloride flush  3 mL Intravenous Q12H   [MAR Hold] tamsulosin  0.4 mg Oral QPC supper   Continuous Infusions:  [MAR Hold] sodium chloride      ceFAZolin (ANCEF) IV     heparin 1,000 Units/hr (09/07/22 1653)   lactated ringers     Diet Order             Diet NPO time specified  Diet effective ____                  Intake/Output Summary (Last 24 hours) at 09/08/2022 0948 Last data filed at 09/08/2022 0354 Gross per 24 hour  Intake 361.12 ml  Output 1451 ml  Net -1089.88 ml    Net IO Since Admission: -2,456.34 mL [09/08/22 0948]  Wt Readings from Last 3 Encounters:  08/24/22 82.1 kg  07/06/22 97.5 kg  05/20/20 97.5 kg     Unresulted Labs (From admission, onward)     Start     Ordered   09/08/22 0500  Heparin level (unfractionated)  Daily,   R     Question:  Specimen collection method  Answer:  Lab=Lab collect   09/06/22 1429   09/07/22 0500  Basic metabolic panel  Daily,   R     Question:  Specimen collection method  Answer:  Lab=Lab collect   09/06/22  1041   09/07/22 0500  CBC  Daily,   R     Question:  Specimen collection method  Answer:  Lab=Lab collect   09/06/22 1041          Data Reviewed: I have personally reviewed following labs and imaging studies CBC: Recent Labs  Lab 09/02/22 0039 09/06/22 0218 09/07/22 0147 09/08/22 0231  WBC 6.5 7.4 7.6 8.9  NEUTROABS  --  3.7  --   --   HGB 9.1* 8.1* 7.8* 7.6*  HCT 28.6* 25.2* 24.2* 23.9*  MCV 104.0* 104.6* 103.9* 103.9*  PLT 274 284 291 301    Basic Metabolic Panel: Recent Labs  Lab 09/02/22 0039 09/06/22 0218 09/07/22 0147 09/08/22 0231  NA 133* 132* 134* 131*  K 3.8 3.7 3.6 4.1  CL 98 99 102 98  CO2 25 26 25 26   GLUCOSE 104* 106* 89 126*  BUN 22 22 25* 27*  CREATININE 1.02 1.08 1.11 1.14  CALCIUM 8.3* 8.0* 8.0* 8.0*  MG  --  2.1  --   --   PHOS  --  3.7  --   --     Recent Labs  Lab 09/06/22 0218  ALBUMIN 2.5*   No results for input(s): "PROCALCITON",  "LATICACIDVEN" in the last 168 hours.  Recent Results (from the past 240 hour(s))  Surgical PCR screen     Status: None   Collection Time: 09/02/22  1:21 AM   Specimen: Nasal Mucosa; Nasal Swab  Result Value Ref Range Status   MRSA, PCR NEGATIVE NEGATIVE Final   Staphylococcus aureus NEGATIVE NEGATIVE Final    Comment: (NOTE) The Xpert SA Assay (FDA approved for NASAL specimens in patients 81 years of age and older), is one component of a comprehensive surveillance program. It is not intended to diagnose infection nor to guide or monitor treatment. Performed at [facility], [address], [place], [place] [address]     Antimicrobials: Anti-infectives (From admission, onward)    Start     Dose/Rate Route Frequency Ordered Stop   09/08/22 0600  ceFAZolin (ANCEF) IVPB 2g/100 mL premix        2 g 200 mL/hr over 30 Minutes Intravenous On call to O.R. 09/08/22 0434 09/09/22 0559   08/31/22 1700  Ampicillin-Sulbactam (UNASYN) 3 g in sodium chloride 0.9 % 100 mL IVPB  Status:  Discontinued        3 g 200 mL/hr over 30 Minutes Intravenous Every 6 hours 08/31/22 1028 09/01/22 0911   08/30/22 2200  cefTRIAXone (ROCEPHIN) 2 g in sodium chloride 0.9 % 100 mL IVPB  Status:  Discontinued        2 g 200 mL/hr over 30 Minutes Intravenous Every 24 hours 08/30/22 1143 08/31/22 1028   08/30/22 1615  amoxicillin (AMOXIL) capsule 500 mg        500 mg Oral  Once 08/30/22 1522 08/30/22 1730   08/30/22 1415  amoxicillin (AMOXIL) capsule 500 mg  Status:  Discontinued        500 mg Oral  Once 08/30/22 1318 08/30/22 1522   08/25/22 2200  vancomycin (VANCOCIN) IVPB 1000 mg/200 mL premix  Status:  Discontinued        1,000 mg 200 mL/hr over 60 Minutes Intravenous Every 24 hours 08/24/22 2126 08/30/22 1143   08/25/22 1800  metroNIDAZOLE (FLAGYL) tablet 500 mg  Status:  Discontinued        500 mg Oral Every 12 hours 08/25/22 1709 08/31/22 1028   08/24/22  2130  ceFEPIme (MAXIPIME) 2 g in sodium  chloride 0.9 % 100 mL IVPB  Status:  Discontinued        2 g 200 mL/hr over 30 Minutes Intravenous  Once 08/24/22 2120 08/24/22 2126   08/24/22 2130  metroNIDAZOLE (FLAGYL) IVPB 500 mg        500 mg 100 mL/hr over 60 Minutes Intravenous  Once 08/24/22 2120 08/24/22 2312   08/24/22 2130  vancomycin (VANCOCIN) IVPB 1000 mg/200 mL premix  Status:  Discontinued        1,000 mg 200 mL/hr over 60 Minutes Intravenous  Once 08/24/22 2120 08/24/22 2126   08/24/22 2130  ceFEPIme (MAXIPIME) 2 g in sodium chloride 0.9 % 100 mL IVPB  Status:  Discontinued        2 g 200 mL/hr over 30 Minutes Intravenous Every 12 hours 08/24/22 2126 08/26/22 1426   08/24/22 2130  vancomycin (VANCOREADY) IVPB 1250 mg/250 mL        1,250 mg 166.7 mL/hr over 90 Minutes Intravenous  Once 08/24/22 2126 08/25/22 0029      Culture/Microbiology    Component Value Date/Time   SDES BLOOD RIGHT HAND 08/26/2022 1150   SDES RIGHT ANTECUBITAL 08/26/2022 1150   SPECREQUEST  08/26/2022 1150    BOTTLES DRAWN AEROBIC AND ANAEROBIC Blood Culture results may not be optimal due to an excessive volume of blood received in culture bottles   SPECREQUEST  08/26/2022 1150    BOTTLES DRAWN AEROBIC AND ANAEROBIC Blood Culture adequate volume   CULT  08/26/2022 1150    NO GROWTH 5 DAYS Performed at [facility], [address], [place], [place] [address]    CULT  08/26/2022 1150    NO GROWTH 5 DAYS Performed at [facility], [address] [place], [place] [address]    REPTSTATUS 08/31/2022 FINAL 08/26/2022 1150   REPTSTATUS 08/31/2022 FINAL 08/26/2022 1150  Radiology Studies: No results found.   LOS: 14 days   Lanae Boast, MD [facility]  09/08/2022, 9:48 AM

## 2022-09-08 NOTE — Transfer of Care (Signed)
Immediate Anesthesia Transfer of Care Note  Patient: Jesse Pollock Sr.  Procedure(s) Performed: RIGHT FOOT FIFTH RAY AMPUTATION (Right: Foot)  Patient Location: PACU  Anesthesia Type:MAC combined with regional for post-op pain  Level of Consciousness: awake, alert , and patient cooperative  Airway & Oxygen Therapy: Patient Spontanous Breathing  Post-op Assessment: Report given to RN and Post -op Vital signs reviewed and stable  Post vital signs: Reviewed and stable  Last Vitals:  Vitals Value Taken Time  BP 118/45 09/08/22 1119  Temp    Pulse 59 09/08/22 1121  Resp 18 09/08/22 1121  SpO2 99 % 09/08/22 1121  Vitals shown include unvalidated device data.  Last Pain:  Vitals:   09/08/22 1010  TempSrc:   PainSc: 0-No pain      Patients Stated Pain Goal: 0 (09/08/22 0958)  Complications: No notable events documented.

## 2022-09-08 NOTE — Anesthesia Postprocedure Evaluation (Signed)
Anesthesia Post Note  Patient: Jesse Pollock Sr.  Procedure(s) Performed: RIGHT FOOT FIFTH RAY AMPUTATION (Right: Foot)     Patient location during evaluation: PACU Anesthesia Type: Regional Level of consciousness: awake and alert Pain management: pain level controlled Vital Signs Assessment: post-procedure vital signs reviewed and stable Respiratory status: spontaneous breathing and respiratory function stable Cardiovascular status: stable Postop Assessment: no apparent nausea or vomiting Anesthetic complications: no   No notable events documented.  Last Vitals:  Vitals:   09/08/22 1130 09/08/22 1145  BP: (!) 106/47 (!) 112/44  Pulse: (!) 49 (!) 52  Resp: 15 16  Temp:  36.9 C  SpO2: 98% 99%    Last Pain:  Vitals:   09/08/22 1145  TempSrc:   PainSc: 0-No pain                 [name] DANIEL

## 2022-09-09 ENCOUNTER — Encounter (HOSPITAL_COMMUNITY): Payer: Self-pay | Admitting: Orthopedic Surgery

## 2022-09-09 LAB — BASIC METABOLIC PANEL
Anion gap: 10 (ref 5–15)
BUN: 28 mg/dL — ABNORMAL HIGH (ref 8–23)
CO2: 24 mmol/L (ref 22–32)
Calcium: 8.3 mg/dL — ABNORMAL LOW (ref 8.9–10.3)
Chloride: 99 mmol/L (ref 98–111)
Creatinine, Ser: 1.07 mg/dL (ref 0.61–1.24)
GFR, Estimated: >60 mL/min (ref >60)
Glucose, Bld: 127 mg/dL — ABNORMAL HIGH (ref 70–99)
Potassium: 4.3 mmol/L (ref 3.5–5.1)
Sodium: 133 mmol/L — ABNORMAL LOW (ref 135–145)

## 2022-09-09 LAB — CBC
HCT: 22.5 % — ABNORMAL LOW (ref 39.0–52.0)
Hemoglobin: 7.4 g/dL — ABNORMAL LOW (ref 13.0–17.0)
MCH: 33.6 pg (ref 26.0–34.0)
MCHC: 32.9 g/dL (ref 30.0–36.0)
MCV: 102.3 fL — ABNORMAL HIGH (ref 80.0–100.0)
Platelets: 298 10*3/uL (ref 150–400)
RBC: 2.2 MIL/uL — ABNORMAL LOW (ref 4.22–5.81)
RDW: 15.5 % (ref 11.5–15.5)
WBC: 5.3 10*3/uL (ref 4.0–10.5)
nRBC: 0 % (ref 0.0–0.2)

## 2022-09-09 LAB — HEPARIN LEVEL (UNFRACTIONATED)
Heparin Unfractionated: 0.39 IU/mL (ref 0.30–0.70)
Heparin Unfractionated: 0.56 IU/mL (ref 0.30–0.70)

## 2022-09-09 LAB — CK: Total CK: 69 U/L (ref 49–397)

## 2022-09-09 MED ORDER — DOXYCYCLINE HYCLATE 100 MG PO TABS
100.0000 mg | ORAL_TABLET | Freq: Two times a day (BID) | ORAL | Status: DC
  Administered 2022-09-09 – 2022-09-10 (×3): 100 mg via ORAL
  Filled 2022-09-09 (×3): qty 1

## 2022-09-09 MED ORDER — AMOXICILLIN-POT CLAVULANATE 875-125 MG PO TABS
1.0000 | ORAL_TABLET | Freq: Two times a day (BID) | ORAL | Status: DC
  Administered 2022-09-09 – 2022-09-10 (×3): 1 via ORAL
  Filled 2022-09-09 (×3): qty 1

## 2022-09-09 MED ORDER — APIXABAN 5 MG PO TABS
5.0000 mg | ORAL_TABLET | Freq: Two times a day (BID) | ORAL | Status: DC
  Administered 2022-09-09 – 2022-09-10 (×3): 5 mg via ORAL
  Filled 2022-09-09 (×3): qty 1

## 2022-09-09 NOTE — Progress Notes (Signed)
Inpatient Rehab Admissions Coordinator:   Consult received and chart reviewed.  Following for updated postop therapy recs.    Estill Dooms, PT, DPT Admissions Coordinator [phone] 09/09/22  12:12 PM

## 2022-09-09 NOTE — Progress Notes (Signed)
Physical Therapy Treatment Patient Details Name: [name] Sr. MRN: [id number] DOB: 1929/03/19 Today's Date: 09/09/2022   History of Present Illness 89 y.o. male [facility] to [facility] 08/24/2022 due to generalized progressive weakness. 5/8 found to be febrile with leukocytosis, started on antibiotics for sepsis with unclear source. 5/9 Transferred to [facility] hospital found to have gram-positive cocci bacteremia with possible R foot osteomyelitis. Patient underwent balloon angioplasty of the posterior tibial artery by the vascular surgery team 5/16. Underwent R 5th ray amputation onf 5/22.PMH includes hypertension, BPH, prostate cancer.    PT Comments    The pt was seen for re-assessment after amputation of R 5th ray. He continues to require significant assist to complete bed mobility, and was limited to sit-stand transfers this session due to inability to maintain TDWB RLE while attempting to pivot or move with his LLE. The pt also presents with strong posterior lean in standing requiring modA of 2 to maintain static standing and he was unable to tolerate for >5-10 seconds at a time. Recommendations remain appropriate, goals downgraded due to slow progress and increased difficulty with new weightbearing restrictions for RLE.     Recommendations for follow up therapy are one component of a multi-disciplinary discharge planning process, led by the attending physician.  Recommendations may be updated based on patient status, additional functional criteria and insurance authorization.  Follow Up Recommendations  Can patient physically be transported by private vehicle: No    Assistance Recommended at Discharge Frequent or constant Supervision/Assistance  Patient can return home with the following Two people to help with walking and/or transfers;A lot of help with bathing/dressing/bathroom;Assistance with cooking/housework;Assist for transportation;Help with stairs or ramp for entrance   Equipment  Recommendations  None recommended by PT;Other (comment) (defer to post acute)    Recommendations for Other Services       Precautions / Restrictions Precautions Precautions: Fall Required Braces or Orthoses: Other Brace Other Brace: R post op shoe Restrictions Weight Bearing Restrictions: Yes RLE Weight Bearing: Touchdown weight bearing Other Position/Activity Restrictions: HOH     Mobility  Bed Mobility Overal bed mobility: Needs Assistance Bed Mobility: Supine to Sit     Supine to sit: +2 for physical assistance, Mod assist, HOB elevated Sit to supine: +2 for physical assistance, Mod assist   General bed mobility comments: cues for sequencing, assist for LEs over EOB, especially R LE, to raise trunk and for hips to EOB with bed pad, guided trunk and assisted LEs back into bed    Transfers Overall transfer level: Needs assistance Equipment used: Rolling walker (2 wheels) Transfers: Sit to/from Stand Sit to Stand: +2 physical assistance, Max assist, Mod assist, From elevated surface           General transfer comment: cues for hand placement, assist to rise and steady, requiring more assist second trial with increased posterior lean, pt able to maintain minimal weigh on R LE in static standing but uinable to progress to stepping without increasing wt through RLE    Ambulation/Gait               General Gait Details: pt unable     Balance Overall balance assessment: Needs assistance   Sitting balance-Leahy Scale: Fair Sitting balance - Comments: posterior bias, but no LOB Postural control: Posterior lean Standing balance support: Bilateral upper extremity supported, During functional activity, Reliant on assistive device for balance Standing balance-Leahy Scale: Poor Standing balance comment: dependent on BUE support and modA of 2 to maintain  upright, posterior lean                            Cognition Arousal/Alertness: Awake/alert Behavior  During Therapy: WFL for tasks assessed/performed Overall Cognitive Status: Within Functional Limits for tasks assessed                                 General Comments: difficult to assess due to [facility] cousin in room and reports his memory is good        Exercises      General Comments General comments (skin integrity, edema, etc.): VSS on RA, pt is SOB after exertion but SpO2 >95%      Pertinent Vitals/Pain Pain Assessment Pain Assessment: No/denies pain Pain Intervention(s): Monitored during session     PT Goals (current goals can now be found in the care plan section) Acute Rehab PT Goals Patient Stated Goal: return home after rehab PT Goal Formulation: With patient Time For Goal Achievement: 09/23/22 Potential to Achieve Goals: Good Progress towards PT goals: Goals downgraded-see care plan    Frequency    Min 3X/week      PT Plan Current plan remains appropriate    Co-evaluation PT/OT/SLP Co-Evaluation/Treatment: Yes Reason for Co-Treatment: For patient/therapist safety;To address functional/ADL transfers PT goals addressed during session: Mobility/safety with mobility;Balance;Proper use of DME;Strengthening/ROM OT goals addressed during session: Strengthening/ROM      AM-PAC PT "6 Clicks" Mobility   Outcome Measure  Help needed turning from your back to your side while in a flat bed without using bedrails?: A Lot Help needed moving from lying on your back to sitting on the side of a flat bed without using bedrails?: A Lot Help needed moving to and from a bed to a chair (including a wheelchair)?: Total Help needed standing up from a chair using your arms (e.g., wheelchair or bedside chair)?: Total Help needed to walk in hospital room?: Total Help needed climbing 3-5 steps with a railing? : Total 6 Click Score: 8    End of Session Equipment Utilized During Treatment: Gait belt Activity Tolerance: Patient tolerated treatment well Patient  left: in bed;with call bell/phone within reach;with bed alarm set;with family/visitor present Nurse Communication: Mobility status PT Visit Diagnosis: Unsteadiness on feet (R26.81);Other abnormalities of gait and mobility (R26.89);Muscle weakness (generalized) (M62.81)     Time: 1610-9604 PT Time Calculation (min) (ACUTE ONLY): 33 min  Charges:  $Therapeutic Activity: 8-22 mins                     Vickki Muff, PT, DPT   Acute Rehabilitation Department Office [phone] Secure Chat Communication Preferred   Ronnie Derby 09/09/2022, 3:54 PM

## 2022-09-09 NOTE — Progress Notes (Signed)
[facility]  Date of Admission:  08/24/2022      Total days of antibiotics on hold  Unasyn 5/14 - 5/15  Ceftriaxone           ASSESSMENT: Jesse Pollock Sr. is a 89 y.o. male admitted from home for evaluation of generalized weakness, found to have polymicrobial bacteremia due to Aerococcus urinae and Actinotignum schaalii. Found to also have critical limb ischemia with chronic ulceration of the right 5th toe.      Bacteremia, Aerococcus urinae and Actinotignum schaalii -  -Most likely this was translocated from bladder source given organism growing in urine as well.  -Treated / resolved.      Right Foot Ulceration with underlying OM -  -MRI with read confirming suspicion for osteomyelitis. Now s/p 5th toe amputation with Dr. Lajoyce Corners --> clean margins with healthy bleeding tissue noted -PAD with ABI > 0.7. Now s/p angiography with successful revascularization to right leg.  -would give him 2 weeks of empiric PO antibiotic therapy with doxycycline + augmentin to mop up any residual infection     H/O Remote Penicillin Allergy -  -> 20 years ago with non-severe reaction described. Tolerated amox challenge 5/13 well. Did well with Unasyn w/o concern for any effects.  -Allergy removed    ID will sign off - please call back with any questions/concerns or if we can be of further assistance.     PLAN: Doxycycline + Augmentin for 2 weeks --> EOT: June 5th  Esophagitis precautions for doxycycline (give with breakfast / dinner, ample water and upright x 45m following doses    Principal Problem:   Polymicrobial bacterial infection Active Problems:   Fever of undetermined origin   Lactic acidosis   Generalized weakness   Bronchitis   Bilateral pleural effusion   Right foot ulcer (HCC)   Essential hypertension   BPH (benign prostatic hyperplasia)   Paroxysmal atrial fibrillation (HCC)   Osteomyelitis of fifth toe of right foot (HCC)    Bacteremia   Acute cystitis without hematuria   PVD (peripheral vascular disease) (HCC)    amoxicillin-clavulanate  1 tablet Oral Q12H   apixaban  5 mg Oral BID   vitamin C  1,000 mg Oral Daily   aspirin EC  81 mg Oral Daily   atorvastatin  40 mg Oral Daily   cyanocobalamin  1,000 mcg Intramuscular Daily   dextromethorphan-guaiFENesin  1 tablet Oral BID   docusate sodium  100 mg Oral Daily   doxycycline  100 mg Oral Q12H   furosemide  20 mg Intravenous Daily   leptospermum manuka honey  1 Application Topical Daily   multivitamin with minerals  1 tablet Oral Daily   nutrition supplement (JUVEN)  1 packet Oral BID BM   omega-3 acid ethyl esters  1 g Oral Daily   pantoprazole  40 mg Oral Daily   senna-docusate  1 tablet Oral BID   sodium chloride flush  3 mL Intravenous Q12H   tamsulosin  0.4 mg Oral QPC supper   zinc sulfate  220 mg Oral Daily    SUBJECTIVE: Worried about the clock and not being able to see it as much. Little anxious about meal ordering.    Review of Systems: Review of Systems  Unable to perform ROS: Other  Anxiety over other things    Allergies  Allergen Reactions   Codeine Swelling    OBJECTIVE: Vitals:   09/08/22 2300 09/09/22  0330 09/09/22 0723 09/09/22 1103  BP: (!) 114/54 (!) 103/46 132/70 (!) 142/56  Pulse: (!) 46 (!) 41 (!) 47 (!) 51  Resp: 14 15 18 15   Temp: 98.3 F (36.8 C) 98 F (36.7 C) (!) 97.5 F (36.4 C) (!) 97.3 F (36.3 C)  TempSrc: Oral Oral Oral Oral  SpO2: 100% 100% 100% 100%  Weight:      Height:       Body mass index is 28.35 kg/m.  Physical Exam Vitals and nursing note reviewed.  Constitutional:      Appearance: Normal appearance. He is not ill-appearing.  HENT:     Head: Normocephalic.     Mouth/Throat:     Mouth: Mucous membranes are moist.     Pharynx: Oropharynx is clear.  Eyes:     General: No scleral icterus. Cardiovascular:     Rate and Rhythm: Regular rhythm. Bradycardia present.  Pulmonary:      Effort: Pulmonary effort is normal.  Musculoskeletal:        General: Normal range of motion.     Cervical back: Normal range of motion.  Skin:    Coloration: Skin is not jaundiced or pale.  Neurological:     Mental Status: He is alert and oriented to person, place, and time.  Psychiatric:        Mood and Affect: Mood normal.        Judgment: Judgment normal.     Lab Results Lab Results  Component Value Date   WBC 5.3 09/09/2022   HGB 7.4 (L) 09/09/2022   HCT 22.5 (L) 09/09/2022   MCV 102.3 (H) 09/09/2022   PLT 298 09/09/2022    Lab Results  Component Value Date   CREATININE 1.07 09/09/2022   BUN 28 (H) 09/09/2022   NA 133 (L) 09/09/2022   K 4.3 09/09/2022   CL 99 09/09/2022   CO2 24 09/09/2022    Lab Results  Component Value Date   ALT 18 08/27/2022   AST 33 08/27/2022   ALKPHOS 46 08/27/2022   BILITOT 0.8 08/27/2022     Microbiology: Recent Results (from the past 240 hour(s))  Surgical PCR screen     Status: None   Collection Time: 09/02/22  1:21 AM   Specimen: Nasal Mucosa; Nasal Swab  Result Value Ref Range Status   MRSA, PCR NEGATIVE NEGATIVE Final   Staphylococcus aureus NEGATIVE NEGATIVE Final    Comment: (NOTE) The Xpert SA Assay (FDA approved for NASAL specimens in patients 2 years of age and older), is one component of a comprehensive surveillance program. It is not intended to diagnose infection nor to guide or monitor treatment. Performed at [facility], [address], [place], [place] [address]      Rexene Alberts, MSN, NP-C [facility]  [email or web address] Pager: [phone] Office: [phone] [facility] Main Line: [phone] *Secure Chat Communication Welcome   Total Encounter Time: 20 m

## 2022-09-09 NOTE — Progress Notes (Signed)
Patient ID: Jesse Pollock Sr., male   DOB: 1928/09/24, 89 y.o.   MRN: [id number] Patient is postoperative day 1 right foot fifth ray amputation.  Minimal drainage in the wound VAC canister plan.  Plan for discharge with the Praveena plus portable wound VAC pump.  Minimize weightbearing right lower extremity.

## 2022-09-09 NOTE — Progress Notes (Signed)
PROGRESS NOTE [name]  ZOX:[id number] DOB: [birth year] DOA: 08/24/2022 PCP: Jesse Nevins, MD  Brief Narrative/Hospital Course: Jesse Pollock Sr. is a 89 y.o. male with a history of hypertension, BPH, prostate cancer presented secondary to generalized weakness, difficulty ambulation a few weeks on 08/25/22  Seen in the ED, found to have anemia, lactic acidosis underwent CT angio chest, CT abdomen pelvis, x-ray left foot right foot chest x-ray> and admitted for sepsis/fever, lactic acidosis with right foot ulcer, bronchitis Workup revealed gram-positive bacteremia, Aerococcus//Actinotignum schaalii bacteremia right foot osteomyelitis of fifth metatarsal head.  ABI noted poor circulation/critical ischemia with tissue loss at the toes- underwent angiogram  5/16:with recanalization/balloon angioplasty of the tibioperoneal trunk and resolution of flow-limiting stenosis within the posterior tibial artery by vascular [name]. [name] has been consulted> planning for right foot fifth ray amputation. Seen by infectious disease, completed antibiotics and monitoring off antibiotic Seen by cardiology for new onset A-fib given his high CHA2DS2-VASc score placed on heparin drip 5/20    Subjective: Seen examined this morning.  He is resting comfortably.   He is waiting for his amputation this morning  No new complaints Overnight afebrile BP stable labs with creatinine 1.1 hemoglobin 7.6 this morning  Assessment and Plan: Principal Problem:   Polymicrobial bacterial infection Active Problems:   Fever of undetermined origin   Lactic acidosis   Generalized weakness   Bronchitis   Bilateral pleural effusion   Right foot ulcer (HCC)   Essential hypertension   BPH (benign prostatic hyperplasia)   Paroxysmal atrial fibrillation (HCC)   Osteomyelitis of fifth toe of right foot (HCC)   Bacteremia   Acute cystitis without hematuria   PVD (peripheral vascular disease) (HCC)   Sepsis POA w/  Gram-positive bacteremia/Aerococcus//Actinotignum schaalii bacteremia Completed antibiotics ID following monitoring off antibiotics.  Seen by vascular and podiatry see below.  Awaiting for amputation 5/22  Right foot osteomyelitis of fifth metatarsal head: Critical critical ischemia with tissue loss at the toes: S/p recanalization/balloon angioplasty of the tibioperoneal trunk and resolution of flow-limiting stenosis within the posterior tibial artery by vascular [name].  S/P right foot fifth ray amputation 5/22, continue wound VAC and planning to discharge with Praveena plus portable wound VAC pump per [name] minimize weightbearing right lower extremity. On aspirin, Plavix Lipitor, and heparin- discussed w/ [name] today at 11:46 am, he was in OR- on phone-he advised to continue aspirin and Eliquis since he also has A-fib Appreciate ID input noted plan to continue po antibiotics for few weeks.  A-fib with slow ventricular response rate:Echo with normal EF.Appears new onset- w/ high chads2vasc score high of 5, appreciate input from cardiology, heparin drip started 5/20 will eventually need to switch to Eliquis and adjust antiplatelets- see above.  Peripheral neuropathy continue B12 supplement. Debility deconditioning weakness continue PT OT, recommending skilled nursing facility upon discharge Prostate cancer followed by urology continue Flomax sclerotic focus noted on left second rib and nonspecific-May need to discuss with IR prior to discharge or close follow up w/ urology. He follows [name].  Hyponatremia:Mild,monitor.  Anemia of chronic disease and b12 deficiency: anemia panel w/ iron  saturation 60 normal I folate, B12 was previously low on 5/7 < 50 and on B12 supplement IM daily.  Monitor and transfuse if further drop or < 7 gm Recent Labs  Lab 09/06/22 0218 09/07/22 0147 09/08/22 0231 09/09/22 0124  HGB 8.1* 7.8* 7.6* 7.4*  HCT 25.2* 24.2* 23.9* 22.5*  Pressure  injury of the left  heel, right buttock POA  DVT prophylaxis: SCD's Start: 09/08/22 1209 SCDs Start: 08/25/22 0026 Code Status:   Code Status: Full Code Family Communication: plan of care discussed with patient, and his son and daughter at bedside. Patient status is:  inpatient  because of critical limb ischemia Level of care: Progressive Cardiac   Dispo: The patient is from: home, lives alone has family nearby, and daughter now lives with him            Anticipated disposition: SNF once available  Objective: Vitals last 24 hrs: Vitals:   09/08/22 2300 09/09/22 0330 09/09/22 0723 09/09/22 1103  BP: (!) 114/54 (!) 103/46 132/70 (!) 142/56  Pulse: (!) 46 (!) 41 (!) 47 (!) 51  Resp: 14 15 18 15   Temp: 98.3 F (36.8 C) 98 F (36.7 C) (!) 97.5 F (36.4 C) (!) 97.3 F (36.3 C)  TempSrc: Oral Oral Oral Oral  SpO2: 100% 100% 100% 100%  Weight:      Height:       Weight change:   Physical Examination: General exam: AA, weak,older appearing HEENT:Oral mucosa moist, Ear/Nose WNL grossly, dentition normal. Respiratory system: bilaterally clear BS, no use of accessory muscle Cardiovascular system: S1 & S2 +, regular rate,. Gastrointestinal system: Abdomen soft, NT,ND,BS+ Nervous System:Alert, awake, moving extremities and grossly nonfocal Extremities: LE ankle edema neg rt foor w. Vac+ Skin: No rashes,no icterus. MSK: Normal muscle bulk,tone, power   Medications reviewed:  Scheduled Meds:  amoxicillin-clavulanate  1 tablet Oral Q12H   vitamin C  1,000 mg Oral Daily   aspirin EC  81 mg Oral Daily   atorvastatin  40 mg Oral Daily   clopidogrel  75 mg Oral Q breakfast   cyanocobalamin  1,000 mcg Intramuscular Daily   dextromethorphan-guaiFENesin  1 tablet Oral BID   docusate sodium  100 mg Oral Daily   doxycycline  100 mg Oral Q12H   furosemide  20 mg Intravenous Daily   leptospermum manuka honey  1 Application Topical Daily   multivitamin with minerals  1 tablet Oral Daily    nutrition supplement (JUVEN)  1 packet Oral BID BM   omega-3 acid ethyl esters  1 g Oral Daily   pantoprazole  40 mg Oral Daily   senna-docusate  1 tablet Oral BID   sodium chloride flush  3 mL Intravenous Q12H   tamsulosin  0.4 mg Oral QPC supper   zinc sulfate  220 mg Oral Daily   Continuous Infusions:  sodium chloride     sodium chloride 75 mL/hr at 09/08/22 1854   heparin 1,000 Units/hr (09/08/22 1854)   magnesium sulfate bolus IVPB     Diet Order             Diet Carb Modified Fluid consistency: Thin; Room service appropriate? Yes  Diet effective now                  Intake/Output Summary (Last 24 hours) at 09/09/2022 1142 Last data filed at 09/09/2022 1106 Gross per 24 hour  Intake 2194.73 ml  Output 700 ml  Net 1494.73 ml    Net IO Since Admission: -866.61 mL [09/09/22 1142]  Wt Readings from Last 3 Encounters:  09/08/22 82.1 kg  07/06/22 97.5 kg  05/20/20 97.5 kg     Unresulted Labs (From admission, onward)     Start     Ordered   09/10/22 0500  Heparin level (unfractionated)  Daily,  R     Question:  Specimen collection method  Answer:  Lab=Lab collect   09/09/22 0151   09/09/22 1120  Heparin level (unfractionated)  Once,   R        09/09/22 1120   09/07/22 0500  Basic metabolic panel  Daily,   R     Question:  Specimen collection method  Answer:  Lab=Lab collect   09/06/22 1041   09/07/22 0500  CBC  Daily,   R     Question:  Specimen collection method  Answer:  Lab=Lab collect   09/06/22 1041          Data Reviewed: I have personally reviewed following labs and imaging studies CBC: Recent Labs  Lab 09/06/22 0218 09/07/22 0147 09/08/22 0231 09/09/22 0124  WBC 7.4 7.6 8.9 5.3  NEUTROABS 3.7  --   --   --   HGB 8.1* 7.8* 7.6* 7.4*  HCT 25.2* 24.2* 23.9* 22.5*  MCV 104.6* 103.9* 103.9* 102.3*  PLT 284 291 301 298    Basic Metabolic Panel: Recent Labs  Lab 09/06/22 0218 09/07/22 0147 09/08/22 0231 09/09/22 0124  NA 132* 134* 131*  133*  K 3.7 3.6 4.1 4.3  CL 99 102 98 99  CO2 26 25 26 24   GLUCOSE 106* 89 126* 127*  BUN 22 25* 27* 28*  CREATININE 1.08 1.11 1.14 1.07  CALCIUM 8.0* 8.0* 8.0* 8.3*  MG 2.1  --   --   --   PHOS 3.7  --   --   --     Recent Labs  Lab 09/06/22 0218  ALBUMIN 2.5*   No results for input(s): "PROCALCITON", "LATICACIDVEN" in the last 168 hours.  Recent Results (from the past 240 hour(s))  Surgical PCR screen     Status: None   Collection Time: 09/02/22  1:21 AM   Specimen: Nasal Mucosa; Nasal Swab  Result Value Ref Range Status   MRSA, PCR NEGATIVE NEGATIVE Final   Staphylococcus aureus NEGATIVE NEGATIVE Final    Comment: (NOTE) The Xpert SA Assay (FDA approved for NASAL specimens in patients 31 years of age and older), is one component of a comprehensive surveillance program. It is not intended to diagnose infection nor to guide or monitor treatment. Performed at [facility], [address], [place], [place] [address]     Antimicrobials: Anti-infectives (From admission, onward)    Start     Dose/Rate Route Frequency Ordered Stop   09/09/22 1130  amoxicillin-clavulanate (AUGMENTIN) 875-125 MG per tablet 1 tablet        1 tablet Oral Every 12 hours 09/09/22 1037 09/23/22 0959   09/09/22 1130  doxycycline (VIBRA-TABS) tablet 100 mg        100 mg Oral Every 12 hours 09/09/22 1037 09/23/22 0959   09/08/22 1800  ceFAZolin (ANCEF) IVPB 2g/100 mL premix        2 g 200 mL/hr over 30 Minutes Intravenous Every 8 hours 09/08/22 1208 09/09/22 0357   09/08/22 1630  cefTRIAXone (ROCEPHIN) 2 g in sodium chloride 0.9 % 100 mL IVPB  Status:  Discontinued        2 g 200 mL/hr over 30 Minutes Intravenous Every 24 hours 09/08/22 1427 09/09/22 1037   09/08/22 1600  DAPTOmycin (CUBICIN) 650 mg in sodium chloride 0.9 % IVPB  Status:  Discontinued        8 mg/kg  82.1 kg 126 mL/hr over 30 Minutes Intravenous Daily 09/08/22 1427 09/09/22 1037  09/08/22 0600  ceFAZolin (ANCEF) IVPB  2g/100 mL premix        2 g 200 mL/hr over 30 Minutes Intravenous On call to O.R. 09/08/22 0434 09/08/22 1039   08/31/22 1700  Ampicillin-Sulbactam (UNASYN) 3 g in sodium chloride 0.9 % 100 mL IVPB  Status:  Discontinued        3 g 200 mL/hr over 30 Minutes Intravenous Every 6 hours 08/31/22 1028 09/01/22 0911   08/30/22 2200  cefTRIAXone (ROCEPHIN) 2 g in sodium chloride 0.9 % 100 mL IVPB  Status:  Discontinued        2 g 200 mL/hr over 30 Minutes Intravenous Every 24 hours 08/30/22 1143 08/31/22 1028   08/30/22 1615  amoxicillin (AMOXIL) capsule 500 mg        500 mg Oral  Once 08/30/22 1522 08/30/22 1730   08/30/22 1415  amoxicillin (AMOXIL) capsule 500 mg  Status:  Discontinued        500 mg Oral  Once 08/30/22 1318 08/30/22 1522   08/25/22 2200  vancomycin (VANCOCIN) IVPB 1000 mg/200 mL premix  Status:  Discontinued        1,000 mg 200 mL/hr over 60 Minutes Intravenous Every 24 hours 08/24/22 2126 08/30/22 1143   08/25/22 1800  metroNIDAZOLE (FLAGYL) tablet 500 mg  Status:  Discontinued        500 mg Oral Every 12 hours 08/25/22 1709 08/31/22 1028   08/24/22 2130  ceFEPIme (MAXIPIME) 2 g in sodium chloride 0.9 % 100 mL IVPB  Status:  Discontinued        2 g 200 mL/hr over 30 Minutes Intravenous  Once 08/24/22 2120 08/24/22 2126   08/24/22 2130  metroNIDAZOLE (FLAGYL) IVPB 500 mg        500 mg 100 mL/hr over 60 Minutes Intravenous  Once 08/24/22 2120 08/24/22 2312   08/24/22 2130  vancomycin (VANCOCIN) IVPB 1000 mg/200 mL premix  Status:  Discontinued        1,000 mg 200 mL/hr over 60 Minutes Intravenous  Once 08/24/22 2120 08/24/22 2126   08/24/22 2130  ceFEPIme (MAXIPIME) 2 g in sodium chloride 0.9 % 100 mL IVPB  Status:  Discontinued        2 g 200 mL/hr over 30 Minutes Intravenous Every 12 hours 08/24/22 2126 08/26/22 1426   08/24/22 2130  vancomycin (VANCOREADY) IVPB 1250 mg/250 mL        1,250 mg 166.7 mL/hr over 90 Minutes Intravenous  Once 08/24/22 2126 08/25/22 0029       Culture/Microbiology    Component Value Date/Time   SDES BLOOD RIGHT HAND 08/26/2022 1150   SDES RIGHT ANTECUBITAL 08/26/2022 1150   SPECREQUEST  08/26/2022 1150    BOTTLES DRAWN AEROBIC AND ANAEROBIC Blood Culture results may not be optimal due to an excessive volume of blood received in culture bottles   SPECREQUEST  08/26/2022 1150    BOTTLES DRAWN AEROBIC AND ANAEROBIC Blood Culture adequate volume   CULT  08/26/2022 1150    NO GROWTH 5 DAYS Performed at [facility], [address], [place], [place] [address]    CULT  08/26/2022 1150    NO GROWTH 5 DAYS Performed at [facility], [address], [place], [place] [address]    REPTSTATUS 08/31/2022 FINAL 08/26/2022 1150   REPTSTATUS 08/31/2022 FINAL 08/26/2022 1150  Radiology Studies: No results found.   LOS: 15 days   Lanae Boast, MD [facility]  09/09/2022, 11:42 AM

## 2022-09-09 NOTE — Progress Notes (Signed)
ANTICOAGULATION CONSULT NOTE - follow-up  Pharmacy Consult for heparin transition to apixaban Indication: atrial fibrillation  Allergies  Allergen Reactions   Codeine Swelling    Patient Measurements: Height: 5\' 7"  (170.2 cm) Weight: 82.1 kg (181 lb) IBW/kg (Calculated) : 66.1 HEPARIN DW (KG): 82.1   Vital Signs: Temp: 97.5 F (36.4 C) (05/23 0723) Temp Source: Oral (05/23 0723) BP: 132/70 (05/23 0723) Pulse Rate: 47 (05/23 0723)  Labs: Recent Labs    09/07/22 0147 09/08/22 0231 09/09/22 0124  HGB 7.8* 7.6* 7.4*  HCT 24.2* 23.9* 22.5*  PLT 291 301 298  HEPARINUNFRC 0.59 0.89* 0.39  CREATININE 1.11 1.14 1.07  CKTOTAL  --   --  69    Estimated Creatinine Clearance: 45.2 mL/min (by C-G formula based on SCr of 1.07 mg/dL).   Medical History: Past Medical History:  Diagnosis Date   Hypertension    Kidney calculus 2014;2013   Pneumonia    Prostate cancer (HCC)    Shortness of breath     Medications:  Medications Prior to Admission  Medication Sig Dispense Refill Last Dose   alendronate (FOSAMAX) 70 MG tablet TAKE 1 TABLET EVERY WEEK IN THE MORNING 30 MIN BEFORE EATING WITH AN 8OZ GLASS OF WATER (SIT UP 30 MIN) 4 tablet 11 Past Week   aspirin EC 81 MG tablet Take 81 mg by mouth daily.   08/24/2022   benazepril (LOTENSIN) 10 MG tablet Take 10 mg by mouth daily.   08/24/2022   fish oil-omega-3 fatty acids 1000 MG capsule Take 1 g by mouth daily.   08/24/2022   hydrochlorothiazide (HYDRODIURIL) 25 MG tablet Take 25 mg by mouth daily.   08/24/2022   tamsulosin (FLOMAX) 0.4 MG CAPS capsule Take 0.4 mg by mouth daily after supper.   Past Week   furosemide (LASIX) 20 MG tablet Take 20-40 mg by mouth daily as needed for fluid.  (Patient not taking: Reported on 08/25/2022)   Not Taking   Scheduled:   amoxicillin-clavulanate  1 tablet Oral Q12H   vitamin C  1,000 mg Oral Daily   aspirin EC  81 mg Oral Daily   atorvastatin  40 mg Oral Daily   clopidogrel  75 mg Oral Q breakfast    cyanocobalamin  1,000 mcg Intramuscular Daily   dextromethorphan-guaiFENesin  1 tablet Oral BID   docusate sodium  100 mg Oral Daily   doxycycline  100 mg Oral Q12H   furosemide  20 mg Intravenous Daily   leptospermum manuka honey  1 Application Topical Daily   multivitamin with minerals  1 tablet Oral Daily   nutrition supplement (JUVEN)  1 packet Oral BID BM   omega-3 acid ethyl esters  1 g Oral Daily   pantoprazole  40 mg Oral Daily   senna-docusate  1 tablet Oral BID   sodium chloride flush  3 mL Intravenous Q12H   tamsulosin  0.4 mg Oral QPC supper   zinc sulfate  220 mg Oral Daily    Assessment: 89 yo male with new afib to start heparin. He is noted with PAD s/p balloon angioplasty on plavix and asa  5/23 AM update: Consult received to dc heparin and plavix. Place patient on apixaban and continue aspirin  Goal of Therapy:  Monitor platelets by anticoagulation protocol: Yes   Plan:  Discontinue heparin gtt and plavix Continue aspirin EC 81 mg po qday Start apixaban 5 mg po bid Pharmacy will sign off consults for heparin and apixaban transition but continue to monitor  making recommendations prn Thank you for the consult    Greta Doom BS, PharmD, BCPS Clinical Pharmacist 09/09/2022 11:51 AM  Contact: [phone] after 3 PM  "Be curious, not judgmental..." -Debbora Dus

## 2022-09-09 NOTE — Discharge Instructions (Addendum)

## 2022-09-09 NOTE — Progress Notes (Signed)
ANTICOAGULATION CONSULT NOTE - Follow Up Consult  Pharmacy Consult for heparin Indication: atrial fibrillation  Labs: Recent Labs    09/07/22 0147 09/08/22 0231 09/09/22 0124  HGB 7.8* 7.6* 7.4*  HCT 24.2* 23.9* 22.5*  PLT 291 301 298  HEPARINUNFRC 0.59 0.89* 0.39  CREATININE 1.11 1.14 1.07  CKTOTAL  --   --  69    Assessment/Plan:  89yo male therapeutic on heparin after resumed s/p amputation. Will continue infusion at current rate of 1000 units/hr and confirm stable with additional level.  Vernard Gambles, PharmD, BCPS 09/09/2022 2:53 AM

## 2022-09-09 NOTE — Progress Notes (Signed)
Occupational Therapy Treatment Patient Details Name: [name] Sr. MRN: [id number] DOB: 21-Aug-1928 Today's Date: 09/09/2022   History of present illness 89 y.o. male presents to [facility] 08/24/2022 due to generalized progressive weakness. 5/8 found to be febrile with leukocytosis, started on antibiotics for sepsis with unclear source. 5/9 Transferred to [facility] hospital found to have gram-positive cocci bacteremia with possible R foot osteomyelitis. Patient underwent balloon angioplasty of the posterior tibial artery by the vascular surgery team 5/16. Underwent R 5th ray amputation onf 5/22.PMH includes hypertension, BPH, prostate cancer.   OT comments  Pt seen follow R 5th ray surgery yesterday, now in post op shoe TDWB. Pt requires +2 mod assist for bed mobility and +2 mod to max assist to stand, but was able to maintain minimal weight on R LE in static standing. Did not progress to chair as pt not able to maintain WB status for transfer. Total assist to don post op shoe. Pt participating well and stating he felt encouraged at end of session. VSS throughout session. Continue to recommend post acute therapy < 3 hours a day.    Recommendations for follow up therapy are one component of a multi-disciplinary discharge planning process, led by the attending physician.  Recommendations may be updated based on patient status, additional functional criteria and insurance authorization.    Assistance Recommended at Discharge Frequent or constant Supervision/Assistance  Patient can return home with the following  Two people to help with walking and/or transfers;A lot of help with bathing/dressing/bathroom;Assistance with cooking/housework;Assist for transportation;Help with stairs or ramp for entrance   Equipment Recommendations  Other (comment) (defer to next venue)    Recommendations for Other Services      Precautions / Restrictions Precautions Precautions: Fall Required Braces or Orthoses: Other  Brace Other Brace: R post op shoe Restrictions Weight Bearing Restrictions: Yes RLE Weight Bearing: Touchdown weight bearing       Mobility Bed Mobility Overal bed mobility: Needs Assistance Bed Mobility: Supine to Sit     Supine to sit: +2 for physical assistance, Mod assist, HOB elevated Sit to supine: +2 for physical assistance, Mod assist   General bed mobility comments: cues for sequencing, assist for LEs over EOB, especially R LE, to raise trunk and for hips to EOB with bed pad, guided trunk and assisted LEs back into bed    Transfers Overall transfer level: Needs assistance Equipment used: Rolling walker (2 wheels) Transfers: Sit to/from Stand Sit to Stand: +2 physical assistance, Max assist, Mod assist, From elevated surface           General transfer comment: cues for hand placement, assist to rise and steady, requiring more assist second trial with increased posterior lean, pt able to maintain minimal weigh on R LE in static standing     Balance Overall balance assessment: Needs assistance   Sitting balance-Leahy Scale: Fair Sitting balance - Comments: posterior bias, but no LOB   Standing balance support: Bilateral upper extremity supported, During functional activity, Reliant on assistive device for balance Standing balance-Leahy Scale: Poor                             ADL either performed or assessed with clinical judgement   ADL Overall ADL's : Needs assistance/impaired                     Lower Body Dressing: Total assistance;Bed level Lower Body Dressing Details (indicate cue  type and reason): post op shoe               General ADL Comments: educated in WB status on R foot    Extremity/Trunk Assessment              Vision       Perception     Praxis      Cognition Arousal/Alertness: Awake/alert Behavior During Therapy: WFL for tasks assessed/performed Overall Cognitive Status: Within Functional Limits for  tasks assessed                                 General Comments: difficult to assess due to [facility], cousin in room and reports his memory is good        Exercises      Shoulder Instructions       General Comments      Pertinent Vitals/ Pain       Pain Assessment Pain Assessment: No/denies pain  Home Living                                          Prior Functioning/Environment              Frequency  Min 1X/week        Progress Toward Goals  OT Goals(current goals can now be found in the care plan section)  Progress towards OT goals: Progressing toward goals  Acute Rehab OT Goals OT Goal Formulation: With patient Time For Goal Achievement: 09/23/22 Potential to Achieve Goals: Fair ADL Goals Pt Will Perform Grooming: with supervision;sitting Pt Will Perform Upper Body Dressing: with supervision;sitting Pt Will Perform Lower Body Dressing: with max assist;sit to/from stand Pt Will Transfer to Toilet: with +2 assist;stand pivot transfer;with min assist;bedside commode Pt/caregiver will Perform Home Exercise Program: Increased strength;Both right and left upper extremity;Independently;With theraband  Plan Discharge plan remains appropriate    Co-evaluation    PT/OT/SLP Co-Evaluation/Treatment: Yes Reason for Co-Treatment: For patient/therapist safety   OT goals addressed during session: Strengthening/ROM      AM-PAC OT "6 Clicks" Daily Activity     Outcome Measure   Help from another person eating meals?: None Help from another person taking care of personal grooming?: A Little Help from another person toileting, which includes using toliet, bedpan, or urinal?: A Lot Help from another person bathing (including washing, rinsing, drying)?: A Lot Help from another person to put on and taking off regular upper body clothing?: A Little Help from another person to put on and taking off regular lower body clothing?: Total 6  Click Score: 15    End of Session Equipment Utilized During Treatment: Gait belt;Rolling walker (2 wheels)  OT Visit Diagnosis: Unsteadiness on feet (R26.81);Other abnormalities of gait and mobility (R26.89);Muscle weakness (generalized) (M62.81)   Activity Tolerance Patient tolerated treatment well   Patient Left in bed;with call bell/phone within reach;with bed alarm set;with family/visitor present   Nurse Communication          Time: 1610-9604 OT Time Calculation (min): 32 min  Charges: OT General Charges $OT Visit: 1 Visit OT Treatments $Therapeutic Activity: 8-22 mins  Berna Spare, OTR/L Acute Rehabilitation Services Office: [phone]   Evern Bio 09/09/2022, 3:48 PM

## 2022-09-10 ENCOUNTER — Inpatient Hospital Stay (HOSPITAL_COMMUNITY)
Admission: RE | Admit: 2022-09-10 | Discharge: 2022-09-24 | DRG: 945 | Disposition: A | Discharge status: Skilled Nursing Facility | Payer: Medicare Other | Source: Intra-hospital | Attending: Physical Medicine and Rehabilitation | Admitting: Physical Medicine and Rehabilitation

## 2022-09-10 ENCOUNTER — Other Ambulatory Visit: Payer: Self-pay

## 2022-09-10 ENCOUNTER — Encounter (HOSPITAL_COMMUNITY): Payer: Self-pay | Admitting: Physical Medicine and Rehabilitation

## 2022-09-10 DIAGNOSIS — R262 Difficulty in walking, not elsewhere classified: Secondary | ICD-10-CM | POA: Diagnosis not present

## 2022-09-10 DIAGNOSIS — A419 Sepsis, unspecified organism: Secondary | ICD-10-CM | POA: Diagnosis present

## 2022-09-10 DIAGNOSIS — H9190 Unspecified hearing loss, unspecified ear: Secondary | ICD-10-CM | POA: Diagnosis present

## 2022-09-10 DIAGNOSIS — M869 Osteomyelitis, unspecified: Secondary | ICD-10-CM | POA: Diagnosis present

## 2022-09-10 DIAGNOSIS — R609 Edema, unspecified: Secondary | ICD-10-CM | POA: Diagnosis present

## 2022-09-10 DIAGNOSIS — Z8546 Personal history of malignant neoplasm of prostate: Secondary | ICD-10-CM | POA: Diagnosis not present

## 2022-09-10 DIAGNOSIS — I10 Essential (primary) hypertension: Secondary | ICD-10-CM | POA: Diagnosis present

## 2022-09-10 DIAGNOSIS — I4891 Unspecified atrial fibrillation: Secondary | ICD-10-CM | POA: Diagnosis present

## 2022-09-10 DIAGNOSIS — E46 Unspecified protein-calorie malnutrition: Secondary | ICD-10-CM | POA: Diagnosis present

## 2022-09-10 DIAGNOSIS — R32 Unspecified urinary incontinence: Secondary | ICD-10-CM | POA: Diagnosis present

## 2022-09-10 DIAGNOSIS — N400 Enlarged prostate without lower urinary tract symptoms: Secondary | ICD-10-CM | POA: Diagnosis present

## 2022-09-10 DIAGNOSIS — Z9842 Cataract extraction status, left eye: Secondary | ICD-10-CM

## 2022-09-10 DIAGNOSIS — Z800 Family history of malignant neoplasm of digestive organs: Secondary | ICD-10-CM

## 2022-09-10 DIAGNOSIS — Z7901 Long term (current) use of anticoagulants: Secondary | ICD-10-CM

## 2022-09-10 DIAGNOSIS — R600 Localized edema: Secondary | ICD-10-CM | POA: Insufficient documentation

## 2022-09-10 DIAGNOSIS — R7881 Bacteremia: Secondary | ICD-10-CM | POA: Diagnosis present

## 2022-09-10 DIAGNOSIS — E669 Obesity, unspecified: Secondary | ICD-10-CM | POA: Diagnosis present

## 2022-09-10 DIAGNOSIS — I739 Peripheral vascular disease, unspecified: Secondary | ICD-10-CM | POA: Diagnosis present

## 2022-09-10 DIAGNOSIS — E8770 Fluid overload, unspecified: Secondary | ICD-10-CM | POA: Diagnosis present

## 2022-09-10 DIAGNOSIS — Z87442 Personal history of urinary calculi: Secondary | ICD-10-CM

## 2022-09-10 DIAGNOSIS — E875 Hyperkalemia: Secondary | ICD-10-CM | POA: Diagnosis present

## 2022-09-10 DIAGNOSIS — C61 Malignant neoplasm of prostate: Secondary | ICD-10-CM | POA: Insufficient documentation

## 2022-09-10 DIAGNOSIS — M7989 Other specified soft tissue disorders: Secondary | ICD-10-CM | POA: Diagnosis present

## 2022-09-10 DIAGNOSIS — Z7982 Long term (current) use of aspirin: Secondary | ICD-10-CM

## 2022-09-10 DIAGNOSIS — Z89421 Acquired absence of other right toe(s): Secondary | ICD-10-CM

## 2022-09-10 DIAGNOSIS — K5900 Constipation, unspecified: Secondary | ICD-10-CM

## 2022-09-10 DIAGNOSIS — Z9841 Cataract extraction status, right eye: Secondary | ICD-10-CM

## 2022-09-10 DIAGNOSIS — G4700 Insomnia, unspecified: Secondary | ICD-10-CM | POA: Diagnosis present

## 2022-09-10 DIAGNOSIS — D649 Anemia, unspecified: Secondary | ICD-10-CM | POA: Diagnosis present

## 2022-09-10 DIAGNOSIS — E8779 Other fluid overload: Secondary | ICD-10-CM

## 2022-09-10 DIAGNOSIS — D7589 Other specified diseases of blood and blood-forming organs: Secondary | ICD-10-CM | POA: Diagnosis present

## 2022-09-10 DIAGNOSIS — L89152 Pressure ulcer of sacral region, stage 2: Secondary | ICD-10-CM | POA: Diagnosis present

## 2022-09-10 DIAGNOSIS — E871 Hypo-osmolality and hyponatremia: Secondary | ICD-10-CM | POA: Diagnosis present

## 2022-09-10 DIAGNOSIS — M6281 Muscle weakness (generalized): Secondary | ICD-10-CM | POA: Diagnosis present

## 2022-09-10 DIAGNOSIS — K5901 Slow transit constipation: Secondary | ICD-10-CM | POA: Diagnosis not present

## 2022-09-10 DIAGNOSIS — Z7401 Bed confinement status: Secondary | ICD-10-CM | POA: Diagnosis not present

## 2022-09-10 DIAGNOSIS — R5381 Other malaise: Secondary | ICD-10-CM | POA: Diagnosis present

## 2022-09-10 DIAGNOSIS — K449 Diaphragmatic hernia without obstruction or gangrene: Secondary | ICD-10-CM | POA: Diagnosis present

## 2022-09-10 DIAGNOSIS — E538 Deficiency of other specified B group vitamins: Secondary | ICD-10-CM | POA: Diagnosis present

## 2022-09-10 DIAGNOSIS — R2681 Unsteadiness on feet: Secondary | ICD-10-CM | POA: Diagnosis present

## 2022-09-10 DIAGNOSIS — R159 Full incontinence of feces: Secondary | ICD-10-CM | POA: Diagnosis not present

## 2022-09-10 DIAGNOSIS — K219 Gastro-esophageal reflux disease without esophagitis: Secondary | ICD-10-CM | POA: Diagnosis present

## 2022-09-10 DIAGNOSIS — M609 Myositis, unspecified: Secondary | ICD-10-CM | POA: Diagnosis present

## 2022-09-10 DIAGNOSIS — R3915 Urgency of urination: Secondary | ICD-10-CM | POA: Diagnosis present

## 2022-09-10 DIAGNOSIS — Z961 Presence of intraocular lens: Secondary | ICD-10-CM | POA: Diagnosis present

## 2022-09-10 DIAGNOSIS — K5641 Fecal impaction: Secondary | ICD-10-CM | POA: Diagnosis present

## 2022-09-10 DIAGNOSIS — G629 Polyneuropathy, unspecified: Secondary | ICD-10-CM | POA: Diagnosis present

## 2022-09-10 DIAGNOSIS — R001 Bradycardia, unspecified: Secondary | ICD-10-CM | POA: Diagnosis present

## 2022-09-10 DIAGNOSIS — N179 Acute kidney failure, unspecified: Secondary | ICD-10-CM | POA: Diagnosis not present

## 2022-09-10 DIAGNOSIS — Z923 Personal history of irradiation: Secondary | ICD-10-CM

## 2022-09-10 DIAGNOSIS — R7989 Other specified abnormal findings of blood chemistry: Secondary | ICD-10-CM | POA: Diagnosis present

## 2022-09-10 DIAGNOSIS — D62 Acute posthemorrhagic anemia: Secondary | ICD-10-CM | POA: Diagnosis present

## 2022-09-10 DIAGNOSIS — M25551 Pain in right hip: Secondary | ICD-10-CM | POA: Diagnosis not present

## 2022-09-10 DIAGNOSIS — E872 Acidosis, unspecified: Secondary | ICD-10-CM | POA: Diagnosis present

## 2022-09-10 DIAGNOSIS — M86171 Other acute osteomyelitis, right ankle and foot: Secondary | ICD-10-CM | POA: Diagnosis present

## 2022-09-10 DIAGNOSIS — Z79899 Other long term (current) drug therapy: Secondary | ICD-10-CM

## 2022-09-10 DIAGNOSIS — R41841 Cognitive communication deficit: Secondary | ICD-10-CM | POA: Diagnosis present

## 2022-09-10 DIAGNOSIS — Z6831 Body mass index (BMI) 31.0-31.9, adult: Secondary | ICD-10-CM

## 2022-09-10 DIAGNOSIS — Z87898 Personal history of other specified conditions: Secondary | ICD-10-CM

## 2022-09-10 DIAGNOSIS — E876 Hypokalemia: Secondary | ICD-10-CM | POA: Diagnosis present

## 2022-09-10 DIAGNOSIS — J40 Bronchitis, not specified as acute or chronic: Secondary | ICD-10-CM | POA: Diagnosis present

## 2022-09-10 DIAGNOSIS — L8990 Pressure ulcer of unspecified site, unspecified stage: Secondary | ICD-10-CM | POA: Diagnosis present

## 2022-09-10 DIAGNOSIS — Z7983 Long term (current) use of bisphosphonates: Secondary | ICD-10-CM

## 2022-09-10 DIAGNOSIS — Z4781 Encounter for orthopedic aftercare following surgical amputation: Secondary | ICD-10-CM | POA: Diagnosis not present

## 2022-09-10 LAB — CBC
HCT: 23.6 % — ABNORMAL LOW (ref 39.0–52.0)
Hemoglobin: 7.7 g/dL — ABNORMAL LOW (ref 13.0–17.0)
MCH: 34.5 pg — ABNORMAL HIGH (ref 26.0–34.0)
MCHC: 32.6 g/dL (ref 30.0–36.0)
MCV: 105.8 fL — ABNORMAL HIGH (ref 80.0–100.0)
Platelets: 296 10*3/uL (ref 150–400)
RBC: 2.23 MIL/uL — ABNORMAL LOW (ref 4.22–5.81)
RDW: 15.9 % — ABNORMAL HIGH (ref 11.5–15.5)
WBC: 6.9 10*3/uL (ref 4.0–10.5)
nRBC: 0 % (ref 0.0–0.2)

## 2022-09-10 LAB — BASIC METABOLIC PANEL
Anion gap: 7 (ref 5–15)
BUN: 32 mg/dL — ABNORMAL HIGH (ref 8–23)
CO2: 27 mmol/L (ref 22–32)
Calcium: 8.5 mg/dL — ABNORMAL LOW (ref 8.9–10.3)
Chloride: 100 mmol/L (ref 98–111)
Creatinine, Ser: 0.98 mg/dL (ref 0.61–1.24)
GFR, Estimated: >60 mL/min (ref >60)
Glucose, Bld: 99 mg/dL (ref 70–99)
Potassium: 4.2 mmol/L (ref 3.5–5.1)
Sodium: 134 mmol/L — ABNORMAL LOW (ref 135–145)

## 2022-09-10 MED ORDER — GUAIFENESIN-DM 100-10 MG/5ML PO SYRP: 5.0000 mL | ORAL_SOLUTION | Freq: Four times a day (QID) | ORAL | Status: DC | PRN

## 2022-09-10 MED ORDER — JUVEN PO PACK: 1.0000 | PACK | Freq: Two times a day (BID) | ORAL | 0 refills | Status: DC

## 2022-09-10 MED ORDER — FUROSEMIDE 20 MG PO TABS
40.0000 mg | ORAL_TABLET | Freq: Every day | ORAL | Status: DC | PRN
Start: 2022-09-10 — End: 2022-09-23

## 2022-09-10 MED ORDER — SENNOSIDES-DOCUSATE SODIUM 8.6-50 MG PO TABS: 1.0000 | ORAL_TABLET | Freq: Two times a day (BID) | ORAL | Status: AC

## 2022-09-10 MED ORDER — CYANOCOBALAMIN 1000 MCG/ML IJ SOLN
1000.0000 ug | Freq: Every day | INTRAMUSCULAR | Status: DC
  Administered 2022-09-11 – 2022-09-24 (×14): 1000 ug via INTRAMUSCULAR
  Filled 2022-09-10 (×14): qty 1

## 2022-09-10 MED ORDER — MEDIHONEY WOUND/BURN DRESSING EX PSTE
1.0000 | PASTE | Freq: Every day | CUTANEOUS | Status: DC
  Administered 2022-09-11 – 2022-09-16 (×6): 1 via TOPICAL
  Filled 2022-09-10: qty 44

## 2022-09-10 MED ORDER — VITAMIN C 500 MG PO TABS
1000.0000 mg | ORAL_TABLET | Freq: Every day | ORAL | Status: DC
  Administered 2022-09-11 – 2022-09-24 (×14): 1000 mg via ORAL
  Filled 2022-09-10 (×14): qty 2

## 2022-09-10 MED ORDER — PROCHLORPERAZINE 25 MG RE SUPP: 12.5000 mg | Freq: Four times a day (QID) | RECTAL | Status: DC | PRN

## 2022-09-10 MED ORDER — IPRATROPIUM-ALBUTEROL 0.5-2.5 (3) MG/3ML IN SOLN: 3.0000 mL | RESPIRATORY_TRACT | Status: DC | PRN

## 2022-09-10 MED ORDER — ATORVASTATIN CALCIUM 40 MG PO TABS
40.0000 mg | ORAL_TABLET | Freq: Every day | ORAL | Status: DC
  Administered 2022-09-11 – 2022-09-24 (×14): 40 mg via ORAL
  Filled 2022-09-10 (×14): qty 1

## 2022-09-10 MED ORDER — ATORVASTATIN CALCIUM 40 MG PO TABS: 40.0000 mg | ORAL_TABLET | Freq: Every day | ORAL | Status: AC

## 2022-09-10 MED ORDER — ACETAMINOPHEN 325 MG PO TABS: 325.0000 mg | ORAL_TABLET | Freq: Four times a day (QID) | ORAL | Status: DC | PRN

## 2022-09-10 MED ORDER — FUROSEMIDE 20 MG PO TABS
20.0000 mg | ORAL_TABLET | Freq: Every day | ORAL | Status: DC | PRN
  Administered 2022-09-18: 20 mg via ORAL
  Filled 2022-09-10: qty 1

## 2022-09-10 MED ORDER — FLEET ENEMA 7-19 GM/118ML RE ENEM: 1.0000 | ENEMA | Freq: Once | RECTAL | Status: DC | PRN

## 2022-09-10 MED ORDER — APIXABAN 5 MG PO TABS
5.0000 mg | ORAL_TABLET | Freq: Two times a day (BID) | ORAL | Status: DC
  Administered 2022-09-10 – 2022-09-24 (×28): 5 mg via ORAL
  Filled 2022-09-10 (×16): qty 1
  Filled 2022-09-10: qty 2
  Filled 2022-09-10 (×11): qty 1

## 2022-09-10 MED ORDER — AMOXICILLIN-POT CLAVULANATE 875-125 MG PO TABS
1.0000 | ORAL_TABLET | Freq: Two times a day (BID) | ORAL | Status: AC
  Administered 2022-09-10 – 2022-09-22 (×25): 1 via ORAL
  Filled 2022-09-10 (×25): qty 1

## 2022-09-10 MED ORDER — ACETAMINOPHEN 325 MG PO TABS
325.0000 mg | ORAL_TABLET | ORAL | Status: DC | PRN
  Administered 2022-09-23: 500 mg via ORAL

## 2022-09-10 MED ORDER — JUVEN PO PACK
1.0000 | PACK | Freq: Two times a day (BID) | ORAL | Status: DC
  Administered 2022-09-11 – 2022-09-22 (×18): 1 via ORAL
  Filled 2022-09-10 (×13): qty 1

## 2022-09-10 MED ORDER — OMEGA-3-ACID ETHYL ESTERS 1 G PO CAPS
1.0000 g | ORAL_CAPSULE | Freq: Every day | ORAL | Status: DC
  Administered 2022-09-11 – 2022-09-24 (×14): 1 g via ORAL
  Filled 2022-09-10 (×14): qty 1

## 2022-09-10 MED ORDER — PANTOPRAZOLE SODIUM 40 MG PO TBEC
40.0000 mg | DELAYED_RELEASE_TABLET | Freq: Every day | ORAL | Status: DC
  Administered 2022-09-11 – 2022-09-16 (×6): 40 mg via ORAL
  Filled 2022-09-10 (×6): qty 1

## 2022-09-10 MED ORDER — TAMSULOSIN HCL 0.4 MG PO CAPS
0.4000 mg | ORAL_CAPSULE | Freq: Every day | ORAL | Status: DC
  Administered 2022-09-10 – 2022-09-23 (×14): 0.4 mg via ORAL
  Filled 2022-09-10 (×14): qty 1

## 2022-09-10 MED ORDER — DOXYCYCLINE HYCLATE 100 MG PO TABS
100.0000 mg | ORAL_TABLET | Freq: Two times a day (BID) | ORAL | Status: AC
  Administered 2022-09-11 – 2022-09-22 (×23): 100 mg via ORAL
  Filled 2022-09-10 (×23): qty 1

## 2022-09-10 MED ORDER — ALUM & MAG HYDROXIDE-SIMETH 200-200-20 MG/5ML PO SUSP: 15.0000 mL | ORAL | 0 refills | Status: AC | PRN

## 2022-09-10 MED ORDER — BISACODYL 5 MG PO TBEC: 5.0000 mg | DELAYED_RELEASE_TABLET | Freq: Every day | ORAL | 0 refills | Status: DC | PRN

## 2022-09-10 MED ORDER — ZINC SULFATE 220 (50 ZN) MG PO CAPS: 220.0000 mg | ORAL_CAPSULE | Freq: Every day | ORAL | Status: DC

## 2022-09-10 MED ORDER — GUAIFENESIN-DM 100-10 MG/5ML PO SYRP: 15.0000 mL | ORAL_SOLUTION | ORAL | 0 refills | Status: DC | PRN

## 2022-09-10 MED ORDER — DOCUSATE SODIUM 100 MG PO CAPS: 100.0000 mg | ORAL_CAPSULE | Freq: Every day | ORAL | 0 refills | Status: DC

## 2022-09-10 MED ORDER — PROCHLORPERAZINE EDISYLATE 10 MG/2ML IJ SOLN: 5.0000 mg | Freq: Four times a day (QID) | INTRAMUSCULAR | Status: DC | PRN

## 2022-09-10 MED ORDER — OXYCODONE HCL 5 MG PO TABS: 10.0000 mg | ORAL_TABLET | ORAL | Status: DC | PRN

## 2022-09-10 MED ORDER — ASCORBIC ACID 1000 MG PO TABS: 1000.0000 mg | ORAL_TABLET | Freq: Every day | ORAL | Status: AC

## 2022-09-10 MED ORDER — AMOXICILLIN-POT CLAVULANATE 875-125 MG PO TABS: 1.0000 | ORAL_TABLET | Freq: Two times a day (BID) | ORAL | Status: DC

## 2022-09-10 MED ORDER — APIXABAN 5 MG PO TABS: 5.0000 mg | ORAL_TABLET | Freq: Two times a day (BID) | ORAL | Status: AC

## 2022-09-10 MED ORDER — OXYCODONE HCL 5 MG PO TABS: 5.0000 mg | ORAL_TABLET | ORAL | 0 refills | Status: DC | PRN

## 2022-09-10 MED ORDER — DOCUSATE SODIUM 100 MG PO CAPS
100.0000 mg | ORAL_CAPSULE | Freq: Every day | ORAL | Status: DC
  Administered 2022-09-11 – 2022-09-17 (×7): 100 mg via ORAL
  Filled 2022-09-10 (×8): qty 1

## 2022-09-10 MED ORDER — DOXYCYCLINE HYCLATE 100 MG PO TABS: 100.0000 mg | ORAL_TABLET | Freq: Two times a day (BID) | ORAL | Status: DC

## 2022-09-10 MED ORDER — BOOST PLUS PO LIQD
237.0000 mL | Freq: Three times a day (TID) | ORAL | Status: DC
  Administered 2022-09-11 – 2022-09-16 (×14): 237 mL via ORAL
  Filled 2022-09-10 (×17): qty 237

## 2022-09-10 MED ORDER — ASPIRIN 81 MG PO TBEC
81.0000 mg | DELAYED_RELEASE_TABLET | Freq: Every day | ORAL | Status: DC
  Administered 2022-09-11 – 2022-09-24 (×14): 81 mg via ORAL
  Filled 2022-09-10 (×14): qty 1

## 2022-09-10 MED ORDER — DIPHENHYDRAMINE HCL 25 MG PO CAPS: 25.0000 mg | ORAL_CAPSULE | Freq: Four times a day (QID) | ORAL | Status: DC | PRN

## 2022-09-10 MED ORDER — ZINC SULFATE 220 (50 ZN) MG PO CAPS
220.0000 mg | ORAL_CAPSULE | Freq: Every day | ORAL | Status: AC
  Administered 2022-09-11 – 2022-09-21 (×11): 220 mg via ORAL
  Filled 2022-09-10 (×11): qty 1

## 2022-09-10 MED ORDER — POLYETHYLENE GLYCOL 3350 17 G PO PACK: 17.0000 g | PACK | Freq: Every day | ORAL | 0 refills | Status: DC | PRN

## 2022-09-10 MED ORDER — OMEGA-3-ACID ETHYL ESTERS 1 G PO CAPS: 1.0000 g | ORAL_CAPSULE | Freq: Every day | ORAL | Status: AC

## 2022-09-10 MED ORDER — PROCHLORPERAZINE MALEATE 5 MG PO TABS: 5.0000 mg | ORAL_TABLET | Freq: Four times a day (QID) | ORAL | Status: DC | PRN

## 2022-09-10 MED ORDER — FUROSEMIDE 10 MG/ML IJ SOLN: 20.0000 mg | Freq: Every day | INTRAMUSCULAR | Status: DC

## 2022-09-10 MED ORDER — ADULT MULTIVITAMIN W/MINERALS CH: 1.0000 | ORAL_TABLET | Freq: Every day | ORAL | Status: AC

## 2022-09-10 MED ORDER — EPINEPHRINE 0.3 MG/0.3ML IJ SOAJ
0.3000 mg | Freq: Once | INTRAMUSCULAR | Status: DC | PRN
  Filled 2022-09-10: qty 0.3

## 2022-09-10 MED ORDER — MEDIHONEY WOUND/BURN DRESSING EX PSTE: 1.0000 | PASTE | Freq: Every day | CUTANEOUS | Status: DC

## 2022-09-10 MED ORDER — ONDANSETRON HCL 4 MG PO TABS: 4.0000 mg | ORAL_TABLET | Freq: Four times a day (QID) | ORAL | 0 refills | Status: DC | PRN

## 2022-09-10 MED ORDER — ALUM & MAG HYDROXIDE-SIMETH 200-200-20 MG/5ML PO SUSP: 30.0000 mL | ORAL | Status: DC | PRN

## 2022-09-10 MED ORDER — BISACODYL 10 MG RE SUPP: 10.0000 mg | Freq: Every day | RECTAL | Status: DC | PRN

## 2022-09-10 MED ORDER — SENNOSIDES-DOCUSATE SODIUM 8.6-50 MG PO TABS
1.0000 | ORAL_TABLET | Freq: Two times a day (BID) | ORAL | Status: DC
  Administered 2022-09-10 – 2022-09-24 (×28): 1 via ORAL
  Filled 2022-09-10 (×28): qty 1

## 2022-09-10 MED ORDER — ADULT MULTIVITAMIN W/MINERALS CH
1.0000 | ORAL_TABLET | Freq: Every day | ORAL | Status: DC
  Administered 2022-09-11 – 2022-09-24 (×14): 1 via ORAL
  Filled 2022-09-10 (×14): qty 1

## 2022-09-10 MED ORDER — PHENOL 1.4 % MT LIQD: 1.0000 | OROMUCOSAL | Status: DC | PRN

## 2022-09-10 NOTE — Progress Notes (Signed)
Patient arrived to unit with daughter at the bedside. No questions or concerns at this time.

## 2022-09-10 NOTE — Progress Notes (Signed)
Called report to RN at 4W. Pt going to rm [id number].  Lawson Radar, RN

## 2022-09-10 NOTE — Progress Notes (Signed)
PMR Admission Coordinator Pre-Admission Assessment  Patient: Jesse H Kustra Sr. is an 89 y.o., male MRN: [id number] DOB: 10/08/1928 Height: 5' 7" (170.2 cm) Weight: 82.1 kg  Insurance Information HMO:     PPO:      PCP:      IPA:      80/20:      OTHER:  PRIMARY: Medicare A/B      Policy#: [id number]      Subscriber: pt CM Name:       Phone#:      Fax#:  Pre-Cert#: verified online      Employer:  Benefits:  Phone #:      Name:  Eff. Date: A/B 04/20/95     Deduct: $1632      Out of Pocket Max: n/a      Life Max: n/a CIR: 100%      SNF: 20 full days Outpatient: 80%     Co-Pay: 20% Home Health: 100%      Co-Pay:  DME: 80%     Co-Pay: 20% Providers:  SECONDARY: [employer]      Policy#: [id number]     Phone#: [phone]  Financial Counselor:       Phone#:   The "Data Collection Information Summary" for patients in Inpatient Rehabilitation Facilities with attached "Privacy Act Statement-Health Care Records" was provided and verbally reviewed with: Patient and Family  Emergency Contact Information Contact Information     Name Relation Home Work Mobile   Webb,Ruth Daughter [phone]  [phone]       Current Medical History  Patient Admitting Diagnosis: debility   History of Present Illness: Pt is a 89 y/o male with PMH of HTN, BPH, and prostate cancer, admitted to [facility] 08[facility]/22 with progressive weakness.  In ED he became febrile 100.9, SBP 178, labs showed BUN 27, BNP 160, respiratory panel negative.  CTA chest showed no PE but did show small bilateral pleural effusions.  CT abd/pelvis showed no abnormality.  LE xrays negative except ulceration of the skin lateral to the fifth MTP joint.  CXR negative.  Pt started on empiric abx.  Blood work showed gram positive bacteremia with source of infection felt to be right foot.  He was transferred to [place] for treatment.  Infectious Disease recommended vancomycin and metronidazole and repeat cultures as well as monitoring  renal function.  MRI completed which showed osteomyelitis of fifth met head.  Consults for vascular for evaluation of circulation and ortho for amputation.  Pt underwent angiogram with balloon angioplasty of right tibioperoneal trunk and right posterior tibial artery on 5/16 per Dr. Robins.  Post op course complicated by new onset afib and cardiology consulted.  Recommendations for anticoagulation for stroke prevention.  On 5/22 he underwent a right fifth ray amp per Dr. Duda, post op TDWB.  Therapy evaluations completed.  MD requested CIR.      Patient's medical record from [facility] has been reviewed by the rehabilitation admission coordinator and physician.  Past Medical History  Past Medical History:  Diagnosis Date   Hypertension    Kidney calculus 2014;2013   Pneumonia    Prostate cancer (HCC)    Shortness of breath     Has the patient had major surgery during 100 days prior to admission? Yes  Family History   family history is not on file.  Current Medications  Current Facility-Administered Medications:    0.9 %  sodium chloride infusion, 250 mL, Intravenous, PRN, Duda, Marcus V,   MD   0.9 %  sodium chloride infusion, , Intravenous, Continuous, Duda, Marcus V, MD, Last Rate: 75 mL/hr at 09/08/22 1854, Infusion Verify at 09/08/22 1854   acetaminophen (TYLENOL) tablet 325-650 mg, 325-650 mg, Oral, Q6H PRN, Duda, Marcus V, MD, 650 mg at 09/08/22 1408   alum & mag hydroxide-simeth (MAALOX/MYLANTA) 200-200-20 MG/5ML suspension 15-30 mL, 15-30 mL, Oral, Q2H PRN, Duda, Marcus V, MD   amoxicillin-clavulanate (AUGMENTIN) 875-125 MG per tablet 1 tablet, 1 tablet, Oral, Q12H, Wallace, Andrew N, DO, 1 tablet at 09/10/22 0929   apixaban (ELIQUIS) tablet 5 mg, 5 mg, Oral, BID, Reome, Earle J, RPH, 5 mg at 09/10/22 0930   ascorbic acid (VITAMIN C) tablet 1,000 mg, 1,000 mg, Oral, Daily, Duda, Marcus V, MD, 1,000 mg at 09/10/22 0925   aspirin EC tablet 81 mg, 81 mg, Oral, Daily, Duda, Marcus V,  MD, 81 mg at 09/10/22 0930   atorvastatin (LIPITOR) tablet 40 mg, 40 mg, Oral, Daily, Duda, Marcus V, MD, 40 mg at 09/10/22 0925   bisacodyl (DULCOLAX) EC tablet 5 mg, 5 mg, Oral, Daily PRN, Duda, Marcus V, MD   cyanocobalamin (VITAMIN B12) injection 1,000 mcg, 1,000 mcg, Intramuscular, Daily, Duda, Marcus V, MD, 1,000 mcg at 09/10/22 0930   dextromethorphan-guaiFENesin (MUCINEX DM) 30-600 MG per 12 hr tablet 1 tablet, 1 tablet, Oral, BID, Duda, Marcus V, MD, 1 tablet at 09/10/22 0930   diphenhydrAMINE (BENADRYL) injection 25 mg, 25 mg, Intravenous, Once PRN, Duda, Marcus V, MD   docusate sodium (COLACE) capsule 100 mg, 100 mg, Oral, Daily, Duda, Marcus V, MD, 100 mg at 09/10/22 0925   doxycycline (VIBRA-TABS) tablet 100 mg, 100 mg, Oral, Q12H, Wallace, Andrew N, DO, 100 mg at 09/10/22 0930   EPINEPHrine (EPI-PEN) injection 0.3 mg, 0.3 mg, Intramuscular, Once PRN, Duda, Marcus V, MD   furosemide (LASIX) injection 20 mg, 20 mg, Intravenous, Daily, Duda, Marcus V, MD, 20 mg at 09/10/22 0929   guaiFENesin-dextromethorphan (ROBITUSSIN DM) 100-10 MG/5ML syrup 15 mL, 15 mL, Oral, Q4H PRN, Duda, Marcus V, MD   hydrALAZINE (APRESOLINE) injection 5 mg, 5 mg, Intravenous, Q20 Min PRN, Duda, Marcus V, MD   HYDROmorphone (DILAUDID) injection 0.5-1 mg, 0.5-1 mg, Intravenous, Q4H PRN, Duda, Marcus V, MD   ipratropium-albuterol (DUONEB) 0.5-2.5 (3) MG/3ML nebulizer solution 3 mL, 3 mL, Nebulization, Q4H PRN, Duda, Marcus V, MD   labetalol (NORMODYNE) injection 10 mg, 10 mg, Intravenous, Q10 min PRN, Duda, Marcus V, MD   leptospermum manuka honey (MEDIHONEY) paste 1 Application, 1 Application, Topical, Daily, Duda, Marcus V, MD, 1 Application at 09/10/22 0931   magnesium citrate solution 1 Bottle, 1 Bottle, Oral, Once PRN, Duda, Marcus V, MD   magnesium sulfate IVPB 2 g 50 mL, 2 g, Intravenous, Daily PRN, Duda, Marcus V, MD   metoprolol tartrate (LOPRESSOR) injection 2-5 mg, 2-5 mg, Intravenous, Q2H PRN, Duda,  Marcus V, MD   multivitamin with minerals tablet 1 tablet, 1 tablet, Oral, Daily, Duda, Marcus V, MD, 1 tablet at 09/10/22 0930   nutrition supplement (JUVEN) (JUVEN) powder packet 1 packet, 1 packet, Oral, BID BM, Duda, Marcus V, MD, 1 packet at 09/10/22 0930   omega-3 acid ethyl esters (LOVAZA) capsule 1 g, 1 g, Oral, Daily, Duda, Marcus V, MD, 1 g at 09/10/22 0929   ondansetron (ZOFRAN) injection 4 mg, 4 mg, Intravenous, Q6H PRN, Duda, Marcus V, MD   ondansetron (ZOFRAN) tablet 4 mg, 4 mg, Oral, Q6H PRN **OR** [DISCONTINUED] ondansetron (ZOFRAN) injection 4 mg, 4   mg, Intravenous, Q6H PRN, Adefeso, Oladapo, DO, 4 mg at 08/27/22 2053   oxyCODONE (Oxy IR/ROXICODONE) immediate release tablet 10-15 mg, 10-15 mg, Oral, Q4H PRN, Duda, Marcus V, MD   oxyCODONE (Oxy IR/ROXICODONE) immediate release tablet 5-10 mg, 5-10 mg, Oral, Q4H PRN, Duda, Marcus V, MD   pantoprazole (PROTONIX) EC tablet 40 mg, 40 mg, Oral, Daily, Duda, Marcus V, MD, 40 mg at 09/10/22 0925   phenol (CHLORASEPTIC) mouth spray 1 spray, 1 spray, Mouth/Throat, PRN, Duda, Marcus V, MD   polyethylene glycol (MIRALAX / GLYCOLAX) packet 17 g, 17 g, Oral, Daily PRN, Duda, Marcus V, MD, 17 g at 09/06/22 1732   potassium chloride SA (KLOR-CON M) CR tablet 20-40 mEq, 20-40 mEq, Oral, Daily PRN, Duda, Marcus V, MD   senna-docusate (Senokot-S) tablet 1 tablet, 1 tablet, Oral, BID, Duda, Marcus V, MD, 1 tablet at 09/10/22 0931   sodium chloride flush (NS) 0.9 % injection 3 mL, 3 mL, Intravenous, Q12H, Duda, Marcus V, MD, 3 mL at 09/09/22 0831   sodium chloride flush (NS) 0.9 % injection 3 mL, 3 mL, Intravenous, PRN, Duda, Marcus V, MD   tamsulosin (FLOMAX) capsule 0.4 mg, 0.4 mg, Oral, QPC supper, Duda, Marcus V, MD, 0.4 mg at 09/09/22 1739   zinc sulfate capsule 220 mg, 220 mg, Oral, Daily, Duda, Marcus V, MD, 220 mg at 09/10/22 0925  Patients Current Diet:  Diet Order             Diet Carb Modified Fluid consistency: Thin; Room service  appropriate? Yes  Diet effective now                   Precautions / Restrictions Precautions Precautions: Fall Precaution Comments: L heel, R buttocks pressure injuries; HoH Other Brace: R post op shoe Restrictions Weight Bearing Restrictions: Yes RLE Weight Bearing: Touchdown weight bearing Other Position/Activity Restrictions: HOH   Has the patient had 2 or more falls or a fall with injury in the past year? Yes  Prior Activity Level Household: household ambulatory with RW supervision level, w/c in community, supervision/assist for ADLs, assist for iADLs  Prior Functional Level Self Care: Did the patient need help bathing, dressing, using the toilet or eating? Needed some help  Indoor Mobility: Did the patient need assistance with walking from room to room (with or without device)? Needed some help  Stairs: Did the patient need assistance with internal or external stairs (with or without device)? Needed some help  Functional Cognition: Did the patient need help planning regular tasks such as shopping or remembering to take medications? Dependent  Patient Information Are you of Hispanic, Latino/a,or Spanish origin?: A. No, not of Hispanic, Latino/a, or Spanish origin What is your race?: B. Black or African American Do you need or want an interpreter to communicate with a doctor or health care staff?: 0. No  Patient's Response To:  Health Literacy and Transportation Is the patient able to respond to health literacy and transportation needs?: Yes Health Literacy - How often do you need to have someone help you when you read instructions, pamphlets, or other written material from your doctor or pharmacy?: Never In the past 12 months, has lack of transportation kept you from medical appointments or from getting medications?: No In the past 12 months, has lack of transportation kept you from meetings, work, or from getting things needed for daily living?: No  Home Assistive  Devices / Equipment Home Assistive Devices/Equipment: Walker (specify type), Cane (specify quad or straight)   Home Equipment: Rolling Walker (2 wheels), Cane - single point, BSC/3in1, Shower seat, Wheelchair - manual  Prior Device Use: Indicate devices/aids used by the patient prior to current illness, exacerbation or injury? Walker  Current Functional Level Cognition  Overall Cognitive Status: Within Functional Limits for tasks assessed Difficult to assess due to: Hard of hearing/deaf Orientation Level: Oriented X4 General Comments: difficult to assess due to HOH, cousin in room and reports his memory is good    Extremity Assessment (includes Sensation/Coordination)  Upper Extremity Assessment: Generalized weakness  Lower Extremity Assessment: Defer to PT evaluation    ADLs  Overall ADL's : Needs assistance/impaired Eating/Feeding: Modified independent Grooming: Min guard, Sitting Upper Body Bathing: Minimal assistance, Sitting, Min guard Lower Body Bathing: Maximal assistance, Sitting/lateral leans Upper Body Dressing : Minimal assistance, Sitting Upper Body Dressing Details (indicate cue type and reason): gown change Lower Body Dressing: Total assistance, Bed level Lower Body Dressing Details (indicate cue type and reason): post op shoe Toilet Transfer: Moderate assistance, +2 for physical assistance, BSC/3in1 Toilet Transfer Details (indicate cue type and reason): stedy used for transfer to BSC Toileting- Clothing Manipulation and Hygiene: Total assistance, Sit to/from stand Toileting - Clothing Manipulation Details (indicate cue type and reason): patient stood in Stedy for toilet hygiene with assistance of another to assist with balance Functional mobility during ADLs: Maximal assistance, Rolling walker (2 wheels) General ADL Comments: educated in WB status on R foot    Mobility  Overal bed mobility: Needs Assistance Bed Mobility: Supine to Sit Rolling: Min assist Sidelying  to sit: Mod assist, +2 for safety/equipment, HOB elevated Supine to sit: +2 for physical assistance, Mod assist, HOB elevated Sit to supine: +2 for physical assistance, Mod assist General bed mobility comments: cues for sequencing, assist for LEs over EOB, especially R LE, to raise trunk and for hips to EOB with bed pad, guided trunk and assisted LEs back into bed    Transfers  Overall transfer level: Needs assistance Equipment used: Rolling walker (2 wheels) Transfers: Sit to/from Stand Sit to Stand: +2 physical assistance, Max assist, Mod assist, From elevated surface Bed to/from chair/wheelchair/BSC transfer type:: Via Lift equipment Step pivot transfers: Mod assist, Max assist Transfer via Lift Equipment: Stedy General transfer comment: cues for hand placement, assist to rise and steady, requiring more assist second trial with increased posterior lean, pt able to maintain minimal weigh on R LE in static standing but uinable to progress to stepping without increasing wt through RLE    Ambulation / Gait / Stairs / Wheelchair Mobility  Ambulation/Gait Ambulation/Gait assistance: Max assist Gait Distance (Feet): 4 Feet Assistive device: Rolling walker (2 wheels) Gait Pattern/deviations: Decreased step length - right, Decreased step length - left, Decreased stride length, Shuffle, Trunk flexed General Gait Details: pt unable Gait velocity: slow    Posture / Balance Dynamic Sitting Balance Sitting balance - Comments: posterior bias, but no LOB Balance Overall balance assessment: Needs assistance Sitting-balance support: Bilateral upper extremity supported, Feet supported Sitting balance-Leahy Scale: Fair Sitting balance - Comments: posterior bias, but no LOB Postural control: Posterior lean Standing balance support: Bilateral upper extremity supported, During functional activity, Reliant on assistive device for balance Standing balance-Leahy Scale: Poor Standing balance comment:  dependent on BUE support and modA of 2 to maintain upright, posterior lean    Special needs/care consideration Wound Vac R foot and Skin surgical incision R foot   Previous Home Environment (from acute therapy documentation) Living Arrangements: Children Available Help at Discharge: Family, Available   PRN/intermittently, Other (Comment) Type of Home: House Home Layout: Able to live on main level with bedroom/bathroom, Laundry or work area in basement Home Access: Ramped entrance Bathroom Shower/Tub: Tub/shower unit Bathroom Toilet: Handicapped height Bathroom Accessibility: Yes How Accessible: Accessible via walker Home Care Services: No Additional Comments: Information is per pt.  Discharge Living Setting Plans for Discharge Living Setting: Lives with (comment) (daughter) Type of Home at Discharge: House Discharge Home Layout: Able to live on main level with bedroom/bathroom Discharge Home Access: Ramped entrance, Stairs to enter Entrance Stairs-Rails: None Entrance Stairs-Number of Steps: 3 Discharge Bathroom Shower/Tub: Tub/shower unit Discharge Bathroom Toilet: Handicapped height Discharge Bathroom Accessibility: Yes How Accessible: Accessible via walker Does the patient have any problems obtaining your medications?: No  Social/Family/Support Systems Anticipated Caregiver: daughter, Ruth Webb Anticipated Caregiver's Contact Information: [phone] (c); [phone] (h) Ability/Limitations of Caregiver: min assist Caregiver Availability: 24/7 Discharge Plan Discussed with Primary Caregiver: Yes Is Caregiver In Agreement with Plan?: Yes Does Caregiver/Family have Issues with Lodging/Transportation while Pt is in Rehab?: No  Goals Patient/Family Goal for Rehab: PT/OT min assist to supervision, SLP n/a Expected length of stay: 21-24 days Additional Information: discharge plan: return to pt's home where he lives with his daughter, she provides 24/7 at baseline Pt/Family  Agrees to Admission and willing to participate: Yes Program Orientation Provided & Reviewed with Pt/Caregiver Including Roles  & Responsibilities: Yes  Barriers to Discharge: Home environment access/layout  Decrease burden of Care through IP rehab admission: n/a  Possible need for SNF placement upon discharge: not anticipated.  Plan discharge home with daughter, 24/7, as prior to admit.    Patient Condition: I have reviewed medical records from [facility], spoken with CM, and patient and daughter. I met with patient at the bedside and discussed via phone for inpatient rehabilitation assessment.  Patient will benefit from ongoing PT and OT, can actively participate in 3 hours of therapy a day 5 days of the week, and can make measurable gains during the admission.  Patient will also benefit from the coordinated team approach during an Inpatient Acute Rehabilitation admission.  The patient will receive intensive therapy as well as Rehabilitation physician, nursing, social worker, and care management interventions.  Due to safety, skin/wound care, disease management, medication administration, pain management, and patient education the patient requires 24 hour a day rehabilitation nursing.  The patient is currently mod to max assist with mobility and basic ADLs.  Discharge setting and therapy post discharge at home with home health is anticipated.  Patient has agreed to participate in the Acute Inpatient Rehabilitation Program and will admit today.  Preadmission Screen Completed By:  Tru Leopard E Fynn Adel, PT, DPT  09/10/2022 12:52 PM ______________________________________________________________________   Discussed status with Dr. Swartz on 09/10/22  at 1:03 PM  and received approval for admission today.  Admission Coordinator:  Renea Schoonmaker E Deivi Huckins, PT, DPT time 1:03 PM /Date 09/10/22    Assessment/Plan: Diagnosis: debility  after septicemia from osteomyelitis of right 5th toe.  Does the need for close, 24  hr/day Medical supervision in concert with the patient's rehab needs make it unreasonable for this patient to be served in a less intensive setting? Yes Co-Morbidities requiring supervision/potential complications: HTN, PAD right 5th ray amp Due to bladder management, bowel management, safety, skin/wound care, disease management, medication administration, pain management, and patient education, does the patient require 24 hr/day rehab nursing? Yes Does the patient require coordinated care of a physician, rehab nurse, PT, OT, to address physical and functional   deficits in the context of the above medical diagnosis(es)? Yes Addressing deficits in the following areas: balance, endurance, locomotion, strength, transferring, bowel/bladder control, bathing, dressing, feeding, grooming, toileting, and psychosocial support Can the patient actively participate in an intensive therapy program of at least 3 hrs of therapy 5 days a week? Yes The potential for patient to make measurable gains while on inpatient rehab is excellent Anticipated functional outcomes upon discharge from inpatient rehab: supervision and min assist PT, supervision and min assist OT, n/a SLP Estimated rehab length of stay to reach the above functional goals is: 21-24 days Anticipated discharge destination: Home 10. Overall Rehab/Functional Prognosis: excellent   MD Signature: Zachary T. Swartz, MD, FAAPMR [facility] Medical Director Rehabilitation Services 09/10/2022  

## 2022-09-10 NOTE — TOC Transition Note (Signed)
Transition of Care (TOC) - CM/SW Discharge Note Donn Pierini RN, BSN Transitions of Care Unit 4E- RN Case Manager See Treatment Team for direct phone #   Patient Details  Name: Jesse MACISAAC Sr. MRN: [id number] Date of Birth: 1928-11-30  Transition of Care [facility]) CM/SW Contact:  Darrold Span, RN Phone Number: 09/10/2022, 3:26 PM   Clinical Narrative:    INPT rehab liaison has reviewed pt and spoken with pt/daughter- [facility] CIR offering bed today for admission to INPT rehab.  MD has cleared pt for transition to INPT rehab and pt/daughter agreeable.   Plan- pt to transition later today to [facility] INPT rehab.    Final next level of care: IP Rehab Facility Barriers to Discharge: Barriers Resolved   Patient Goals and CMS Choice CMS Medicare.gov Compare Post Acute Care list provided to:: Patient Represenative (must comment) Choice offered to / list presented to : Adult Children  Discharge Placement               [facility] INPT rehab          Discharge Plan and Services Additional resources added to the After Visit Summary for   In-house Referral: Clinical Social Work Discharge Planning Services: CM Consult Post Acute Care Choice: Skilled Nursing Facility                               Social Determinants of Health (SDOH) Interventions SDOH Screenings   Food Insecurity: No Food Insecurity (08/25/2022)  Housing: Low Risk  (08/25/2022)  Transportation Needs: No Transportation Needs (08/25/2022)  Utilities: Not At Risk (08/25/2022)  Alcohol Screen: Low Risk  (07/24/2019)  Depression (PHQ2-9): Low Risk  (07/24/2019)  Financial Resource Strain: Low Risk  (07/24/2019)  Physical Activity: Inactive (07/24/2019)  Social Connections: Moderately Integrated (07/24/2019)  Stress: No Stress Concern Present (07/24/2019)  Tobacco Use: Low Risk  (09/09/2022)     Readmission Risk Interventions     No data to display

## 2022-09-10 NOTE — Progress Notes (Signed)
Inpatient Rehabilitation Admission Medication Review by a Pharmacist  A complete drug regimen review was completed for this patient to identify any potential clinically significant medication issues.  High Risk Drug Classes Is patient taking? Indication by Medication  Antipsychotic Yes Compazine-nausea/vomiting  Anticoagulant Yes Apixaban-afib  Antibiotic Yes Augmentin, Doxycycline-Bacteremia/OM  Opioid Yes Oxycodone-pain  Antiplatelet Yes Aspirin-AFib  Hypoglycemics/insulin No   Vasoactive Medication Yes Lasix-edema Tamsulosin-BPH  Chemotherapy No   Other Yes Vitamin C, Zinc, Medihoney-wound care Atorvastatin, Lovaza-HLD Vitamin B12-suuplement Docusate, Senna docusate, biscodyl-constipation Pantoprazole-GERD Diphenhydramine-itching Guaifenesin/DM-cough     Type of Medication Issue Identified Description of Issue Recommendation(s)  Drug Interaction(s) (clinically significant)     Duplicate Therapy     Allergy     No Medication Administration End Date     Incorrect Dose     Additional Drug Therapy Needed     Significant med changes from prior encounter (inform family/care partners about these prior to discharge). Alendronate, benazepril PTA Please evaluate the need for these agents on discharge from CIR  Other       Clinically significant medication issues were identified that warrant physician communication and completion of prescribed/recommended actions by midnight of the next day:  No  Name of provider notified for urgent issues identified:   Provider Method of Notification:     Pharmacist comments:   Time spent performing this drug regimen review (minutes):  20  Ikran Patman A. Jeanella Craze, PharmD, BCPS, FNKF Clinical Pharmacist [place] Please utilize Amion for appropriate phone number to reach the unit pharmacist [facility])  09/10/2022 6:17 PM

## 2022-09-10 NOTE — PMR Pre-admission (Signed)
PMR Admission Coordinator Pre-Admission Assessment  Patient: [name]. is an 89 y.o., male MRN: [id number] DOB: 15-Mar-1929 Height: 5\' 7"  (170.2 cm) Weight: 82.1 kg  Insurance Information HMO:     PPO:      PCP:      IPA:      80/20:      OTHER:  PRIMARY: Medicare A/B      Policy#: [id number]      Subscriber: pt CM Name:       Phone#:      Fax#:  Pre-Cert#: verified Health and safety inspector:  Benefits:  Phone #:      Name:  Eff. Date: A/B 04/20/95     Deduct: $1632      Out of Pocket Max: n/a      Life Max: n/a CIR: 100%      SNF: 20 full days Outpatient: 80%     Co-Pay: 20% Home Health: 100%      Co-Pay:  DME: 80%     Co-Pay: 20% Providers:  SECONDARYRaphael Gibney of [place]      Policy#: [id number]     Phone#: [phone]  Financial Counselor:       Phone#:   The "Data Collection Information Summary" for patients in Inpatient Rehabilitation Facilities with attached "Privacy Act Statement-Health Care Records" was provided and verbally reviewed with: Patient and Family  Emergency Contact Information Contact Information     Name Relation Home Work Mobile   [place] Daughter [phone]  [phone]       Current Medical History  Patient Admitting Diagnosis: debility   History of Present Illness: Pt is a 89 y/o male with PMH of HTN, BPH, and prostate cancer, admitted to [facility] on 08/24/22 with progressive weakness.  In ED he became febrile 100.9, SBP 178, labs showed BUN 27, BNP 160, respiratory panel negative.  CTA chest showed no PE but did show small bilateral pleural effusions.  CT abd/pelvis showed no abnormality.  LE xrays negative except ulceration of the skin lateral to the fifth MTP joint.  CXR negative.  Pt started on empiric abx.  Blood work showed gram positive bacteremia with source of infection felt to be right foot.  He was transferred to [facility] for treatment.  Infectious Disease recommended vancomycin and metronidazole and repeat cultures as well as monitoring  renal function.  MRI completed which showed osteomyelitis of fifth met head.  Consults for vascular for evaluation of circulation and ortho for amputation.  Pt underwent angiogram with balloon angioplasty of right tibioperoneal trunk and right posterior tibial artery on 5/16 per Dr. Karin Lieu.  Post op course complicated by new onset afib and cardiology consulted.  Recommendations for anticoagulation for stroke prevention.  On 5/22 he underwent a right fifth ray amp per Dr. Lajoyce Corners, post op TDWB.  Therapy evaluations completed.  MD requested CIR.      Patient's medical record from [facility] has been reviewed by the rehabilitation admission coordinator and physician.  Past Medical History  Past Medical History:  Diagnosis Date   Hypertension    Kidney calculus 2014;2013   Pneumonia    Prostate cancer (HCC)    Shortness of breath     Has the patient had major surgery during 100 days prior to admission? Yes  Family History   family history is not on file.  Current Medications  Current Facility-Administered Medications:    0.9 %  sodium chloride infusion, 250 mL, Intravenous, PRN, [name],  MD   0.9 %  sodium chloride infusion, , Intravenous, Continuous, [name], MD, Last Rate: 75 mL/hr at 09/08/22 1854, Infusion Verify at 09/08/22 1854   acetaminophen (TYLENOL) tablet 325-650 mg, 325-650 mg, Oral, Q6H PRN, [name], MD, 650 mg at 09/08/22 1408   alum & mag hydroxide-simeth (MAALOX/MYLANTA) 200-200-20 MG/5ML suspension 15-30 mL, 15-30 mL, Oral, Q2H PRN, [name], MD   amoxicillin-clavulanate (AUGMENTIN) 875-125 MG per tablet 1 tablet, 1 tablet, Oral, Q12H, Kathlynn Grate, DO, 1 tablet at 09/10/22 0929   apixaban (ELIQUIS) tablet 5 mg, 5 mg, Oral, BID, Reome, Earle J, RPH, 5 mg at 09/10/22 0930   ascorbic acid (VITAMIN C) tablet 1,000 mg, 1,000 mg, Oral, Daily, [name], MD, 1,000 mg at 09/10/22 4401   aspirin EC tablet 81 mg, 81 mg, Oral, Daily, [name],  MD, 81 mg at 09/10/22 0930   atorvastatin (LIPITOR) tablet 40 mg, 40 mg, Oral, Daily, [name], MD, 40 mg at 09/10/22 0272   bisacodyl (DULCOLAX) EC tablet 5 mg, 5 mg, Oral, Daily PRN, [name], MD   cyanocobalamin (VITAMIN B12) injection 1,000 mcg, 1,000 mcg, Intramuscular, Daily, [name], MD, 1,000 mcg at 09/10/22 0930   dextromethorphan-guaiFENesin (MUCINEX DM) 30-600 MG per 12 hr tablet 1 tablet, 1 tablet, Oral, BID, [name], MD, 1 tablet at 09/10/22 0930   diphenhydrAMINE (BENADRYL) injection 25 mg, 25 mg, Intravenous, Once PRN, [name], MD   docusate sodium (COLACE) capsule 100 mg, 100 mg, Oral, Daily, [name], MD, 100 mg at 09/10/22 5366   doxycycline (VIBRA-TABS) tablet 100 mg, 100 mg, Oral, Q12H, Kathlynn Grate, DO, 100 mg at 09/10/22 0930   EPINEPHrine (EPI-PEN) injection 0.3 mg, 0.3 mg, Intramuscular, Once PRN, [name], MD   furosemide (LASIX) injection 20 mg, 20 mg, Intravenous, Daily, [name], MD, 20 mg at 09/10/22 4403   guaiFENesin-dextromethorphan (ROBITUSSIN DM) 100-10 MG/5ML syrup 15 mL, 15 mL, Oral, Q4H PRN, [name], MD   hydrALAZINE (APRESOLINE) injection 5 mg, 5 mg, Intravenous, Q20 Min PRN, [name], MD   HYDROmorphone (DILAUDID) injection 0.5-1 mg, 0.5-1 mg, Intravenous, Q4H PRN, [name], MD   ipratropium-albuterol (DUONEB) 0.5-2.5 (3) MG/3ML nebulizer solution 3 mL, 3 mL, Nebulization, Q4H PRN, [name], MD   labetalol (NORMODYNE) injection 10 mg, 10 mg, Intravenous, Q10 min PRN, [name], MD   leptospermum manuka honey (MEDIHONEY) paste 1 Application, 1 Application, Topical, Daily, [name], MD, 1 Application at 09/10/22 0931   magnesium citrate solution 1 Bottle, 1 Bottle, Oral, Once PRN, [name], MD   magnesium sulfate IVPB 2 g 50 mL, 2 g, Intravenous, Daily PRN, [name], MD   metoprolol tartrate (LOPRESSOR) injection 2-5 mg, 2-5 mg, Intravenous, Q2H PRN, [name], MD   multivitamin with minerals tablet 1 tablet, 1 tablet, Oral, Daily, [name], MD, 1 tablet at 09/10/22 0930   nutrition supplement (JUVEN) (JUVEN) powder packet 1 packet, 1 packet, Oral, BID BM, [name], MD, 1 packet at 09/10/22 0930   omega-3 acid ethyl esters (LOVAZA) capsule 1 g, 1 g, Oral, Daily, [name], MD, 1 g at 09/10/22 0929   ondansetron (ZOFRAN) injection 4 mg, 4 mg, Intravenous, Q6H PRN, [name], MD   ondansetron [facility]) tablet 4 mg, 4 mg, Oral, Q6H PRN **OR** [DISCONTINUED] ondansetron (ZOFRAN) injection 4 mg, 4  mg, Intravenous, Q6H PRN, Adefeso, Oladapo, DO, 4 mg at 08/27/22 2053   oxyCODONE (Oxy IR/ROXICODONE) immediate release tablet 10-15 mg, 10-15 mg, Oral, Q4H PRN, [name], MD   oxyCODONE (Oxy IR/ROXICODONE) immediate release tablet 5-10 mg, 5-10 mg, Oral, Q4H PRN, [name], MD   pantoprazole (PROTONIX) EC tablet 40 mg, 40 mg, Oral, Daily, [name], MD, 40 mg at 09/10/22 0925   phenol (CHLORASEPTIC) mouth spray 1 spray, 1 spray, Mouth/Throat, PRN, [name], MD   polyethylene glycol (MIRALAX / GLYCOLAX) packet 17 g, 17 g, Oral, Daily PRN, [name], MD, 17 g at 09/06/22 1732   potassium chloride SA (KLOR-CON M) CR tablet 20-40 mEq, 20-40 mEq, Oral, Daily PRN, [name], MD   senna-docusate (Senokot-S) tablet 1 tablet, 1 tablet, Oral, BID, [name], MD, 1 tablet at 09/10/22 0931   sodium chloride flush (NS) 0.9 % injection 3 mL, 3 mL, Intravenous, Q12H, [name], MD, 3 mL at 09/09/22 0831   sodium chloride flush (NS) 0.9 % injection 3 mL, 3 mL, Intravenous, PRN, [name], MD   tamsulosin [facility]) capsule 0.4 mg, 0.4 mg, Oral, QPC supper, [name], MD, 0.4 mg at 09/09/22 1739   zinc sulfate capsule 220 mg, 220 mg, Oral, Daily, [name], MD, 220 mg at 09/10/22 0981  Patients Current Diet:  Diet Order             Diet Carb Modified Fluid consistency: Thin; Room service  appropriate? Yes  Diet effective now                   Precautions / Restrictions Precautions Precautions: Fall Precaution Comments: L heel, R buttocks pressure injuries; HoH Other Brace: R post op shoe Restrictions Weight Bearing Restrictions: Yes RLE Weight Bearing: Touchdown weight bearing Other Position/Activity Restrictions: HOH   Has the patient had 2 or more falls or a fall with injury in the past year? Yes  Prior Activity Level Household: household ambulatory with RW supervision level, w/c in community, supervision/assist for ADLs, assist for iADLs  Prior Functional Level Self Care: Did the patient need help bathing, dressing, using the toilet or eating? Needed some help  Indoor Mobility: Did the patient need assistance with walking from room to room (with or without device)? Needed some help  Stairs: Did the patient need assistance with internal or external stairs (with or without device)? Needed some help  Functional Cognition: Did the patient need help planning regular tasks such as shopping or remembering to take medications? Dependent  Patient Information Are you of Hispanic, Latino/a,or Spanish origin?: A. No, not of Hispanic, Latino/a, or Spanish origin What is your race?: B. Black or African American Do you need or want an interpreter to communicate with a doctor or health care staff?: 0. No  Patient's Response To:  Health Literacy and Transportation Is the patient able to respond to health literacy and transportation needs?: Yes Health Literacy - How often do you need to have someone help you when you read instructions, pamphlets, or other written material from your doctor or pharmacy?: Never In the past 12 months, has lack of transportation kept you from medical appointments or from getting medications?: No In the past 12 months, has lack of transportation kept you from meetings, work, or from getting things needed for daily living?: No  Home Assistive  Devices / Equipment Home Assistive Devices/Equipment: Environmental consultant (specify type), Cane (specify quad or straight)  Home Equipment: Agricultural consultant (2 wheels), [employer] - single point, BSC/3in1, Information systems manager, Wheelchair - manual  Prior Device Use: Indicate devices/aids used by the patient prior to current illness, exacerbation or injury? Walker  Current Functional Level Cognition  Overall Cognitive Status: Within Functional Limits for tasks assessed Difficult to assess due to: Hard of hearing/deaf Orientation Level: Oriented X4 General Comments: difficult to assess due to [facility], cousin in room and reports his memory is good    Extremity Assessment (includes Sensation/Coordination)  Upper Extremity Assessment: Generalized weakness  Lower Extremity Assessment: Defer to PT evaluation    ADLs  Overall ADL's : Needs assistance/impaired Eating/Feeding: Modified independent Grooming: Min guard, Sitting Upper Body Bathing: Minimal assistance, Sitting, Min guard Lower Body Bathing: Maximal assistance, Sitting/lateral leans Upper Body Dressing : Minimal assistance, Sitting Upper Body Dressing Details (indicate cue type and reason): gown change Lower Body Dressing: Total assistance, Bed level Lower Body Dressing Details (indicate cue type and reason): post op shoe Toilet Transfer: Moderate assistance, +2 for physical assistance, BSC/3in1 Toilet Transfer Details (indicate cue type and reason): stedy used for transfer to [facility] Toileting- Clothing Manipulation and Hygiene: Total assistance, Sit to/from stand Toileting - Clothing Manipulation Details (indicate cue type and reason): patient stood in [place] for toilet hygiene with assistance of another to assist with balance Functional mobility during ADLs: Maximal assistance, Rolling walker (2 wheels) General ADL Comments: educated in WB status on R foot    Mobility  Overal bed mobility: Needs Assistance Bed Mobility: Supine to Sit Rolling: Min assist Sidelying  to sit: Mod assist, +2 for safety/equipment, HOB elevated Supine to sit: +2 for physical assistance, Mod assist, HOB elevated Sit to supine: +2 for physical assistance, Mod assist General bed mobility comments: cues for sequencing, assist for LEs over EOB, especially R LE, to raise trunk and for hips to EOB with bed pad, guided trunk and assisted LEs back into bed    Transfers  Overall transfer level: Needs assistance Equipment used: Rolling walker (2 wheels) Transfers: Sit to/from Stand Sit to Stand: +2 physical assistance, Max assist, Mod assist, From elevated surface Bed to/from chair/wheelchair/BSC transfer type:: Via Lift equipment Step pivot transfers: Mod assist, Max assist Transfer via Lift Equipment: [employer] transfer comment: cues for hand placement, assist to rise and steady, requiring more assist second trial with increased posterior lean, pt able to maintain minimal weigh on R LE in static standing but uinable to progress to stepping without increasing wt through RLE    Ambulation / Gait / Stairs / Wheelchair Mobility  Ambulation/Gait Ambulation/Gait assistance: Max Chemical engineer (Feet): 4 Feet Assistive device: Rolling walker (2 wheels) Gait Pattern/deviations: Decreased step length - right, Decreased step length - left, Decreased stride length, Shuffle, Trunk flexed General Gait Details: pt unable Gait velocity: slow    Posture / Balance Dynamic Sitting Balance Sitting balance - Comments: posterior bias, but no LOB Balance Overall balance assessment: Needs assistance Sitting-balance support: Bilateral upper extremity supported, Feet supported Sitting balance-Leahy Scale: Fair Sitting balance - Comments: posterior bias, but no LOB Postural control: Posterior lean Standing balance support: Bilateral upper extremity supported, During functional activity, Reliant on assistive device for balance Standing balance-Leahy Scale: Poor Standing balance comment:  dependent on BUE support and modA of 2 to maintain upright, posterior lean    Special needs/care consideration Wound Vac R foot and Skin surgical incision R foot   Previous Home Environment (from acute therapy documentation) Living Arrangements: Children Available Help at Discharge: Family, Available  PRN/intermittently, Other (Comment) Type of Home: House Home Layout: Able to live on main level with bedroom/bathroom, Laundry or work area in basement Home Access: Ramped entrance Bathroom Shower/Tub: Engineer, manufacturing systems: Handicapped height Bathroom Accessibility: Yes How Accessible: Accessible via walker Home Care Services: No Additional Comments: Information is per pt.  Discharge Living Setting Plans for Discharge Living Setting: Lives with (comment) (daughter) Type of Home at Discharge: House Discharge Home Layout: Able to live on main level with bedroom/bathroom Discharge Home Access: Ramped entrance, Stairs to enter Entrance Stairs-Rails: None Entrance Stairs-Number of Steps: 3 Discharge Bathroom Shower/Tub: Tub/shower unit Discharge Bathroom Toilet: Handicapped height Discharge Bathroom Accessibility: Yes How Accessible: Accessible via walker Does the patient have any problems obtaining your medications?: No  Social/Family/Support Systems Anticipated Caregiver: daughter, Krystal Eaton Anticipated Caregiver's Contact Information: [phone] (c); [phone] (h) Ability/Limitations of Caregiver: min assist Caregiver Availability: 24/7 Discharge Plan Discussed with Primary Caregiver: Yes Is Caregiver In Agreement with Plan?: Yes Does Caregiver/Family have Issues with Lodging/Transportation while Pt is in Rehab?: No  Goals Patient/Family Goal for Rehab: PT/OT min assist to supervision, SLP n/a Expected length of stay: 21-24 days Additional Information: discharge plan: return to pt's home where he lives with his daughter, she provides 24/7 at baseline Pt/Family  Agrees to Admission and willing to participate: Yes Program Orientation Provided & Reviewed with Pt/Caregiver Including Roles  & Responsibilities: Yes  Barriers to Discharge: Home environment access/layout  Decrease burden of Care through IP rehab admission: n/a  Possible need for SNF placement upon discharge: not anticipated.  Plan discharge home with daughter, 24/7, as prior to admit.    Patient Condition: I have reviewed medical records from [facility], spoken with CM, and patient and daughter. I met with patient at the bedside and discussed via phone for inpatient rehabilitation assessment.  Patient will benefit from ongoing PT and OT, can actively participate in 3 hours of therapy a day 5 days of the week, and can make measurable gains during the admission.  Patient will also benefit from the coordinated team approach during an Inpatient Acute Rehabilitation admission.  The patient will receive intensive therapy as well as Rehabilitation physician, nursing, social worker, and care management interventions.  Due to safety, skin/wound care, disease management, medication administration, pain management, and patient education the patient requires 24 hour a day rehabilitation nursing.  The patient is currently mod to max assist with mobility and basic ADLs.  Discharge setting and therapy post discharge at home with home health is anticipated.  Patient has agreed to participate in the Acute Inpatient Rehabilitation Program and will admit today.  Preadmission Screen Completed By:  Stephania Fragmin, PT, DPT  09/10/2022 12:52 PM ______________________________________________________________________   Discussed status with Dr. Riley Kill on 09/10/22  at 1:03 PM  and received approval for admission today.  Admission Coordinator:  Stephania Fragmin, PT, DPT time 1:03 PM Dorna Bloom 09/10/22    Assessment/Plan: Diagnosis: debility  after septicemia from osteomyelitis of right 5th toe.  Does the need for close, 24  hr/day Medical supervision in concert with the patient's rehab needs make it unreasonable for this patient to be served in a less intensive setting? Yes Co-Morbidities requiring supervision/potential complications: HTN, PAD right 5th ray amp Due to bladder management, bowel management, safety, skin/wound care, disease management, medication administration, pain management, and patient education, does the patient require 24 hr/day rehab nursing? Yes Does the patient require coordinated care of a physician, rehab nurse, PT, OT, to address physical and functional  deficits in the context of the above medical diagnosis(es)? Yes Addressing deficits in the following areas: balance, endurance, locomotion, strength, transferring, bowel/bladder control, bathing, dressing, feeding, grooming, toileting, and psychosocial support Can the patient actively participate in an intensive therapy program of at least 3 hrs of therapy 5 days a week? Yes The potential for patient to make measurable gains while on inpatient rehab is excellent Anticipated functional outcomes upon discharge from inpatient rehab: supervision and min assist PT, supervision and min assist OT, n/a SLP Estimated rehab length of stay to reach the above functional goals is: 21-24 days Anticipated discharge destination: Home 10. Overall Rehab/Functional Prognosis: excellent   MD Signature: Ranelle Oyster, MD, [facility] Physical Medicine & Rehabilitation Medical Director Rehabilitation Services 09/10/2022

## 2022-09-10 NOTE — H&P (Addendum)
Physical Medicine and Rehabilitation Admission H&P        Chief Complaint  Patient presents with   Debility      HPI:  [name] is a 89 year old male with history of HTN, prostate cancer (Dr. Retta Diones), 4 week history of BLE weakness progressing to difficulty walking, SOB with activity, ulcer on right foot as well as developing sacral ulcer. He was admitted for work up on 05/07 and became febrile in ED,developed rigors, lactic acidosis and was started on IVF as well as broad spectrum antibiotics. He was noted to have A fib with bradycardic ventricular response. CTA chest negative for PE and CT abdomen/pelvis showed moderately distended bladder as well as stool in distal rectum with rectal distension 6.7 cm seen w/fecal impaction.   He was found to have undetectable Vitamin B 12 level w/neuropathy and started on supplementation.    He developed peripheral edema therefore IVF/Lasix d/c.  2 D echo showed EF 65-70% with mild LVH and degenerative MV with mid MVR.  He had sudden rise in WBC the next day to 19.8 but failed attempts at MRI of foot due to excessive movement. Blood cultures X 2 positive for Aerococcus Urinae and 1+ for actinotignum Schaalii. CT foot showed possible osteomyelitis of 5th MTH and antibiotics narrowed to Rocephin on 05/13.  MRI foot done revealing concerwsn of acute osteomyelitis of right great toe MTH as well as mild edema plantar mid foot felt to be nonspecific myositis. Dr. Myra Gianotti and Dr. Lajoyce Corners consulted for input. He was noted to have decreased ABI RLE and patient underwent balloon angioplasty of posterior tibial artery and tibioperoneal trunk on 05/16 by Dr. Karin Lieu.    Dr. Rosemary Holms recommended DOAC when cleared from surgeries. Antibiotics held prior ot surgery and he underwent right 5th toe Ray amputation on 09/08/22 by Dr. Lajoyce Corners and post op to be TDWB on RLE. He was transitioned from IV heparin to Eliquis on 05/23.  He has completed antibiotic regimen for bacteremia  most likely from urinary source and as post amputation; ID recommends 2 weeks of empiric Doxycyline (with esophagitis precautions) and Augmentin "to mop up any residual infection". Leucocytosis has resolved and ABLA stable. PT/OT has been working with patient who has difficulty maintaining TDWB on RLE, has strong posterior lean on standing as well as weakness. CIR recommended due to functional decline.   .    Review of Systems  Constitutional:  Negative for chills and fever.  HENT:  Positive for hearing loss.   Cardiovascular:  Negative for chest pain, palpitations and leg swelling.  Gastrointestinal:  Negative for constipation.  Genitourinary:  Negative for dysuria.  Skin:  Negative for rash.  Neurological:  Positive for weakness.  Psychiatric/Behavioral:  The patient does not have insomnia.           Past Medical History:  Diagnosis Date   Hypertension     Kidney calculus 2014;2013   Pneumonia     Prostate cancer [facility])     Shortness of breath             Past Surgical History:  Procedure Laterality Date   AMPUTATION Right 09/08/2022    Procedure: RIGHT FOOT FIFTH RAY AMPUTATION;  Surgeon: [name], MD;  Location: [facility] OR;  Service: Orthopedics;  Laterality: Right;   CATARACT EXTRACTION W/PHACO Right 02/13/2013    Procedure: CATARACT EXTRACTION RIGHT EYE (WITH PHACO) AND INTRAOCULAR LENS PLACEMENT  CDE=14.53;  Surgeon: Loraine Leriche T. Nile Riggs, MD;  Location: [facility]  ORS;  Service: Ophthalmology;  Laterality: Right;   CATARACT EXTRACTION W/PHACO Left 02/27/2013    Procedure: CATARACT EXTRACTION PHACO AND INTRAOCULAR LENS PLACEMENT (IOC);  Surgeon: Loraine Leriche T. Nile Riggs, MD;  Location: [facility] ORS;  Service: Ophthalmology;  Laterality: Left;  CDE:12.54   COLONOSCOPY       JOINT REPLACEMENT Right 1993;1980    Right x2      History reviewed. No pertinent family history.     Social History: Lives with daughter (who's retired). Independent with walker for household distances and use wheelchair for  longer distances.  Required assistance with ADLs. Per  reports that he has never smoked. He has never used smokeless tobacco. He reports that he does not drink alcohol and does not use drugs.         Allergies  Allergen Reactions   Codeine Swelling            Medications Prior to Admission  Medication Sig Dispense Refill   alendronate (FOSAMAX) 70 MG tablet TAKE 1 TABLET EVERY WEEK IN THE MORNING 30 MIN BEFORE EATING WITH AN 8OZ GLASS OF WATER (SIT UP 30 MIN) 4 tablet 11   aspirin EC 81 MG tablet Take 81 mg by mouth daily.       benazepril (LOTENSIN) 10 MG tablet Take 10 mg by mouth daily.       fish oil-omega-3 fatty acids 1000 MG capsule Take 1 g by mouth daily.       hydrochlorothiazide (HYDRODIURIL) 25 MG tablet Take 25 mg by mouth daily.       tamsulosin (FLOMAX) 0.4 MG CAPS capsule Take 0.4 mg by mouth daily after supper.       furosemide (LASIX) 20 MG tablet Take 20-40 mg by mouth daily as needed for fluid.  (Patient not taking: Reported on 08/25/2022)          Home: Home Living Family/patient expects to be discharged to:: Private residence Living Arrangements: Children Available Help at Discharge: Family, Available PRN/intermittently, Other (Comment) Type of Home: House Home Access: Ramped entrance Home Layout: Able to live on main level with bedroom/bathroom, Laundry or work area in basement [employer] Shower/Tub: Engineer, manufacturing systems: Handicapped height Bathroom Accessibility: Yes Home Equipment: Agricultural consultant (2 wheels), [employer] - single point, BSC/3in1, Information systems manager, Wheelchair - manual Additional Comments: Information is per pt.   Functional History: Prior Function Prior Level of Function : Needs assist Physical Assist : ADLs (physical), Mobility (physical) Mobility (physical): Bed mobility, Transfers, Gait ADLs (physical): Bathing, Dressing, Toileting, IADLs, Grooming Mobility Comments: Supervised household ambulator using RW, uses wheelchair for longer  distances ADLs Comments: Assist for bathing, dressing, toileting, and grooming. Able to feed self. Assist IADL's.   Functional Status:  Mobility: Bed Mobility Overal bed mobility: Needs Assistance Bed Mobility: Supine to Sit Rolling: Min assist Sidelying to sit: Mod assist, +2 for safety/equipment, HOB elevated Supine to sit: +2 for physical assistance, Mod assist, HOB elevated Sit to supine: +2 for physical assistance, Mod assist General bed mobility comments: cues for sequencing, assist for LEs over EOB, especially R LE, to raise trunk and for hips to EOB with bed pad, guided trunk and assisted LEs back into bed Transfers Overall transfer level: Needs assistance Equipment used: Rolling walker (2 wheels) Transfers: Sit to/from Stand Sit to Stand: +2 physical assistance, Max assist, Mod assist, From elevated surface Bed to/from chair/wheelchair/BSC transfer type:: Via Lift equipment Step pivot transfers: Mod assist, Max assist Transfer via Lift Equipment: [employer] transfer comment: cues for  hand placement, assist to rise and steady, requiring more assist second trial with increased posterior lean, pt able to maintain minimal weigh on R LE in static standing but uinable to progress to stepping without increasing wt through RLE Ambulation/Gait Ambulation/Gait assistance: Max assist Gait Distance (Feet): 4 Feet Assistive device: Rolling walker (2 wheels) Gait Pattern/deviations: Decreased step length - right, Decreased step length - left, Decreased stride length, Shuffle, Trunk flexed General Gait Details: pt unable Gait velocity: slow   ADL: ADL Overall ADL's : Needs assistance/impaired Eating/Feeding: Modified independent Grooming: Min guard, Sitting Upper Body Bathing: Minimal assistance, Sitting, Min guard Lower Body Bathing: Maximal assistance, Sitting/lateral leans Upper Body Dressing : Minimal assistance, Sitting Upper Body Dressing Details (indicate cue type and  reason): gown change Lower Body Dressing: Total assistance, Bed level Lower Body Dressing Details (indicate cue type and reason): post op shoe Toilet Transfer: Moderate assistance, +2 for physical assistance, BSC/3in1 Toilet Transfer Details (indicate cue type and reason): stedy used for transfer to [facility] Toileting- Clothing Manipulation and Hygiene: Total assistance, Sit to/from stand Toileting - Clothing Manipulation Details (indicate cue type and reason): patient stood in [place] for toilet hygiene with assistance of another to assist with balance Functional mobility during ADLs: Maximal assistance, Rolling walker (2 wheels) General ADL Comments: educated in WB status on R foot   Cognition: Cognition Overall Cognitive Status: Within Functional Limits for tasks assessed Orientation Level: Oriented X4 Cognition Arousal/Alertness: Awake/alert Behavior During Therapy: WFL for tasks assessed/performed Overall Cognitive Status: Within Functional Limits for tasks assessed General Comments: difficult to assess due to [facility], cousin in room and reports his memory is good Difficult to assess due to: Hard of hearing/deaf     Blood pressure (!) 158/64, pulse 60, temperature 97.7 F (36.5 C), temperature source Oral, resp. rate 18, height 5\' 7"  (1.702 m), weight 82.1 kg, SpO2 100 %. Physical Exam Vitals and nursing note reviewed.  Constitutional:      Appearance: Normal appearance.     Comments: Appears younger than state age.   HENT:     Head: Normocephalic.     Right Ear: External ear normal.     Left Ear: External ear normal.     Nose: Nose normal.  Eyes:     Conjunctiva/sclera: Conjunctivae normal.  Cardiovascular:     Rate and Rhythm: Normal rate and regular rhythm.     Heart sounds: No murmur heard.    No gallop.  Pulmonary:     Effort: Pulmonary effort is normal. No respiratory distress.     Breath sounds: No wheezing or rales.  Abdominal:     General: There is no distension.      Palpations: Abdomen is soft.     Tenderness: There is no abdominal tenderness.  Musculoskeletal:        General: Tenderness (RLE) present.     Cervical back: Normal range of motion.     Right lower leg: Edema present.  Skin:    Comments: Foam dressing with underlying gauze on left heel--left heel ulcerated, yellow drainage on dressing. Right foot with wound VAC and ACE. Right buttocks wound with dressing Neurological:     Mental Status: He is alert.     Comments: Alert and oriented x 3. Normal insight and awareness. Intact Memory. Normal language and speech. Cranial nerve exam unremarkable although he is HOH. MMT: 5/5 BUE, RLE limited by vac. LLE 3/5 prox to 4/5 distally. Sensory exam decreased to light touch in distal LE's.  No limb  ataxia or cerebellar signs. No abnormal tone appreciated.  Marland Kitchen    Psychiatric:        Mood and Affect: Mood normal.        Behavior: Behavior normal.        Lab Results Last 48 Hours        Results for orders placed or performed during the hospital encounter of 08/24/22 (from the past 48 hour(s))  Basic metabolic panel     Status: Abnormal    Collection Time: 09/09/22  1:24 AM  Result Value Ref Range    Sodium 133 (L) 135 - 145 mmol/L    Potassium 4.3 3.5 - 5.1 mmol/L    Chloride 99 98 - 111 mmol/L    CO2 24 22 - 32 mmol/L    Glucose, Bld 127 (H) 70 - 99 mg/dL      Comment: Glucose reference range applies only to samples taken after fasting for at least 8 hours.    BUN 28 (H) 8 - 23 mg/dL    Creatinine, Ser 9.56 0.61 - 1.24 mg/dL    Calcium 8.3 (L) 8.9 - 10.3 mg/dL    GFR, Estimated >21 >30 mL/min      Comment: (NOTE) Calculated using the CKD-EPI Creatinine Equation (2021)      Anion gap 10 5 - 15      Comment: Performed at [facility], [address] [place], [place] [address]  CBC     Status: Abnormal    Collection Time: 09/09/22  1:24 AM  Result Value Ref Range    WBC 5.3 4.0 - 10.5 K/uL    RBC 2.20 (L) 4.22 - 5.81 MIL/uL    Hemoglobin  7.4 (L) 13.0 - 17.0 g/dL    HCT 46.9 (L) 62.9 - 52.0 %    MCV 102.3 (H) 80.0 - 100.0 fL    MCH 33.6 26.0 - 34.0 pg    MCHC 32.9 30.0 - 36.0 g/dL    RDW 52.8 41.3 - 24.4 %    Platelets 298 150 - 400 K/uL    nRBC 0.0 0.0 - 0.2 %      Comment: Performed at [facility], [address], [place], [place] [address]  CK     Status: None    Collection Time: 09/09/22  1:24 AM  Result Value Ref Range    Total CK 69 49 - 397 U/L      Comment: Performed at [facility], [address] [place], [place] [address]  Heparin level (unfractionated)     Status: None    Collection Time: 09/09/22  1:24 AM  Result Value Ref Range    Heparin Unfractionated 0.39 0.30 - 0.70 IU/mL      Comment: (NOTE) The clinical reportable range upper limit is being lowered to >1.10 to align with the FDA approved guidance for the current laboratory assay.   If heparin results are below expected values, and patient dosage has  been confirmed, suggest follow up testing of antithrombin III levels. Performed at [facility], [address] [place], [place] [address]    Heparin level (unfractionated)     Status: None    Collection Time: 09/09/22 11:08 AM  Result Value Ref Range    Heparin Unfractionated 0.56 0.30 - 0.70 IU/mL      Comment: (NOTE) The clinical reportable range upper limit is being lowered to >1.10 to align with the FDA approved guidance for the current laboratory assay.  If heparin results are below expected values, and patient dosage has  been confirmed, suggest follow up testing of antithrombin III levels. Performed at [facility], [address], [place], [place] [address]    Basic metabolic panel     Status: Abnormal    Collection Time: 09/10/22  1:27 AM  Result Value Ref Range    Sodium 134 (L) 135 - 145 mmol/L    Potassium 4.2 3.5 - 5.1 mmol/L    Chloride 100 98 - 111 mmol/L    CO2 27 22 - 32 mmol/L    Glucose, Bld 99 70 - 99 mg/dL      Comment: Glucose  reference range applies only to samples taken after fasting for at least 8 hours.    BUN 32 (H) 8 - 23 mg/dL    Creatinine, Ser 6.04 0.61 - 1.24 mg/dL    Calcium 8.5 (L) 8.9 - 10.3 mg/dL    GFR, Estimated >54 >09 mL/min      Comment: (NOTE) Calculated using the CKD-EPI Creatinine Equation (2021)      Anion gap 7 5 - 15      Comment: Performed at [facility], [address] [place], [place] [address]  CBC     Status: Abnormal    Collection Time: 09/10/22  1:27 AM  Result Value Ref Range    WBC 6.9 4.0 - 10.5 K/uL    RBC 2.23 (L) 4.22 - 5.81 MIL/uL    Hemoglobin 7.7 (L) 13.0 - 17.0 g/dL    HCT 47.8 (L) 29.5 - 52.0 %    MCV 105.8 (H) 80.0 - 100.0 fL    MCH 34.5 (H) 26.0 - 34.0 pg    MCHC 32.6 30.0 - 36.0 g/dL    RDW 62.1 (H) 30.8 - 15.5 %    Platelets 296 150 - 400 K/uL    nRBC 0.0 0.0 - 0.2 %      Comment: Performed at [facility], [address] [place], [place] [address]      Imaging Results (Last 48 hours)  No results found.         Blood pressure (!) 158/64, pulse 60, temperature 97.7 F (36.5 C), temperature source Oral, resp. rate 18, height 5\' 7"  (1.702 m), weight 82.1 kg, SpO2 100 %.   Medical Problem List and Plan: 1. Functional deficits secondary to debility related to sepsis from left 5th MT osteomyelitis and other medical complications. Pt is s/p right 5th ray amputation 5/22.             -TDWB RLE             -patient may not yet shower             -ELOS/Goals: 21-24 days, supervision to min assist goals with PT, OT 2.  Antithrombotics: -DVT/anticoagulation:  Pharmaceutical: Eliquis             -antiplatelet therapy: ASA 81mg  daily 3. Pain Management:  Oxycodone prn.  4. Mood/Behavior/Sleep: LCSW to follow for evaluation and support.              -antipsychotic agents: N/A             -Melatonin prn for insomnia.  5. Neuropsych/cognition: This patient is capable of making decisions on his own behalf. 6. Skin/Wound Care: Continue wound VAC for  now.   -pressure relief and foam dressings to left heel, buttocks wounds. Medihoney with gauze   under foam  dressing for left heel.. 7. Fluids/Electrolytes/Nutrition: Strict I/O w/daily weights             --Add  boost to help w/protein stores and promote healing.              --On zinc and Vitamin C 8. Bacteremia with osteomyelitis: Completed IV antibiotics and s/p Ray amputation             --Mop tx with Augmentin/doxycycline through June 5 9. Hyponatremia/pre-renal azotemia: Na improving form 131-->134. BUN with rise to 32.             --encourage fluid intake and should improve off Lasix.  --hyponatremia resolved off HCTZ. 10. ABLA/B12 deficiency: Continue to monitor. Has high ferritin level with MCV 104. -daily B12 injections until discharge from rehab -transfuse for hgb <7  11. Vitamin B 12 deficiency: Vitam in B 12< 50 with recheck after supplementation > 7,500 12. Fluid overload: Daily wts with strict I/O. Monitor for signs of overload.  --has been on IV lasix 20 mg since 05/08 w/recs to change to to prn. -will change lasix to 20mg  daily prn and monitor weights daily.  --will add low salt diet.  13. Prostate cancer  -monitor voiding patterns  -flomax  -f/u as outpt with Dr. Retta Diones 14. New onset A fib:  -rate is controlled  -transitioned to eliquis by cardiology, continues on ASA as well       Jacquelynn Cree, PA-C 09/10/2022  I have personally performed a face to face diagnostic evaluation of this patient and formulated the key components of the plan.  Additionally, I have personally reviewed laboratory data, imaging studies, as well as relevant notes and concur with the physician assistant's documentation above.  The patient's status has not changed from the original H&P.  Any changes in documentation from the acute care chart have been noted above.  Ranelle Oyster, MD, [place]

## 2022-09-10 NOTE — Discharge Summary (Signed)
Physician Discharge Summary   Patient: Jesse TANAKA Sr. MRN: [id number] DOB: 28-Aug-1928  Admit date:     08/24/2022  Discharge date: 06/12/2022  Discharge Physician: Pennie Banter   PCP: Elfredia Nevins, MD   Recommendations at discharge:   Follow up as scheduled with Orthopedic Surgery Maintain wound vac in place  Follow up on blood pressures and adjust regimen as needed.  HCTZ being held at d/c to avoid hypotension. Monitor volume status and need for PRN Lasix Follow up with Cardiology for ongoing management of A-fib  Discharge Diagnoses: Principal Problem:   Polymicrobial bacterial infection Active Problems:   Fever of undetermined origin   Lactic acidosis   Generalized weakness   Bronchitis   Bilateral pleural effusion   Right foot ulcer (HCC)   Essential hypertension   BPH (benign prostatic hyperplasia)   Paroxysmal atrial fibrillation (HCC)   Osteomyelitis of fifth toe of right foot (HCC)   Bacteremia   Acute cystitis without hematuria   PVD (peripheral vascular disease) (HCC)  Resolved Problems:   * No resolved hospital problems. *  Hospital Course: Jesse NIESS Sr. is a 89 y.o. male with a history of hypertension, BPH, prostate cancer presented secondary to generalized weakness, difficulty ambulation a few weeks on 08/25/22  Seen in the ED, found to have anemia, lactic acidosis underwent CT angio chest, CT abdomen pelvis, x-ray left foot right foot chest x-ray> and admitted for sepsis/fever, lactic acidosis with right foot ulcer, bronchitis Workup revealed gram-positive bacteremia, Aerococcus//Actinotignum schaalii bacteremia right foot osteomyelitis of fifth metatarsal head.  ABI noted poor circulation/critical ischemia with tissue loss at the toes- underwent angiogram  5/16:with recanalization/balloon angioplasty of the tibioperoneal trunk and resolution of flow-limiting stenosis within the posterior tibial artery by vascular Dr Zella Ball. Dr. Lajoyce Corners has been consulted>  planning for right foot fifth ray amputation. Seen by infectious disease, completed antibiotics and monitoring off antibiotic Seen by cardiology for new onset A-fib given his high CHA2DS2-VASc score placed on heparin drip 5/20  Assessment and Plan:  Sepsis POA w/ Gram-positive bacteremia/Aerococcus//Actinotignum schaalii bacteremia Status post 5th ray amputation 5/22 Seen by Infectious Disease --Continue PO Augmentin, Doxycycline through June 5 --Esophagitis precautions for doxycycline (take with meals, ample water after taking, remain upright for at least 60 minutes after taking)  Right foot osteomyelitis of fifth metatarsal head: Critical critical ischemia with tissue loss at the toes: S/p recanalization/balloon angioplasty of the tibioperoneal trunk and resolution of flow-limiting stenosis within the posterior tibial artery by vascular Dr Zella Ball.   S/P right foot fifth ray amputation 5/22 --Continue wound VAC  with Praveena plus portable wound VAC pump per Dr. Lajoyce Corners  --Minimize weightbearing right lower extremity.  On aspirin, Eliquis (for A-fib), Lipitor --Plavix was stopped due to high bleed risk on triple therapy Dr. Jonathon Bellows discussed w/ Dr Karin Lieu 5/23, advised to continue aspirin and Eliquis since he also has A-fib  Appreciate ID input noted plan to continue po antibiotics for few weeks.   A-fib with slow ventricular response rate: Echo with normal EF.  Appears new onset --Seen by cardiology --Initially on heparin drip 5/20 --Transitioned to Eliquis 5 mg BID     (Plavix was stopped, remains on ASA)   Peripheral neuropathy B12 was aggressively replaced. --Monitor - repeat B12 level in follow up.  Debility deconditioning weakness  --Seen by PT/OT --Accepted for acute inpatient rehab  Prostate cancer followed by urology  --Continue Flomax  Sclerotic focus noted on left second rib and nonspecific. Recommend close  follow up w/ urology, follows w/Dr. Retta Diones.   Hyponatremia:  Mild --Monitor BMP --HCTZ has been stopped to avoid hypotension, can contribute to hyponatremia, monitor more closely if resumed.   Anemia of chronic disease and b12 deficiency: anemia panel w/ iron  saturation 60 normal I folate, B12 was previously low on 5/7 < 50 Has been getting B12 injections daily, repeat B12 level elevated. --Stop daily injections at d/c --Repeat B12 level in ~3 months --Monitor CBC and transfuse if Hbg < 7    Pressure injury of the left  heel, right buttock POA I agree with the wound description/s as outlined below. Continue wound care, offloading pressure, frequent repositioning. Monitor site/s for signs of infection.  Pressure Injury 08/25/22 Heel Left Deep Tissue Pressure Injury - Purple or maroon localized area of discolored intact skin or blood-filled blister due to damage of underlying soft tissue from pressure and/or shear. 6.5cm x 5.5cm (Active)  08/25/22 0730  Location: Heel  Location Orientation: Left  Staging: Deep Tissue Pressure Injury - Purple or maroon localized area of discolored intact skin or blood-filled blister due to damage of underlying soft tissue from pressure and/or shear.  Wound Description (Comments): 6.5cm x 5.5cm  Present on Admission: Yes     Pressure Injury 08/25/22 Buttocks Right Stage 2 -  Partial thickness loss of dermis presenting as a shallow open injury with a red, pink wound bed without slough. (Active)  08/25/22   Location: Buttocks  Location Orientation: Right  Staging: Stage 2 -  Partial thickness loss of dermis presenting as a shallow open injury with a red, pink wound bed without slough.  Wound Description (Comments):   Present on Admission: Yes           Consultants:  Infectious Disease, Orthopedic surgery, Cardiology, Wound care  Procedures performed: 5th ray amputation 09/07/21, Dr. Lajoyce Corners   Disposition:  Acute Inpatient Rehab   Diet recommendation:  Discharge Diet Orders (From admission, onward)      Start     Ordered   09/10/22 0000  Diet - low sodium heart healthy        09/10/22 1254            DISCHARGE MEDICATION: Allergies as of 09/10/2022       Reactions   Codeine Swelling        Medication List     STOP taking these medications    hydrochlorothiazide 25 MG tablet Commonly known as: HYDRODIURIL       TAKE these medications    acetaminophen 325 MG tablet Commonly known as: TYLENOL Take 1-2 tablets (325-650 mg total) by mouth every 6 (six) hours as needed for mild pain (pain score 1-3 or temp > 100.5).   alendronate 70 MG tablet Commonly known as: FOSAMAX TAKE 1 TABLET EVERY WEEK IN THE MORNING 30 MIN BEFORE EATING WITH AN 8OZ GLASS OF WATER (SIT UP 30 MIN)   alum & mag hydroxide-simeth 200-200-20 MG/5ML suspension Commonly known as: MAALOX/MYLANTA Take 15-30 mLs by mouth every 2 (two) hours as needed for indigestion.   amoxicillin-clavulanate 875-125 MG tablet Commonly known as: AUGMENTIN Take 1 tablet by mouth every 12 (twelve) hours. Take through June 5.   apixaban 5 MG Tabs tablet Commonly known as: ELIQUIS Take 1 tablet (5 mg total) by mouth 2 (two) times daily.   ascorbic acid 1000 MG tablet Commonly known as: VITAMIN C Take 1 tablet (1,000 mg total) by mouth daily. Start taking on: Sep 11, 2022   aspirin  EC 81 MG tablet Take 81 mg by mouth daily.   atorvastatin 40 MG tablet Commonly known as: LIPITOR Take 1 tablet (40 mg total) by mouth daily. Start taking on: Sep 11, 2022   benazepril 10 MG tablet Commonly known as: LOTENSIN Take 10 mg by mouth daily.   bisacodyl 5 MG EC tablet Commonly known as: DULCOLAX Take 1 tablet (5 mg total) by mouth daily as needed for moderate constipation.   docusate sodium 100 MG capsule Commonly known as: COLACE Take 1 capsule (100 mg total) by mouth daily. Start taking on: Sep 11, 2022   doxycycline 100 MG tablet Commonly known as: VIBRA-TABS Take 1 tablet (100 mg total) by mouth every 12  (twelve) hours. Take through June 5.   fish oil-omega-3 fatty acids 1000 MG capsule Take 1 g by mouth daily.   furosemide 20 MG tablet Commonly known as: LASIX Take 2 tablets (40 mg total) by mouth daily as needed (swelling or weight gain > 5 lbs in 3 days). What changed:  how much to take reasons to take this   guaiFENesin-dextromethorphan 100-10 MG/5ML syrup Commonly known as: ROBITUSSIN DM Take 15 mLs by mouth every 4 (four) hours as needed for cough.   leptospermum manuka honey Pste paste Apply 1 Application topically daily. Start taking on: Sep 11, 2022   multivitamin with minerals Tabs tablet Take 1 tablet by mouth daily. Start taking on: Sep 11, 2022   nutrition supplement (JUVEN) Pack Take 1 packet by mouth 2 (two) times daily between meals.   omega-3 acid ethyl esters 1 g capsule Commonly known as: LOVAZA Take 1 capsule (1 g total) by mouth daily. Start taking on: Sep 11, 2022   ondansetron 4 MG tablet Commonly known as: ZOFRAN Take 1 tablet (4 mg total) by mouth every 6 (six) hours as needed for nausea.   oxyCODONE 5 MG immediate release tablet Commonly known as: Oxy IR/ROXICODONE Take 1-2 tablets (5-10 mg total) by mouth every 4 (four) hours as needed for moderate pain (pain score 4-6).   polyethylene glycol 17 g packet Commonly known as: MIRALAX / GLYCOLAX Take 17 g by mouth daily as needed for moderate constipation.   senna-docusate 8.6-50 MG tablet Commonly known as: Senokot-S Take 1 tablet by mouth 2 (two) times daily.   tamsulosin 0.4 MG Caps capsule Commonly known as: FLOMAX Take 0.4 mg by mouth daily after supper.   zinc sulfate 220 (50 Zn) MG capsule Take 1 capsule (220 mg total) by mouth daily. Start taking on: Sep 11, 2022               Discharge Care Instructions  (From admission, onward)           Start     Ordered   09/10/22 0000  Discharge wound care:       Comments: Maintain wound vac per orthopedic  surgery. Subsequent wound care will be per orthopedics.   09/10/22 1254   09/08/22 0000  Touch down weight bearing       Question Answer Comment  Laterality right   Extremity Lower      09/08/22 1103            Follow-up Information     [name], MD Follow up in 1 week(s).   Specialty: Orthopedic Surgery Contact information: [address] [place] [address] [phone]                Discharge Exam: Ceasar Mons Weights  08/24/22 1414 09/08/22 0949  Weight: 82.1 kg 82.1 kg   General exam: awake, alert, no acute distress HEENT: moist mucus membranes, hard of hearing  Respiratory system: CTAB, no wheezes, rales or rhonchi, normal respiratory effort. Cardiovascular system: normal S1/S2, RRR, no pedal edema.   Gastrointestinal system: soft, NT, ND, no HSM felt, +bowel sounds. Central nervous system: A&O x3. no gross focal neurologic deficits, normal speech Extremities: left foot clean dry intact dressing and wound vac in place Skin: dry, intact, normal temperature Psychiatry: normal mood, congruent affect, judgement and insight appear normal   Condition at discharge: stable  The results of significant diagnostics from this hospitalization (including imaging, microbiology, ancillary and laboratory) are listed below for reference.   Imaging Studies: MR FOOT RIGHT W WO CONTRAST  Result Date: 08/30/2022 CLINICAL DATA:  Soft tissue infection suspected. Ventral osteomyelitis. Follow-up from CT scan. Increasing lower extremity weakness and decreased ability to ambulate without assistance for about 4 weeks. No history of diabetes. EXAM: MRI OF THE RIGHT FOREFOOT WITHOUT AND WITH CONTRAST TECHNIQUE: Multiplanar, multisequence MR imaging of the right forefoot was performed before and after the administration of intravenous contrast. CONTRAST:  10mL GADAVIST GADOBUTROL 1 MMOL/ML IV SOLN COMPARISON:  Right foot radiographs 08/24/2022 and CT right foot 08/27/2022 FINDINGS:  Bones/Joint/Cartilage In the region of the soft tissue ulcer lateral to the fifth toe metatarsal head, there is moderate to high-grade lateral greater than medial 5th metatarsal head marrow edema and enhancement. There is also marrow edema and enhancement within the lateral greater than medial aspect of the base of the proximal phalanx of the fifth toe. Within the limitations of motion artifact, there appears to be partial thinning of the lateral cortex of the fifth metatarsal head and possibly the lateral base of the proximal phalanx. Mild hallux valgus with mild-to-moderate lateral greater than medial great toe metatarsophalangeal cartilage thinning and peripheral osteophytosis. Mild-to-moderate osteoarthritis of the tarsometatarsal joints diffusely. Ligaments The Lisfranc ligament complex is intact. The metatarsophalangeal and interphalangeal collateral ligaments appear grossly intact, within the limitation of the motion artifact. Muscles and Tendons No flexor or extensor tendon tear is seen. There is mild edema within the plantar midfoot flexor digitorum brevis musculotendinous junction, nonspecific myositis (axial series 8 images 26 through 29). Soft tissues Redemonstration of soft tissue ulcer lateral to the great toe metatarsal head that appears to extend down to bone (axial series 8, image 19). IMPRESSION: 1. Redemonstration of soft tissue ulcer lateral to the great toe metatarsal head that appears to extend down to bone. Marrow edema enhancement within the lateral greater than medial aspects of the fifth metatarsal head and base of the proximal phalanx. Within the limitations of motion artifact, there appears to be partial thinning of the lateral cortex of the fifth metatarsal head and possibly the lateral base of the proximal phalanx. Findings are concerning for acute osteomyelitis. 2. Mild hallux valgus with mild-to-moderate great toe metatarsophalangeal cartilage thinning and peripheral osteophytosis. 3.  Mild edema within the plantar midfoot flexor digitorum brevis musculotendinous junction, nonspecific myositis. Electronically Signed   By: Neita Garnet M.D.   On: 08/30/2022 16:32   VAS [place] ABI WITH/WO TBI  Result Date: 08/29/2022  LOWER EXTREMITY DOPPLER STUDY Patient Name:  [name] SR.  Date of Exam:   08/28/2022 Medical Rec #: [id number]         Accession #:    [id number] Date of Birth: 03-02-1929         Patient Gender: M Patient Age:  89 years Exam Location:  [facility] Procedure:      VAS [place] ABI WITH/WO TBI Referring Phys: RALPH NETTEY --------------------------------------------------------------------------------  Indications: Ulceration. Other Factors: Osteomyelitis, paroxysmal atrial fibrillation.  Limitations: Today's exam was limited due to atrial fibrillation, bandages. Comparison Study: No prior study on file Performing Technologist: Sherren Kerns RVS  Examination Guidelines: A complete evaluation includes at minimum, Doppler waveform signals and systolic blood pressure reading at the level of bilateral brachial, anterior tibial, and posterior tibial arteries, when vessel segments are accessible. Bilateral testing is considered an integral part of a complete examination. Photoelectric Plethysmograph (PPG) waveforms and toe systolic pressure readings are included as required and additional duplex testing as needed. Limited examinations for reoccurring indications may be performed as noted.  ABI Findings: +---------+------------------+-----+-----------+--------+ Right    Rt Pressure (mmHg)IndexWaveform   Comment  +---------+------------------+-----+-----------+--------+ Brachial 150                    multiphasic         +---------+------------------+-----+-----------+--------+ PTA      105               0.62 monophasic          +---------+------------------+-----+-----------+--------+ DP       109               0.65 monophasic           +---------+------------------+-----+-----------+--------+ Great Toe45                0.27 Abnormal            +---------+------------------+-----+-----------+--------+ +---------+------------------+-----+-----------+-------+ Left     Lt Pressure (mmHg)IndexWaveform   Comment +---------+------------------+-----+-----------+-------+ Brachial 168                    multiphasic        +---------+------------------+-----+-----------+-------+ PTA      186               1.11 multiphasic        +---------+------------------+-----+-----------+-------+ DP       185               1.10 multiphasic        +---------+------------------+-----+-----------+-------+ Great Toe102               0.61 Abnormal           +---------+------------------+-----+-----------+-------+ +-------+-----------+-----------+------------+------------+ ABI/TBIToday's ABIToday's TBIPrevious ABIPrevious TBI +-------+-----------+-----------+------------+------------+ Right  0.65       0.27                                +-------+-----------+-----------+------------+------------+ Left   1.11       0.61                                +-------+-----------+-----------+------------+------------+  Summary: Right: Resting right ankle-brachial index indicates moderate right lower extremity arterial disease. The right toe-brachial index is abnormal. Left: Resting left ankle-brachial index is within normal range. The left toe-brachial index is abnormal. *See table(s) above for measurements and observations.  Electronically signed by Lemar Livings MD on 08/29/2022 at 10:26:35 AM.    Final    CT FOOT RIGHT W CONTRAST  Result Date: 08/27/2022 CLINICAL DATA:  Foot swelling, diabetic, osteomyelitis suspected, xray done EXAM: CT OF THE LOWER RIGHT EXTREMITY WITH CONTRAST TECHNIQUE: Multidetector CT imaging of the lower right  extremity was performed according to the standard protocol following intravenous contrast  administration. RADIATION DOSE REDUCTION: This exam was performed according to the departmental dose-optimization program which includes automated exposure control, adjustment of the mA and/or kV according to patient size and/or use of iterative reconstruction technique. CONTRAST:  75mL OMNIPAQUE IOHEXOL 350 MG/ML SOLN COMPARISON:  Right foot radiograph 08/24/2022. FINDINGS: Bones/Joint/Cartilage There is no evidence of acute fracture. There are mild-to-moderate degenerative changes in the midfoot and forefoot. There is mild cortical thinning and sclerotic change of the fifth metatarsal head but no definite bone erosion. Diffuse osteopenia. Chronic posttraumatic deformity of the distal tibia and fibula. Ligaments Suboptimally assessed by CT. Muscles and Tendons Diffuse muscle atrophy compatible with chronic denervation. Soft tissues Lateral midfoot and forefoot soft tissue ulcers. The lateral forefoot ulcer extends to the surface of the fifth metatarsal head. There is adjacent soft tissue swelling thickening but no evidence of a well-defined/drainable fluid collection IMPRESSION: Lateral forefoot soft tissue ulcers with underlying cortical thinning and mild sclerotic change in the fifth metatarsal head, suspicious for osteomyelitis. MRI would be more definitive. Soft tissue swelling and thickening but no evidence of an organized soft tissue abscess. Electronically Signed   By: Caprice Renshaw M.D.   On: 08/27/2022 20:42   ECHOCARDIOGRAM COMPLETE  Result Date: 08/26/2022    ECHOCARDIOGRAM REPORT   Patient Name:   Jesse BARTOLD Sr. Date of Exam: 08/26/2022 Medical Rec #:  [id number]        Height:       67.0 in Accession #:    [id number]       Weight:       181.0 lb Date of Birth:  1929-04-16        BSA:          1.938 m Patient Age:    89 years         BP:           137/61 mmHg Patient Gender: M                HR:           58 bpm. [facility] Procedure: 2D Echo, Cardiac Doppler and Color Doppler  Indications:    Atrial Fibrillation I48.91  History:        Patient has no prior history of Echocardiogram examinations.                 Arrythmias:Atrial Fibrillation, Signs/Symptoms:Fever; Risk                 Factors:Hypertension and Non-Smoker.  Sonographer:    Aron Baba Referring Phys: [id number]  Sonographer Comments: No subcostal window. Image acquisition challenging due to patient body habitus and Image acquisition challenging due to respiratory motion. IMPRESSIONS  1. Left ventricular ejection fraction, by estimation, is 65 to 70%. The left ventricle has normal function. The left ventricle has no regional wall motion abnormalities. There is mild left ventricular hypertrophy. Left ventricular diastolic parameters are indeterminate.  2. Right ventricular systolic function is normal. The right ventricular size is normal. There is normal pulmonary artery systolic pressure. The estimated right ventricular systolic pressure is 14.2 mmHg.  3. Left atrial size was mildly dilated.  4. The mitral valve is degenerative. Mild mitral valve regurgitation.  5. The aortic valve is tricuspid. Aortic valve regurgitation is trivial.  6. The inferior vena cava is normal in size with <50% respiratory variability, suggesting right atrial pressure of  8 mmHg. Comparison(s): No prior Echocardiogram. FINDINGS  Left Ventricle: Left ventricular ejection fraction, by estimation, is 65 to 70%. The left ventricle has normal function. The left ventricle has no regional wall motion abnormalities. The left ventricular internal cavity size was normal in size. There is  mild left ventricular hypertrophy. Left ventricular diastolic function could not be evaluated due to atrial fibrillation. Left ventricular diastolic parameters are indeterminate. Right Ventricle: The right ventricular size is normal. No increase in right ventricular wall thickness. Right ventricular systolic function is normal. There is normal pulmonary artery  systolic pressure. The tricuspid regurgitant velocity is 1.24 m/s, and  with an assumed right atrial pressure of 8 mmHg, the estimated right ventricular systolic pressure is 14.2 mmHg. Left Atrium: Left atrial size was mildly dilated. Right Atrium: Right atrial size was normal in size. Pericardium: There is no evidence of pericardial effusion. Mitral Valve: The mitral valve is degenerative in appearance. There is mild calcification of the mitral valve leaflet(s). Mild mitral annular calcification. Mild mitral valve regurgitation. Tricuspid Valve: The tricuspid valve is grossly normal. Tricuspid valve regurgitation is mild. Aortic Valve: The aortic valve is tricuspid. There is mild aortic valve annular calcification. Aortic valve regurgitation is trivial. Pulmonic Valve: The pulmonic valve was not well visualized. Pulmonic valve regurgitation is trivial. Aorta: The aortic root is normal in size and structure. Venous: The inferior vena cava is normal in size with less than 50% respiratory variability, suggesting right atrial pressure of 8 mmHg. IAS/Shunts: No atrial level shunt detected by color flow Doppler.  LEFT VENTRICLE PLAX 2D LVIDd:         4.20 cm   Diastology LVIDs:         2.90 cm   LV e' medial:    6.60 cm/s LV PW:         1.30 cm   LV E/e' medial:  18.0 LV IVS:        0.70 cm   LV e' lateral:   8.78 cm/s LVOT diam:     1.60 cm   LV E/e' lateral: 13.6 LV SV:         35 LV SV Index:   18 LVOT Area:     2.01 cm  RIGHT VENTRICLE RV S prime:     9.90 cm/s TAPSE (M-mode): 1.2 cm LEFT ATRIUM           Index        RIGHT ATRIUM           Index LA diam:      3.30 cm 1.70 cm/m   RA Area:     13.10 cm LA Vol (A2C): 47.4 ml 24.46 ml/m  RA Volume:   30.60 ml  15.79 ml/m LA Vol (A4C): 73.5 ml 37.92 ml/m  AORTIC VALVE LVOT Vmax:   81.80 cm/s LVOT Vmean:  56.400 cm/s LVOT VTI:    0.174 m  AORTA Ao Root diam: 3.50 cm Ao Asc diam:  3.20 cm MITRAL VALVE                TRICUSPID VALVE MV Area (PHT): 3.99 cm     TR Peak  grad:   6.2 mmHg MV Decel Time: 190 msec     TR Vmax:        124.00 cm/s MR PISA:        0.57 cm MR PISA Radius: 0.30 cm     SHUNTS MV E velocity: 119.00 cm/s  Systemic VTI:  0.17 m  Systemic Diam: 1.60 cm Nona Dell MD Electronically signed by Nona Dell MD Signature Date/Time: 08/26/2022/11:44:16 AM    Final    CT Angio Chest PE W and/or Wo Contrast  Result Date: 08/24/2022 CLINICAL DATA:  Provided history: Pulmonary embolism (PE) suspected, high prob 89 year old with weakness. EXAM: CT ANGIOGRAPHY CHEST WITH CONTRAST TECHNIQUE: Multidetector CT imaging of the chest was performed using the standard protocol during bolus administration of intravenous contrast. Multiplanar CT image reconstructions and MIPs were obtained to evaluate the vascular anatomy. Performed in conjunction with CT of the abdomen and pelvis. RADIATION DOSE REDUCTION: This exam was performed according to the departmental dose-optimization program which includes automated exposure control, adjustment of the mA and/or kV according to patient size and/or use of iterative reconstruction technique. CONTRAST:  OMNIPAQUE IOHEXOL 350 MG/ML SOLN COMPARISON:  Radiograph earlier today FINDINGS: Cardiovascular: There are no filling defects within the pulmonary arteries to suggest pulmonary embolus. Aortic atherosclerosis and tortuosity. The heart is upper normal in size. No pericardial effusion. There are coronary artery calcifications. Mediastinum/Nodes: No mediastinal or hilar adenopathy. Small hiatal hernia. Lungs/Pleura: Small bilateral pleural effusions. There is mild bronchial wall thickening. No focal airspace disease. No pulmonary nodule. Upper Abdomen: Assessed on concurrent abdominal CT, reported separately. Musculoskeletal: Exaggerated thoracic kyphosis. Diffuse anterior spurring. Sclerotic focus within the left second rib series 4, image 9, nonspecific. There is mild fatty atrophy of paraspinal  musculature. Minimal bilateral gynecomastia. Review of the MIP images confirms the above findings. IMPRESSION: 1. No pulmonary embolus. 2. Small bilateral pleural effusions. 3. Mild bronchial wall thickening, can be seen with bronchitis or reactive airways disease. 4. Aortic atherosclerosis.  Coronary artery calcifications. 5. Sclerotic focus in the left second rib is nonspecific given patient's history of prostate cancer. Aortic Atherosclerosis (ICD10-I70.0). Electronically Signed   By: Narda Rutherford M.D.   On: 08/24/2022 23:18   DG Foot 2 Views Right  Result Date: 08/24/2022 CLINICAL DATA:  Ulcer. EXAM: RIGHT FOOT - 2 VIEW COMPARISON:  None Available. FINDINGS: There is no acute fracture or dislocation. The bones are osteopenic. Old healed fracture of the distal fibula. There is ulceration of the skin lateral to the fifth MTP joint. There is diffuse subcutaneous edema. No radiopaque foreign object or soft tissue gas. IMPRESSION: 1. No acute fracture or dislocation. 2. Ulceration of the skin lateral to the fifth MTP joint. Electronically Signed   By: Elgie Collard M.D.   On: 08/24/2022 22:57   CT ABDOMEN PELVIS W CONTRAST  Result Date: 08/24/2022 CLINICAL DATA:  Provided history: Abdominal pain, acute, nonlocalized 89 year old with generalized weakness. EXAM: CT ABDOMEN AND PELVIS WITH CONTRAST TECHNIQUE: Multidetector CT imaging of the abdomen and pelvis was performed using the standard protocol following bolus administration of intravenous contrast. Performed in conjunction with CTA of the chest, reported separately. RADIATION DOSE REDUCTION: This exam was performed according to the departmental dose-optimization program which includes automated exposure control, adjustment of the mA and/or kV according to patient size and/or use of iterative reconstruction technique. CONTRAST:  OMNIPAQUE IOHEXOL 350 MG/ML SOLN COMPARISON:  Abdominopelvic CT 07/05/2019 FINDINGS: Lower chest: Assessed on  concurrent chest CT, reported separately. Hepatobiliary: Scattered tiny hepatic hypodensities are unchanged from prior exam and likely benign. No new hepatic abnormality. Gallbladder physiologically distended, no calcified stone. No biliary dilatation. Pancreas: No ductal dilatation or inflammation. No evidence of pancreatic mass. Spleen: Normal in size without focal abnormality. Adrenals/Urinary Tract: No adrenal nodule. No hydronephrosis. Homogeneous renal enhancement with symmetric excretion on delayed phase  imaging. There are multiple bilateral renal cysts. These need no further imaging follow-up. Urinary bladder is moderately distended without wall thickening, although partially obscured by streak artifact from right hip arthroplasty. Stomach/Bowel: Mild stool distension of the rectum, rectal distention of 6.7 cm. There is otherwise a small volume of colonic stool. No bowel obstruction or inflammatory change. The appendix is normal. There is a small hiatal hernia. Vascular/Lymphatic: Aortic and branch atherosclerosis. No aortic aneurysm. Portal vein is patent. No enlarged lymph nodes. Assessment for right pelvic adenopathy is limited due to streak artifact from right hip arthroplasty. Reproductive: Prostate gland is obscured by streak artifact from right hip arthroplasty. Other: No free air or ascites. Diminutive fat containing umbilical hernia. Musculoskeletal: There are multiple Schmorl's nodes throughout the lumbar spine, stable. Multilevel facet hypertrophy. Right hip arthroplasty. Advanced left hip degenerative change. No focal bone lesion. IMPRESSION: 1. No acute abnormality in the abdomen/pelvis. 2. Mild stool distension of the rectum, rectal distention of 6.7 cm, can be seen with fecal impaction. No bowel obstruction or inflammatory change. 3. Moderately distended urinary bladder without wall thickening. 4. Small hiatal hernia. Aortic Atherosclerosis (ICD10-I70.0). Electronically Signed   By: Narda Rutherford M.D.   On: 08/24/2022 22:57   DG Foot 2 Views Left  Result Date: 08/24/2022 CLINICAL DATA:  Swelling of the foot. EXAM: LEFT FOOT - 2 VIEW COMPARISON:  None Available. FINDINGS: There is no acute fracture or dislocation. The bones are osteopenic. The soft tissues are grossly unremarkable. No radiopaque foreign object or soft tissue gas. IMPRESSION: 1. No acute fracture or dislocation. 2. Osteopenia. Electronically Signed   By: Elgie Collard M.D.   On: 08/24/2022 22:56   DG Chest Port 1 View  Result Date: 08/24/2022 CLINICAL DATA:  Dyspnea on exertion, shortness of breath, and weakness for few days EXAM: PORTABLE CHEST 1 VIEW COMPARISON:  Portable exam 1527 hours compared to 07/24/2019 FINDINGS: Normal heart size, mediastinal contours, and pulmonary vascularity. Atherosclerotic calcification aorta. Lungs clear. No pulmonary infiltrate, pleural effusion, or pneumothorax. Bones demineralized. IMPRESSION: No acute abnormalities. Aortic Atherosclerosis (ICD10-I70.0). Electronically Signed   By: Ulyses Southward M.D.   On: 08/24/2022 15:35    Microbiology: Results for orders placed or performed during the hospital encounter of 08/24/22  Urine Culture (for pregnant, neutropenic or urologic patients or patients with an indwelling urinary catheter)     Status: Abnormal   Collection Time: 08/24/22  4:50 PM   Specimen: Urine, Clean Catch  Result Value Ref Range Status   Specimen Description   Final    URINE, CLEAN CATCH Performed at [facility], [address], [place], [place] [address]    Special Requests   Final    NONE Performed at [facility], [address] [place], [place] [address]    Culture (A)  Final    >=100,000 COLONIES/mL AEROCOCCUS SPECIES Standardized susceptibility testing for this organism is not available. Performed at [facility], [address], [place], [place] [address]    Report Status 08/27/2022 FINAL  Final  Resp panel by RT-PCR (RSV, Flu A&B, Covid) Anterior  Nasal Swab     Status: None   Collection Time: 08/24/22  9:55 PM   Specimen: Anterior Nasal Swab  Result Value Ref Range Status   SARS Coronavirus 2 by RT PCR NEGATIVE NEGATIVE Final    Comment: (NOTE) SARS-CoV-2 target nucleic acids are NOT DETECTED.  The SARS-CoV-2 RNA is generally detectable in upper respiratory specimens during the acute phase of infection. The  lowest concentration of SARS-CoV-2 viral copies this assay can detect is 138 copies/mL. A negative result does not preclude SARS-Cov-2 infection and should not be used as the sole basis for treatment or other patient management decisions. A negative result may occur with  improper specimen collection/handling, submission of specimen other than nasopharyngeal swab, presence of viral mutation(s) within the areas targeted by this assay, and inadequate number of viral copies(<138 copies/mL). A negative result must be combined with clinical observations, patient history, and epidemiological information. The expected result is Negative.  Fact Sheet for Patients:  BloggerCourse.com  Fact Sheet for Healthcare Providers:  SeriousBroker.it  This test is no t yet approved or cleared by the [place] FDA and  has been authorized for detection and/or diagnosis of SARS-CoV-2 by FDA under an Emergency Use Authorization (EUA). This EUA will remain  in effect (meaning this test can be used) for the duration of the COVID-19 declaration under Section 564(b)(1) of the Act, 21 U.S.C.section 360bbb-3(b)(1), unless the authorization is terminated  or revoked sooner.       Influenza A by PCR NEGATIVE NEGATIVE Final   Influenza B by PCR NEGATIVE NEGATIVE Final    Comment: (NOTE) The Xpert Xpress SARS-CoV-2/FLU/RSV plus assay is intended as an aid in the diagnosis of influenza from Nasopharyngeal swab specimens and should not be used as a sole basis for treatment. Nasal washings  and aspirates are unacceptable for Xpert Xpress SARS-CoV-2/FLU/RSV testing.  Fact Sheet for Patients: BloggerCourse.com  Fact Sheet for Healthcare Providers: SeriousBroker.it  This test is not yet approved or cleared by the [place] FDA and has been authorized for detection and/or diagnosis of SARS-CoV-2 by FDA under an Emergency Use Authorization (EUA). This EUA will remain in effect (meaning this test can be used) for the duration of the COVID-19 declaration under Section 564(b)(1) of the Act, 21 U.S.C. section 360bbb-3(b)(1), unless the authorization is terminated or revoked.     Resp Syncytial Virus by PCR NEGATIVE NEGATIVE Final    Comment: (NOTE) Fact Sheet for Patients: BloggerCourse.com  Fact Sheet for Healthcare Providers: SeriousBroker.it  This test is not yet approved or cleared by the [place] FDA and has been authorized for detection and/or diagnosis of SARS-CoV-2 by FDA under an Emergency Use Authorization (EUA). This EUA will remain in effect (meaning this test can be used) for the duration of the COVID-19 declaration under Section 564(b)(1) of the Act, 21 U.S.C. section 360bbb-3(b)(1), unless the authorization is terminated or revoked.  Performed at [facility], [address] [place], [place] [address]   Blood Culture (routine x 2)     Status: Abnormal   Collection Time: 08/24/22 10:01 PM   Specimen: BLOOD  Result Value Ref Range Status   Specimen Description   Final    BLOOD BLOOD LEFT HAND Performed at [facility], [address] [place], [place] [address]    Special Requests   Final    BOTTLES DRAWN AEROBIC AND ANAEROBIC Blood Culture adequate volume Performed at [facility], [address], [place], [place] [address]    Culture  Setup Time   Final    GRAM POSITIVE COCCI IN BOTH AEROBIC AND ANAEROBIC BOTTLES Gram Stain Report Called  to,Read Back By and Verified With: PIPKIN,C ON 08/25/22 AT 2110 BY LOY,C CRITICAL RESULT CALLED TO, READ BACK BY AND VERIFIED WITH: C PIPKINS,RN@[id number]  11/26/22 [facility] Performed at [facility], [address], [place], [place] [address]    Culture (A)  Final    AEROCOCCUS  URINAE Standardized susceptibility testing for this organism is not available. Performed at [facility], [address], [place], [place] [address]    Report Status 08/29/2022 FINAL  Final  Blood Culture ID Panel (Reflexed)     Status: None   Collection Time: 08/24/22 10:01 PM  Result Value Ref Range Status   Enterococcus faecalis NOT DETECTED NOT DETECTED Final   Enterococcus Faecium NOT DETECTED NOT DETECTED Final   Listeria monocytogenes NOT DETECTED NOT DETECTED Final   Staphylococcus species NOT DETECTED NOT DETECTED Final   Staphylococcus aureus (BCID) NOT DETECTED NOT DETECTED Final   Staphylococcus epidermidis NOT DETECTED NOT DETECTED Final   Staphylococcus lugdunensis NOT DETECTED NOT DETECTED Final   Streptococcus species NOT DETECTED NOT DETECTED Final   Streptococcus agalactiae NOT DETECTED NOT DETECTED Final   Streptococcus pneumoniae NOT DETECTED NOT DETECTED Final   Streptococcus pyogenes NOT DETECTED NOT DETECTED Final   A.calcoaceticus-baumannii NOT DETECTED NOT DETECTED Final   Bacteroides fragilis NOT DETECTED NOT DETECTED Final   Enterobacterales NOT DETECTED NOT DETECTED Final   Enterobacter cloacae complex NOT DETECTED NOT DETECTED Final   Escherichia coli NOT DETECTED NOT DETECTED Final   Klebsiella aerogenes NOT DETECTED NOT DETECTED Final   Klebsiella oxytoca NOT DETECTED NOT DETECTED Final   Klebsiella pneumoniae NOT DETECTED NOT DETECTED Final   Proteus species NOT DETECTED NOT DETECTED Final   Salmonella species NOT DETECTED NOT DETECTED Final   Serratia marcescens NOT DETECTED NOT DETECTED Final   Haemophilus influenzae NOT DETECTED NOT DETECTED Final   Neisseria meningitidis NOT  DETECTED NOT DETECTED Final   Pseudomonas aeruginosa NOT DETECTED NOT DETECTED Final   Stenotrophomonas maltophilia NOT DETECTED NOT DETECTED Final   Candida albicans NOT DETECTED NOT DETECTED Final   Candida auris NOT DETECTED NOT DETECTED Final   Candida glabrata NOT DETECTED NOT DETECTED Final   Candida krusei NOT DETECTED NOT DETECTED Final   Candida parapsilosis NOT DETECTED NOT DETECTED Final   Candida tropicalis NOT DETECTED NOT DETECTED Final   Cryptococcus neoformans/gattii NOT DETECTED NOT DETECTED Final    Comment: Performed at [facility], [address] [place], [place] [address]  Blood Culture (routine x 2)     Status: Abnormal   Collection Time: 08/24/22 10:02 PM   Specimen: BLOOD  Result Value Ref Range Status   Specimen Description   Final    BLOOD BLOOD LEFT WRIST Performed at [facility], [address], [place], [place] [address]    Special Requests   Final    BOTTLES DRAWN AEROBIC AND ANAEROBIC Blood Culture adequate volume Performed at [facility], [address], [place], [place] [address]    Culture  Setup Time   Final    GRAM POSITIVE COCCI AEROBIC BOTTLE ONLY CRITICAL VALUE NOTED.  VALUE IS CONSISTENT WITH PREVIOUSLY REPORTED AND CALLED VALUE. GRAM POSITIVE RODS ANAEROBIC BOTTLE ONLY Gram Stain Report Called to,Read Back By and Verified With: MADISON,GINGER @ [phone] ON 08/26/2022 BY FRATTO,ASHLEY Performed at [facility], [address], [place], [place] [address]    Culture (A)  Final    AEROCOCCUS URINAE GRAM POSITIVE RODS Actinotignum schaalii  Standardized susceptibility testing for this organism is not available. Performed at [facility], [address] [place], [place] [address]    Report Status 08/28/2022 FINAL  Final  Culture, blood (Routine X 2) w Reflex to ID Panel     Status: None   Collection Time: 08/26/22 11:50 AM   Specimen: BLOOD  RIGHT HAND  Result Value Ref Range Status   Specimen Description BLOOD RIGHT HAND  Final    Special Requests   Final    BOTTLES DRAWN AEROBIC AND ANAEROBIC Blood Culture results may not be optimal due to an excessive volume of blood received in culture bottles   Culture   Final    NO GROWTH 5 DAYS Performed at [facility], [address], [place], [place] [address]    Report Status 08/31/2022 FINAL  Final  Culture, blood (Routine X 2) w Reflex to ID Panel     Status: None   Collection Time: 08/26/22 11:50 AM   Specimen: Right Antecubital; Blood  Result Value Ref Range Status   Specimen Description RIGHT ANTECUBITAL  Final   Special Requests   Final    BOTTLES DRAWN AEROBIC AND ANAEROBIC Blood Culture adequate volume   Culture   Final    NO GROWTH 5 DAYS Performed at [facility], [address], [place], [place] [address]    Report Status 08/31/2022 FINAL  Final  Surgical PCR screen     Status: None   Collection Time: 09/02/22  1:21 AM   Specimen: Nasal Mucosa; Nasal Swab  Result Value Ref Range Status   MRSA, PCR NEGATIVE NEGATIVE Final   Staphylococcus aureus NEGATIVE NEGATIVE Final    Comment: (NOTE) The Xpert SA Assay (FDA approved for NASAL specimens in patients 18 years of age and older), is one component of a comprehensive surveillance program. It is not intended to diagnose infection nor to guide or monitor treatment. Performed at [facility], [address] [place], [place] [address]     Labs: CBC: Recent Labs  Lab 09/06/22 0218 09/07/22 0147 09/08/22 0231 09/09/22 0124 09/10/22 0127  WBC 7.4 7.6 8.9 5.3 6.9  NEUTROABS 3.7  --   --   --   --   HGB 8.1* 7.8* 7.6* 7.4* 7.7*  HCT 25.2* 24.2* 23.9* 22.5* 23.6*  MCV 104.6* 103.9* 103.9* 102.3* 105.8*  PLT 284 291 301 298 296   Basic Metabolic Panel: Recent Labs  Lab 09/06/22 0218 09/07/22 0147 09/08/22 0231 09/09/22 0124 09/10/22 0127  NA 132* 134* 131* 133* 134*  K 3.7 3.6 4.1 4.3 4.2  CL 99 102 98 99 100  CO2 26 25 26 24 27   GLUCOSE 106* 89 126* 127* 99  BUN 22 25* 27* 28* 32*   CREATININE 1.08 1.11 1.14 1.07 0.98  CALCIUM 8.0* 8.0* 8.0* 8.3* 8.5*  MG 2.1  --   --   --   --   PHOS 3.7  --   --   --   --    Liver Function Tests: Recent Labs  Lab 09/06/22 0218  ALBUMIN 2.5*   CBG: No results for input(s): "GLUCAP" in the last 168 hours.  Discharge time spent: greater than 30 minutes.  Signed: Pennie Banter, DO [facility] 09/10/2022

## 2022-09-10 NOTE — Progress Notes (Addendum)
Inpatient Rehab Coordinator Note:  I met with patient at bedside to discuss CIR recommendations and goals/expectations of CIR stay.  We reviewed 3 hrs/day of therapy, physician follow up, and average length of stay 2 weeks (dependent upon progress) with goals of supervision to min assist.  Difficult to get a read on pts cognition, but he appears fairly appropriate.  He is agreeable to CIR and is amenable to me calling his daughter, Windell Moulding, to discuss.  I left a voicemail for home and cell number and will continue to follow.    1244: Discussed with pt's daughter, Windell Moulding, over the phone.  Detailed PLOF given, including supervision for all mobility and some assist for ADLs.  Windell Moulding is home during all waking hours, and plans any errands for when he is reliably asleep.  She has support from her son and her son's father as well.  Home is not w/c accessible, pt will need to be ambulatory for at least short distances to safely discharge home.  She can provide min assist.    Dr. Denton Lank in agreement for pt to admit to CIR today, TOC aware.    Estill Dooms, PT, DPT Admissions Coordinator [phone] 09/10/22  11:11 AM

## 2022-09-10 NOTE — H&P (Signed)
Physical Medicine and Rehabilitation Admission H&P    Chief Complaint  Patient presents with   Debility    HPI:  [name] is a 89 year old male with history of HTN, prostate cancer (Dr. Retta Diones), 4 week history of BLE weakness progressing to difficulty walking, SOB with activity, ulcer on right foot as well as developing sacral ulcer. He was admitted for work up on 05/07 and became febrile in ED,developed rigors, lactic acidosis and was started on IVF as well as broad spectrum antibiotics. He was noted to have A fib with bradycardic ventricular response. CTA chest negative for PE and CT abdomen/pelvis showed moderately distended bladder as well as stool in distal rectum with rectal distension 6.7 cm seen w/fecal impaction.   He was found to have undetectable Vitamin B 12 level w/neuropathy and started on supplementation.   He developed peripheral edema therefore IVF/Lasix d/c.  2 D echo showed EF 65-70% with mild LVH and degenerative MV with mid MVR.  He had sudden rise in WBC the next day to 19.8 but failed attempts at MRI of foot due to excessive movement. Blood cultures X 2 positive for Aerococcus Urinae and 1+ for actinotignum Schaalii. CT foot showed possible osteomyelitis of 5th MTH and antibiotics narrowed to Rocephin on 05/13.  MRI foot done revealing concerwsn of acute osteomyelitis of right great toe MTH as well as mild edema plantar mid foot felt to be nonspecific myositis. Dr. Myra Gianotti and Dr. Lajoyce Corners consulted for input. He was noted to have decreased ABI RLE and patient underwent balloon angioplasty of posterior tibial artery and tibioperoneal trunk on 05/16 by Dr. Karin Lieu.   Dr. Rosemary Holms recommended DOAC when cleared from surgeries. Antibiotics held prior ot surgery and he underwent right 5th toe Ray amputation on 09/08/22 by Dr. Lajoyce Corners and post op to be TDWB on RLE. He was transitioned from IV heparin to Eliquis on 05/23.  He has completed antibiotic regimen for bacteremia most  likely from urinary source and as post amputation; ID recommends 2 weeks of empiric Doxycyline (with esophagitis precautions) and Augmentin "to mop up any residual infection". Leucocytosis has resolved and ABLA stable. PT/OT has been working with patient who has difficulty maintaining TDWB on RLE, has strong posterior lean on standing as well as weakness. CIR recommended due to functional decline.   .   Review of Systems  Constitutional:  Negative for chills and fever.  HENT:  Positive for hearing loss.   Cardiovascular:  Negative for chest pain, palpitations and leg swelling.  Gastrointestinal:  Negative for constipation.  Genitourinary:  Negative for dysuria.  Skin:  Negative for rash.  Neurological:  Positive for weakness.  Psychiatric/Behavioral:  The patient does not have insomnia.     Past Medical History:  Diagnosis Date   Hypertension    Kidney calculus 2014;2013   Pneumonia    Prostate cancer [facility])    Shortness of breath     Past Surgical History:  Procedure Laterality Date   AMPUTATION Right 09/08/2022   Procedure: RIGHT FOOT FIFTH RAY AMPUTATION;  Surgeon: [name], MD;  Location: [facility] OR;  Service: Orthopedics;  Laterality: Right;   CATARACT EXTRACTION W/PHACO Right 02/13/2013   Procedure: CATARACT EXTRACTION RIGHT EYE (WITH PHACO) AND INTRAOCULAR LENS PLACEMENT  CDE=14.53;  Surgeon: Loraine Leriche T. Nile Riggs, MD;  Location: [facility] ORS;  Service: Ophthalmology;  Laterality: Right;   CATARACT EXTRACTION W/PHACO Left 02/27/2013   Procedure: CATARACT EXTRACTION PHACO AND INTRAOCULAR LENS PLACEMENT (IOC);  Surgeon: Loraine Leriche T. Nile Riggs, MD;  Location: [facility] ORS;  Service: Ophthalmology;  Laterality: Left;  CDE:12.54   COLONOSCOPY     JOINT REPLACEMENT Right 1993;1980   Right x2    History reviewed. No pertinent family history.   Social History: Lives with daughter (who's retired). Independent with walker for household distances and use wheelchair for longer distances.  Required assistance  with ADLs. Per  reports that he has never smoked. He has never used smokeless tobacco. He reports that he does not drink alcohol and does not use drugs.    Allergies  Allergen Reactions   Codeine Swelling    Medications Prior to Admission  Medication Sig Dispense Refill   alendronate (FOSAMAX) 70 MG tablet TAKE 1 TABLET EVERY WEEK IN THE MORNING 30 MIN BEFORE EATING WITH AN 8OZ GLASS OF WATER (SIT UP 30 MIN) 4 tablet 11   aspirin EC 81 MG tablet Take 81 mg by mouth daily.     benazepril (LOTENSIN) 10 MG tablet Take 10 mg by mouth daily.     fish oil-omega-3 fatty acids 1000 MG capsule Take 1 g by mouth daily.     hydrochlorothiazide (HYDRODIURIL) 25 MG tablet Take 25 mg by mouth daily.     tamsulosin (FLOMAX) 0.4 MG CAPS capsule Take 0.4 mg by mouth daily after supper.     furosemide (LASIX) 20 MG tablet Take 20-40 mg by mouth daily as needed for fluid.  (Patient not taking: Reported on 08/25/2022)      Home: Home Living Family/patient expects to be discharged to:: Private residence Living Arrangements: Children Available Help at Discharge: Family, Available PRN/intermittently, Other (Comment) Type of Home: House Home Access: Ramped entrance Home Layout: Able to live on main level with bedroom/bathroom, Laundry or work area in basement [employer] Shower/Tub: Engineer, manufacturing systems: Handicapped height Bathroom Accessibility: Yes Home Equipment: Agricultural consultant (2 wheels), [employer] - single point, BSC/3in1, Information systems manager, Wheelchair - manual Additional Comments: Information is per pt.   Functional History: Prior Function Prior Level of Function : Needs assist Physical Assist : ADLs (physical), Mobility (physical) Mobility (physical): Bed mobility, Transfers, Gait ADLs (physical): Bathing, Dressing, Toileting, IADLs, Grooming Mobility Comments: Supervised household ambulator using RW, uses wheelchair for longer distances ADLs Comments: Assist for bathing, dressing, toileting, and  grooming. Able to feed self. Assist IADL's.  Functional Status:  Mobility: Bed Mobility Overal bed mobility: Needs Assistance Bed Mobility: Supine to Sit Rolling: Min assist Sidelying to sit: Mod assist, +2 for safety/equipment, HOB elevated Supine to sit: +2 for physical assistance, Mod assist, HOB elevated Sit to supine: +2 for physical assistance, Mod assist General bed mobility comments: cues for sequencing, assist for LEs over EOB, especially R LE, to raise trunk and for hips to EOB with bed pad, guided trunk and assisted LEs back into bed Transfers Overall transfer level: Needs assistance Equipment used: Rolling walker (2 wheels) Transfers: Sit to/from Stand Sit to Stand: +2 physical assistance, Max assist, Mod assist, From elevated surface Bed to/from chair/wheelchair/BSC transfer type:: Via Lift equipment Step pivot transfers: Mod assist, Max assist Transfer via Lift Equipment: [employer] transfer comment: cues for hand placement, assist to rise and steady, requiring more assist second trial with increased posterior lean, pt able to maintain minimal weigh on R LE in static standing but uinable to progress to stepping without increasing wt through RLE Ambulation/Gait Ambulation/Gait assistance: Max assist Gait Distance (Feet): 4 Feet Assistive device: Rolling walker (2 wheels) Gait Pattern/deviations: Decreased step length - right,  Decreased step length - left, Decreased stride length, Shuffle, Trunk flexed General Gait Details: pt unable Gait velocity: slow    ADL: ADL Overall ADL's : Needs assistance/impaired Eating/Feeding: Modified independent Grooming: Min guard, Sitting Upper Body Bathing: Minimal assistance, Sitting, Min guard Lower Body Bathing: Maximal assistance, Sitting/lateral leans Upper Body Dressing : Minimal assistance, Sitting Upper Body Dressing Details (indicate cue type and reason): gown change Lower Body Dressing: Total assistance, Bed  level Lower Body Dressing Details (indicate cue type and reason): post op shoe Toilet Transfer: Moderate assistance, +2 for physical assistance, BSC/3in1 Toilet Transfer Details (indicate cue type and reason): stedy used for transfer to [facility] Toileting- Clothing Manipulation and Hygiene: Total assistance, Sit to/from stand Toileting - Clothing Manipulation Details (indicate cue type and reason): patient stood in [place] for toilet hygiene with assistance of another to assist with balance Functional mobility during ADLs: Maximal assistance, Rolling walker (2 wheels) General ADL Comments: educated in WB status on R foot  Cognition: Cognition Overall Cognitive Status: Within Functional Limits for tasks assessed Orientation Level: Oriented X4 Cognition Arousal/Alertness: Awake/alert Behavior During Therapy: WFL for tasks assessed/performed Overall Cognitive Status: Within Functional Limits for tasks assessed General Comments: difficult to assess due to [place], cousin in room and reports his memory is good Difficult to assess due to: Hard of hearing/deaf   Blood pressure (!) 158/64, pulse 60, temperature 97.7 F (36.5 C), temperature source Oral, resp. rate 18, height 5\' 7"  (1.702 m), weight 82.1 kg, SpO2 100 %. Physical Exam Vitals and nursing note reviewed.  Constitutional:      Appearance: Normal appearance.     Comments: Appears younger than state age.   HENT:     Head: Normocephalic.     Right Ear: External ear normal.     Left Ear: External ear normal.     Nose: Nose normal.  Eyes:     Conjunctiva/sclera: Conjunctivae normal.  Cardiovascular:     Rate and Rhythm: Normal rate and regular rhythm.     Heart sounds: No murmur heard.    No gallop.  Pulmonary:     Effort: Pulmonary effort is normal. No respiratory distress.     Breath sounds: No wheezing or rales.  Abdominal:     General: There is no distension.     Palpations: Abdomen is soft.     Tenderness: There is no abdominal  tenderness.  Musculoskeletal:        General: Tenderness (RLE) present.     Cervical back: Normal range of motion.     Right lower leg: Edema present.  Skin:    Comments: Foam dressing with underlying gauze on left heel--left heel ulcerated, yellow drainage on dressing. Right foot with wound VAC and ACE  Neurological:     Mental Status: He is alert.     Comments: Alert and oriented x 3. Normal insight and awareness. Intact Memory. Normal language and speech. Cranial nerve exam unremarkable although he is HOH. MMT: 5/5 BUE, RLE limited by vac. LLE 3/5 prox to 4/5 distally. Sensory exam decreased to light touch in distal LE's.  No limb ataxia or cerebellar signs. No abnormal tone appreciated.  Marland Kitchen    Psychiatric:        Mood and Affect: Mood normal.        Behavior: Behavior normal.     Results for orders placed or performed during the hospital encounter of 08/24/22 (from the past 48 hour(s))  Basic metabolic panel     Status: Abnormal  Collection Time: 09/09/22  1:24 AM  Result Value Ref Range   Sodium 133 (L) 135 - 145 mmol/L   Potassium 4.3 3.5 - 5.1 mmol/L   Chloride 99 98 - 111 mmol/L   CO2 24 22 - 32 mmol/L   Glucose, Bld 127 (H) 70 - 99 mg/dL    Comment: Glucose reference range applies only to samples taken after fasting for at least 8 hours.   BUN 28 (H) 8 - 23 mg/dL   Creatinine, Ser 1.61 0.61 - 1.24 mg/dL   Calcium 8.3 (L) 8.9 - 10.3 mg/dL   GFR, Estimated >09 >60 mL/min    Comment: (NOTE) Calculated using the CKD-EPI Creatinine Equation (2021)    Anion gap 10 5 - 15    Comment: Performed at [facility], [address] [place], [place] [address]  CBC     Status: Abnormal   Collection Time: 09/09/22  1:24 AM  Result Value Ref Range   WBC 5.3 4.0 - 10.5 K/uL   RBC 2.20 (L) 4.22 - 5.81 MIL/uL   Hemoglobin 7.4 (L) 13.0 - 17.0 g/dL   HCT 81.1 (L) 91.4 - 78.2 %   MCV 102.3 (H) 80.0 - 100.0 fL   MCH 33.6 26.0 - 34.0 pg   MCHC 32.9 30.0 - 36.0 g/dL   RDW 95.6 21.3  - 08.6 %   Platelets 298 150 - 400 K/uL   nRBC 0.0 0.0 - 0.2 %    Comment: Performed at [facility], [address], [place], [place] [address]  CK     Status: None   Collection Time: 09/09/22  1:24 AM  Result Value Ref Range   Total CK 69 49 - 397 U/L    Comment: Performed at [facility], [address], [place], [place] [address]  Heparin level (unfractionated)     Status: None   Collection Time: 09/09/22  1:24 AM  Result Value Ref Range   Heparin Unfractionated 0.39 0.30 - 0.70 IU/mL    Comment: (NOTE) The clinical reportable range upper limit is being lowered to >1.10 to align with the FDA approved guidance for the current laboratory assay.  If heparin results are below expected values, and patient dosage has  been confirmed, suggest follow up testing of antithrombin III levels. Performed at [facility], [address] [place], [place] [address]   Heparin level (unfractionated)     Status: None   Collection Time: 09/09/22 11:08 AM  Result Value Ref Range   Heparin Unfractionated 0.56 0.30 - 0.70 IU/mL    Comment: (NOTE) The clinical reportable range upper limit is being lowered to >1.10 to align with the FDA approved guidance for the current laboratory assay.  If heparin results are below expected values, and patient dosage has  been confirmed, suggest follow up testing of antithrombin III levels. Performed at [facility], [address], [place], [place] [address]   Basic metabolic panel     Status: Abnormal   Collection Time: 09/10/22  1:27 AM  Result Value Ref Range   Sodium 134 (L) 135 - 145 mmol/L   Potassium 4.2 3.5 - 5.1 mmol/L   Chloride 100 98 - 111 mmol/L   CO2 27 22 - 32 mmol/L   Glucose, Bld 99 70 - 99 mg/dL    Comment: Glucose reference range applies only to samples taken after fasting for at least 8 hours.   BUN 32 (H) 8 - 23  mg/dL   Creatinine, Ser 1.61 0.61 - 1.24 mg/dL   Calcium 8.5 (L) 8.9 - 10.3 mg/dL   GFR,  Estimated >09 >60 mL/min    Comment: (NOTE) Calculated using the CKD-EPI Creatinine Equation (2021)    Anion gap 7 5 - 15    Comment: Performed at [facility], [address], [place], [place] [address]  CBC     Status: Abnormal   Collection Time: 09/10/22  1:27 AM  Result Value Ref Range   WBC 6.9 4.0 - 10.5 K/uL   RBC 2.23 (L) 4.22 - 5.81 MIL/uL   Hemoglobin 7.7 (L) 13.0 - 17.0 g/dL   HCT 81.1 (L) 91.4 - 78.2 %   MCV 105.8 (H) 80.0 - 100.0 fL   MCH 34.5 (H) 26.0 - 34.0 pg   MCHC 32.6 30.0 - 36.0 g/dL   RDW 95.6 (H) 21.3 - 08.6 %   Platelets 296 150 - 400 K/uL   nRBC 0.0 0.0 - 0.2 %    Comment: Performed at [facility], [address], [place], [place] [address]   No results found.    Blood pressure (!) 158/64, pulse 60, temperature 97.7 F (36.5 C), temperature source Oral, resp. rate 18, height 5\' 7"  (1.702 m), weight 82.1 kg, SpO2 100 %.  Medical Problem List and Plan: 1. Functional deficits secondary to debility related to sepsis from left 5th MT osteomyelitis and other medical complications. Pt is s/p right 5th ray amputation 5/22.  -TDWB RLE  -patient may not yet shower  -ELOS/Goals: 21-24 days, supervision to min assist goals with PT, OT 2.  Antithrombotics: -DVT/anticoagulation:  Pharmaceutical: Eliquis  -antiplatelet therapy: N/A 3. Pain Management:  Oxycodone prn.  4. Mood/Behavior/Sleep: LCSW to follow for evaluation and support.   -antipsychotic agents: N/A  -Melatonin prn for insomnia.  5. Neuropsych/cognition: This patient is capable of making decisions on his own behalf. 6. Skin/Wound Care: Continue wound VAC for now.  7. Fluids/Electrolytes/Nutrition: Strict I/O w/daily weights  --Add  boost to help w/protein stores and promote healing.   --On zinc and Vitamin C 8. Bacteremia with osteomyelitis: Completed IV antibiotics and s/p Ray amputation  --Mop tx with Augmentin/doxycycline x 2 weeks w/EOT 08/30/22 9. Hyponatremia/pre-renal azotemia:  Na improving form 131-->134. BUN with rise to 32.  --encourage fluid intake and should improve off Lasix.  --hyponatremia resolved off HCTZ. 10. ABLA: Continue to monitor. Has high ferritin level with MCV 104.  11. Vitamin B 12 deficiency: Vitam in B 12< 50 with recheck after supplementation > 7,500 12. Fluid overload: Daily wts with strict I/O. Monitor for signs of overload.  --has been on IV lasix 20 mg since 05/08 w/recs to change to to prn. --will add low salt diet.      Jacquelynn Cree, PA-C 09/10/2022

## 2022-09-11 LAB — COMPREHENSIVE METABOLIC PANEL
ALT: 13 U/L (ref 0–44)
AST: 23 U/L (ref 15–41)
Albumin: 2.4 g/dL — ABNORMAL LOW (ref 3.5–5.0)
Alkaline Phosphatase: 42 U/L (ref 38–126)
Anion gap: 9 (ref 5–15)
BUN: 29 mg/dL — ABNORMAL HIGH (ref 8–23)
CO2: 24 mmol/L (ref 22–32)
Calcium: 8.4 mg/dL — ABNORMAL LOW (ref 8.9–10.3)
Chloride: 102 mmol/L (ref 98–111)
Creatinine, Ser: 1.05 mg/dL (ref 0.61–1.24)
GFR, Estimated: >60 mL/min (ref >60)
Glucose, Bld: 86 mg/dL (ref 70–99)
Potassium: 4.5 mmol/L (ref 3.5–5.1)
Sodium: 135 mmol/L (ref 135–145)
Total Bilirubin: 0.6 mg/dL (ref 0.3–1.2)
Total Protein: 4.9 g/dL — ABNORMAL LOW (ref 6.5–8.1)

## 2022-09-11 LAB — CBC WITH DIFFERENTIAL/PLATELET
Abs Immature Granulocytes: 0.03 10*3/uL (ref 0.00–0.07)
Basophils Absolute: 0.1 10*3/uL (ref 0.0–0.1)
Basophils Relative: 1 %
Eosinophils Absolute: 0.3 10*3/uL (ref 0.0–0.5)
Eosinophils Relative: 5 %
HCT: 24.3 % — ABNORMAL LOW (ref 39.0–52.0)
Hemoglobin: 7.7 g/dL — ABNORMAL LOW (ref 13.0–17.0)
Immature Granulocytes: 0 %
Lymphocytes Relative: 42 %
Lymphs Abs: 3 10*3/uL (ref 0.7–4.0)
MCH: 34.1 pg — ABNORMAL HIGH (ref 26.0–34.0)
MCHC: 31.7 g/dL (ref 30.0–36.0)
MCV: 107.5 fL — ABNORMAL HIGH (ref 80.0–100.0)
Monocytes Absolute: 0.8 10*3/uL (ref 0.1–1.0)
Monocytes Relative: 11 %
Neutro Abs: 2.9 10*3/uL (ref 1.7–7.7)
Neutrophils Relative %: 41 %
Platelets: 278 10*3/uL (ref 150–400)
RBC: 2.26 MIL/uL — ABNORMAL LOW (ref 4.22–5.81)
RDW: 16.5 % — ABNORMAL HIGH (ref 11.5–15.5)
WBC: 7.1 10*3/uL (ref 4.0–10.5)
nRBC: 0 % (ref 0.0–0.2)

## 2022-09-11 LAB — TYPE AND SCREEN
ABO/RH(D): AB POS
Antibody Screen: NEGATIVE
Donor AG Type: NEGATIVE
Donor AG Type: NEGATIVE
Unit division: 0
Unit division: 0

## 2022-09-11 LAB — BPAM RBC
Blood Product Expiration Date: 202406182359
Unit Type and Rh: 6200
Unit Type and Rh: 6200

## 2022-09-11 NOTE — Progress Notes (Signed)
Orthopedic Tech Progress Note Patient Details:  [name] Sr. 1928-07-09 [id number]  Darco shoe at bedside for use when OOB  Ortho Devices Type of Ortho Device: Darco shoe Ortho Device/Splint Location: For RLE Ortho Device/Splint Interventions: Ordered   Post Interventions Instructions Provided: Care of device, Adjustment of device  [name] 09/11/2022, 10:16 AM

## 2022-09-11 NOTE — Discharge Instructions (Addendum)
  inpatient Rehab Discharge Instructions  Jesse Pollock Sr. Discharge date and time:    Activities/Precautions/ Functional Status: Activity: no lifting, driving, or strenuous exercise till cleared by MD Diet: cardiac diet Wound Care:    Functional status:  ___ No restrictions     ___ Walk up steps independently ___ 24/7 supervision/assistance   ___ Walk up steps with assistance ___ Intermittent supervision/assistance  ___ Bathe/dress independently ___ Walk with walker     ___ Bathe/dress with assistance ___ Walk Independently    ___ Shower independently ___ Walk with assistance    ___ Shower with assistance ___ No alcohol     ___ Return to work/school ________  Special Instructions:    My questions have been answered and I understand these instructions. I will adhere to these goals and the provided educational materials after my discharge from the hospital.  Patient/Caregiver Signature _______________________________ Date __________  Clinician Signature _______________________________________ Date __________  Please bring this form and your medication list with you to all your follow-up doctor's appointments. Information on my medicine - ELIQUIS (apixaban)  This medication education was reviewed with me or my healthcare representative as part of my discharge preparation.  The pharmacist that spoke with me during my hospital stay was:    Why was Eliquis prescribed for you? Eliquis was prescribed for you to reduce the risk of a blood clot forming that can cause a stroke if you have a medical condition called atrial fibrillation (a type of irregular heartbeat).  What do You need to know about Eliquis ? Take your Eliquis TWICE DAILY - one tablet in the morning and one tablet in the evening with or without food. If you have difficulty swallowing the tablet whole please discuss with your pharmacist how to take the medication safely.  Take Eliquis exactly as prescribed by your  doctor and DO NOT stop taking Eliquis without talking to the doctor who prescribed the medication.  Stopping may increase your risk of developing a stroke.  Refill your prescription before you run out.  After discharge, you should have regular check-up appointments with your healthcare provider that is prescribing your Eliquis.  In the future your dose may need to be changed if your kidney function or weight changes by a significant amount or as you get older.  What do you do if you miss a dose? If you miss a dose, take it as soon as you remember on the same day and resume taking twice daily.  Do not take more than one dose of ELIQUIS at the same time to make up a missed dose.  Important Safety Information A possible side effect of Eliquis is bleeding. You should call your healthcare provider right away if you experience any of the following: Bleeding from an injury or your nose that does not stop. Unusual colored urine (red or dark brown) or unusual colored stools (red or black). Unusual bruising for unknown reasons. A serious fall or if you hit your head (even if there is no bleeding).  Some medicines may interact with Eliquis and might increase your risk of bleeding or clotting while on Eliquis. To help avoid this, consult your healthcare provider or pharmacist prior to using any new prescription or non-prescription medications, including herbals, vitamins, non-steroidal anti-inflammatory drugs (NSAIDs) and supplements.  This website has more information on Eliquis (apixaban): http://www.eliquis.com/eliquis/home

## 2022-09-11 NOTE — Progress Notes (Signed)
PROGRESS NOTE   Subjective/Complaints:  Patient without complaints today has some urinary urgency working with physical therapy Review of systems negative chest pain shortness of breath nausea vomiting diarrhea  Objective:   No results found. Recent Labs    09/10/22 0127 09/11/22 0547  WBC 6.9 7.1  HGB 7.7* 7.7*  HCT 23.6* 24.3*  PLT 296 278   Recent Labs    09/10/22 0127 09/11/22 0547  NA 134* 135  K 4.2 4.5  CL 100 102  CO2 27 24  GLUCOSE 99 86  BUN 32* 29*  CREATININE 0.98 1.05  CALCIUM 8.5* 8.4*    Intake/Output Summary (Last 24 hours) at 09/11/2022 1311 Last data filed at 09/11/2022 0900 Gross per 24 hour  Intake 180 ml  Output 700 ml  Net -520 ml     Pressure Injury 08/25/22 Heel Left Deep Tissue Pressure Injury - Purple or maroon localized area of discolored intact skin or blood-filled blister due to damage of underlying soft tissue from pressure and/or shear. 6.5cm x 5.5cm (Active)  08/25/22 0730  Location: Heel  Location Orientation: Left  Staging: Deep Tissue Pressure Injury - Purple or maroon localized area of discolored intact skin or blood-filled blister due to damage of underlying soft tissue from pressure and/or shear.  Wound Description (Comments): 6.5cm x 5.5cm  Present on Admission: Yes     Pressure Injury 08/25/22 Buttocks Right Stage 2 -  Partial thickness loss of dermis presenting as a shallow open injury with a red, pink wound bed without slough. (Active)  08/25/22   Location: Buttocks  Location Orientation: Right  Staging: Stage 2 -  Partial thickness loss of dermis presenting as a shallow open injury with a red, pink wound bed without slough.  Wound Description (Comments):   Present on Admission: Yes     Pressure Injury 09/10/22 Sacrum Medial Stage 2 -  Partial thickness loss of dermis presenting as a shallow open injury with a red, pink wound bed without slough. Elongated area  over sacrum (Active)  09/10/22 1912  Location: Sacrum  Location Orientation: Medial  Staging: Stage 2 -  Partial thickness loss of dermis presenting as a shallow open injury with a red, pink wound bed without slough.  Wound Description (Comments): Elongated area over sacrum  Present on Admission: Yes    Physical Exam: Vital Signs Blood pressure (!) 121/58, pulse (!) 51, temperature 98.4 F (36.9 C), temperature source Oral, resp. rate 18, height 5\' 7"  (1.702 m), weight 86.9 kg, SpO2 100 %.   General: No acute distress Mood and affect are appropriate Heart: Regular rate and rhythm no rubs murmurs or extra sounds Lungs: Clear to auscultation, breathing unlabored, no rales or wheezes Abdomen: Positive bowel sounds, soft nontender to palpation, nondistended Extremities: No clubbing, cyanosis, or edema Skin: No evidence of breakdown, no evidence of rash Neurologic: Cranial nerves II through XII intact, motor strength is 5/5 in bilateral deltoid, bicep, tricep, grip, 3 - bilateral hip flexor, 4/5 knee extensors, ankle dorsiflexor and plantar flexor Sensory exam normal sensation to light touchin bilateral lower extremities  Musculoskeletal: Full range of motion in all 4 extremities. No joint swelling   Assessment/Plan: 1. Functional  deficits which require 3+ hours per day of interdisciplinary therapy in a comprehensive inpatient rehab setting. Physiatrist is providing close team supervision and 24 hour management of active medical problems listed below. Physiatrist and rehab team continue to assess barriers to discharge/monitor patient progress toward functional and medical goals  Care Tool:  Bathing              Bathing assist       Upper Body Dressing/Undressing Upper body dressing        Upper body assist      Lower Body Dressing/Undressing Lower body dressing            Lower body assist       Toileting Toileting    Toileting assist        Transfers Chair/bed transfer  Transfers assist     Chair/bed transfer assist level: 2 Helpers (stedy with +2)     Locomotion Ambulation   Ambulation assist   Ambulation activity did not occur: Safety/medical concerns          Walk 10 feet activity   Assist  Walk 10 feet activity did not occur: Safety/medical concerns        Walk 50 feet activity   Assist Walk 50 feet with 2 turns activity did not occur: Safety/medical concerns         Walk 150 feet activity   Assist Walk 150 feet activity did not occur: Safety/medical concerns         Walk 10 feet on uneven surface  activity   Assist Walk 10 feet on uneven surfaces activity did not occur: Safety/medical concerns         Wheelchair     Assist Is the patient using a wheelchair?: No (Pt left in w/c but did not attempt propulsion or dependent transport.)             Wheelchair 50 feet with 2 turns activity    Assist            Wheelchair 150 feet activity     Assist          Blood pressure (!) 121/58, pulse (!) 51, temperature 98.4 F (36.9 C), temperature source Oral, resp. rate 18, height 5\' 7"  (1.702 m), weight 86.9 kg, SpO2 100 %.  Medical Problem List and Plan: 1. Functional deficits secondary to debility related to sepsis from left 5th MT osteomyelitis and other medical complications. Pt is s/p right 5th ray amputation 5/22.             -TDWB RLE             -patient may not yet shower             -ELOS/Goals: 21-24 days, supervision to min assist goals with PT, OT 2.  Antithrombotics: -DVT/anticoagulation:  Pharmaceutical: Eliquis             -antiplatelet therapy: ASA 81mg  daily 3. Pain Management:  Oxycodone prn.  4. Mood/Behavior/Sleep: LCSW to follow for evaluation and support.              -antipsychotic agents: N/A             -Melatonin prn for insomnia.  5. Neuropsych/cognition: This patient is capable of making decisions on his own behalf. 6.  Skin/Wound Care: Continue wound VAC for now.              -pressure relief and foam dressings to left heel, buttocks wounds. Medihoney  with gauze   under foam dressing for left heel.. 7. Fluids/Electrolytes/Nutrition: Strict I/O w/daily weights             --Add  boost to help w/protein stores and promote healing.              --On zinc and Vitamin C 8. Bacteremia with osteomyelitis: Completed IV antibiotics and s/p Ray amputation             --Mop tx with Augmentin/doxycycline through June 5 9. Hyponatremia/pre-renal azotemia: Na improving form 131-->134. BUN with rise to 32.             --encourage fluid intake and should improve off Lasix.  --hyponatremia resolved off HCTZ. 10. ABLA/B12 deficiency: Continue to monitor. Has high ferritin level with MCV 104. -daily B12 injections until discharge from rehab -transfuse for hgb <7  11. Vitamin B 12 deficiency: Vitam in B 12< 50 with recheck after supplementation > 7,500 12. Fluid overload: Daily wts with strict I/O. Monitor for signs of overload.  --has been on IV lasix 20 mg since 05/08 w/recs to change to to prn. -will change lasix to 20mg  daily prn and monitor weights daily.  --will add low salt diet.  13. Prostate cancer             -monitor voiding patterns             -flomax             -f/u as outpt with Dr. Retta Diones 14. New onset A fib:             -rate is controlled             -transitioned to eliquis by cardiology, continues on ASA as well        LOS: 1 days A FACE TO FACE EVALUATION WAS PERFORMED  [name] 09/11/2022, 1:11 PM

## 2022-09-11 NOTE — Progress Notes (Signed)
Physical Therapy Session Note  Patient Details  Name: Jesse WEIDINGER Sr. MRN: [id number] Date of Birth: 05-31-1928  Today's Date: 09/11/2022 PT Individual Time: 1300-1415 PT Individual Time Calculation (min): 75 min   Short Term Goals: Week 1:  PT Short Term Goal 1 (Week 1): Pt will tolerate sitting up between sessions. PT Short Term Goal 2 (Week 1): Pt will perform STS with + 1 assist PT Short Term Goal 3 (Week 1): Pt will ambulate >5 ft with LRAD  Skilled Therapeutic Interventions/Progress Updates:  pt received in bed and agreeable to therapy. No complaint of pain. Pt reports urge to urinate. Set up and instructed on urinal, only small void with extended time.   Session focused on attempting Sit to stand with stedy. Bed mob with max A for trunk elevation. Pt was able to boost self in bed with BUE on bed rail and trendelenberg position.   Pt performed Sit to stand with stedy and max A x 1 x~6 throughout session with extended rest breaks for fatigue management. Pt able to maintain weight on RLE but unable to achieve full stand d/t posterior bias.   Transitioned to bed level exercise for endurance and functional mobility. Pt performed supine marches, LAQ supported using bed features, and ankle pumps.   At end of session, pt attempted to use urinal with assist but was unable to make it. Therapist assisted with dependent brief change in supine. NT made aware.  Pt remained in bed and was left with all needs in reach and alarm active.   Therapy Documentation Precautions:  Precautions Precautions: Fall Precaution Comments: L heel, R buttocks pressure injuries; HoH Required Braces or Orthoses: Other Brace Other Brace: R post op shoe Restrictions Weight Bearing Restrictions: Yes RLE Weight Bearing: Touchdown weight bearing Other Position/Activity Restrictions: HOH General:     Therapy/Group: Individual Therapy  [name] 09/11/2022, 1:57 PM

## 2022-09-11 NOTE — Plan of Care (Signed)
  Problem: RH Balance Goal: LTG: Patient will maintain dynamic sitting balance (OT) Description: LTG:  Patient will maintain dynamic sitting balance with assistance during activities of daily living (OT) Flowsheets (Taken 09/11/2022 1404) LTG: Pt will maintain dynamic sitting balance during ADLs with: Supervision/Verbal cueing Goal: LTG Patient will maintain dynamic standing with ADLs (OT) Description: LTG:  Patient will maintain dynamic standing balance with assist during activities of daily living (OT)  Flowsheets (Taken 09/11/2022 1404) LTG: Pt will maintain dynamic standing balance during ADLs with: Moderate Assistance - Patient 50 - 74%   Problem: Sit to Stand Goal: LTG:  Patient will perform sit to stand in prep for activites of daily living with assistance level (OT) Description: LTG:  Patient will perform sit to stand in prep for activites of daily living with assistance level (OT) Flowsheets (Taken 09/11/2022 1404) LTG: PT will perform sit to stand in prep for activites of daily living with assistance level: Moderate Assistance - Patient 50 - 74%   Problem: RH Bathing Goal: LTG Patient will bathe all body parts with assist levels (OT) Description: LTG: Patient will bathe all body parts with assist levels (OT) Flowsheets (Taken 09/11/2022 1404) LTG: Pt will perform bathing with assistance level/cueing: Moderate Assistance - Patient 50 - 74% LTG: Position pt will perform bathing:  At sink  Sit to Stand   Problem: RH Dressing Goal: LTG Patient will perform upper body dressing (OT) Description: LTG Patient will perform upper body dressing with assist, with/without cues (OT). Flowsheets (Taken 09/11/2022 1404) LTG: Pt will perform upper body dressing with assistance level of: Set up assist Goal: LTG Patient will perform lower body dressing w/assist (OT) Description: LTG: Patient will perform lower body dressing with assist, with/without cues in positioning using equipment  (OT) Flowsheets (Taken 09/11/2022 1404) LTG: Pt will perform lower body dressing with assistance level of: Moderate Assistance - Patient 50 - 74%   Problem: RH Toileting Goal: LTG Patient will perform toileting task (3/3 steps) with assistance level (OT) Description: LTG: Patient will perform toileting task (3/3 steps) with assistance level (OT)  Flowsheets (Taken 09/11/2022 1404) LTG: Pt will perform toileting task (3/3 steps) with assistance level: Moderate Assistance - Patient 50 - 74%   Problem: RH Toilet Transfers Goal: LTG Patient will perform toilet transfers w/assist (OT) Description: LTG: Patient will perform toilet transfers with assist, with/without cues using equipment (OT) Flowsheets (Taken 09/11/2022 1404) LTG: Pt will perform toilet transfers with assistance level of: Moderate Assistance - Patient 50 - 74%   Problem: RH Tub/Shower Transfers Goal: LTG Patient will perform tub/shower transfers w/assist (OT) Description: LTG: Patient will perform tub/shower transfers with assist, with/without cues using equipment (OT) Flowsheets (Taken 09/11/2022 1404) LTG: Pt will perform tub/shower stall transfers with assistance level of: Moderate Assistance - Patient 50 - 74% LTG: Pt will perform tub/shower transfers from: Tub/shower combination

## 2022-09-11 NOTE — Evaluation (Signed)
Occupational Therapy Assessment and Plan  Patient Details  Name: Jesse VALEK Patterson. MRN: [id number] Date of Birth: 1928-05-29  OT Diagnosis: abnormal posture, cognitive deficits, muscle weakness (generalized), and swelling of limb Rehab Potential: Rehab Potential (ACUTE ONLY): Fair ELOS: 18-21 days   Today's Date: 09/11/2022 OT Individual Time: 4098-1191 OT Individual Time Calculation (min): 68 min     Hospital Problem: Principal Problem:   Debility   Past Medical History:  Past Medical History:  Diagnosis Date   Hypertension    Kidney calculus 2014;2013   Pneumonia    Prostate cancer (HCC)    Shortness of breath    Past Surgical History:  Past Surgical History:  Procedure Laterality Date   AMPUTATION Right 09/08/2022   Procedure: RIGHT FOOT FIFTH RAY AMPUTATION;  Surgeon: [name], MD;  Location: [facility];  Service: Orthopedics;  Laterality: Right;   CATARACT EXTRACTION W/PHACO Right 02/13/2013   Procedure: CATARACT EXTRACTION RIGHT EYE (WITH PHACO) AND INTRAOCULAR LENS PLACEMENT  CDE=14.53;  Surgeon: Loraine Leriche T. Nile Riggs, MD;  Location: [facility];  Service: Ophthalmology;  Laterality: Right;   CATARACT EXTRACTION W/PHACO Left 02/27/2013   Procedure: CATARACT EXTRACTION PHACO AND INTRAOCULAR LENS PLACEMENT (IOC);  Surgeon: Loraine Leriche T. Nile Riggs, MD;  Location: [facility];  Service: Ophthalmology;  Laterality: Left;  CDE:12.54   COLONOSCOPY     JOINT REPLACEMENT Right 1993;1980   Right x2    Assessment & Plan Clinical Impression: [name] is a 89 year old male with history of HTN, prostate cancer, 4 week history of BLE weakness progressing to difficulty walking, SOB with activity, ulcer on right foot as well as developing sacral ulcer. He was admitted for work up on 05/07 and became febrile in ED,developed rigors, lactic acidosis and was started on IVF as well as broad spectrum antibiotics. He was noted to have A fib with bradycardic ventricular response. CTA chest negative for PE and  CT abdomen/pelvis showed moderately distended bladder as well as stool in distal rectum with rectal distension 6.7 cm seen w/fecal impaction.   He was found to have undetectable Vitamin B 12 level w/neuropathy and started on supplementation.    He developed peripheral edema therefore IVF/Lasix d/c.  2 D echo showed EF 65-70% with mild LVH and degenerative MV with mid MVR.  He had sudden rise in WBC the next day to 19.8 but failed attempts at MRI of foot due to excessive movement. Blood cultures X 2 positive for Aerococcus Urinae and 1+ for actinotignum Schaalii. CT foot showed possible osteomyelitis of 5th MTH and antibiotics narrowed to Rocephin on 05/13.  MRI foot done revealing concerwsn of acute osteomyelitis of right great toe MTH as well as mild edema plantar mid foot felt to be nonspecific myositis. Dr. Myra Gianotti and Dr. Lajoyce Corners consulted for input. He was noted to have decreased ABI RLE and patient underwent balloon angioplasty of posterior tibial artery and tibioperoneal trunk on 05/16 by Dr. Karin Lieu.    Dr. Rosemary Holms recommended DOAC when cleared from surgeries and he underwent right 5th toe Ray amputation on 09/08/22 by Dr. Lajoyce Corners and post op to be TDWB on RLE.   PT/OT has been working with patient who has difficulty maintaining TDWB on RLE, has strong posterior lean on standing as well as weakness.  Patient transferred to CIR on 09/10/2022 .    Patient currently requires total with basic self-care skills secondary to muscle weakness, decreased cardiorespiratoy endurance, and decreased sitting balance, decreased standing balance, decreased balance strategies, and  difficulty maintaining precautions.  Prior to hospitalization, patient could complete BADL with min.  Patient will benefit from skilled intervention to decrease level of assist with basic self-care skills prior to discharge home with care partner.  Anticipate patient will require moderate physical assestance and follow up home health.  OT - End  of Session Activity Tolerance: Tolerates 10 - 20 min activity with multiple rests Endurance Deficit: Yes Endurance Deficit Description: Pt fatigued quickly with activity OT Assessment Rehab Potential (ACUTE ONLY): Fair OT Barriers to Discharge: Decreased caregiver support OT Barriers to Discharge Comments: Unsure if family able to provide increased physical support OT Patient demonstrates impairments in the following area(s): Balance;Motor;Sensory;Behavior;Cognition;Edema;Endurance;Safety OT Basic ADL's Functional Problem(s): Bathing;Dressing;Toileting OT Transfers Functional Problem(s): Toilet;Tub/Shower OT Additional Impairment(s): None OT Plan OT Intensity: Minimum of 1-2 x/day, 45 to 90 minutes OT Frequency: 5 out of 7 days OT Duration/Estimated Length of Stay: 18-21 days OT Treatment/Interventions: Balance/vestibular training;Disease mangement/prevention;Patient/family education;Self Care/advanced ADL retraining;Therapeutic Exercise;UE/LE Coordination activities;Wheelchair propulsion/positioning;Discharge planning;DME/adaptive equipment instruction;Functional mobility training;Skin care/wound managment;Therapeutic Activities;UE/LE Strength taining/ROM OT Self Feeding Anticipated Outcome(s): Set up assist OT Basic Self-Care Anticipated Outcome(s): Mod assist OT Toileting Anticipated Outcome(s): Mod assist OT Bathroom Transfers Anticipated Outcome(s): Mod assist OT Recommendation Patient destination: Home Follow Up Recommendations: Home health OT Equipment Recommended: To be determined   OT Evaluation Precautions/Restrictions  Precautions Precautions: Fall Precaution Comments: L heel, R buttocks pressure injuries; HoH Required Braces or Orthoses: Other Brace Other Brace: R post op shoe Restrictions Weight Bearing Restrictions: Yes RLE Weight Bearing: Touchdown weight bearing Other Position/Activity Restrictions: HOH  Pain Pain Assessment Pain Score: 0-No pain Home  Living/Prior Functioning Home Living Family/patient expects to be discharged to:: Private residence Living Arrangements: Children Available Help at Discharge: Family, Available PRN/intermittently, Other (Comment), Friend(s) Type of Home: House Home Access: Ramped entrance Home Layout: Able to live on main level with bedroom/bathroom, Laundry or work area in basement [employer] Shower/Tub: Engineer, manufacturing systems: Handicapped height Bathroom Accessibility: Yes Additional Comments: Information is per pt.  Lives With: Daughter IADL History Homemaking Responsibilities: No Current License: No Leisure and Hobbies: active in his church Prior Function Level of Independence: Needs assistance with ADLs Bath: Minimal Dressing: Minimal  Able to Take Stairs?: No Driving: No Vocation: Retired Administrator, sports Baseline Vision/History: 1 Wears glasses Ability to See in Adequate Light: 1 Impaired Patient Visual Report: Other (comment) Vision Assessment?: Vision impaired- to be further tested in functional context Additional Comments: low vision - has cataract R eye Perception  Perception: Within Functional Limits Praxis Praxis: Intact Cognition Cognition Overall Cognitive Status: Impaired/Different from baseline Arousal/Alertness: Awake/alert Orientation Level: Person;Place Person: Oriented Place: Oriented Situation:  (Patient indicated he had cancer in his toe) Memory: Impaired Memory Impairment: Retrieval deficit;Decreased short term memory Decreased Short Term Memory: Verbal complex Attention: Selective Selective Attention: Appears intact Awareness: Impaired Awareness Impairment: Emergent impairment Problem Solving: Impaired Problem Solving Impairment: Functional basic Behaviors: Other (comment) (fearful of movement - reports lack of confidence) Safety/Judgment: Appears intact Brief Interview for Mental Status (BIMS) Repetition of Three Words (First Attempt): 3 Temporal  Orientation: Year: Correct Temporal Orientation: Month: Accurate within 5 days Temporal Orientation: Day: Incorrect Recall: "Sock": Yes, no cue required Recall: "Blue": Yes, no cue required Recall: "Bed": Yes, no cue required BIMS Summary Score: 14 Sensation Sensation Light Touch: Appears Intact (BUE) Hot/Cold: Appears Intact Proprioception: Impaired by gross assessment Stereognosis: Not tested Additional Comments: Very strong posterior bias in wheelchair - due to fatigue likely versus proprioceptive impairment Coordination Gross Motor Movements are  Fluid and Coordinated: No Fine Motor Movements are Fluid and Coordinated: No Coordination and Movement Description: Grossly dyscoordinated d/t debility and amputation, BUE intention tremors Motor  Motor Motor: Abnormal postural alignment and control Motor - Skilled Clinical Observations: gross weakness and debility  Trunk/Postural Assessment  Cervical Assessment Cervical Assessment: Within Functional Limits Thoracic Assessment Thoracic Assessment: Within Functional Limits Lumbar Assessment Lumbar Assessment: Within Functional Limits Postural Control Postural Control: Deficits on evaluation Trunk Control: inability to sustain upright trunk for transfer/ transitional movement  Balance Balance Balance Assessed: Yes Static Sitting Balance Static Sitting - Balance Support: Feet supported Static Sitting - Level of Assistance: 4: Min assist Dynamic Sitting Balance Dynamic Sitting - Balance Support: Feet unsupported;No upper extremity supported Dynamic Sitting - Level of Assistance: 4: Min assist Sitting balance - Comments: posterior bias, but no LOB Static Standing Balance Static Standing - Balance Support: During functional activity Static Standing - Level of Assistance: 1: +2 Total assist Extremity/Trunk Assessment RUE Assessment RUE Assessment: Within Functional Limits Active Range of Motion (AROM) Comments: Can achieve  overhead reach General Strength Comments: 4-/5 LUE Assessment LUE Assessment: Within Functional Limits Active Range of Motion (AROM) Comments: can achieve overhead reach General Strength Comments: 4-/5  Care Tool Care Tool Self Care Eating   Eating Assist Level: Minimal Assistance - Patient > 75%    Oral Care    Oral Care Assist Level: Minimal Assistance - Patient > 75%    Bathing   Body parts bathed by patient: Right arm;Left arm;Chest;Abdomen;Right upper leg;Left upper leg;Face Body parts bathed by helper: Right lower leg;Left lower leg;Buttocks;Front perineal area   Assist Level: 2 Helpers    Upper Body Dressing(including orthotics)   What is the patient wearing?: Pull over shirt   Assist Level: Moderate Assistance - Patient 50 - 74%    Lower Body Dressing (excluding footwear)   What is the patient wearing?: Pants;Incontinence brief Assist for lower body dressing: 2 Helpers    Putting on/Taking off footwear   What is the patient wearing?: Socks;Orthosis Assist for footwear: Dependent - Patient 0%       Care Tool Toileting Toileting activity   Assist for toileting: 2 Helpers     Care Tool Bed Mobility Roll left and right activity   Roll left and right assist level: Maximal Assistance - Patient 25 - 49%    Sit to lying activity   Sit to lying assist level: Maximal Assistance - Patient 25 - 49%    Lying to sitting on side of bed activity   Lying to sitting on side of bed assist level: the ability to move from lying on the back to sitting on the side of the bed with no back support.: Maximal Assistance - Patient 25 - 49%     Care Tool Transfers Sit to stand transfer   Sit to stand assist level: 2 Helpers    Chair/bed transfer   Chair/bed transfer assist level: 2 Chief Strategy Officer transfer activity did not occur: Safety/medical concerns       Care Tool Cognition  Expression of Ideas and Wants Expression of Ideas and Wants: 3. Some  difficulty - exhibits some difficulty with expressing needs and ideas (e.g, some words or finishing thoughts) or speech is not clear  Understanding Verbal and Non-Verbal Content Understanding Verbal and Non-Verbal Content: 3. Usually understands - understands most conversations, but misses some part/intent of message. Requires cues at times to understand   Memory/Recall Ability Memory/Recall Ability :  Current season;That he or she is in a hospital/hospital unit   Refer to Care Plan for Long Term Goals  SHORT TERM GOAL WEEK 1 OT Short Term Goal 1 (Week 1): Patient will flex forward to reach to feet while seated in wheelchair with min assist OT Short Term Goal 2 (Week 1): Patient will transition from an elevated surface sitting to partial standing (as needed for dressing) with max assist OT Short Term Goal 3 (Week 1): In preparation for toileting, patient will complete squat pivot transfer with max assist OT Short Term Goal 4 (Week 1): Patient will don/doff upper body clothing with set up assistance and increased time.  Recommendations for other services: None    Skilled Therapeutic Intervention Patient received seated in wheelchair with strong posterior bias.  Repositioned patient frequently during session.  Patient having difficulty flexiong forward at hips to reach faucet in sitting.  ADL status as indicated below.  Patient unable to achieve standing with two caregivers this session.  Transferred back to bed via squat pivot to complete LB bathing and dressing.   ADL ADL Eating: Minimal assistance Where Assessed-Eating: Chair Grooming: Minimal assistance Where Assessed-Grooming: Sitting at sink Upper Body Bathing: Minimal assistance Where Assessed-Upper Body Bathing: Sitting at sink;Wheelchair Lower Body Bathing: Dependent Where Assessed-Lower Body Bathing: Bed level Upper Body Dressing: Moderate assistance Where Assessed-Upper Body Dressing: Wheelchair Lower Body Dressing:  Dependent Where Assessed-Lower Body Dressing: Bed level Toileting: Dependent Where Assessed-Toileting: Bed level Toilet Transfer: Dependent Toilet Transfer Method: Squat pivot Toilet Transfer Equipment: Drop arm bedside commode Tub/Shower Transfer: Unable to assess Tub/Shower Transfer Method: Unable to assess Film/video editor: Unable to assess Film/video editor Method: Unable to assess ADL Comments: Wound vac on RLE - Not allowed to shower.  Unable to achieve full standing, patient unable to maintain TDWB RLE Mobility  Bed Mobility Bed Mobility: Sit to Supine;Rolling Right;Rolling Left Rolling Right: Maximal Assistance - Patient 25-49% Rolling Left: Maximal Assistance - Patient 25-49% Supine to Sit: Maximal Assistance - Patient - Patient 25-49% Sit to Supine: Maximal Assistance - Patient 25-49% Transfers Sit to Stand: 2 Helpers   Discharge Criteria: Patient will be discharged from OT if patient refuses treatment 3 consecutive times without medical reason, if treatment goals not met, if there is a change in medical status, if patient makes no progress towards goals or if patient is discharged from hospital.  The above assessment, treatment plan, treatment alternatives and goals were discussed and mutually agreed upon: by patient  Collier Salina 09/11/2022, 1:52 PM

## 2022-09-11 NOTE — Evaluation (Signed)
Physical Therapy Assessment and Plan  Patient Details  Name: Jesse TABRON Patterson. MRN: [id number] Date of Birth: May 03, 1928  PT Diagnosis: Abnormal posture, Coordination disorder, Difficulty walking, Edema, Impaired sensation, Low back pain, Muscle spasms, and Muscle weakness Rehab Potential: Good ELOS: 3-4 weeks   Today's Date: 09/11/2022 PT Individual Time: 0900-1000 PT Individual Time Calculation (min): 60 min    Hospital Problem: Principal Problem:   Debility   Past Medical History:  Past Medical History:  Diagnosis Date   Hypertension    Kidney calculus 2014;2013   Pneumonia    Prostate cancer (HCC)    Shortness of breath    Past Surgical History:  Past Surgical History:  Procedure Laterality Date   AMPUTATION Right 09/08/2022   Procedure: RIGHT FOOT FIFTH RAY AMPUTATION;  Surgeon: [name], MD;  Location: [facility];  Service: Orthopedics;  Laterality: Right;   CATARACT EXTRACTION W/PHACO Right 02/13/2013   Procedure: CATARACT EXTRACTION RIGHT EYE (WITH PHACO) AND INTRAOCULAR LENS PLACEMENT  CDE=14.53;  Surgeon: Loraine Leriche T. Nile Riggs, MD;  Location: [facility];  Service: Ophthalmology;  Laterality: Right;   CATARACT EXTRACTION W/PHACO Left 02/27/2013   Procedure: CATARACT EXTRACTION PHACO AND INTRAOCULAR LENS PLACEMENT (IOC);  Surgeon: Loraine Leriche T. Nile Riggs, MD;  Location: [facility];  Service: Ophthalmology;  Laterality: Left;  CDE:12.54   COLONOSCOPY     JOINT REPLACEMENT Right 1993;1980   Right x2    Assessment & Plan Clinical Impression: [name] is a 89 year old male with history of HTN, prostate cancer (Dr. Retta Diones), 4 week history of BLE weakness progressing to difficulty walking, SOB with activity, ulcer on right foot as well as developing sacral ulcer. He was admitted for work up on 05/07 and became febrile in ED,developed rigors, lactic acidosis and was started on IVF as well as broad spectrum antibiotics. He was noted to have A fib with bradycardic ventricular response. CTA  chest negative for PE and CT abdomen/pelvis showed moderately distended bladder as well as stool in distal rectum with rectal distension 6.7 cm seen w/fecal impaction.   He was found to have undetectable Vitamin B 12 level w/neuropathy and started on supplementation.    He developed peripheral edema therefore IVF/Lasix d/c.  2 D echo showed EF 65-70% with mild LVH and degenerative MV with mid MVR.  He had sudden rise in WBC the next day to 19.8 but failed attempts at MRI of foot due to excessive movement. Blood cultures X 2 positive for Aerococcus Urinae and 1+ for actinotignum Schaalii. CT foot showed possible osteomyelitis of 5th MTH and antibiotics narrowed to Rocephin on 05/13.  MRI foot done revealing concerwsn of acute osteomyelitis of right great toe MTH as well as mild edema plantar mid foot felt to be nonspecific myositis. Dr. Myra Gianotti and Dr. Lajoyce Corners consulted for input. He was noted to have decreased ABI RLE and patient underwent balloon angioplasty of posterior tibial artery and tibioperoneal trunk on 05/16 by Dr. Karin Lieu.    Dr. Rosemary Holms recommended DOAC when cleared from surgeries. Antibiotics held prior ot surgery and he underwent right 5th toe Ray amputation on 09/08/22 by Dr. Lajoyce Corners and post op to be TDWB on RLE. He was transitioned from IV heparin to Eliquis on 05/23.  He has completed antibiotic regimen for bacteremia most likely from urinary source and as post amputation; ID recommends 2 weeks of empiric Doxycyline (with esophagitis precautions) and Augmentin "to mop up any residual infection". Leucocytosis has resolved and ABLA stable. PT/OT has been  working with patient who has difficulty maintaining TDWB on RLE, has strong posterior lean on standing as well as weakness. CIR recommended due to functional decline.   Patient transferred to CIR on 09/10/2022 .   Patient currently requires max with mobility secondary to muscle weakness, decreased cardiorespiratoy endurance, abnormal tone and  unbalanced muscle activation, and decreased sitting balance, decreased standing balance, decreased postural control, decreased balance strategies, and difficulty maintaining precautions.  Prior to hospitalization, patient was min with mobility and lived with Daughter in a House home.  Home access is  Ramped entrance.  Patient will benefit from skilled PT intervention to maximize safe functional mobility, minimize fall risk, and decrease caregiver burden for planned discharge home with 24 hour supervision.  Anticipate patient will benefit from follow up HH at discharge.  PT - End of Session Activity Tolerance: Tolerates 10 - 20 min activity with multiple rests Endurance Deficit: Yes Endurance Deficit Description: Pt fatigued quickly with activity PT Assessment Rehab Potential (ACUTE/IP ONLY): Good PT Barriers to Discharge: Inaccessible home environment;Decreased caregiver support;Home environment access/layout;Wound Care;Incontinence;Weight bearing restrictions PT Patient demonstrates impairments in the following area(s): Balance;Pain;Safety;Edema;Endurance;Sensory;Skin Integrity;Motor PT Transfers Functional Problem(s): Bed Mobility;Bed to Chair;Car PT Locomotion Functional Problem(s): Ambulation;Wheelchair Mobility;Stairs PT Plan PT Intensity: Minimum of 1-2 x/day ,45 to 90 minutes PT Frequency: 5 out of 7 days PT Duration Estimated Length of Stay: 3-4 weeks PT Treatment/Interventions: Ambulation/gait training;Cognitive remediation/compensation;Discharge planning;DME/adaptive equipment instruction;Functional mobility training;Pain management;Psychosocial support;Splinting/orthotics;UE/LE Strength taining/ROM;Visual/perceptual remediation/compensation;Therapeutic Activities;Wheelchair propulsion/positioning;UE/LE Coordination activities;Therapeutic Exercise;Stair training;Skin care/wound management;Patient/family education;Neuromuscular re-education;Disease management/prevention;Functional  electrical stimulation;Community reintegration;Balance/vestibular training PT Transfers Anticipated Outcome(s): min A with LRAD PT Locomotion Anticipated Outcome(s): Min a with LRAD, supervision w/c PT Recommendation Follow Up Recommendations: Home health PT Patient destination: Home Equipment Recommended: To be determined   PT Evaluation Precautions/Restrictions Precautions Precautions: Fall Precaution Comments: L heel, R buttocks pressure injuries; HoH Required Braces or Orthoses: Other Brace Other Brace: R post op shoe Restrictions Weight Bearing Restrictions: Yes RLE Weight Bearing: Touchdown weight bearing Other Position/Activity Restrictions: HOH General   Vital Signs Pain   Pain Interference   Home Living/Prior Functioning Home Living Available Help at Discharge: Family;Available PRN/intermittently;Other (Comment);Friend(s) (Daughter stays with him as primary caregiver and friends helps at times) Type of Home: House Home Access: Ramped entrance Home Layout: Able to live on main level with bedroom/bathroom;Laundry or work area in [employer] With: Daughter Prior Function Level of Independence: Independent with gait;Independent with transfers;Requires assistive device for independence  Able to Take Stairs?: No Driving: No Vocation: Retired Optometrist - History Ability to See in Adequate Light: 1 Impaired Perception Perception: Within Functional Limits Praxis Praxis: Intact  Cognition Overall Cognitive Status: Within Functional Limits for tasks assessed Orientation Level: Oriented X4 Year: 2024 Month: May Memory: Impaired (pt reports "more so than usual") Safety/Judgment: Appears intact Sensation Sensation Light Touch: Impaired Detail Peripheral sensation comments: Pt reports good sensation, but questionable d/t history Hot/Cold: Not tested Proprioception: Impaired by gross assessment Stereognosis: Not tested Coordination Gross Motor  Movements are Fluid and Coordinated: No Coordination and Movement Description: Grossly dyscoordinated d/t debility and amputation Motor  Motor Motor: Other (comment) Motor - Skilled Clinical Observations: gross weakness and debility   Trunk/Postural Assessment  Cervical Assessment Cervical Assessment: Within Functional Limits Thoracic Assessment Thoracic Assessment: Within Functional Limits Lumbar Assessment Lumbar Assessment: Within Functional Limits Postural Control Postural Control: Deficits on evaluation  Balance Balance Balance Assessed: Yes Static Sitting Balance Static Sitting - Balance Support: Feet supported Static Sitting - Level  of Assistance: 5: Stand by assistance Dynamic Sitting Balance Dynamic Sitting - Level of Assistance: 4: Min assist Static Standing Balance Static Standing - Balance Support: During functional activity Static Standing - Level of Assistance: 1: +2 Total assist Extremity Assessment      RLE Assessment RLE Assessment: Exceptions to [facility] General Strength Comments: grossly 3+/5 LLE Assessment LLE Assessment: Exceptions to [facility] General Strength Comments: grossly 3+/5  Care Tool Care Tool Bed Mobility Roll left and right activity   Roll left and right assist level: Maximal Assistance - Patient 25 - 49%    Sit to lying activity   Sit to lying assist level: Maximal Assistance - Patient 25 - 49%    Lying to sitting on side of bed activity   Lying to sitting on side of bed assist level: the ability to move from lying on the back to sitting on the side of the bed with no back support.: Maximal Assistance - Patient 25 - 49%     Care Tool Transfers Sit to stand transfer   Sit to stand assist level: 2 Helpers    Chair/bed transfer   Chair/bed transfer assist level: 2 Helpers (stedy with +2)     Scientist, research (physical sciences) transfer activity did not occur: Safety/medical concerns        Care Tool Locomotion Ambulation  Ambulation activity did not occur: Safety/medical concerns        Walk 10 feet activity Walk 10 feet activity did not occur: Safety/medical concerns       Walk 50 feet with 2 turns activity Walk 50 feet with 2 turns activity did not occur: Safety/medical concerns      Walk 150 feet activity Walk 150 feet activity did not occur: Safety/medical concerns      Walk 10 feet on uneven surfaces activity Walk 10 feet on uneven surfaces activity did not occur: Safety/medical concerns      Stairs Stair activity did not occur: Safety/medical concerns        Walk up/down 1 step activity Walk up/down 1 step or curb (drop down) activity did not occur: Safety/medical concerns      Walk up/down 4 steps activity Walk up/down 4 steps activity did not occur: Safety/medical concerns      Walk up/down 12 steps activity Walk up/down 12 steps activity did not occur: Safety/medical concerns      Pick up small objects from floor Pick up small object from the floor (from standing position) activity did not occur: Safety/medical concerns      Wheelchair Is the patient using a wheelchair?: No (Pt left in w/c but did not attempt propulsion or dependent transport.)          Wheel 50 feet with 2 turns activity      Wheel 150 feet activity        Refer to Care Plan for Long Term Goals  SHORT TERM GOAL WEEK 1 PT Short Term Goal 1 (Week 1): Pt will tolerate sitting up between sessions. PT Short Term Goal 2 (Week 1): Pt will perform STS with + 1 assist PT Short Term Goal 3 (Week 1): Pt will ambulate >5 ft with LRAD  Recommendations for other services: None   Skilled Therapeutic Intervention Evaluation completed (see details above) with patient education regarding purpose of PT evaluation, PT POC and goals, therapy schedule, weekly team meetings, and other CIR information including safety plan and fall risk safety. Subjective exam limited  as pt is HOH. Pt required max x 2 for Sit to stand in stedy,  with noted limited hip and knee extension. Pt performed the below functional mobility tasks with the specified levels of skilled cuing and assistance.  Pt remained seated in w/c at end of session and was left with all needs in reach and alarm active.   Mobility Bed Mobility Bed Mobility: Supine to Sit Supine to Sit: Maximal Assistance - Patient - Patient 25-49% Transfers Transfers: Sit to [employer] via Financial trader Sit to Stand: 2 Advertising account planner (Assistive device): Other (Comment) (stedy) Transfer via Lift Equipment: Engineering geologist: No Gait Gait: No Stairs / Additional Locomotion Stairs: No Naval architect Mobility: No   Discharge Criteria: Patient will be discharged from PT if patient refuses treatment 3 consecutive times without medical reason, if treatment goals not met, if there is a change in medical status, if patient makes no progress towards goals or if patient is discharged from hospital.  The above assessment, treatment plan, treatment alternatives and goals were discussed and mutually agreed upon: by patient  [name] 09/11/2022, 1:03 PM

## 2022-09-12 NOTE — Progress Notes (Addendum)
Physical Therapy Session Note  Patient Details  Name: Jesse LOVEDAY Sr. MRN: [id number] Date of Birth: January 13, 1929  Today's Date: 09/12/2022 PT Individual Time: 1300-1400 PT Individual Time Calculation (min): 60 min   Short Term Goals: Week 1:  PT Short Term Goal 1 (Week 1): Pt will tolerate sitting up between sessions. PT Short Term Goal 2 (Week 1): Pt will perform STS with + 1 assist PT Short Term Goal 3 (Week 1): Pt will ambulate >5 ft with LRAD  Skilled Therapeutic Interventions/Progress Updates: Pt presented in recliner agreeable to therapy, pt noted to have offloading shoe on during session. Pt denies pain at rest. Session focused on standing for BLE strengthening. Performed anterior scooting to edge of recliner with minA and set up with Stedy. Performed Sit to stand with Stedy modA x 1, pt then indicated urgency for urinary void (purewick had been removed), due to urgency purewick tubing attached with pt able to void. Pt able to verbalize that he is TDWB through RLE however (?) increased weight through RLE when standing. Pt transferred to w/c and transported to main gym for time management. PTA obtained RW and educated pt on technique and to push from w/c. On first attempt to with strong posterior lean with pt unable to achieve full standing. On second attempt pt able to come to complete stand with modA with less of posterior lean. Pt then indicated urgency for void therefore pt taken back to room. Pt set up with Stedy and performed Sit to stand from w/c with Stedy with modA. While sitting on perch pt set up for brief change and was able to stand from perch with modA and increased L lean noted. Pt was able to tolerate standing to allow PTA to pull up brief and pants. Pt then returned to sitting EOB. Required modA for sit to supine primarily for BLE management. Pt was repositioned to comfort and left with bed alarm on, call bell within reach and needs met.      Therapy Documentation Precautions:   Precautions Precautions: Fall Precaution Comments: L heel, R buttocks pressure injuries; HoH Required Braces or Orthoses: Other Brace Other Brace: R post op shoe Restrictions Weight Bearing Restrictions: Yes RLE Weight Bearing: Touchdown weight bearing Other Position/Activity Restrictions: HOH General:   Vital Signs: Therapy Vitals Temp: 98.4 F (36.9 C) Pulse Rate: (!) 57 Resp: 19 BP: 134/67 Patient Position (if appropriate): Lying Oxygen Therapy SpO2: 100 % O2 Device: Room Air Pain:   Mobility:   Locomotion :    Trunk/Postural Assessment :    Balance:   Exercises:   Other Treatments:      Therapy/Group: Individual Therapy  Lesbia Ottaway 09/12/2022, 4:43 PM

## 2022-09-12 NOTE — Progress Notes (Signed)
Occupational Therapy Session Note  Patient Details  Name: NISCHAL OCH Sr. MRN: [id number] Date of Birth: January 04, 1929  Today's Date: 09/12/2022 OT Individual Time: 0905-1000 OT Individual Time Calculation (min): 55 min    Short Term Goals: Week 1:  OT Short Term Goal 1 (Week 1): Patient will flex forward to reach to feet while seated in wheelchair with min assist OT Short Term Goal 2 (Week 1): Patient will transition from an elevated surface sitting to partial standing (as needed for dressing) with max assist OT Short Term Goal 3 (Week 1): In preparation for toileting, patient will complete squat pivot transfer with max assist OT Short Term Goal 4 (Week 1): Patient will don/doff upper body clothing with set up assistance and increased time.  Skilled Therapeutic Interventions/Progress Updates:  Pt received resting in bed for skilled OT session with focus on OOB activity tolerance. Pt agreeable to interventions, demonstrating overall pleasant mood. Pt with no reports of pain. OT offering intermediate rest breaks and positioning suggestions throughout session to address pain/fatigue and maximize participation/safety in session.  Pt dependent for threading pants at bed-level before coming to EOB. Pt requires Mod A + heavy use of bed features to reach seated position, continuing to rely on bed-rails to maintain seated balance. Stedy placed and bed elevated, pt requiring x2 trials to reach stance with Max A + cuing for forward flexion and once in upright position, standing posture. Pt dependent for brief change, tolerating x3 STS on stedy to assist, requiring extended rest-breaks to continue participating in session.  Pt then rolled on stedy towards recliner, requiring Max A to reach seated position. Pt requires Mod-Max A and increased time to scoot hips towards back of recliner.   Pt remained resting in recliner with all immediate needs met at end of session. Pt continues to be appropriate for  skilled OT intervention to promote further functional independence.   Therapy Documentation Precautions:  Precautions Precautions: Fall Precaution Comments: L heel, R buttocks pressure injuries; HoH Required Braces or Orthoses: Other Brace Other Brace: R post op shoe Restrictions Weight Bearing Restrictions: Yes RLE Weight Bearing: Touchdown weight bearing Other Position/Activity Restrictions: HOH   Therapy/Group: Individual Therapy  Lou Cal, OTR/L, MSOT  09/12/2022, 5:27 AM

## 2022-09-13 LAB — BASIC METABOLIC PANEL
Anion gap: 6 (ref 5–15)
BUN: 29 mg/dL — ABNORMAL HIGH (ref 8–23)
CO2: 24 mmol/L (ref 22–32)
Calcium: 8.3 mg/dL — ABNORMAL LOW (ref 8.9–10.3)
Chloride: 103 mmol/L (ref 98–111)
Creatinine, Ser: 1.01 mg/dL (ref 0.61–1.24)
GFR, Estimated: >60 mL/min (ref >60)
Glucose, Bld: 102 mg/dL — ABNORMAL HIGH (ref 70–99)
Potassium: 4.6 mmol/L (ref 3.5–5.1)
Sodium: 133 mmol/L — ABNORMAL LOW (ref 135–145)

## 2022-09-13 NOTE — Progress Notes (Addendum)
Patient ID: Jesse Pollock Sr., male   DOB: 12/16/1928, 89 y.o.   MRN: [id number]  1105- SW left message for pt dtr [name] on cell phone to introduce self, explain role, discuss discharge process, and inform on ELOS. SW requested return call to confirm d/c plan is to return to her home. SW made efforts to make contact with her on home # listed in Epic ([phone]), however the voicemail is not set up.   *SW left statement of service in room. SW will complete assessment with family as reported in pre-admission screen, pt is dependent for cognition needs.   1117- SW received return phone call from pt dtr [name]. SW and dtr discussed in length the current care she has been providing. Reports that she is not able to physically assist him into the bed, so her son, and her son's father have been coming over to do this. Reports she is has been  his sole caregiver. Discussed the likelihood of continued Min Asst-Mod Asst. And scheduling family edu closer towards discharge to make a more informed decision. SW will provide updates after team conference.   Cecile Sheerer, MSW, LCSWA Office: [phone] Cell: [phone] Fax: [phone]

## 2022-09-13 NOTE — Progress Notes (Signed)
Occupational Therapy Session Note  Patient Details  Name: Jesse ROOSEVELT Sr. MRN: [id number] Date of Birth: [birth year]  Today's Date: 09/13/2022 OT Individual Time: 4098-1191 OT Individual Time Calculation (min): 55 min    Short Term Goals: Week 1:  OT Short Term Goal 1 (Week 1): Patient will flex forward to reach to feet while seated in wheelchair with min assist OT Short Term Goal 2 (Week 1): Patient will transition from an elevated surface sitting to partial standing (as needed for dressing) with max assist OT Short Term Goal 3 (Week 1): In preparation for toileting, patient will complete squat pivot transfer with max assist OT Short Term Goal 4 (Week 1): Patient will don/doff upper body clothing with set up assistance and increased time.  Skilled Therapeutic Interventions/Progress Updates:    OT intervention with focus on bed mobility, sitting balance, sit<>stand in Stedy, LB dressing, and activity tolerance to increase independence with BADLs. Supine>sit EOB with mod A. Pt able to scoot to EOB with supervision. Sitting balance EOB with supervision. Pt independently verbalized RLE WBing precautions. Sit<>stand in [place] with min A. Pt requuested to used urinal. Max A for clothing mgmt while standing in [place] and for urinal mgmt. Standing balance in [place] with min A for clothing mgmt. Pt transferred to recliner with Stedy. Pt requires more then a reasonable amount of time for all tasks. Pt SOB with sit<>stand and standing. Pt remained seated in recliner. All needs within reach. Wound Vac plugged in and functioning.   Therapy Documentation Precautions:  Precautions Precautions: Fall Precaution Comments: L heel, R buttocks pressure injuries; HoH Required Braces or Orthoses: Other Brace Other Brace: R post op shoe Restrictions Weight Bearing Restrictions: Yes RLE Weight Bearing: Touchdown weight bearing Other Position/Activity Restrictions: HOH Pain:  Pt reports his Rt hip "bothers" him  sometines but ok for now   Therapy/Group: Individual Therapy  [name] 09/13/2022, 9:11 AM

## 2022-09-13 NOTE — Care Management (Signed)
[facility] Individual Statement of Services  Patient Name:  Jesse MAYON Sr.  Date:  09/13/2022  Welcome to the [facility]patient [facility].  Our goal is to provide you with an individualized program based on your diagnosis and situation, designed to meet your specific needs.  With this comprehensive rehabilitation program, you will be expected to participate in at least 3 hours of rehabilitation therapies Monday-Friday, with modified therapy programming on the weekends.  Your rehabilitation program will include the following services:  Physical Therapy (PT), Occupational Therapy (OT), 24 hour per day rehabilitation nursing, Therapeutic Recreaction (TR), Psychology, Neuropsychology, Care Coordinator, Rehabilitation Medicine, Nutrition Services, Pharmacy Services, and Other  Weekly team conferences will be held on Tuesdays to discuss your progress.  Your Inpatient Rehabilitation Care Coordinator will talk with you frequently to get your input and to update you on team discussions.  Team conferences with you and your family in attendance may also be held.  Expected length of stay: 21-30 days   Overall anticipated outcome: Minimal Assistance  Depending on your progress and recovery, your program may change. Your Inpatient Rehabilitation Care Coordinator will coordinate services and will keep you informed of any changes. Your Inpatient Rehabilitation Care Coordinator's name and contact numbers are listed  below.  The following services may also be recommended but are not provided by the In[facility]:  Driving Evaluations Home Health Rehabiltiation Services Outpatient Rehabilitation Services Vocational Rehabilitation   Arrangements will be made to provide these services after discharge if needed.  Arrangements include referral to agencies that provide these services.  Your insurance has been verified to be:  Medicare A/B  Your primary doctor is:   Elfredia Nevins  Pertinent information will be shared with your doctor and your insurance company.  Inpatient Rehabilitation Care Coordinator:  Susie Cassette [phone] or [phone]  Information discussed with and copy given to patient by: Gretchen Short, 09/13/2022, 11:11 AM

## 2022-09-13 NOTE — Progress Notes (Signed)
Physical Therapy Session Note  Patient Details  Name: Jesse WILKS Sr. MRN: [id number] Date of Birth: 07-Oct-1928  Today's Date: 09/13/2022 PT Individual Time: 1000-1115 PT Individual Time Calculation (min): 75 min   Short Term Goals: Week 1:  PT Short Term Goal 1 (Week 1): Pt will tolerate sitting up between sessions. PT Short Term Goal 2 (Week 1): Pt will perform STS with + 1 assist PT Short Term Goal 3 (Week 1): Pt will ambulate >5 ft with LRAD  Skilled Therapeutic Interventions/Progress Updates:    Pt received in recliner and agreeable to therapy.  Pt reports unrated chronic hip pain, premedicated. Rest and positioning provided as needed. At start of session, pt assisted with using urinal for small void, just barely continent d/t timing/urge. Later in session, pt reports urgent need to void, with urge incontinence. Dependent brief change in standing.   Pt then directed in sit/stand from recliner to RW. Cueing for weight bearing status and technique. Pt with difficulty shifting anteriorly to stand, as well as incomplete hip/knee extension. Cueing limited with extensive visual demo d/t pt HOH. Several attempts with seated rest breaks for fatigue management. Pt was able to achieve standing with heavy mod A, but was limited by posterior bias and unable to attempt transfer at this time.   Pt then performed several stands with stedy. Requires mod A and frequent cueing for anterior weight shift and WB status. Pt propelled w/c with BUE with cueing for navigation, x 170 ft for endurance and functional mobility. Participated in LLE kinetron activity 4 x 1.5 min with seated rest breaks for improved LLE strength and endurance.   Pt returned to room and remained in w/c, was left with all needs in reach and alarm active.   Therapy Documentation Precautions:  Precautions Precautions: Fall Precaution Comments: L heel, R buttocks pressure injuries; HoH Required Braces or Orthoses: Other Brace Other  Brace: R post op shoe Restrictions Weight Bearing Restrictions: Yes RLE Weight Bearing: Touchdown weight bearing Other Position/Activity Restrictions: HOH General:   Vital Signs:  Pain:   Mobility:   Locomotion :    Trunk/Postural Assessment :    Balance:   Exercises:   Other Treatments:      Therapy/Group: Individual Therapy  [name] 09/13/2022, 10:15 AM

## 2022-09-13 NOTE — Progress Notes (Signed)
Patients TX to left foot completed as ordered. Washed and dried patients foot with soap and water pat dry, applied medi honey and a new foam dressing. Patient tolerated well. Wound vac continues at 125 continuous with  minimal serosanguinous noted.

## 2022-09-13 NOTE — IPOC Note (Signed)
Overall Plan of Care [facility]) Patient Details Name: [name]. MRN: [id number] DOB: 12-Feb-1929  Admitting Diagnosis: Debility  Hospital Problems: Principal Problem:   Debility     Functional Problem List: Nursing Safety, Bladder, Bowel, Sensory, Skin Integrity, Edema, Endurance, Medication Management, Nutrition, Pain  PT Balance, Pain, Safety, Edema, Endurance, Sensory, Skin Integrity, Motor  OT Balance, Motor, Sensory, Behavior, Cognition, Edema, Endurance, Safety  SLP    TR         Basic ADL's: OT Bathing, Dressing, Toileting     Advanced  ADL's: OT       Transfers: PT Bed Mobility, Bed to Chair, Customer service manager, Tub/Shower     Locomotion: PT Ambulation, Psychologist, prison and probation services, Stairs     Additional Impairments: OT None  SLP        TR      Anticipated Outcomes Item Anticipated Outcome  Self Feeding Set up assist  Swallowing      Basic self-care  Mod assist  Toileting  Mod assist   Bathroom Transfers Mod assist  Bowel/Bladder  time toileting to improve continence  Transfers  min A with LRAD  Locomotion  Min a with LRAD, supervision w/c  Communication     Cognition     Pain  less than 4  Safety/Judgment  fall prevention   Therapy Plan: PT Intensity: Minimum of 1-2 x/day ,45 to 90 minutes PT Frequency: 5 out of 7 days PT Duration Estimated Length of Stay: 3-4 weeks OT Intensity: Minimum of 1-2 x/day, 45 to 90 minutes OT Frequency: 5 out of 7 days OT Duration/Estimated Length of Stay: 18-21 days     Team Interventions: Nursing Interventions Patient/Family Education, Medication Management, Psychosocial Support, Bladder Management, Bowel Management, Skin Care/Wound Management, Disease Management/Prevention, Pain Management, Discharge Planning  PT interventions Ambulation/gait training, Cognitive remediation/compensation, Discharge planning, DME/adaptive equipment instruction, Functional mobility training, Pain management, Psychosocial support,  Splinting/orthotics, UE/LE Strength taining/ROM, Visual/perceptual remediation/compensation, Therapeutic Activities, Wheelchair propulsion/positioning, UE/LE Coordination activities, Therapeutic Exercise, Stair training, Skin care/wound management, Patient/family education, Neuromuscular re-education, Disease management/prevention, Functional electrical stimulation, Community reintegration, Warden/ranger  OT Interventions Warden/ranger, Disease mangement/prevention, Equities trader education, Self Care/advanced ADL retraining, Therapeutic Exercise, UE/LE Coordination activities, Wheelchair propulsion/positioning, Discharge planning, DME/adaptive equipment instruction, Functional mobility training, Skin care/wound managment, Therapeutic Activities, UE/LE Strength taining/ROM  SLP Interventions    TR Interventions    SW/CM Interventions     Barriers to Discharge MD  Medical stability, Home enviroment access/loayout, IV antibiotics, Incontinence, Wound care, Lack of/limited family support, Weight, and Weight bearing restrictions  Nursing Decreased caregiver support, Incontinence, Wound Care, Weight bearing restrictions B/B main level with ramp entrance  PT Inaccessible home environment, Decreased caregiver support, Home environment access/layout, Wound Care, Incontinence, Weight bearing restrictions    OT Decreased caregiver support Unsure if family able to provide increased physical support  SLP      SW       Team Discharge Planning: Destination: PT-Home ,OT- Home , SLP-  Projected Follow-up: PT-Home health PT, OT-  Home health OT, SLP-  Projected Equipment Needs: PT-To be determined, OT- To be determined, SLP-  Equipment Details: PT- , OT-  Patient/family involved in discharge planning: PT- Patient,  OT-Patient, SLP-   MD ELOS: 18-21 days Medical Rehab Prognosis:  Good Assessment: The patient has been admitted for CIR therapies with the diagnosis of debility from  R 5th ray amputation with VAC- TDWB. The team will be addressing functional mobility, strength, stamina, balance, safety, adaptive techniques and equipment,  self-care, bowel and bladder mgt, patient and caregiver education, VAC. Goals have been set at min-mod A. Anticipated discharge destination is home with family.        See Team Conference Notes for weekly updates to the plan of care

## 2022-09-13 NOTE — Progress Notes (Signed)
PROGRESS NOTE   Subjective/Complaints:  Pt reports no pain currently and pain usually just stinging intermittently on R foot.  LBM last evening- no more constipation.   They took purewick off, per pt- and pt concerned about urinary incontinence- since has urge and comes really fast.     ROS:  Pt denies SOB, abd pain, CP, N/V/C/D, and vision changes Except for HPI Objective:   No results found. Recent Labs    09/11/22 0547  WBC 7.1  HGB 7.7*  HCT 24.3*  PLT 278   Recent Labs    09/11/22 0547 09/13/22 0603  NA 135 133*  K 4.5 4.6  CL 102 103  CO2 24 24  GLUCOSE 86 102*  BUN 29* 29*  CREATININE 1.05 1.01  CALCIUM 8.4* 8.3*    Intake/Output Summary (Last 24 hours) at 09/13/2022 7829 Last data filed at 09/13/2022 0600 Gross per 24 hour  Intake 360 ml  Output 1405 ml  Net -1045 ml     Pressure Injury 08/25/22 Heel Left Deep Tissue Pressure Injury - Purple or maroon localized area of discolored intact skin or blood-filled blister due to damage of underlying soft tissue from pressure and/or shear. 6.5cm x 5.5cm (Active)  08/25/22 0730  Location: Heel  Location Orientation: Left  Staging: Deep Tissue Pressure Injury - Purple or maroon localized area of discolored intact skin or blood-filled blister due to damage of underlying soft tissue from pressure and/or shear.  Wound Description (Comments): 6.5cm x 5.5cm  Present on Admission: Yes     Pressure Injury 08/25/22 Buttocks Right Stage 2 -  Partial thickness loss of dermis presenting as a shallow open injury with a red, pink wound bed without slough. (Active)  08/25/22   Location: Buttocks  Location Orientation: Right  Staging: Stage 2 -  Partial thickness loss of dermis presenting as a shallow open injury with a red, pink wound bed without slough.  Wound Description (Comments):   Present on Admission: Yes     Pressure Injury 09/10/22 Sacrum Medial Stage 2 -   Partial thickness loss of dermis presenting as a shallow open injury with a red, pink wound bed without slough. Elongated area over sacrum (Active)  09/10/22 1912  Location: Sacrum  Location Orientation: Medial  Staging: Stage 2 -  Partial thickness loss of dermis presenting as a shallow open injury with a red, pink wound bed without slough.  Wound Description (Comments): Elongated area over sacrum  Present on Admission: Yes    Physical Exam: Vital Signs Blood pressure (!) 121/48, pulse (!) 54, temperature 99.3 F (37.4 C), resp. rate 18, height 5\' 7"  (1.702 m), weight 86.9 kg, SpO2 100 %.    General: awake, alert, appropriate, sitting up in bed; finished tray; 75+% done; NAD HENT: conjugate gaze; oropharynx moist CV: bradycardic rate; an occ skipped beat; no JVD Pulmonary: CTA B/L; no W/R/R- good air movement GI: soft, NT, ND, (+)BS- protuberant Psychiatric: appropriate- flat affect-  Neurological: Ox3- very HOH vs cognitive issues Skin; Has VAC and ACE wrap on R foot Neurologic: Cranial nerves II through XII intact, motor strength is 5/5 in bilateral deltoid, bicep, tricep, grip, 3 - bilateral hip flexor,  4/5 knee extensors, ankle dorsiflexor and plantar flexor Sensory exam normal sensation to light touchin bilateral lower extremities  Musculoskeletal: Full range of motion in all 4 extremities. No joint swelling   Assessment/Plan: 1. Functional deficits which require 3+ hours per day of interdisciplinary therapy in a comprehensive inpatient rehab setting. Physiatrist is providing close team supervision and 24 hour management of active medical problems listed below. Physiatrist and rehab team continue to assess barriers to discharge/monitor patient progress toward functional and medical goals  Care Tool:  Bathing    Body parts bathed by patient: Right arm, Left arm, Chest, Abdomen, Right upper leg, Left upper leg, Face   Body parts bathed by helper: Right lower leg, Left  lower leg, Buttocks, Front perineal area     Bathing assist Assist Level: 2 Helpers     Upper Body Dressing/Undressing Upper body dressing   What is the patient wearing?: Pull over shirt    Upper body assist Assist Level: Moderate Assistance - Patient 50 - 74%    Lower Body Dressing/Undressing Lower body dressing      What is the patient wearing?: Pants, Incontinence brief     Lower body assist Assist for lower body dressing: 2 Helpers     Toileting Toileting    Toileting assist Assist for toileting: 2 Helpers     Transfers Chair/bed transfer  Transfers assist     Chair/bed transfer assist level: 2 Helpers     Locomotion Ambulation   Ambulation assist   Ambulation activity did not occur: Safety/medical concerns          Walk 10 feet activity   Assist  Walk 10 feet activity did not occur: Safety/medical concerns        Walk 50 feet activity   Assist Walk 50 feet with 2 turns activity did not occur: Safety/medical concerns         Walk 150 feet activity   Assist Walk 150 feet activity did not occur: Safety/medical concerns         Walk 10 feet on uneven surface  activity   Assist Walk 10 feet on uneven surfaces activity did not occur: Safety/medical concerns         Wheelchair     Assist Is the patient using a wheelchair?: No (Pt left in w/c but did not attempt propulsion or dependent transport.)             Wheelchair 50 feet with 2 turns activity    Assist            Wheelchair 150 feet activity     Assist          Blood pressure (!) 121/48, pulse (!) 54, temperature 99.3 F (37.4 C), resp. rate 18, height 5\' 7"  (1.702 m), weight 86.9 kg, SpO2 100 %.  Medical Problem List and Plan: 1. Functional deficits secondary to debility related to sepsis from Right 5th MT osteomyelitis and other medical complications. Pt is s/p right 5th ray amputation 5/22.             -TDWB RLE             -patient  may not yet shower due to [facility]             -ELOS/Goals: 21-24 days, supervision to min assist goals with PT, OT  Con't CIR PT and OT- IPOC today 2.  Antithrombotics: -DVT/anticoagulation:  Pharmaceutical: Eliquis             -  antiplatelet therapy: ASA 81mg  daily 3. Pain Management:  Oxycodone prn. 5/27- has very little pain- mainly stinging- con't mainly tylenol prn  4. Mood/Behavior/Sleep: LCSW to follow for evaluation and support.              -antipsychotic agents: N/A             -Melatonin prn for insomnia.  5. Neuropsych/cognition: This patient is capable of making decisions on his own behalf. 6. Skin/Wound Care: Continue wound VAC for now.              -pressure relief and foam dressings to left heel, buttocks wounds. Medihoney with gauze   under foam dressing for left heel..  5/27- will need to check when [facility] can be removed on R foot. - Dr Lajoyce Corners is surgeon 7. Fluids/Electrolytes/Nutrition: Strict I/O w/daily weights             --Add  boost to help w/protein stores and promote healing.              --On zinc and Vitamin C 8. Bacteremia with osteomyelitis: Completed IV antibiotics and s/p Ray amputation             --Mop tx with Augmentin/doxycycline through June 5 9. Hyponatremia/pre-renal azotemia: Na improving form 131-->134. BUN with rise to 32.             --encourage fluid intake and should improve off Lasix.  --hyponatremia resolved off HCTZ. 5/27- BUN still 29- if rises above 30, will give IVFs- due to hx of fluid overload, will not add IVFs before then 10. ABLA/B12 deficiency: Continue to monitor. Has high ferritin level with MCV 104. -daily B12 injections until discharge from rehab -transfuse for hgb <7 5/27- Hb stable at 7.7- will monitor trends  11. Vitamin B 12 deficiency: Vitam in B 12< 50 with recheck after supplementation > 7,500 12. Fluid overload: Daily wts with strict I/O. Monitor for signs of overload.  --has been on IV lasix 20 mg since 05/08 w/recs to change  to to prn. -will change lasix to 20mg  daily prn and monitor weights daily.  --will add low salt diet.  5/27- will add Daily weights- and monitor trend 13. Prostate cancer             -monitor voiding patterns             -flomax             -f/u as outpt with Dr. Retta Diones 14. New onset A fib:             -rate is controlled             -transitioned to eliquis by cardiology, continues on ASA as well  5/27- HR in 60s-60s- sounds like still in some Afib- con't regimen  15. Urinary incontinence  5/27- will stop Purewick for now- put urinal where pt could reach- if has a lot of incontinence due to prostate CA- might need purewick at night- will see trends.    I spent a total of  37  minutes on total care today- >50% coordination of care- due to review of chart, since new to me; as well as IPOC and d/w pt about urinary incontinence- concerned since they tok purewick off.      LOS: 3 days A FACE TO FACE EVALUATION WAS PERFORMED  Iyanni Hepp 09/13/2022, 8:23 AM

## 2022-09-13 NOTE — Progress Notes (Signed)
Physical Therapy Session Note  Patient Details  Name: [name]. MRN: [id number] Date of Birth: Nov 15, 1928  Today's Date: 09/13/2022 PT Individual Time: 6295-2841 PT Individual Time Calculation (min): 70 min   Short Term Goals: Week 1:  PT Short Term Goal 1 (Week 1): Pt will tolerate sitting up between sessions. PT Short Term Goal 2 (Week 1): Pt will perform STS with + 1 assist PT Short Term Goal 3 (Week 1): Pt will ambulate >5 ft with LRAD  Skilled Therapeutic Interventions/Progress Updates:    pt received in w/c and agreeable to therapy. Pt reports chronic hip pain, premedicated. Rest and positioning provided as needed. Pt reports he used urinal, not successful void in urinal but also brief was soiled. Required +2 to stand in stedy for brief change on this date. Bladder void x 2 later in session, once with urinal and once requiring dependent brief change in supine.   Pt propelled w/c with BUE 2 x >150 ft while retrieving slide board to trial. Pt performed the following exercises to promote LE strength and endurance with 4lb ankle weight on LLE: LAQ 2 x 10, 1 x 20 BIL, seated marches 3 x 20 alternating marches.   Pt was transported back to room for time management. Trial of slideboard transfer with max A. Sit>supine with max A for BLE management. Pt then participated in bridging with BLE on bolster for hip strength to improve standing tolerance. Pt was left with all needs in reach and alarm active.    Therapy Documentation Precautions:  Precautions Precautions: Fall Precaution Comments: L heel, R buttocks pressure injuries; HoH Required Braces or Orthoses: Other Brace Other Brace: R post op shoe Restrictions Weight Bearing Restrictions: Yes RLE Weight Bearing: Touchdown weight bearing Other Position/Activity Restrictions: HOH General:       Therapy/Group: Individual Therapy  [name] 09/13/2022, 1:36 PM

## 2022-09-14 MED ORDER — ACETAMINOPHEN 500 MG PO TABS
500.0000 mg | ORAL_TABLET | Freq: Two times a day (BID) | ORAL | Status: DC
  Administered 2022-09-15 – 2022-09-24 (×17): 500 mg via ORAL
  Filled 2022-09-14 (×17): qty 1

## 2022-09-14 NOTE — Progress Notes (Signed)
Physical Therapy Session Note  Patient Details  Name: Jesse BASSANI Sr. MRN: [id number] Date of Birth: 06-19-1928  Today's Date: 09/14/2022 PT Individual Time: 1130-1155 + 1325-1405 PT Individual Time Calculation (min): 25 min + 40 min  Short Term Goals: Week 1:  PT Short Term Goal 1 (Week 1): Pt will tolerate sitting up between sessions. PT Short Term Goal 2 (Week 1): Pt will perform STS with + 1 assist PT Short Term Goal 3 (Week 1): Pt will ambulate >5 ft with LRAD  Skilled Therapeutic Interventions/Progress Updates:     Session 1: Chart reviewed and pt agreeable to therapy. Pt received seated in WC with no c/o pain. Session focused on strengthening to functional transfers to promote independent home mobility and decrease caregiver assistance. Pt initiated session with series of multiple sit to stand practices in setting of TDWB restrictions using MinA + STEDY. Pt noted to not come to full standing with 50% of stands. Pt given VC and education of body positioning to improve energy efficiency. With strong VC + modA + STEDY, pt was able to reach full stand. Pt then completed 2x10 LAQ and 2x10 seated knee marches with min VC. At end of session, pt was left seated in [facility] with alarm engaged, nurse call bell and all needs in reach.  Session 2: Chart reviewed and pt agreeable to therapy. Pt received seated in WC with no c/o pain. Session focused on strengthening for functional transfers to promote pt independence and reduce caregiver assist. Pt initiated session with 2x10 BUE push pull with resistance. Pt then completed 2x5 seated push ups followed by 1x5 seated push ups with 3 sec hold and rest breaks as needed t/o. Pt then completed 5 x sit to stand using MaxA + STEDY to come to partial stand in setting of TDWB restriction. Pt noted to have increased fatigue compared to prior session. Pt then completed 2x10 LAQ and 2x10 seated marches with instructions for repeating exercise between sessions. Pt also  completed another 1x5 seated push ups with 3 sec holds. Session education emphasized continued need for BUE/BLE strength training to promote functional transfers. At end of session, pt was left seated in [facility] with alarm engaged, nurse call bell and all needs in reach.    Therapy Documentation Precautions:  Precautions Precautions: Fall Precaution Comments: L heel, R buttocks pressure injuries; HoH Required Braces or Orthoses: Other Brace Other Brace: R post op shoe Restrictions Weight Bearing Restrictions: Yes RLE Weight Bearing: Touchdown weight bearing Other Position/Activity Restrictions: HOH     Therapy/Group: Individual Therapy  Dionne Milo, PT, DPT 09/14/2022, 12:12 PM

## 2022-09-14 NOTE — Progress Notes (Signed)
PROGRESS NOTE   Subjective/Complaints:  Pt reports that since they removed catheter, has urinary leakage- when feels need to pee, goes immediately- doesn't have time to get urinal in place- mentioned had multiple radiation tx's for Prostate CA- no surgery.   Doesn't have time to call for nursing- already wet.   ROS:  Pt denies SOB, abd pain, CP, N/V/C/D, and vision changes  Except for HPI Objective:   No results found. No results for input(s): "WBC", "HGB", "HCT", "PLT" in the last 72 hours.  Recent Labs    09/13/22 0603  NA 133*  K 4.6  CL 103  CO2 24  GLUCOSE 102*  BUN 29*  CREATININE 1.01  CALCIUM 8.3*    Intake/Output Summary (Last 24 hours) at 09/14/2022 0813 Last data filed at 09/14/2022 0700 Gross per 24 hour  Intake 238 ml  Output 710 ml  Net -472 ml     Pressure Injury 08/25/22 Heel Left Deep Tissue Pressure Injury - Purple or maroon localized area of discolored intact skin or blood-filled blister due to damage of underlying soft tissue from pressure and/or shear. 6.5cm x 5.5cm (Active)  08/25/22 0730  Location: Heel  Location Orientation: Left  Staging: Deep Tissue Pressure Injury - Purple or maroon localized area of discolored intact skin or blood-filled blister due to damage of underlying soft tissue from pressure and/or shear.  Wound Description (Comments): 6.5cm x 5.5cm  Present on Admission: Yes     Pressure Injury 08/25/22 Buttocks Right Stage 2 -  Partial thickness loss of dermis presenting as a shallow open injury with a red, pink wound bed without slough. (Active)  08/25/22   Location: Buttocks  Location Orientation: Right  Staging: Stage 2 -  Partial thickness loss of dermis presenting as a shallow open injury with a red, pink wound bed without slough.  Wound Description (Comments):   Present on Admission: Yes     Pressure Injury 09/10/22 Sacrum Medial Stage 2 -  Partial thickness loss  of dermis presenting as a shallow open injury with a red, pink wound bed without slough. Elongated area over sacrum (Active)  09/10/22 1912  Location: Sacrum  Location Orientation: Medial  Staging: Stage 2 -  Partial thickness loss of dermis presenting as a shallow open injury with a red, pink wound bed without slough.  Wound Description (Comments): Elongated area over sacrum  Present on Admission: Yes    Physical Exam: Vital Signs Blood pressure (!) 145/88, pulse 63, temperature 98.6 F (37 C), temperature source Oral, resp. rate 18, height 5\' 7"  (1.702 m), weight 86.9 kg, SpO2 100 %.     General: awake, alert, appropriate, sitting up in bed; NAD HENT: conjugate gaze; oropharynx moist CV: regular rate and regular rhythm this AM; no JVD Pulmonary: CTA B/L; no W/R/R- good air movement GI: soft, NT, ND, (+)BS- hyperactive slightly Psychiatric: appropriate- flat Neurological: Ox3- very HOH Skin; Has VAC and ACE wrap on R foot- looks stable- VAC working Neurologic: Cranial nerves II through XII intact, motor strength is 5/5 in bilateral deltoid, bicep, tricep, grip, 3 - bilateral hip flexor, 4/5 knee extensors, ankle dorsiflexor and plantar flexor Sensory exam normal sensation to light  touchin bilateral lower extremities  Musculoskeletal: Full range of motion in all 4 extremities. No joint swelling   Assessment/Plan: 1. Functional deficits which require 3+ hours per day of interdisciplinary therapy in a comprehensive inpatient rehab setting. Physiatrist is providing close team supervision and 24 hour management of active medical problems listed below. Physiatrist and rehab team continue to assess barriers to discharge/monitor patient progress toward functional and medical goals  Care Tool:  Bathing    Body parts bathed by patient: Right arm, Left arm, Chest, Abdomen, Right upper leg, Left upper leg, Face   Body parts bathed by helper: Right lower leg, Left lower leg, Buttocks,  Front perineal area     Bathing assist Assist Level: 2 Helpers     Upper Body Dressing/Undressing Upper body dressing   What is the patient wearing?: Pull over shirt    Upper body assist Assist Level: Moderate Assistance - Patient 50 - 74%    Lower Body Dressing/Undressing Lower body dressing      What is the patient wearing?: Pants, Incontinence brief     Lower body assist Assist for lower body dressing: 2 Helpers     Toileting Toileting    Toileting assist Assist for toileting: 2 Helpers     Transfers Chair/bed transfer  Transfers assist     Chair/bed transfer assist level: 2 Helpers     Locomotion Ambulation   Ambulation assist   Ambulation activity did not occur: Safety/medical concerns          Walk 10 feet activity   Assist  Walk 10 feet activity did not occur: Safety/medical concerns        Walk 50 feet activity   Assist Walk 50 feet with 2 turns activity did not occur: Safety/medical concerns         Walk 150 feet activity   Assist Walk 150 feet activity did not occur: Safety/medical concerns         Walk 10 feet on uneven surface  activity   Assist Walk 10 feet on uneven surfaces activity did not occur: Safety/medical concerns         Wheelchair     Assist Is the patient using a wheelchair?: No (Pt left in w/c but did not attempt propulsion or dependent transport.)             Wheelchair 50 feet with 2 turns activity    Assist            Wheelchair 150 feet activity     Assist          Blood pressure (!) 145/88, pulse 63, temperature 98.6 F (37 C), temperature source Oral, resp. rate 18, height 5\' 7"  (1.702 m), weight 86.9 kg, SpO2 100 %.  Medical Problem List and Plan: 1. Functional deficits secondary to debility related to sepsis from Right 5th MT osteomyelitis and other medical complications. Pt is s/p right 5th ray amputation 5/22.             -TDWB RLE             -patient  may not yet shower due to [facility]             -ELOS/Goals: 21-24 days, supervision to min assist goals with PT, OT  Con't CIR PT and OT  Team conference today to determine length of stay 2.  Antithrombotics: -DVT/anticoagulation:  Pharmaceutical: Eliquis             -antiplatelet therapy: ASA 81mg  daily  3. Pain Management:  Oxycodone prn. 5/27- has very little pain- mainly stinging- con't mainly tylenol prn  4. Mood/Behavior/Sleep: LCSW to follow for evaluation and support.              -antipsychotic agents: N/A             -Melatonin prn for insomnia.  5. Neuropsych/cognition: This patient is capable of making decisions on his own behalf. 6. Skin/Wound Care: Continue wound VAC for now.              -pressure relief and foam dressings to left heel, buttocks wounds. Medihoney with gauze   under foam dressing for left heel..  5/27- will need to check when [facility] can be removed on R foot. - Dr Lajoyce Corners is surgeon  5/28- will d/w Rinaldo Cloud to see if can check when can be removed- surgery was 5/22- so been 6 days? 7. Fluids/Electrolytes/Nutrition: Strict I/O w/daily weights             --Add  boost to help w/protein stores and promote healing.              --On zinc and Vitamin C 8. Bacteremia with osteomyelitis: Completed IV antibiotics and s/p Ray amputation             --Mop tx with Augmentin/doxycycline through June 5 9. Hyponatremia/pre-renal azotemia: Na improving form 131-->134. BUN with rise to 32.             --encourage fluid intake and should improve off Lasix.  --hyponatremia resolved off HCTZ. 5/27- BUN still 29- if rises above 30, will give IVFs- due to hx of fluid overload, will not add IVFs before then 5/28- will recheck Thursday 10. ABLA/B12 deficiency: Continue to monitor. Has high ferritin level with MCV 104. -daily B12 injections until discharge from rehab -transfuse for hgb <7 5/27- Hb stable at 7.7- will monitor trends  11. Vitamin B 12 deficiency: Vitam in B 12< 50 with recheck  after supplementation > 7,500 12. Fluid overload: Daily wts with strict I/O. Monitor for signs of overload.  --has been on IV lasix 20 mg since 05/08 w/recs to change to to prn. -will change lasix to 20mg  daily prn and monitor weights daily.  --will add low salt diet.  5/27- will add Daily weights- and monitor trend 5/28- no weight in computer- - will d/w nursing.  13. Prostate cancer             -monitor voiding patterns             -flomax             -f/u as outpt with Dr. Retta Diones  5/28- s/p radiation for prostate CA- likely cause for Incontinence 14. New onset A fib:             -rate is controlled             -transitioned to eliquis by cardiology, continues on ASA as well  5/27- HR in 60s-60s- sounds like still in some Afib- con't regimen  15. Urinary incontinence  5/27- will stop Purewick for now- put urinal where pt could reach- if has a lot of incontinence due to prostate CA- might need purewick at night- will see trends. 5/28- having urinary incontinence per pt- had multiple rounds of radiation for prostate CA- likely cause for urinary incontinence-  will see if can do purewick at night- and d/w team    I spent a total of 50  minutes on total care today- >50% coordination of care- due to team conference to determine length of stay- also d/w team about urinary issues- also d/w nursing about daily weights. Also d/w PA about when [facility] can be removed  LOS: 4 days A FACE TO FACE EVALUATION WAS PERFORMED  [name] 09/14/2022, 8:13 AM

## 2022-09-14 NOTE — Progress Notes (Signed)
Inpatient Rehabilitation  Patient information reviewed and entered into eRehab system by Ellery Tash M. Everhett Bozard, M.A., CCC/SLP, PPS Coordinator.  Information including medical coding, functional ability and quality indicators will be reviewed and updated through discharge.    

## 2022-09-14 NOTE — Progress Notes (Signed)
Occupational Therapy Session Note  Patient Details  Name: Jesse CLANIN Sr. MRN: [id number] Date of Birth: Nov 22, 1928  Today's Date: 09/14/2022 OT Individual Time: 7829-5621 OT Individual Time Calculation (min): 75 min    Short Term Goals: Week 1:  OT Short Term Goal 1 (Week 1): Patient will flex forward to reach to feet while seated in wheelchair with min assist OT Short Term Goal 2 (Week 1): Patient will transition from an elevated surface sitting to partial standing (as needed for dressing) with max assist OT Short Term Goal 3 (Week 1): In preparation for toileting, patient will complete squat pivot transfer with max assist OT Short Term Goal 4 (Week 1): Patient will don/doff upper body clothing with set up assistance and increased time.  Skilled Therapeutic Interventions/Progress Updates:    Pt resting in bed upon arrival. OT intervention with focus on bed mobility, sitting balance EOB, sit<>stand in [place], BADLs w/c level at sink, and activity tolerance to increase independence with BADLs. Supine>sit EOB max A. Pt SOB after bed mobility and required rest break before continuing. Pt able to scoot to EOB with min A. Sitting balance EOB with close supervision. Sit>stand from EOB (elevated) with Stedy mod A. Pt completed UB bathing/dressing w/c level at sink. Mod A for donning pullover shirt. Dependent for LB dressing with sit<>stand. Pt voided x 3 during session. Pt requires multiple rest breaks during session. Pt remained in w/c with all needs within reach and belt alarm activated.   Therapy Documentation Precautions:  Precautions Precautions: Fall Precaution Comments: L heel, R buttocks pressure injuries; HoH Required Braces or Orthoses: Other Brace Other Brace: R post op shoe Restrictions Weight Bearing Restrictions: Yes RLE Weight Bearing: Touchdown weight bearing Other Position/Activity Restrictions: HOH  Pain:  Pt denies pain this morning   Therapy/Group: Individual  Therapy  [name] 09/14/2022, 11:46 AM

## 2022-09-14 NOTE — Progress Notes (Signed)
Patient ID: Jesse Pollock Sr., male   DOB: Jan 01, 1929, 89 y.o.   MRN: [id number]  1509- SW spoke with pt dtr [name] to provide updates from team conference on continued physical support that will be required in which he will be within Min Asst-Mod Asst and recommendation of SNF given that she will have to arranged for someone to come to the home to help him with transfers to the bed, w/c, bathroom, etc. She intends to think about the best plan of care. SW will follow-up on Thursday to touch base.    Cecile Sheerer, MSW, LCSWA Office: [phone] Cell: [phone] Fax: [phone]

## 2022-09-14 NOTE — Progress Notes (Signed)
Physical Therapy Session Note  Patient Details  Name: Jesse DEPASQUALE Sr. MRN: [id number] Date of Birth: 1928/10/12  Today's Date: 09/14/2022 PT Individual Time: 1005-1100 PT Individual Time Calculation (min): 55 min   Short Term Goals: Week 1:  PT Short Term Goal 1 (Week 1): Pt will tolerate sitting up between sessions. PT Short Term Goal 2 (Week 1): Pt will perform STS with + 1 assist PT Short Term Goal 3 (Week 1): Pt will ambulate >5 ft with LRAD  Skilled Therapeutic Interventions/Progress Updates:    Pt seated in w/c on arrival and agreeable to therapy. Pt reports chronic pain, premedicated. Rest and positioning provided as needed. Pt request to attempt to use urinal, no void. Later in session, pt used urinal with assist,   Pt propelled w/c with BUE >150 ft during session for endurance and functional mobility.  Pt then participated in beach ball volleys with 2 lb dowels for activity tolerance x 3 bouts. Pt then performed alternating marches with bicep curl with 5 lb bar.   Pt returned to room and was left with all needs in reach and alarm active.    Therapy Documentation Precautions:  Precautions Precautions: Fall Precaution Comments: L heel, R buttocks pressure injuries; HoH Required Braces or Orthoses: Other Brace Other Brace: R post op shoe Restrictions Weight Bearing Restrictions: Yes RLE Weight Bearing: Touchdown weight bearing Other Position/Activity Restrictions: HOH General:   Vital Signs:  Pain:   Mobility:   Locomotion :    Trunk/Postural Assessment :    Balance:   Exercises:   Other Treatments:      Therapy/Group: Individual Therapy  [name] 09/14/2022, 10:30 AM

## 2022-09-15 ENCOUNTER — Encounter (HOSPITAL_COMMUNITY): Payer: Self-pay | Admitting: Vascular Surgery

## 2022-09-15 MED ORDER — SORBITOL 70 % SOLN
30.0000 mL | Freq: Once | Status: AC
  Administered 2022-09-15: 30 mL via ORAL
  Filled 2022-09-15: qty 30

## 2022-09-15 NOTE — Progress Notes (Signed)
Physical Therapy Session Note  Patient Details  Name: Jesse SCIULLO Sr. MRN: [id number] Date of Birth: 1928/07/11  Today's Date: 09/15/2022 PT Individual Time: 1020-1115 and 1420-1538  PT Individual Time Calculation (min): 55 min and 78 min  Short Term Goals: Week 1:  PT Short Term Goal 1 (Week 1): Pt will tolerate sitting up between sessions. PT Short Term Goal 2 (Week 1): Pt will perform STS with + 1 assist PT Short Term Goal 3 (Week 1): Pt will ambulate >5 ft with LRAD  Skilled Therapeutic Interventions/Progress Updates: Tx1: Pt presented in w/c agreeable to therapy. Pt denies pain at start of session. Pt indicated need to void, PTA provided urinal and assist for brief management (continent urinary void). Once completed participated in w/c propulsion to main gym for general conditioning with supervision and requiring x 1 break due to fatigue. In parallel bars pt participated in Sit to stand x 5 with PTA facilitating anterior weight shifting. Pt required increased time for recovery between stands. Pt then transported to day room and pt participated in Cybex Kinetron 70cm/sec x 3 min with pt appearing to tolerate well. Pt then propelled back to room with supervision for general conditioning with pt's family present in room upon entry. Pt agreeable to remain in w/c at end of session with belt alarm on, call bell within reach and needs met.   Tx2: Pt presented in bed sleeping but easily aroused and agreeable to therapy. Pt denies pain at rest. Pt noted to now have condom cath. Pt performed bed mobility with supervision and use of bed features. Pt was able to scoot to EOB with CGA, Darco shoe noted to be one. Pt set up with Stedy and performed Stedy transfer with minA to power up and transferred to w/c. Pt transported to main gym for energy conservation and performed Stedy transfer to high/low mat. Mat was then elevated and pt performed Sit to stand from elevated mat x 3 but requiring extended rest  breaks between. Pt able to stand with minA and mod multimodal cues for anterior weight shifting. Pt was able to maintain standing for ~10sec with noted increased tremors. Pt then returned to w/c in same manner as prior and transported to [facility] entrance patio for environmental change with pt noted to have brighter affect. Pt expressed his concern regarding ability to improve before d/c and family concerns regarding care after d/c. Provided therapeutic listening and encouragement that pt will continue to improve prior to d/c. Pt also participated in seated LE therex including LAQ with 2.5lb weight and seated hip flexion with 2.5lb weight 2 x 10. Pt also performed hip er with red theraband 2 x 10. Pt transported back to unit and performed Stedy transfer back to bed with pt requiring minA to pull self up to standing. At EOB pt required modA for sit to supine primarily for BLE management. Pt required dependent x 2 to boost to [facility] and was repositioned to comfort. Pt left in bed at end of session with bed alarm on, call bell within reach ans x 4 rails up per pt request.      Therapy Documentation Precautions:  Precautions Precautions: Fall Precaution Comments: L heel, R buttocks pressure injuries; HoH Required Braces or Orthoses: Other Brace Other Brace: R post op shoe Restrictions Weight Bearing Restrictions: Yes RLE Weight Bearing: Touchdown weight bearing Other Position/Activity Restrictions: HOH General:   Vital Signs: Therapy Vitals Temp: 98.1 F (36.7 C) Temp Source: Oral Pulse Rate: (!) 59 Resp:  18 BP: (!) 129/53 Patient Position (if appropriate): Lying Oxygen Therapy SpO2: 100 % O2 Device: Room Air Pain:   Mobility:   Locomotion :    Trunk/Postural Assessment :    Balance:   Exercises:   Other Treatments:      Therapy/Group: Individual Therapy  Cristiana Yochim 09/15/2022, 4:27 PM

## 2022-09-15 NOTE — Patient Care Conference (Signed)
Inpatient RehabilitationTeam Conference and Plan of Care Update Date: 09/15/2022   Time: 11:17    Patient Name: Jesse WENINGER Sr.      Medical Record Number: [id number]  Date of Birth: [birth year] Sex: Male         Room/Bed: 4W24C/4W24C-01 Payor Info: Payor: MEDICARE / Plan: MEDICARE PART A AND B / Product Type: *No Product type* /    Admit Date/Time:  09/10/2022  5:44 PM  Primary Diagnosis:  Debility  Hospital Problems: Principal Problem:   Debility    Expected Discharge Date: Expected Discharge Date:  (TBD)  Team Members Present: Physician leading conference: Dr. Genice Rouge Social Worker Present: Cecile Sheerer, LCSWA Nurse Present: Vedia Pereyra, RN PT Present: Bernie Covey, PT OT Present: Roney Mans, OT;Ardis Rowan, COTA PPS Coordinator present : Fae Pippin, SLP     Current Status/Progress Goal Weekly Team Focus  Bowel/Bladder   incontinent of b/b; LBM: 5/26   offer toileting when awake   assist with toileting needs prn    Swallow/Nutrition/ Hydration               ADL's   bathing-max A; UB dressing-mod A; LB dressing-dependent; sit<>stand with Stedy-max A; toileting-tot A;   mod A overall   BADLs, functional transfers, activity tolerance, education    Mobility   max for bed bed mobility, up to mod for STS, but as much as tot +2, SBT with max A   min A transfers and short distance gait  endurance, transfers    Communication                Safety/Cognition/ Behavioral Observations               Pain   no c/o pain   remain pain free   assess pain QS and prn    Skin   continous WV to R foot; blister to L foot (medihoney drsg)   continue dressing changes per MD order; remain free of new skin breakdown/infection  assess skin QS and prn      Discharge Planning:  Discharge plan undecided at this time. SW discussed in length the support pt may continue to require at discharge since she reports she is not physically able to lift  him and has been calling son and son's father to come to the home and assist. Will schedule family edu closer towards discharge to allow her to make a more informed decision. SW will confirm there are no barriers to discharge.   Team Discussion: Debility. Incontinent of urine due to radiation from prostate CA. Okay to use condom catheter. Daughter unsure that can care for him. Very stiff when moving is biggest barrier. Did better with SB with therapy. Goals not appropriate at this time. DTI left heel. Stage II to right buttocks, right foot 5th ray amputation with wound vac. TDWB. New Afib.  Patient on target to meet rehab goals: no, goals not appropriate. Goals may need to be adjusted.   *See Care Plan and progress notes for long and short-term goals.   Revisions to Treatment Plan:  Condom catheter. Monitor labs/VS. Wound vac removal.   Teaching Needs: Medications, diet modifications, safety, self care, skin/wound care, gait/transfer training, etc.   Current Barriers to Discharge: Decreased caregiver support, Incontinence, Wound care, and Weight bearing restrictions  Possible Resolutions to Barriers: Family education, condom catheter for incontinence, skin/wound education, order recommended DME if needed.      Medical Summary Current Status: DTI L heel; R  foot incision; B/L buttocks and  stage II sacrum- TDWB RLE- VAC for 2 days- very HOH-  Barriers to Discharge: Self-care education;Other (comments);Complicated Wound;Incontinence;Uncontrolled Pain;Weight bearing restrictions  Barriers to Discharge Comments: daughter cannot lift him- she is trying to decide if go to SNF- or not- max- A to total- goals mod A right now- very HOH- also limited by VAC and stiff/pain Possible Resolutions to [employer]: will schedule tylenol breakfast and lunch- looking at SNF, since daughter cannot take him home at current function/planned level of function   Continued Need for Acute Rehabilitation  Level of Care: The patient requires daily medical management by a physician with specialized training in physical medicine and rehabilitation for the following reasons: Direction of a multidisciplinary physical rehabilitation program to maximize functional independence : Yes Medical management of patient stability for increased activity during participation in an intensive rehabilitation regime.: Yes Analysis of laboratory values and/or radiology reports with any subsequent need for medication adjustment and/or medical intervention. : Yes   I attest that I was present, lead the team conference, and concur with the assessment and plan of the team.   Jearld Adjutant 09/15/2022, 7:44 AM

## 2022-09-15 NOTE — Progress Notes (Signed)
PROGRESS NOTE   Subjective/Complaints:  Pt asked if can continue to use urinal to try and be in control of urinary issues.  Slept OK, but had to wake to void multiple times.  No BM in 3 days  VAC to be removed tomorrow.   ROS:  Pt denies SOB, abd pain, CP, N/V/ (+)C/D, and vision changes   Except for HPI Objective:   No results found. No results for input(s): "WBC", "HGB", "HCT", "PLT" in the last 72 hours.  Recent Labs    09/13/22 0603  NA 133*  K 4.6  CL 103  CO2 24  GLUCOSE 102*  BUN 29*  CREATININE 1.01  CALCIUM 8.3*    Intake/Output Summary (Last 24 hours) at 09/15/2022 0811 Last data filed at 09/15/2022 0754 Gross per 24 hour  Intake 597 ml  Output 625 ml  Net -28 ml     Pressure Injury 08/25/22 Heel Left Deep Tissue Pressure Injury - Purple or maroon localized area of discolored intact skin or blood-filled blister due to damage of underlying soft tissue from pressure and/or shear. 6.5cm x 5.5cm (Active)  08/25/22 0730  Location: Heel  Location Orientation: Left  Staging: Deep Tissue Pressure Injury - Purple or maroon localized area of discolored intact skin or blood-filled blister due to damage of underlying soft tissue from pressure and/or shear.  Wound Description (Comments): 6.5cm x 5.5cm  Present on Admission: Yes     Pressure Injury 08/25/22 Buttocks Right Stage 2 -  Partial thickness loss of dermis presenting as a shallow open injury with a red, pink wound bed without slough. (Active)  08/25/22   Location: Buttocks  Location Orientation: Right  Staging: Stage 2 -  Partial thickness loss of dermis presenting as a shallow open injury with a red, pink wound bed without slough.  Wound Description (Comments):   Present on Admission: Yes     Pressure Injury 09/10/22 Sacrum Medial Stage 2 -  Partial thickness loss of dermis presenting as a shallow open injury with a red, pink wound bed without  slough. Elongated area over sacrum (Active)  09/10/22 1912  Location: Sacrum  Location Orientation: Medial  Staging: Stage 2 -  Partial thickness loss of dermis presenting as a shallow open injury with a red, pink wound bed without slough.  Wound Description (Comments): Elongated area over sacrum  Present on Admission: Yes    Physical Exam: Vital Signs Blood pressure (!) 126/57, pulse (!) 55, temperature 98 F (36.7 C), resp. rate 16, height 5\' 7"  (1.702 m), weight 86.9 kg, SpO2 100 %.      General: awake, alert, appropriate, sitting up in bed; staring when entered room;  NAD HENT: conjugate gaze; oropharynx moist CV: regular to borderline bradycardic- ; no JVD Pulmonary: CTA B/L; no W/R/R- good air movement GI: soft, NT, slightly distended; hypoactive BS Psychiatric: appropriate- a little flat Neurological: very HOH- alert Skin- VAC on R foot- working-  L heel DTI- covered with foam dressing Neurologic: Cranial nerves II through XII intact, motor strength is 5/5 in bilateral deltoid, bicep, tricep, grip, 3 - bilateral hip flexor, 4/5 knee extensors, ankle dorsiflexor and plantar flexor Sensory exam normal sensation to  light touchin bilateral lower extremities  Musculoskeletal: Full range of motion in all 4 extremities. No joint swelling   Assessment/Plan: 1. Functional deficits which require 3+ hours per day of interdisciplinary therapy in a comprehensive inpatient rehab setting. Physiatrist is providing close team supervision and 24 hour management of active medical problems listed below. Physiatrist and rehab team continue to assess barriers to discharge/monitor patient progress toward functional and medical goals  Care Tool:  Bathing    Body parts bathed by patient: Right arm, Left arm, Chest, Abdomen, Front perineal area, Right upper leg, Left upper leg, Face   Body parts bathed by helper: Buttocks, Left lower leg, Right lower leg     Bathing assist Assist Level:  Maximal Assistance - Patient 24 - 49%     Upper Body Dressing/Undressing Upper body dressing   What is the patient wearing?: Pull over shirt    Upper body assist Assist Level: Moderate Assistance - Patient 50 - 74%    Lower Body Dressing/Undressing Lower body dressing      What is the patient wearing?: Pants, Incontinence brief     Lower body assist Assist for lower body dressing: Dependent - Patient 0%     Toileting Toileting    Toileting assist Assist for toileting: 2 Helpers     Transfers Chair/bed transfer  Transfers assist     Chair/bed transfer assist level: 2 Helpers     Locomotion Ambulation   Ambulation assist   Ambulation activity did not occur: Safety/medical concerns          Walk 10 feet activity   Assist  Walk 10 feet activity did not occur: Safety/medical concerns        Walk 50 feet activity   Assist Walk 50 feet with 2 turns activity did not occur: Safety/medical concerns         Walk 150 feet activity   Assist Walk 150 feet activity did not occur: Safety/medical concerns         Walk 10 feet on uneven surface  activity   Assist Walk 10 feet on uneven surfaces activity did not occur: Safety/medical concerns         Wheelchair     Assist Is the patient using a wheelchair?: Yes (Pt left in w/c but did not attempt propulsion or dependent transport.) Type of Wheelchair: Manual           Wheelchair 50 feet with 2 turns activity    Assist    Wheelchair 50 feet with 2 turns activity did not occur: Safety/medical concerns       Wheelchair 150 feet activity     Assist  Wheelchair 150 feet activity did not occur: Safety/medical concerns       Blood pressure (!) 126/57, pulse (!) 55, temperature 98 F (36.7 C), resp. rate 16, height 5\' 7"  (1.702 m), weight 86.9 kg, SpO2 100 %.  Medical Problem List and Plan: 1. Functional deficits secondary to debility related to sepsis from Right 5th MT  osteomyelitis and other medical complications. Pt is s/p right 5th ray amputation 5/22.             -TDWB RLE             -patient may not yet shower due to [facility]             -ELOS/Goals: 21-24 days,   Con't CIR PT and OT Pt's goals mod A- daughter cannot provide this- looking at possible SNF 2.  Antithrombotics: -  DVT/anticoagulation:  Pharmaceutical: Eliquis             -antiplatelet therapy: ASA 81mg  daily 3. Pain Management:  Oxycodone prn. 5/27- has very little pain- mainly stinging- con't mainly tylenol prn  4. Mood/Behavior/Sleep: LCSW to follow for evaluation and support.              -antipsychotic agents: N/A             -Melatonin prn for insomnia.  5. Neuropsych/cognition: This patient is capable of making decisions on his own behalf. 6. Skin/Wound Care: Continue wound VAC for now.              -pressure relief and foam dressings to left heel, buttocks wounds. Medihoney with gauze   under foam dressing for left heel..  5/27- will need to check when [facility] can be removed on R foot. - Dr Lajoyce Corners is surgeon  5/28- will d/w Rinaldo Cloud to see if can check when can be removed- surgery was 5/22- so been 6 days?  5/29- can get removed 5/29-  7. Fluids/Electrolytes/Nutrition: Strict I/O w/daily weights             --Add  boost to help w/protein stores and promote healing.              --On zinc and Vitamin C 8. Bacteremia with osteomyelitis: Completed IV antibiotics and s/p Ray amputation             --Mop tx with Augmentin/doxycycline through June 5 9. Hyponatremia/pre-renal azotemia: Na improving form 131-->134. BUN with rise to 32.             --encourage fluid intake and should improve off Lasix.  --hyponatremia resolved off HCTZ. 5/27- BUN still 29- if rises above 30, will give IVFs- due to hx of fluid overload, will not add IVFs before then 5/28- will recheck Thursday 10. ABLA/B12 deficiency: Continue to monitor. Has high ferritin level with MCV 104. -daily B12 injections until discharge  from rehab -transfuse for hgb <7 5/27- Hb stable at 7.7- will monitor trends  11. Vitamin B 12 deficiency: Vitam in B 12< 50 with recheck after supplementation > 7,500 12. Fluid overload: Daily wts with strict I/O. Monitor for signs of overload.  --has been on IV lasix 20 mg since 05/08 w/recs to change to to prn. -will change lasix to 20mg  daily prn and monitor weights daily.  --will add low salt diet.  5/27- will add Daily weights- and monitor trend 5/29- d/w nursing about getting pt weighed again.  13. Prostate cancer             -monitor voiding patterns             -flomax             -f/u as outpt with Dr. Retta Diones  5/28- s/p radiation for prostate CA- likely cause for Incontinence 14. New onset A fib:             -rate is controlled             -transitioned to eliquis by cardiology, continues on ASA as well  5/27- HR in 50s-60s- sounds like still in some Afib- con't regimen  5/29- in/out of afib- rate controlled- con't regimen  15. Urinary incontinence  5/27- will stop Purewick for now- put urinal where pt could reach- if has a lot of incontinence due to prostate CA- might need purewick at night- will see trends. 5/28- having urinary incontinence  per pt- had multiple rounds of radiation for prostate CA- likely cause for urinary incontinence-  will see if can do purewick at night- and d/w team   5/29- d/w nursing- can do condom cath, however pt really wants to try urinal- so will not place condom cath at this time.  16. Constipation  5/29- will give sorbitol 30cc and if doesn't work, will give soap suds enema    I spent a total of 37   minutes on total care today- >50% coordination of care- due to d/w nursing about weights as well as prolonged time with pt discussing urinary issues.    LOS: 5 days A FACE TO FACE EVALUATION WAS PERFORMED  Damaya Channing 09/15/2022, 8:11 AM

## 2022-09-15 NOTE — Progress Notes (Signed)
Occupational Therapy Session Note  Patient Details  Name: Jesse DEBERARDINIS Sr. MRN: [id number] Date of Birth: 1928-07-08  Today's Date: 09/15/2022 OT Individual Time: 4098-1191 OT Individual Time Calculation (min): 70 min    Short Term Goals: Week 1:  OT Short Term Goal 1 (Week 1): Patient will flex forward to reach to feet while seated in wheelchair with min assist OT Short Term Goal 2 (Week 1): Patient will transition from an elevated surface sitting to partial standing (as needed for dressing) with max assist OT Short Term Goal 3 (Week 1): In preparation for toileting, patient will complete squat pivot transfer with max assist OT Short Term Goal 4 (Week 1): Patient will don/doff upper body clothing with set up assistance and increased time.  Skilled Therapeutic Interventions/Progress Updates:    Pt resting in bed upon arrival, using urinal. Pt voided again 20 mins later-total 100 ml. LPN present to administer meds. Pt requested to use bed pain stating he "felt like" he was going to have a BM. Rolling in bed with max A for positioning of bed pan. Foam Mepilex removed from sacrum. LPN reapplied when off bed pan. Pt did not have BM. Pt with some difficulty taking medications this morning and required rest break. OT intervention with focus on UB bathing, bed mobility, sit<>stand in [place], and activity tolerance to increase independence with BADLs. Supine>sit EOB with HOB elevated. Min A to manage BLE and max A for supine>sit EOB with pt pulling on bed rail. Pt able to scoot to EOB with min A. Sitting balance EOB with close supervision. Sit>stand in [place] with mod A. Min A when standing from paddles. Stand>sit with mod A for control of descent. Pt remained in w/c with all needs within reach. Belt alarm activated.   Therapy Documentation Precautions:  Precautions Precautions: Fall Precaution Comments: L heel, R buttocks pressure injuries; HoH Required Braces or Orthoses: Other Brace Other Brace: R  post op shoe Restrictions Weight Bearing Restrictions: Yes RLE Weight Bearing: Touchdown weight bearing Other Position/Activity Restrictions: HOH  Pain:  Pt denies pain   Therapy/Group: Individual Therapy  [name] 09/15/2022, 9:27 AM

## 2022-09-15 NOTE — Progress Notes (Signed)
Recreational Therapy Session Note  Patient Details  Name: Jesse ANDRADE Sr. MRN: [id number] Date of Birth: [birth year] Today's Date: 09/15/2022  Pain:  no c/o Pt participated in animal assisted activity seated w/c level with supervision.  Pt appreciative of this visit.    Nicky Milhouse 09/15/2022, 4:40 PM

## 2022-09-15 NOTE — Progress Notes (Signed)
Patient ID: Jesse Pollock Sr., male   DOB: [birth year], 89 y.o.   MRN: [id number]  SW met with pt son and DIL at request of PT as they asked SW to stop by. SW provided updates from team conference, and conversation with pt sister yesterday. SW reiterated that pt will require Min A-Mod A once discharged and his sister reports not being able to provide physical support. Encouraged him to speak with his sister. States he is trying to wrap his mind around his father's current condition as she is with him almost daily during the day. SW discussed recommendation for SNF rehab if he continues to require physical support and no additional supports. He is aware SW will follow-up with his sister tomorrow to discuss if there are any updates.   Cecile Sheerer, MSW, LCSWA Office: [phone] Cell: [phone] Fax: [phone]

## 2022-09-16 ENCOUNTER — Telehealth: Payer: Self-pay

## 2022-09-16 ENCOUNTER — Inpatient Hospital Stay (HOSPITAL_COMMUNITY): Payer: Medicare Other

## 2022-09-16 DIAGNOSIS — K5900 Constipation, unspecified: Secondary | ICD-10-CM

## 2022-09-16 DIAGNOSIS — R7881 Bacteremia: Secondary | ICD-10-CM

## 2022-09-16 DIAGNOSIS — Z7901 Long term (current) use of anticoagulants: Secondary | ICD-10-CM

## 2022-09-16 DIAGNOSIS — D649 Anemia, unspecified: Secondary | ICD-10-CM

## 2022-09-16 LAB — CBC WITH DIFFERENTIAL/PLATELET
Abs Immature Granulocytes: 0.03 10*3/uL (ref 0.00–0.07)
Basophils Absolute: 0.1 10*3/uL (ref 0.0–0.1)
Basophils Relative: 1 %
Eosinophils Absolute: 0.3 10*3/uL (ref 0.0–0.5)
Eosinophils Relative: 4 %
HCT: 22.1 % — ABNORMAL LOW (ref 39.0–52.0)
Hemoglobin: 7.1 g/dL — ABNORMAL LOW (ref 13.0–17.0)
Immature Granulocytes: 0 %
Lymphocytes Relative: 41 %
Lymphs Abs: 2.9 10*3/uL (ref 0.7–4.0)
MCH: 34 pg (ref 26.0–34.0)
MCHC: 32.1 g/dL (ref 30.0–36.0)
MCV: 105.7 fL — ABNORMAL HIGH (ref 80.0–100.0)
Monocytes Absolute: 0.8 10*3/uL (ref 0.1–1.0)
Monocytes Relative: 11 %
Neutro Abs: 3 10*3/uL (ref 1.7–7.7)
Neutrophils Relative %: 43 %
Platelets: 345 10*3/uL (ref 150–400)
RBC: 2.09 MIL/uL — ABNORMAL LOW (ref 4.22–5.81)
RDW: 17.6 % — ABNORMAL HIGH (ref 11.5–15.5)
WBC: 7.1 10*3/uL (ref 4.0–10.5)
nRBC: 0 % (ref 0.0–0.2)

## 2022-09-16 LAB — COMPREHENSIVE METABOLIC PANEL
ALT: 37 U/L (ref 0–44)
AST: 39 U/L (ref 15–41)
Albumin: 2.5 g/dL — ABNORMAL LOW (ref 3.5–5.0)
Alkaline Phosphatase: 54 U/L (ref 38–126)
Anion gap: 7 (ref 5–15)
BUN: 38 mg/dL — ABNORMAL HIGH (ref 8–23)
CO2: 24 mmol/L (ref 22–32)
Calcium: 8.3 mg/dL — ABNORMAL LOW (ref 8.9–10.3)
Chloride: 100 mmol/L (ref 98–111)
Creatinine, Ser: 1.07 mg/dL (ref 0.61–1.24)
GFR, Estimated: >60 mL/min (ref >60)
Glucose, Bld: 91 mg/dL (ref 70–99)
Potassium: 4.8 mmol/L (ref 3.5–5.1)
Sodium: 131 mmol/L — ABNORMAL LOW (ref 135–145)
Total Bilirubin: 0.7 mg/dL (ref 0.3–1.2)
Total Protein: 5 g/dL — ABNORMAL LOW (ref 6.5–8.1)

## 2022-09-16 LAB — BPAM RBC

## 2022-09-16 LAB — PREPARE RBC (CROSSMATCH)

## 2022-09-16 LAB — TYPE AND SCREEN
ABO/RH(D): AB POS
Antibody Screen: NEGATIVE
Donor AG Type: NEGATIVE

## 2022-09-16 LAB — OCCULT BLOOD X 1 CARD TO LAB, STOOL: Fecal Occult Bld: NEGATIVE

## 2022-09-16 MED ORDER — BOOST PLUS PO LIQD
237.0000 mL | Freq: Two times a day (BID) | ORAL | Status: DC
  Administered 2022-09-16 – 2022-09-23 (×9): 237 mL via ORAL
  Filled 2022-09-16 (×15): qty 237

## 2022-09-16 MED ORDER — FUROSEMIDE 40 MG PO TABS: 40.0000 mg | ORAL_TABLET | Freq: Once | ORAL | Status: DC

## 2022-09-16 MED ORDER — FUROSEMIDE 10 MG/ML IJ SOLN
20.0000 mg | Freq: Once | INTRAMUSCULAR | Status: AC
  Administered 2022-09-16: 20 mg via INTRAVENOUS
  Filled 2022-09-16: qty 2

## 2022-09-16 MED ORDER — ACETAMINOPHEN 325 MG PO TABS: 650.0000 mg | ORAL_TABLET | Freq: Once | ORAL | Status: DC

## 2022-09-16 MED ORDER — BISACODYL 10 MG RE SUPP
10.0000 mg | Freq: Every day | RECTAL | Status: DC
  Administered 2022-09-16: 10 mg via RECTAL
  Filled 2022-09-16: qty 1

## 2022-09-16 MED ORDER — PANTOPRAZOLE SODIUM 40 MG PO TBEC
40.0000 mg | DELAYED_RELEASE_TABLET | Freq: Two times a day (BID) | ORAL | Status: DC
  Administered 2022-09-16 – 2022-09-24 (×16): 40 mg via ORAL
  Filled 2022-09-16 (×16): qty 1

## 2022-09-16 MED ORDER — SODIUM CHLORIDE 0.9% IV SOLUTION: Freq: Once | INTRAVENOUS | Status: AC

## 2022-09-16 MED ORDER — DIPHENHYDRAMINE HCL 25 MG PO CAPS
25.0000 mg | ORAL_CAPSULE | Freq: Once | ORAL | Status: AC
  Administered 2022-09-16: 25 mg via ORAL
  Filled 2022-09-16: qty 1

## 2022-09-16 MED ORDER — FUROSEMIDE 10 MG/ML IJ SOLN
20.0000 mg | Freq: Once | INTRAMUSCULAR | Status: AC
  Administered 2022-09-16: 20 mg via INTRAVENOUS

## 2022-09-16 NOTE — Discharge Summary (Signed)
Physician Discharge Summary  Patient ID: Jeffie Pollock Sr. MRN: [id number] DOB/AGE: May 01, 1928 89 y.o.  Admit date: 09/10/2022 Discharge date: 09/24/2022  Discharge Diagnoses:  Principal Problem:   Debility Active Problems:   Atrial fibrillation (HCC)   Bacteremia   PAD (peripheral artery disease) (HCC)   Anemia   Constipation   Chronic anticoagulation   Azotemia   Prostate cancer (HCC)   Peripheral edema   Hyponatremia   Discharged Condition: good  Significant Diagnostic Studies: DG Abd Portable 1V  Result Date: 09/16/2022 CLINICAL DATA:  Evaluate for fecal impaction EXAM: PORTABLE ABDOMEN - 1 VIEW COMPARISON:  None Available. FINDINGS: Scattered large and small bowel gas is noted. No free air is seen. No obstructive changes are noted. Mild retained fecal material is noted within the rectum which may represent early fecal impaction. Right hip replacement is noted. Degenerative changes of the left hip and lumbar spine are seen. IMPRESSION: Mild rectal impaction.  No obstructive changes are seen. Electronically Signed   By: Alcide Clever M.D.   On: 09/16/2022 19:01   PERIPHERAL VASCULAR CATHETERIZATION  Result Date: 09/15/2022 Images from the original result were not included.   Patient name: JAXXON NAEEM Sr.         MRN: [id number]        DOB: 1928-10-14          Sex: male  09/02/2022 Pre-operative Diagnosis: Right lower extremity critical ischemia with tissue loss at the toes Post-operative diagnosis:  Same Surgeon:  Victorino Sparrow, MD Procedure Performed: 1.  Ultrasound-guided micropuncture access of the left common femoral artery in retrograde fashion 2.  Aortogram 3.  Second order cannulation, right lower extremity angiogram 4.  Third order cannulation, right lower extremity angiogram from the superficial femoral artery 5.  Third order cannulation right lower extremity angiogram from the posterior tibial artery 6.  Balloon angioplasty of the tibioperoneal trunk 3 x 60 mm balloon 7.   Balloon angioplasty of the posterior tibial artery 2 x 220 mm balloon 8.  Device assisted closure-Pro-glide   Indications: Patient is a 89 year old gentleman who ambulates with the use of a walker with right-sided toe wounds.  He had nonpalpable pulses on physical exam with ABIs demonstrating severe peripheral arterial disease.  After discussing the risk and benefits of right lower extremity angiogram in an effort to define and improve distal perfusion for wound healing, Shaine elected to proceed.  Findings: Aortogram: No flow-limiting stenosis in the aortoiliac segments bilaterally On the right: Widely patent common femoral artery, profunda, superficial femoral artery, popliteal artery.  There is occlusion of the anterior tibial artery.  The tibioperoneal trunk is occluded with reconstitution of the posterior tibial artery distally.  This artery has multiple areas of tandem, flow-limiting stenosis, but runs into the foot.  The peroneal artery is atretic, reconstituting at the mid tibia with flow to the level of the ankle.             Procedure:  The patient was identified in the holding area and taken to room 8.  The patient was then placed supine on the table and prepped and draped in the usual sterile fashion.  A time out was called.  Ultrasound was used to evaluate the left common femoral artery.  It was patent .  A digital ultrasound image was acquired.  A micropuncture needle was used to access the left common femoral artery under ultrasound guidance.  An 018 wire was advanced without resistance and a micropuncture sheath was  placed.  The 018 wire was removed and a benson wire was placed.  The micropuncture sheath was exchanged for a 5 french sheath.  An omniflush catheter was advanced over the wire to the level of L-1.  An abdominal angiogram was obtained.  Next, using the omniflush catheter and a benson wire, the aortic bifurcation was crossed and the catheter was placed into theright external iliac artery and  right runoff was obtained.   I elected to attempt intervention on the tibioperoneal trunk, posterior tibial artery.  A 6 x 65 cm sheath was brought onto the field and parked in the superficial femoral artery.  Angiography followed to define the tibioperoneal trunk lesion.  The patient was heparinized.  A series of wires and catheters were used to traverse the tibioperoneal trunk occlusion.  True lumen was confirmed with the use of angiography from the posterior tibial artery.  Next, the wire and catheter were used to traverse the multiple, tandem, flow-limiting stenosis appreciated throughout the posterior tibial artery.  A 0.014 wire was used, and this was exchanged for a 0.018 wire for intervention.  A 3 mm x 60 mm angioplasty balloon was brought onto the field and inflated for 3 minutes across the tibioperoneal trunk occlusion.  Next, a 2 x 220 mm balloon was brought onto the field and inflated along the posterior tibial artery.  Follow-up angiography demonstrated excellent result with recanalization of the tibioperoneal trunk, resolution of flow-limiting stenosis in the posterior tibial artery, recanalization of the peroneal artery.   Impression: Successful recanalization of the tibioperoneal trunk using balloon angioplasty, successful resolution of flow-limiting stenosis within the posterior tibial artery using balloon angioplasty.  Completion angiography demonstrated widely patent tibioperoneal trunk with two-vessel runoff, peroneal to the level of the ankle, posterior tibial running into the foot and filling plantar branches.  Patient has been maximally revascularized.     Fara Olden, MD [facility] Office: [phone]    Labs:  Basic Metabolic Panel: Recent Labs  Lab 09/18/22 0549 09/19/22 0813 09/20/22 0638 09/22/22 1237 09/23/22 1012  NA 136 135 136 136 133*  K 3.2* 4.2 4.7 5.2* 4.8  CL 101 105 108 103 106  CO2 22 19* 23 23 21*  GLUCOSE 214* 149* 95 87 96   BUN 38* 28* 32* 25* 26*  CREATININE 1.27* 0.97 0.95 0.95 0.92  CALCIUM 9.1 8.3* 8.4* 9.2 8.8*    CBC:    Latest Ref Rng & Units 09/20/2022    6:38 AM 09/18/2022    5:49 AM 09/17/2022    6:26 AM  CBC  WBC 4.0 - 10.5 K/uL 7.0  8.2  7.6   Hemoglobin 13.0 - 17.0 g/dL 09.8  11.9  14.7   Hematocrit 39.0 - 52.0 % 31.2  41.7  31.5   Platelets 150 - 400 K/uL 315  137  371      CBG: No results for input(s): "GLUCAP" in the last 168 hours.  Brief HPI:   DANNEL HARALDSON Sr. is a 89 y.o. male with history of HTN, prostate cancer, 4 week history of BLE weakness progressing to difficulty walking, SOB with activity and sacral ulcer due to immobility. He was febrile in the ED, developed rigors had lactic acidosis as well as A-fib with bradycardic ventricular response.  He was started on IV antibiotics as well as IV fluids.  Chest CT was negative CT abdomen pelvis showed moderately distended back bladder as well as stool in the distal rectum with  fecal impaction as well as undetectable vitamin B12 level.  Blood cultures x 2 showed Aerococcus urinary and 1 positive for actinotignum Schaalii.  MRI of foot showed concerns of acute osteomyelitis right great toe MTH as well as plantar midfoot felt to be nonspecific myositis.  Vascular consulted for input and patient underwent balloon angioplasty of posterior tibial artery and tibioperoneal by Dr. Sherral Hammers on 05/16.    Dr. Patwardhan/cardiology recommended DOAC when patient was cleared from his surgeries.  He underwent a right fifth toe ray amputation on 05/22 by Dr. Lajoyce Corners and postop is to be touchdown weightbearing on RLE.  He was transition from IV heparin to Eliquis on 05/23 and completed antibiotic regimen for bacteremia most likely from urinary source.  ID recommended 2 additional weeks of empiric doxycycline and Augmentin tomorrow for any residual infection.  Leukocytosis has resolved and acute blood loss anemia was stable.  PT and OT was working with patient with  difficulty maintaining TTWB on RLE, had strong posterior lean on standing as well as weakness.  Patient was independent prior to admission and CIR was recommended due to functional decline.   Hospital Course: NAOD RUANE Sr. was admitted to rehab 09/10/2022 for inpatient therapies to consist of PT and OT at least three hours five days a week. Past admission physiatrist, therapy team and rehab RN have worked together to provide customized collaborative inpatient rehab. His bloo pressures were monitored on TID basis and have been stable. Wound VAC was removed on 05/29 and incision is healing well without s/s of infection.  Sutures remain intact and minimal serosanguineous drainage noted proximal aspect of incision. Boost added to help with protein stores and to promote wound healing. He was maintained on augmentation and doxycycline thru 06/05 for mop treatment and has tolerated this without side effects.   GI was consulted for input due to drop in Hgb to 7.1 with rise in BUN to 38 and concerns of GIB. Cunningham recommended monitoring for now as patient with negative FOBT and without overt signs of GI bleeding.  Rectal exam revealed evidence of severe constipation and KUB done showed question of rectal impaction.  He was disimpacted, started on aggressive bowel regimen with laxatives as well as suppository with good results.  He was transfused with 2 units PRBC and H&H is relatively stable around 10. Patient deferred any endoscopic evaluation and GI recommends workup if patient becomes Kathyrn Sheriff pending develops overt anemia with melena/hematochezia.  Serial check of lytes showed hyponatremia as resolved but due to persistent azotemia with rise in BUN to 45, he was treated with IVF X briefly.  K-Dur was added due to evidence of hypokalemia but potassium showed elevation to 5.2 therefore this was discontinued.  His weights have been  monitored daily  and noted to be on upward trend.  He was also noted to have  recurrent peripheral edema 2+.  Lasix was added for diuresis with repeat lites on 06/06 showing mild hyponatremia.  Recommend repeat check of electrolytes in 5 to 7 days and resume K-Dur for supplementation if needed. Pain is controlled with prn use of tylenol.  Sacral decub has been treated with local care close cleansing coccyx with normal saline, packing Using foam dressing for padding.  Also utilize to help manage incontinence and prevent breakdown of sacrum.  Left he is being painted with Betadine, cover with foam dressing and Prevalon boot for pressure relief whenever in bed.  Also recommend repeat elevating BLE whenever seated or in bed to  help with edema control to include. Lasix added on 06/5 and recommend BMET on Monday 06/11 to monitor potassium and renal status. He has made gains during his stay but continues to require moderate assist. Family has elected on SNF for progressive therapy and he was discharged to [facility] on 09/24/22.    Rehab course: During patient's stay in rehab weekly team conferences were held to monitor patient's progress, set goals and discuss barriers to discharge. At admission, patient required next 2+ to assist with [facility] for mobility and total assist with basic self-care task. He has had improvement in activity tolerance, balance, postural control as well as ability to compensate for deficits. He requires min assist with use of stedy for transfers. He is able to sit at  EOB with min assist. He requires min to mod assist with ADL tasks.    Wound care: 1.Cleanse area of breakdown with normal saline, pat dry and apply foam dressing. Change every 2-3 days or  more frequently if soiled. Routine pressure relief measures.   2. Cleanse right foot wound with soap and water, pat dry and apply a strip of Aquacell. Cover with 4X 4 and kerlix. Change daily and more frequently if soiled.      Disposition: Skilled Holiday representative.   Diet: Heart Healthy.    Special Instructions: No weight on right forefoot--use off loading shoe when out of bed Offer protein supplements TID to help with wound healing Use condom cath due to hx of prostate CA/incontinence. Follow up with Dr. Lajoyce Corners next week for suture removal.  3. Miralax daily if no BM and use Dulcolax suppository every other day as if no BM to avoid obstipation.     Discharge Instructions     Ambulatory referral to Physical Medicine Rehab   Complete by: As directed       Allergies as of 09/24/2022       Reactions   Codeine Swelling        Medication List     STOP taking these medications    alendronate 70 MG tablet Commonly known as: FOSAMAX   amoxicillin-clavulanate 875-125 MG tablet Commonly known as: AUGMENTIN   benazepril 10 MG tablet Commonly known as: LOTENSIN   bisacodyl 5 MG EC tablet Commonly known as: DULCOLAX   docusate sodium 100 MG capsule Commonly known as: COLACE   doxycycline 100 MG tablet Commonly known as: VIBRA-TABS   fish oil-omega-3 fatty acids 1000 MG capsule   leptospermum manuka honey Pste paste   ondansetron 4 MG tablet Commonly known as: ZOFRAN   oxyCODONE 5 MG immediate release tablet Commonly known as: Oxy IR/ROXICODONE   polyethylene glycol 17 g packet Commonly known as: MIRALAX / GLYCOLAX   zinc sulfate 220 (50 Zn) MG capsule       TAKE these medications    acetaminophen 500 MG tablet Commonly known as: TYLENOL Take 1 tablet (500 mg total) by mouth 2 (two) times daily with breakfast and lunch. What changed:  medication strength how much to take when to take this reasons to take this   alum & mag hydroxide-simeth 200-200-20 MG/5ML suspension Commonly known as: MAALOX/MYLANTA Take 15-30 mLs by mouth every 2 (two) hours as needed for indigestion.   apixaban 5 MG Tabs tablet Commonly known as: ELIQUIS Take 1 tablet (5 mg total) by mouth 2 (two) times daily.   ascorbic acid 1000 MG tablet Commonly known as:  VITAMIN C Take 1 tablet (1,000 mg total) by mouth daily.  aspirin EC 81 MG tablet Take 81 mg by mouth daily.   atorvastatin 40 MG tablet Commonly known as: LIPITOR Take 1 tablet (40 mg total) by mouth daily.   furosemide 20 MG tablet Commonly known as: LASIX Take 1 tablet (20 mg total) by mouth daily. What changed:  how much to take when to take this reasons to take this   guaiFENesin-dextromethorphan 100-10 MG/5ML syrup Commonly known as: ROBITUSSIN DM Take 15 mLs by mouth every 4 (four) hours as needed for cough.   multivitamin with minerals Tabs tablet Take 1 tablet by mouth daily.   nutrition supplement (JUVEN) Pack Take 1 packet by mouth 2 (two) times daily between meals.   omega-3 acid ethyl esters 1 g capsule Commonly known as: LOVAZA Take 1 capsule (1 g total) by mouth daily.   pantoprazole 40 MG tablet Commonly known as: PROTONIX Take 1 tablet (40 mg total) by mouth 2 (two) times daily.   senna-docusate 8.6-50 MG tablet Commonly known as: Senokot-S Take 1 tablet by mouth 2 (two) times daily.   tamsulosin 0.4 MG Caps capsule Commonly known as: FLOMAX Take 0.4 mg by mouth daily after supper.        Follow-up Information     Elfredia Nevins, MD Follow up.   Specialty: Internal Medicine Why: Call in 1-2 days for post hospital follow up Contact information: [address] [place] [address] [phone]         Elder Negus, MD Follow up.   Specialties: Cardiology, Radiology Why: Call in 1-2 days for post hospital follow up Contact information: [address] [place] [address] [phone]         [name], MD Follow up.   Specialty: Orthopedic Surgery Why: Call in 1-2 days for post hospital follow up Contact information: [address] [place] [address] [phone]         Genice Rouge, MD Follow up.   Specialty: Physical Medicine and Rehabilitation Why: office will call you  with follow up appointment Contact information: [address] [place] [address] [phone]                 Signed: Jacquelynn Cree 09/24/2022, 9:27 AM

## 2022-09-16 NOTE — Progress Notes (Addendum)
Order clarification with Dr. Berline Chough to remove wound vac today.  Called phlebotomy to inquire when they would be by to cross and match. Phlebotomist was with another patient an d will check the order when she is available.Marland Kitchen

## 2022-09-16 NOTE — Progress Notes (Signed)
Initial Nutrition Assessment  DOCUMENTATION CODES:   Obesity unspecified  INTERVENTION:  - Decrease Boost Plus to BID.   - Continue -1 packet Juven BID, each packet provides 95 calories, 2.5 grams of protein (collagen), and 9.8 grams of carbohydrate (3 grams sugar); also contains 7 grams of L-arginine and L-glutamine, 300 mg vitamin C, 15 mg vitamin E, 1.2 mcg vitamin B-12, 9.5 mg zinc, 200 mg calcium, and 1.5 g  Calcium Beta-hydroxy-Beta-methylbutyrate to support wound healing  NUTRITION DIAGNOSIS:   Increased nutrient needs related to wound healing as evidenced by estimated needs.  GOAL:   Patient will meet greater than or equal to 90% of their needs  MONITOR:   PO intake  REASON FOR ASSESSMENT:   Other (Comment) (Pressure Injury Screen)    ASSESSMENT:   89 y.o. male admits to CIR related to functional deficits related to debility secondary to Sepsis. PMH includes: HTN, kidney calculus, pneumonia, prostate cancer, SOB.  Meds reviewed: Vit C, lipitor, Vit B12, colace, lasix, MVI, Juven, lovaza, senokot, zinc sulfate. Labs reviewed: Na low, BUN elevated.   Pt was being seen by another provider at time of assessment. Per record, pt ate 80% of his breakfast this am. Pt has been eating 50-100% of his meals over the past 7 days. Pt is meeting his needs at this time. Pt currently has Boost Plus TID and Juven BID ordered. Per meds hx record, pt has consistently been drinking 2 Boost Plus a day. RD will decrease amount that is ordered.  Pt has also been accepting Juven supplements. RD will continue to monitor PO intakes.   NUTRITION - FOCUSED PHYSICAL EXAM:  Assess at f/u.   Diet Order:   Diet Order             Diet Heart Room service appropriate? Yes; Fluid consistency: Thin  Diet effective now                   EDUCATION NEEDS:   Not appropriate for education at this time  Skin:  Skin Assessment: Skin Integrity Issues: Skin Integrity Issues:: Incisions, DTI, Stage  II DTI: L heel Stage II: mid sacrum; R buttocks Incisions: R foot  Last BM:  5/29 - type 2  Height:   Ht Readings from Last 1 Encounters:  09/10/22 5\' 7"  (1.702 m)    Weight:   Wt Readings from Last 1 Encounters:  09/16/22 89.9 kg    Ideal Body Weight:     BMI:  Body mass index is 31.04 kg/m.  Estimated Nutritional Needs:   Kcal:  2200-2400 kcals  Protein:  110-120 gm  Fluid:  >/= 2 L  Bethann Humble, RD, LDN, CNSC.

## 2022-09-16 NOTE — Progress Notes (Signed)
Patient ID: Jesse Pollock Sr., male   DOB: 28-Aug-1928, 89 y.o.   MRN: [id number]  SW followed up with pt dtr Windell Moulding to discuss if the family has made a decision on the best plan of care. She intends to speak with her two brothers to discuss further. SW discussed family edu on Monday to give insight to the care pt will require. States she will follow-up with SW.   Cecile Sheerer, MSW, LCSWA Office: [phone] Cell: [phone] Fax: [phone]

## 2022-09-16 NOTE — Progress Notes (Signed)
Bedpan removed. Patient having medium type 5 bowel movement dark brown in color. Continues to endorse pressure on rectum area. Upon moving patient to lateral left side having bowel movement. Patient having difficulty passing fecal matter from rectum. Needing to be manually removed and disimpacted. Two large type 2/type 3 stools removed. With type 5 removed prior and afterwards. Some of the fecal matter having visible sangre. Patient tolerating disimpaction fairly well with some grimacing. After completion reporting a decrease in pressure and immediate relief.

## 2022-09-16 NOTE — Progress Notes (Signed)
Occupational Therapy Session Note  Patient Details  Name: Jesse KOOPMANN Sr. MRN: [id number] Date of Birth: 05-13-1928  Today's Date: 09/16/2022 OT Individual Time: 0800-0830 OT Individual Time Calculation (min): 30 min    Short Term Goals: Week 1:  OT Short Term Goal 1 (Week 1): Patient will flex forward to reach to feet while seated in wheelchair with min assist OT Short Term Goal 2 (Week 1): Patient will transition from an elevated surface sitting to partial standing (as needed for dressing) with max assist OT Short Term Goal 3 (Week 1): In preparation for toileting, patient will complete squat pivot transfer with max assist OT Short Term Goal 4 (Week 1): Patient will don/doff upper body clothing with set up assistance and increased time.  Skilled Therapeutic Interventions/Progress Updates:    Pt received supine with unrated pain in his foot, described as tender, agreeable to OT session. No request for pain intervention. He came to EOB with mod A for managing BLE and to elevate trunk. Sit > stand in the stedy with mod +2 assist from elevated bed. From elevated stedy seat he was able to stand several more time with CGA. Brief changed during static stand in the stedy with total A. He was transferred to the w/c. Grooming tasks at the sink with set up assist. He used BUE to stabilize tremors during oral care. He was left sitting up with all needs met, chair alarm set.    Therapy Documentation Precautions:  Precautions Precautions: Fall Precaution Comments: L heel, R buttocks pressure injuries; HoH Required Braces or Orthoses: Other Brace Other Brace: R post op shoe Restrictions Weight Bearing Restrictions: Yes RLE Weight Bearing: Touchdown weight bearing Other Position/Activity Restrictions: HOH   Therapy/Group: Individual Therapy  Crissie Reese 09/16/2022, 8:00 AM

## 2022-09-16 NOTE — Progress Notes (Signed)
Unable to contact Burley PA regarding new orders. Spoke to P. Love PA. Patient has a new order for a dulcolax suppository to be given at  1700. Patient is currently getting 1 of 2 units of PRBC, hold suppository until blood is completed. Also ok to have lab come and redraw Iron and TIBC until patient completes transfusion

## 2022-09-16 NOTE — Progress Notes (Signed)
Physical Therapy Session Note  Patient Details  Name: Jesse CURLING Sr. MRN: [id number] Date of Birth: [birth year]  Today's Date: 09/16/2022 PT Individual Time: 7425-9563 PT Individual Time Calculation (min): 40 min   Short Term Goals: Week 1:  PT Short Term Goal 1 (Week 1): Pt will tolerate sitting up between sessions. PT Short Term Goal 2 (Week 1): Pt will perform STS with + 1 assist PT Short Term Goal 3 (Week 1): Pt will ambulate >5 ft with LRAD  Skilled Therapeutic Interventions/Progress Updates: Pt presented in w/c agreeable to therapy. Pt denies pain at rest. Pt noted to be awaiting lab draws for transfusion, pt still appropriate for tx. Pt propelled w/c to day room for general conditioning with supervision and did not require rest break. Dance session ongoing in day room, pt participated in dance group with OT and recreational therapist. Pt performed activities primarily for UE and periscapular with pt engaging and appears in good spirits. PTA then noted that IV team arrived for IV placement. Pt transported back to room for energy conservation and time management. Pt set up with Stedy and performed Sit to stand with minA and transferred back to bed. Pt required heavy modA for sit to supine. Pt left in bed with IV nurse present, call bell within reach and needs met.      Therapy Documentation Precautions:  Precautions Precautions: Fall Precaution Comments: L heel, R buttocks pressure injuries; HoH Required Braces or Orthoses: Other Brace Other Brace: R post op shoe Restrictions Weight Bearing Restrictions: Yes RLE Weight Bearing: Touchdown weight bearing Other Position/Activity Restrictions: HOH General: PT Amount of Missed Time (min): 60 Minutes PT Missed Treatment Reason: Nursing care;Other (Comment) (pt receiving blood) Vital Signs: Therapy Vitals Temp: (!) 97.5 F (36.4 C) Temp Source: Oral Pulse Rate: (!) 59 Resp: 18 BP: (!) 144/62 Patient Position (if appropriate):  Lying Oxygen Therapy SpO2: 100 % O2 Device: Room Air Pain: Pain Assessment Pain Scale: 0-10 Pain Score: 0-No pain   Therapy/Group: Individual Therapy  Saryah Loper 09/16/2022, 4:07 PM

## 2022-09-16 NOTE — Consult Note (Addendum)
Consultation  Referring Provider:   Marissa Nestle, PA Primary Care Physician:  Elfredia Nevins, MD Primary Gastroenterologist:  Gentry Fitz       Reason for Consultation:   Worsening anemia on Eliquis         HPI:   Jesse MARECKI Sr. is a 89 y.o. male with past medical history significant for hypertension, prostate cancer, nephrolithiasis, progressive leg weakness, osteomyelitis right fifth MT status post right fifth amputation 08/2020, atrial fibrillation started on Eliquis, see full admission and history below. GI consulted for worsening anemia while on Eliquis.  Patient initially admitted 5/7 with progressive weakness, right foot ulcer, found to have bacteremia sepsis with atrial fibrillation. Found to have osteomyelitis of fifth MT, narrowed to Rocephin on 5/13 Positive blood cultures Aerococcus Urinae and 1+ for actinotignum Schaalii  5/16 status post balloon angioplasty of posterior tibial artery and tibioperoneal trunk by Dr. Sherral Hammers for decreased ABI 5/22 right fifth toe ray amputation by Dr. Lajoyce Corners 5/23 transition from heparin to Eliquis, last dose 5/30 in the morning Per bacteremia possible urinary source versus osteomyelitis ID recommended 2 weeks of doxycycline and Augmentin, first dose 5/25  the patient has been in physical rehab since 5/24. That admission he also had Negative CTA chest for PE.   08/24/2022 CT abdomen pelvis showed moderately distended bladder, stool in distal rectum with distention 6.7 cm with fecal impaction, otherwise no bowel obstruction or inflammatory changes.  Small hiatal hernia, no hepatic abnormality, gall bladder distended, no calcified stone biliary dilation, pancreas normal, spleen normal.  severe B12 deficiency Echo for peripheral edema showed EF 65 7% mild LVH mild MVR  Admitting hemoglobin 5/7 was 11.5, drifted to 8.9, up to 9.5, down to 8.9, 97.8, 7.4, 7.7 on the 25th and most recently today 7.1.  Patient has not had any blood transfusions, has  2 pending PRBCs  at this time. Patient diagnosed to have a macrocytosis,   Most recent B12 5/14 was 7000, compared   Undetectable level on admission. 5/14  iron 115, saturation ratio 60, ferritin 501. Last colonoscopy likely 2006, unknown results.  Pending fecal occult blood, no overt GI bleeding. BUN 3 days ago at 23, currently 38 with creatinine 1.07. Normal liver function.  Albumin 2.5 in setting of malnutrition. Patient was on Protonix once daily, will increase to twice daily 5/30.  Elderly appearing male, no family at bedside. Nurse currently in the room tried to hang the 2 units of blood that was ordered earlier today.  Has not had any yet. Patient states uncertain where he had colonoscopy, no prior history of GI bleeding. Patient states outpatient he did have reflux would only take Rolaids or Tums.  Very rare dysphagia if he took a large bite otherwise denies dysphagia or odynophagia.  Denies melena or hematochezia.   States bowel movements are every 2 to 3 days would have constipation and straining.  Patient came in with possible fecal impaction and distended colon, has not had rectal exam since being here.  Patient had normal at appetite prior to admission, states he has had some weight loss. Patient states he has chronic bilateral leg swelling, denies any shortness of breath or chest pain.  Has bilateral feet wrapped currently. Patient states he takes very rare Advil, denies any alcohol, smoking, drug use. Family history of GI malignancy.  Abnormal ED labs: Abnormal Labs Reviewed  COMPREHENSIVE METABOLIC PANEL - Abnormal; Notable for the following components:      Result Value  BUN 29 (*)    Calcium 8.4 (*)    Total Protein 4.9 (*)    Albumin 2.4 (*)    All other components within normal limits  CBC WITH DIFFERENTIAL/PLATELET - Abnormal; Notable for the following components:   RBC 2.26 (*)    Hemoglobin 7.7 (*)    HCT 24.3 (*)    MCV 107.5 (*)    MCH 34.1 (*)    RDW 16.5  (*)    All other components within normal limits  BASIC METABOLIC PANEL - Abnormal; Notable for the following components:   Sodium 133 (*)    Glucose, Bld 102 (*)    BUN 29 (*)    Calcium 8.3 (*)    All other components within normal limits  CBC WITH DIFFERENTIAL/PLATELET - Abnormal; Notable for the following components:   RBC 2.09 (*)    Hemoglobin 7.1 (*)    HCT 22.1 (*)    MCV 105.7 (*)    RDW 17.6 (*)    All other components within normal limits  COMPREHENSIVE METABOLIC PANEL - Abnormal; Notable for the following components:   Sodium 131 (*)    BUN 38 (*)    Calcium 8.3 (*)    Total Protein 5.0 (*)    Albumin 2.5 (*)    All other components within normal limits     Past Medical History:  Diagnosis Date   Hypertension    Kidney calculus 2014;2013   Pneumonia    Prostate cancer (HCC)    Shortness of breath     Surgical History:  He  has a past surgical history that includes Joint replacement (Right, 1993;1980); Colonoscopy; Cataract extraction w/PHACO (Right, 02/13/2013); Cataract extraction w/PHACO (Left, 02/27/2013); Amputation (Right, 09/08/2022); and ABDOMINAL AORTOGRAM W/LOWER EXTREMITY (N/A, 09/02/2022). Family History:  His family history is not on file. Social History:   reports that he has never smoked. He has never used smokeless tobacco. He reports that he does not drink alcohol and does not use drugs.  Prior to Admission medications   Medication Sig Start Date End Date Taking? Authorizing Provider  acetaminophen (TYLENOL) 325 MG tablet Take 1-2 tablets (325-650 mg total) by mouth every 6 (six) hours as needed for mild pain (pain score 1-3 or temp > 100.5). 09/10/22  Yes Esaw Grandchild A, DO  alendronate (FOSAMAX) 70 MG tablet TAKE 1 TABLET EVERY WEEK IN THE MORNING 30 MIN BEFORE EATING WITH AN 8OZ GLASS OF WATER (SIT UP 30 MIN) 07/12/22  Yes Marcine Matar, MD  aspirin EC 81 MG tablet Take 81 mg by mouth daily.   Yes [provider]  fish  oil-omega-3 fatty acids 1000 MG capsule Take 1 g by mouth daily.   Yes [provider]  tamsulosin (FLOMAX) 0.4 MG CAPS capsule Take 0.4 mg by mouth daily after supper.   Yes [provider]  zinc sulfate 220 (50 Zn) MG capsule Take 1 capsule (220 mg total) by mouth daily. 09/11/22  Yes Esaw Grandchild A, DO  alum & mag hydroxide-simeth (MAALOX/MYLANTA) 200-200-20 MG/5ML suspension Take 15-30 mLs by mouth every 2 (two) hours as needed for indigestion. 09/10/22   Esaw Grandchild A, DO  amoxicillin-clavulanate (AUGMENTIN) 875-125 MG tablet Take 1 tablet by mouth every 12 (twelve) hours. Take through June 5. 09/10/22   Pennie Banter, DO  apixaban (ELIQUIS) 5 MG TABS tablet Take 1 tablet (5 mg total) by mouth 2 (two) times daily. 09/10/22   Esaw Grandchild A, DO  ascorbic acid (VITAMIN  C) 1000 MG tablet Take 1 tablet (1,000 mg total) by mouth daily. 09/11/22   Pennie Banter, DO  atorvastatin (LIPITOR) 40 MG tablet Take 1 tablet (40 mg total) by mouth daily. 09/11/22   Esaw Grandchild A, DO  benazepril (LOTENSIN) 10 MG tablet Take 10 mg by mouth daily. 05/15/19   [provider]  bisacodyl (DULCOLAX) 5 MG EC tablet Take 1 tablet (5 mg total) by mouth daily as needed for moderate constipation. 09/10/22   Pennie Banter, DO  docusate sodium (COLACE) 100 MG capsule Take 1 capsule (100 mg total) by mouth daily. 09/11/22   Pennie Banter, DO  doxycycline (VIBRA-TABS) 100 MG tablet Take 1 tablet (100 mg total) by mouth every 12 (twelve) hours. Take through June 5. 09/10/22   Pennie Banter, DO  furosemide (LASIX) 20 MG tablet Take 2 tablets (40 mg total) by mouth daily as needed (swelling or weight gain > 5 lbs in 3 days). 09/10/22   Pennie Banter, DO  guaiFENesin-dextromethorphan (ROBITUSSIN DM) 100-10 MG/5ML syrup Take 15 mLs by mouth every 4 (four) hours as needed for cough. 09/10/22   Pennie Banter, DO  leptospermum manuka honey (MEDIHONEY) PSTE paste Apply 1  Application topically daily. 09/11/22   Pennie Banter, DO  Multiple Vitamin (MULTIVITAMIN WITH MINERALS) TABS tablet Take 1 tablet by mouth daily. 09/11/22   Pennie Banter, DO  nutrition supplement, JUVEN, (JUVEN) PACK Take 1 packet by mouth 2 (two) times daily between meals. 09/10/22   Pennie Banter, DO  omega-3 acid ethyl esters (LOVAZA) 1 g capsule Take 1 capsule (1 g total) by mouth daily. 09/11/22   Esaw Grandchild A, DO  ondansetron (ZOFRAN) 4 MG tablet Take 1 tablet (4 mg total) by mouth every 6 (six) hours as needed for nausea. 09/10/22   Esaw Grandchild A, DO  oxyCODONE (OXY IR/ROXICODONE) 5 MG immediate release tablet Take 1-2 tablets (5-10 mg total) by mouth every 4 (four) hours as needed for moderate pain (pain score 4-6). 09/10/22   Pennie Banter, DO  polyethylene glycol (MIRALAX / GLYCOLAX) 17 g packet Take 17 g by mouth daily as needed for moderate constipation. 09/10/22   Pennie Banter, DO  senna-docusate (SENOKOT-S) 8.6-50 MG tablet Take 1 tablet by mouth 2 (two) times daily. 09/10/22   Pennie Banter, DO    Current Facility-Administered Medications  Medication Dose Route Frequency Provider Last Rate Last Admin   0.9 %  sodium chloride infusion (Manually program via Guardrails IV Fluids)   Intravenous Once Love, Pamela S, PA-C       0.9 %  sodium chloride infusion (Manually program via Guardrails IV Fluids)   Intravenous Once Love, Pamela S, PA-C       acetaminophen (TYLENOL) tablet 325-650 mg  325-650 mg Oral Q4H PRN Love, Pamela S, PA-C       acetaminophen (TYLENOL) tablet 500 mg  500 mg Oral BID WC Lovorn, Megan, MD   500 mg at 09/16/22 1610   acetaminophen (TYLENOL) tablet 650 mg  650 mg Oral Once Love, Pamela S, PA-C       alum & mag hydroxide-simeth (MAALOX/MYLANTA) 200-200-20 MG/5ML suspension 30 mL  30 mL Oral Q4H PRN Love, Pamela S, PA-C       amoxicillin-clavulanate (AUGMENTIN) 875-125 MG per tablet 1 tablet  1 tablet Oral Q12H Pham, Minh Q, RPH-CPP   1  tablet at 09/16/22 0735   apixaban (ELIQUIS) tablet 5 mg  5 mg  Oral BID Jacquelynn Cree, PA-C   5 mg at 09/16/22 1610   ascorbic acid (VITAMIN C) tablet 1,000 mg  1,000 mg Oral Daily Jacquelynn Cree, PA-C   1,000 mg at 09/16/22 9604   aspirin EC tablet 81 mg  81 mg Oral Daily Jacquelynn Cree, PA-C   81 mg at 09/16/22 0735   atorvastatin (LIPITOR) tablet 40 mg  40 mg Oral Daily Jacquelynn Cree, PA-C   40 mg at 09/16/22 5409   bisacodyl (DULCOLAX) suppository 10 mg  10 mg Rectal Daily PRN Love, Pamela S, PA-C       cyanocobalamin (VITAMIN B12) injection 1,000 mcg  1,000 mcg Intramuscular Daily Jacquelynn Cree, PA-C   1,000 mcg at 09/16/22 8119   diphenhydrAMINE (BENADRYL) capsule 25 mg  25 mg Oral Q6H PRN Love, Pamela S, PA-C       diphenhydrAMINE (BENADRYL) capsule 25 mg  25 mg Oral Once Love, Pamela S, PA-C       docusate sodium (COLACE) capsule 100 mg  100 mg Oral Daily Jacquelynn Cree, PA-C   100 mg at 09/16/22 1478   doxycycline (VIBRA-TABS) tablet 100 mg  100 mg Oral Q12H Pham, Minh Q, RPH-CPP   100 mg at 09/16/22 2956   EPINEPHrine (EPI-PEN) injection 0.3 mg  0.3 mg Intramuscular Once PRN Love, Pamela S, PA-C       furosemide (LASIX) injection 20 mg  20 mg Intravenous Once Love, Pamela S, PA-C       furosemide (LASIX) injection 20 mg  20 mg Intravenous Once Love, Pamela S, PA-C       furosemide (LASIX) tablet 20 mg  20 mg Oral Daily PRN Ranelle Oyster, MD       guaiFENesin-dextromethorphan (ROBITUSSIN DM) 100-10 MG/5ML syrup 5-10 mL  5-10 mL Oral Q6H PRN Love, Pamela S, PA-C       ipratropium-albuterol (DUONEB) 0.5-2.5 (3) MG/3ML nebulizer solution 3 mL  3 mL Nebulization Q4H PRN Love, Pamela S, PA-C       lactose free nutrition (BOOST PLUS) liquid 237 mL  237 mL Oral BID BM Lovorn, Megan, MD       multivitamin with minerals tablet 1 tablet  1 tablet Oral Daily Jacquelynn Cree, PA-C   1 tablet at 09/16/22 2130   nutrition supplement (JUVEN) (JUVEN) powder packet 1 packet  1 packet Oral BID BM  Jacquelynn Cree, PA-C   1 packet at 09/16/22 1054   omega-3 acid ethyl esters (LOVAZA) capsule 1 g  1 g Oral Daily Jacquelynn Cree, PA-C   1 g at 09/16/22 8657   oxyCODONE (Oxy IR/ROXICODONE) immediate release tablet 10-15 mg  10-15 mg Oral Q4H PRN Love, Pamela S, PA-C       pantoprazole (PROTONIX) EC tablet 40 mg  40 mg Oral BID Love, Pamela S, PA-C       phenol (CHLORASEPTIC) mouth spray 1 spray  1 spray Mouth/Throat PRN Love, Pamela S, PA-C       prochlorperazine (COMPAZINE) tablet 5-10 mg  5-10 mg Oral Q6H PRN Love, Pamela S, PA-C       Or   prochlorperazine (COMPAZINE) suppository 12.5 mg  12.5 mg Rectal Q6H PRN Love, Pamela S, PA-C       Or   prochlorperazine (COMPAZINE) injection 5-10 mg  5-10 mg Intramuscular Q6H PRN Love, Pamela S, PA-C       senna-docusate (Senokot-S) tablet 1 tablet  1 tablet Oral BID Jacquelynn Cree, PA-C  1 tablet at 09/16/22 0735   sodium phosphate (FLEET) 7-19 GM/118ML enema 1 enema  1 enema Rectal Once PRN Love, Pamela S, PA-C       tamsulosin [facility]) capsule 0.4 mg  0.4 mg Oral QPC supper Love, Pamela S, PA-C   0.4 mg at 09/15/22 1641   zinc sulfate capsule 220 mg  220 mg Oral Daily Jacquelynn Cree, PA-C   220 mg at 09/16/22 1610    Allergies as of 09/10/2022 - Review Complete 09/10/2022  Allergen Reaction Noted   Codeine Swelling 02/07/2013    Review of Systems:    Constitutional: No weight loss, fever, chills, weakness or fatigue HEENT: Eyes: No change in vision               Ears, Nose, Throat:  No change in hearing or congestion Skin: No rash or itching Cardiovascular: No chest pain, chest pressure or palpitations   Respiratory: No SOB or cough Gastrointestinal: See HPI and otherwise negative Genitourinary: No dysuria or change in urinary frequency Neurological: No headache, dizziness or syncope Musculoskeletal: No new muscle or joint pain Hematologic: No bleeding or bruising Psychiatric: No history of depression or anxiety     Physical Exam:   Vital signs in last 24 hours: Temp:  [98 F (36.7 C)-98.4 F (36.9 C)] 98.4 F (36.9 C) (05/30 0415) Pulse Rate:  [52-61] 52 (05/30 0415) Resp:  [16] 16 (05/30 0415) BP: (120-134)/(50) 120/50 (05/30 0415) SpO2:  [100 %] 100 % (05/30 0415) Weight:  [89.9 kg] 89.9 kg (05/30 0725) Last BM Date : 09/14/22 Last BM recorded by nurses in past 5 days Stool Type: Type 2 (Lump and sausage like) (09/15/2022  8:51 PM)  General:   Pleasant, hard of hearing elderly appearing AA male in no acute distress Head:  Normocephalic and atraumatic. Eyes: sclerae anicteric,conjunctive pale  Heart:  regular rate and rhythm, no murmurs or gallops Pulm: Clear anteriorly; no wheezing Abdomen:  Soft, Obese AB, Sluggish bowel sounds. No tenderness, No organomegaly appreciated. Rectal: Normal external rectal exam, decreased rectal tone, no internal hemorrhoids appreciated, no masses, LARGE volume soft brown stool in cavernous rectal vault, hemoccult Negative at bedside Extremities:  With bilateral edema, 2-3+, stasis dermatitis, bilateral legs wrapped.. Msk:  Symmetrical without gross deformities. Peripheral pulses intact.  Neurologic:  Alert and oriented x4; intention tremor right hand worse than left, slight tremor and voice Skin:   Dry and intact without significant lesions or rashes. Psychiatric:  Cooperative. Normal mood and affect.  LAB RESULTS: Recent Labs    09/16/22 0553  WBC 7.1  HGB 7.1*  HCT 22.1*  PLT 345   BMET Recent Labs    09/16/22 0553  NA 131*  K 4.8  CL 100  CO2 24  GLUCOSE 91  BUN 38*  CREATININE 1.07  CALCIUM 8.3*   LFT Recent Labs    09/16/22 0553  PROT 5.0*  ALBUMIN 2.5*  AST 39  ALT 37  ALKPHOS 54  BILITOT 0.7   PT/INR No results for input(s): "LABPROT", "INR" in the last 72 hours.  STUDIES: No results found.    Impression    Acute on chronic anemia, no overt GI bleeding Slowly downtrending since hospitalization for osteomyelitis/bacteremia on Eliquis  for A-fib started 5/23, last dose 5/30 in the morning Admitting hemoglobin 5/7 HGB 11.5 has slowly drifted from 9-8, and today at 7.1, patient did have elevation of BUN out of portion to creatinine. without any overt GI bleeding, negative fecal occult blood  at bedside Previous iron and ferritin normal, iron 115, saturation ratio 60, ferritin 500 on 5/14 Previous colonoscopy potentially 2006, unable to see report. Previously patient had reflux, has been on doxycycline since 5/25, no dysphagia Patient with history of constipation, CT 5/7 showed stool in distal rectum with distention 6.7 cm with fecal impaction, no obvious masses or lymphadenopathy  Constipation with fecal impaction and distal rectum distention 6.7 cm -Patient has been on Colace 100 mg daily Senokot twice daily. As needed Dulcolax suppository which has not been given, and Fleet enema which has not been given. Large volume soft stool in rectum on rectal exam  A-fib On Eliquis since 5/25 transition from heparin Last dose 5/30 in the morning  Bacteremia/osteomyelitis On oral medications at this time, afebrile Status post right MT amputation 08/2018   Principal Problem:   Debility    LOS: 6 days     Plan   89 year old male with recent osteomyelitis/bacteremia, status post right ray MT amputation on 5/22, started on Eliquis 5/23 for A-fib, has been on antibiotics since 5/7 currently on day 5 of doxycycline and Augmentin, history of GERD, negative FOBt at bedside, elevated BUN.  Reassuring negative fecal occult blood at bedside possible gastritis, esophagitis, PUD secondary to doxycycline use, possible bone marrow suppression with osteomyelitis/bacteremia, possible stercoral ulcer with fecal impaction, versus AVMs, malignancy.   -Suggest still sending off for FOBT -Agree with Protonix 40 mg twice daily -Suggest potentially discussing with ID and seeing if they could switch doxycycline or please do precautions with  medication for esophagitis, gastritis. -Monitor H&H, transfuse greater than 7, pending 2 units today. -Hold Eliquis, question if needed long-term or just for 30 days and 89 year old. -Will check reticulocyte count, iron and ferritin. -Will get blood KUB to evaluate stool burden, rule out obstruction, evaluate dilation -Suggest improving bowel regiment, Dulcolax suppository daily for 2 days, consider Fleet enema pending results.  At this point with no overt GI bleeding, negative fecal occult blood at bedside, and patient's age of 89, and patient preference will not plan for endoscopic evaluation unless overt GI bleeding, transfusion dependent anemia, or new symptoms. Further recommendations per Dr. Tomasa Rand   Thank you for your kind consultation, we will continue to follow.   Doree Albee  09/16/2022, 1:54 PM

## 2022-09-16 NOTE — Progress Notes (Signed)
Occupational Therapy Session Note  Patient Details  Name: Jesse LAUNER Sr. MRN: [id number] Date of Birth: [birth year]  Today's Date: 09/16/2022 OT Individual Time: 1330-1415 OT Individual Time Calculation (min): 45 min    Short Term Goals: Week 1:  OT Short Term Goal 1 (Week 1): Patient will flex forward to reach to feet while seated in wheelchair with min assist OT Short Term Goal 2 (Week 1): Patient will transition from an elevated surface sitting to partial standing (as needed for dressing) with max assist OT Short Term Goal 3 (Week 1): In preparation for toileting, patient will complete squat pivot transfer with max assist OT Short Term Goal 4 (Week 1): Patient will don/doff upper body clothing with set up assistance and increased time.  Skilled Therapeutic Interventions/Progress Updates:    Patient agreeable to participate in OT session. Reports 0/10 pain level.  Reports occasional left hip pain when sitting in a certain position. No report of pain during session.   - Patient participated in skilled OT session focusing on endurance and activity tolerance.  Therapist facilitated upright posture with antigravity extension sitting EOB in order to improve scapular alignment and sitting posture allowing patient to complete upper body bathing and dressing with improved balance and decreased difficulty with family member or staff. Intensity of movement and repetitions based on level of excursion and visible signs of fatigue such as increased respirations.  Weight shifting with extended arm reaching completed seated. Pt completed 5-10X with each UE while being provided VC and visual demonstration for form and technique. Pt with improved sitting posture after completion.   - Max Assist provided to transition from sit to supine. VC provided to utilized bed rail and go down on left arm prior to rolling onto back. Supine anterior extension performed 5X with max difficulty to maintain proper form and  technique even with verbal cues and visual demonstration.   - Supine BUE shoulder flexion and horizontal abduction performed 10X, 1 set with VC and visual demonstration for proper form and technique. Pt education provided to complete supine exercises on his own time to continue to improve sitting posture and balance in order to perform BADL tasks with increased independence. Pt verbalize understanding.     Therapy Documentation Precautions:  Precautions Precautions: Fall Precaution Comments: L heel, R buttocks pressure injuries; HoH Required Braces or Orthoses: Other Brace Other Brace: R post op shoe Restrictions Weight Bearing Restrictions: Yes RLE Weight Bearing: Touchdown weight bearing Other Position/Activity Restrictions: HOH  Therapy/Group: Individual Therapy  Limmie Patricia, OTR/L,CBIS  Supplemental OT - [facility] and [facility] Secure Chat Preferred   09/16/2022, 8:40 AM

## 2022-09-16 NOTE — Telephone Encounter (Signed)
[name] from [facility] called. Patient had a transmet amputation with Dr.Duda. They were given orders for patient to keep wound vac on for 7 days. Patient still has some drainage, about 15cc since Monday. She wants to know if patient should keep wound vac on until it is dry, or go ahead and remove? [phone]

## 2022-09-16 NOTE — Telephone Encounter (Signed)
Informed [name] okay to take off wound vac. Surgery and wound vac placed on 09/08/22.

## 2022-09-16 NOTE — Plan of Care (Signed)
Wound Plan   Braden Score: 14  Sensory: 3  Moisture: 3  Activity: 2  Mobility: 2  Nutrition: 3  Friction: 1   Wounds present:   Pressure Injury 09/10/22 Medial Sacrum Stage II (POA) Pressure Injury 08/25/22 DTI Left Heel  (POA) Pressure Injury 08/25/22 Right Buttocks Stage II (POA)    Interventions:   Ascorbic Acid Boost Plus TID Multivitamin daily Juven BID Zinc Sulfate Medihoney discontinued 09/16/22 Time toileting/condom catheter Bilateral Prevalon boots Foam dressing to coccyx   Attendees/ Contributors:   Vedia Pereyra, RN Emiliano Dyer, RN

## 2022-09-16 NOTE — Progress Notes (Addendum)
PROGRESS NOTE   Subjective/Complaints:  Pt asked if can continue to use urinal to try and be in control of urinary issues.  Slept OK, but had to wake to void multiple times.  No BM in 3 days  VAC to be removed tomorrow.   ROS:  Pt denies SOB, abd pain, CP, N/V/ (+)C/D, and vision changes   Except for HPI Objective:   No results found. Recent Labs    09/16/22 0553  WBC 7.1  HGB 7.1*  HCT 22.1*  PLT 345    Recent Labs    09/16/22 0553  NA 131*  K 4.8  CL 100  CO2 24  GLUCOSE 91  BUN 38*  CREATININE 1.07  CALCIUM 8.3*    Intake/Output Summary (Last 24 hours) at 09/16/2022 0846 Last data filed at 09/16/2022 0754 Gross per 24 hour  Intake 1135 ml  Output 1300 ml  Net -165 ml     Pressure Injury 08/25/22 Heel Left Deep Tissue Pressure Injury - Purple or maroon localized area of discolored intact skin or blood-filled blister due to damage of underlying soft tissue from pressure and/or shear. 6.5cm x 5.5cm (Active)  08/25/22 0730  Location: Heel  Location Orientation: Left  Staging: Deep Tissue Pressure Injury - Purple or maroon localized area of discolored intact skin or blood-filled blister due to damage of underlying soft tissue from pressure and/or shear.  Wound Description (Comments): 6.5cm x 5.5cm  Present on Admission: Yes     Pressure Injury 08/25/22 Buttocks Right Stage 2 -  Partial thickness loss of dermis presenting as a shallow open injury with a red, pink wound bed without slough. (Active)  08/25/22   Location: Buttocks  Location Orientation: Right  Staging: Stage 2 -  Partial thickness loss of dermis presenting as a shallow open injury with a red, pink wound bed without slough.  Wound Description (Comments):   Present on Admission: Yes     Pressure Injury 09/10/22 Sacrum Medial Stage 2 -  Partial thickness loss of dermis presenting as a shallow open injury with a red, pink wound bed without  slough. Elongated area over sacrum (Active)  09/10/22 1912  Location: Sacrum  Location Orientation: Medial  Staging: Stage 2 -  Partial thickness loss of dermis presenting as a shallow open injury with a red, pink wound bed without slough.  Wound Description (Comments): Elongated area over sacrum  Present on Admission: Yes    Physical Exam: Vital Signs Blood pressure (!) 120/50, pulse (!) 52, temperature 98.4 F (36.9 C), temperature source Oral, resp. rate 16, height 5\' 7"  (1.702 m), weight 89.9 kg, SpO2 100 %.       General: awake, alert, appropriate, very HOH- OT in room; sitting up some in bed; NAD HENT: conjugate gaze; oropharynx a little dry CV: irregular rhythm, bradycardic rate; no JVD Pulmonary: CTA B/L; no W/R/R- good air movement- doesn't sounds like fluid overload GI: soft, NT, ND, (+)BS- less protuberant- more normoactive Psychiatric: appropriate- flat Neurological: Ox3 Skin: VAC on RLE-- dressing too tight- relieved pressure by cutting dressing so not as tight  Extremities: 1-2+ LE edema- worse on RLE- causing too much tightness like a tourniquet around  R ankle due to swelling  Neurologic: Cranial nerves II through XII intact, motor strength is 5/5 in bilateral deltoid, bicep, tricep, grip, 3 - bilateral hip flexor, 4/5 knee extensors, ankle dorsiflexor and plantar flexor Sensory exam normal sensation to light touchin bilateral lower extremities  Musculoskeletal: Full range of motion in all 4 extremities. No joint swelling   Assessment/Plan: 1. Functional deficits which require 3+ hours per day of interdisciplinary therapy in a comprehensive inpatient rehab setting. Physiatrist is providing close team supervision and 24 hour management of active medical problems listed below. Physiatrist and rehab team continue to assess barriers to discharge/monitor patient progress toward functional and medical goals  Care Tool:  Bathing    Body parts bathed by patient: Right  arm, Left arm, Chest, Abdomen, Front perineal area, Right upper leg, Left upper leg, Face   Body parts bathed by helper: Buttocks, Left lower leg, Right lower leg     Bathing assist Assist Level: Maximal Assistance - Patient 24 - 49%     Upper Body Dressing/Undressing Upper body dressing   What is the patient wearing?: Pull over shirt    Upper body assist Assist Level: Moderate Assistance - Patient 50 - 74%    Lower Body Dressing/Undressing Lower body dressing      What is the patient wearing?: Pants, Incontinence brief     Lower body assist Assist for lower body dressing: Dependent - Patient 0%     Toileting Toileting    Toileting assist Assist for toileting: 2 Helpers     Transfers Chair/bed transfer  Transfers assist     Chair/bed transfer assist level: 2 Helpers     Locomotion Ambulation   Ambulation assist   Ambulation activity did not occur: Safety/medical concerns          Walk 10 feet activity   Assist  Walk 10 feet activity did not occur: Safety/medical concerns        Walk 50 feet activity   Assist Walk 50 feet with 2 turns activity did not occur: Safety/medical concerns         Walk 150 feet activity   Assist Walk 150 feet activity did not occur: Safety/medical concerns         Walk 10 feet on uneven surface  activity   Assist Walk 10 feet on uneven surfaces activity did not occur: Safety/medical concerns         Wheelchair     Assist Is the patient using a wheelchair?: Yes (Pt left in w/c but did not attempt propulsion or dependent transport.) Type of Wheelchair: Manual           Wheelchair 50 feet with 2 turns activity    Assist    Wheelchair 50 feet with 2 turns activity did not occur: Safety/medical concerns       Wheelchair 150 feet activity     Assist  Wheelchair 150 feet activity did not occur: Safety/medical concerns       Blood pressure (!) 120/50, pulse (!) 52, temperature  98.4 F (36.9 C), temperature source Oral, resp. rate 16, height 5\' 7"  (1.702 m), weight 89.9 kg, SpO2 100 %.  Medical Problem List and Plan: 1. Functional deficits secondary to debility related to sepsis from Right 5th MT osteomyelitis and other medical complications. Pt is s/p right 5th ray amputation 5/22.             -TDWB RLE             -patient  may not yet shower due to VAC             -ELOS/Goals: 21-24 days,  Pt's goals mod A- daughter cannot provide this- looking at possible SNF Con't CIR PT and OT- will remove VAC today- can get in shower after VAC removed if covers incision/wounds 2.  Antithrombotics: -DVT/anticoagulation:  Pharmaceutical: Eliquis             -antiplatelet therapy: ASA 81mg  daily 3. Pain Management:  Oxycodone prn. 5/27- has very little pain- mainly stinging- con't mainly tylenol prn  4. Mood/Behavior/Sleep: LCSW to follow for evaluation and support.              -antipsychotic agents: N/A             -Melatonin prn for insomnia.  5. Neuropsych/cognition: This patient is capable of making decisions on his own behalf. 6. Skin/Wound Care: Continue wound VAC for now.              -pressure relief and foam dressings to left heel, buttocks wounds. Medihoney with gauze   under foam dressing for left heel..  5/27- will need to check when [facility] can be removed on R foot. - Dr Lajoyce Corners is surgeon  5/28- will d/w Rinaldo Cloud to see if can check when can be removed- surgery was 5/22- so been 6 days?  5/29- can get removed 5/29-  7. Fluids/Electrolytes/Nutrition: Strict I/O w/daily weights             --Add  boost to help w/protein stores and promote healing.              --On zinc and Vitamin C 8. Bacteremia with osteomyelitis: Completed IV antibiotics and s/p Ray amputation             --Mop tx with Augmentin/doxycycline through June 5 9. Hyponatremia/pre-renal azotemia: Na improving form 131-->134. BUN with rise to 32.             --encourage fluid intake and should improve off  Lasix.  --hyponatremia resolved off HCTZ. 5/27- BUN still 29- if rises above 30, will give IVFs- due to hx of fluid overload, will not add IVFs before then 5/28- will recheck Thursday 5/30- BUN up to 38- but Weight up 4 kg- will try giving Lasix due to fluid overload- in tissues-  and if not better, will call IM 10. ABLA/B12 deficiency: Continue to monitor. Has high ferritin level with MCV 104. -daily B12 injections until discharge from rehab -transfuse for hgb <7 5/27- Hb stable at 7.7- will monitor trends  11. Vitamin B 12 deficiency: Vitam in B 12< 50 with recheck after supplementation > 7,500 12. Fluid overload: Daily wts with strict I/O. Monitor for signs of overload.  --has been on IV lasix 20 mg since 05/08 w/recs to change to to prn. -will change lasix to 20mg  daily prn and monitor weights daily.  --will add low salt diet.  5/27- will add Daily weights- and monitor trend 5/29- d/w nursing about getting pt weighed again.  5/30- weight up 4 kg- wil give Lasix 40 mg x1 and monitor weight if needs to give more tomorrow.  13. Prostate cancer             -monitor voiding patterns             -flomax             -f/u as outpt with Dr. Retta Diones  5/28- s/p radiation for prostate CA- likely  cause for Incontinence 14. New onset A fib:             -rate is controlled             -transitioned to eliquis by cardiology, continues on ASA as well  5/27- HR in 50s-60s- sounds like still in some Afib- con't regimen  5/29- in/out of afib- rate controlled- con't regimen  15. Urinary incontinence  5/27- will stop Purewick for now- put urinal where pt could reach- if has a lot of incontinence due to prostate CA- might need purewick at night- will see trends. 5/28- having urinary incontinence per pt- had multiple rounds of radiation for prostate CA- likely cause for urinary incontinence-  will see if can do purewick at night- and d/w team   5/29- d/w nursing- can do condom cath, however pt really  wants to try urinal- so will not place condom cath at this time. 5/30- condom catheter placed  16. Constipation  5/29- will give sorbitol 30cc and if doesn't work, will give soap suds enema  5/30- LBM last night after soap suds enema- large  I spent a total of 51   minutes on total care today- >50% coordination of care- due to d/w OT about swelling and nursing about removing VAC- as well as daily weights- needs to get Lasix due to weight gain; VAC off today- also complex medical issues with BUN of 38 and Lasix needs/third spacing.    Addendum- pt's Hb 7.1- trending down pretty fast- checking guaiac and giving transfusion today. And will call GI IF giaiac is (+)    LOS: 6 days A FACE TO FACE EVALUATION WAS PERFORMED  [name] 09/16/2022, 8:46 AM

## 2022-09-16 NOTE — Progress Notes (Addendum)
During assessment of abdomen right iliac area harden area, which patient reports tenderness to touch. Occult stool sample collected. Endorsing pressure throughout the evening. Stool observed in rectum. Stool removed and changed after completion of blood transfusion. Shortly after more stool removed. Suppository given per [facility] medication on hold earlier due to blood transfusion ongoing. Stool having to be manually removed prior to inserting scheduled suppository and dig stim following suppository. Patient placed on bedpan due to actively having ongoing bowel movements.

## 2022-09-16 NOTE — Progress Notes (Signed)
Physical Therapy Note  Patient Details  Name: Jesse BARBOT Sr. MRN: [id number] Date of Birth: 09/13/1928 Today's Date: 09/16/2022    Attempted to see pt for scheduled session, but nsg present to hang blood transfusion and GI consult present in room. Will attempt at later date as schedule allows. Pt missed 60 min d/t blood transfusion.    [name] 09/16/2022, 4:05 PM

## 2022-09-17 DIAGNOSIS — Z7901 Long term (current) use of anticoagulants: Secondary | ICD-10-CM

## 2022-09-17 LAB — IRON AND TIBC
Iron: 66 ug/dL (ref 45–182)
Saturation Ratios: 26 % (ref 17.9–39.5)
TIBC: 253 ug/dL (ref 250–450)
UIBC: 187 ug/dL

## 2022-09-17 LAB — OCCULT BLOOD X 1 CARD TO LAB, STOOL: Fecal Occult Bld: NEGATIVE

## 2022-09-17 LAB — CBC WITH DIFFERENTIAL/PLATELET
Abs Immature Granulocytes: 0.01 10*3/uL (ref 0.00–0.07)
Basophils Absolute: 0.1 10*3/uL (ref 0.0–0.1)
Basophils Relative: 1 %
Eosinophils Absolute: 0.3 10*3/uL (ref 0.0–0.5)
Eosinophils Relative: 4 %
HCT: 31.5 % — ABNORMAL LOW (ref 39.0–52.0)
Hemoglobin: 10.6 g/dL — ABNORMAL LOW (ref 13.0–17.0)
Immature Granulocytes: 0 %
Lymphocytes Relative: 39 %
Lymphs Abs: 2.9 10*3/uL (ref 0.7–4.0)
MCH: 32.8 pg (ref 26.0–34.0)
MCHC: 33.7 g/dL (ref 30.0–36.0)
MCV: 97.5 fL (ref 80.0–100.0)
Monocytes Absolute: 0.8 10*3/uL (ref 0.1–1.0)
Monocytes Relative: 10 %
Neutro Abs: 3.5 10*3/uL (ref 1.7–7.7)
Neutrophils Relative %: 46 %
Platelets: 371 10*3/uL (ref 150–400)
RBC: 3.23 MIL/uL — ABNORMAL LOW (ref 4.22–5.81)
RDW: 20 % — ABNORMAL HIGH (ref 11.5–15.5)
WBC: 7.6 10*3/uL (ref 4.0–10.5)
nRBC: 0 % (ref 0.0–0.2)

## 2022-09-17 LAB — BPAM RBC
Blood Product Expiration Date: 202406212359
Blood Product Expiration Date: 202406222359
ISSUE DATE / TIME: 202405301417
ISSUE DATE / TIME: 202405301747
Unit Type and Rh: 6200
Unit Type and Rh: 6200

## 2022-09-17 LAB — RETICULOCYTES
Immature Retic Fract: 28.5 % — ABNORMAL HIGH (ref 2.3–15.9)
RBC.: 3.21 MIL/uL — ABNORMAL LOW (ref 4.22–5.81)
Retic Count, Absolute: 89.2 10*3/uL (ref 19.0–186.0)
Retic Ct Pct: 2.8 % (ref 0.4–3.1)

## 2022-09-17 LAB — TYPE AND SCREEN
Donor AG Type: NEGATIVE
Unit division: 0
Unit division: 0

## 2022-09-17 LAB — BASIC METABOLIC PANEL
Anion gap: 9 (ref 5–15)
BUN: 45 mg/dL — ABNORMAL HIGH (ref 8–23)
CO2: 23 mmol/L (ref 22–32)
Calcium: 8.5 mg/dL — ABNORMAL LOW (ref 8.9–10.3)
Chloride: 101 mmol/L (ref 98–111)
Creatinine, Ser: 1.14 mg/dL (ref 0.61–1.24)
GFR, Estimated: >60 mL/min (ref >60)
Glucose, Bld: 90 mg/dL (ref 70–99)
Potassium: 4.4 mmol/L (ref 3.5–5.1)
Sodium: 133 mmol/L — ABNORMAL LOW (ref 135–145)

## 2022-09-17 LAB — FERRITIN: Ferritin: 390 ng/mL — ABNORMAL HIGH (ref 24–336)

## 2022-09-17 MED ORDER — SODIUM CHLORIDE 0.9 % IV SOLN: INTRAVENOUS | Status: AC

## 2022-09-17 MED ORDER — BISACODYL 10 MG RE SUPP
10.0000 mg | Freq: Every day | RECTAL | Status: DC
  Administered 2022-09-17 – 2022-09-20 (×3): 10 mg via RECTAL
  Filled 2022-09-17 (×4): qty 1

## 2022-09-17 NOTE — Progress Notes (Signed)
PROGRESS NOTE   Subjective/Complaints:  Pt using condom catheter- going better.  Needs someone to help him get comfortable in bed.  Had many BM's was disimpacted last evening x2.   ROS:  Pt denies SOB, abd pain, CP, N/V/C/D, and vision changes   Except for HPI Objective:   DG Abd Portable 1V  Result Date: 09/16/2022 CLINICAL DATA:  Evaluate for fecal impaction EXAM: PORTABLE ABDOMEN - 1 VIEW COMPARISON:  None Available. FINDINGS: Scattered large and small bowel gas is noted. No free air is seen. No obstructive changes are noted. Mild retained fecal material is noted within the rectum which may represent early fecal impaction. Right hip replacement is noted. Degenerative changes of the left hip and lumbar spine are seen. IMPRESSION: Mild rectal impaction.  No obstructive changes are seen. Electronically Signed   By: Alcide Clever M.D.   On: 09/16/2022 19:01   Recent Labs    09/16/22 0553 09/17/22 0626  WBC 7.1 7.6  HGB 7.1* 10.6*  HCT 22.1* 31.5*  PLT 345 371    Recent Labs    09/16/22 0553 09/17/22 0626  NA 131* 133*  K 4.8 4.4  CL 100 101  CO2 24 23  GLUCOSE 91 90  BUN 38* 45*  CREATININE 1.07 1.14  CALCIUM 8.3* 8.5*    Intake/Output Summary (Last 24 hours) at 09/17/2022 0828 Last data filed at 09/17/2022 0753 Gross per 24 hour  Intake 1713 ml  Output 2375 ml  Net -662 ml     Pressure Injury 08/25/22 Heel Left Deep Tissue Pressure Injury - Purple or maroon localized area of discolored intact skin or blood-filled blister due to damage of underlying soft tissue from pressure and/or shear. 6.5cm x 5.5cm (Active)  08/25/22 0730  Location: Heel  Location Orientation: Left  Staging: Deep Tissue Pressure Injury - Purple or maroon localized area of discolored intact skin or blood-filled blister due to damage of underlying soft tissue from pressure and/or shear.  Wound Description (Comments): 6.5cm x 5.5cm   Present on Admission: Yes     Pressure Injury 08/25/22 Buttocks Right Stage 2 -  Partial thickness loss of dermis presenting as a shallow open injury with a red, pink wound bed without slough. (Active)  08/25/22   Location: Buttocks  Location Orientation: Right  Staging: Stage 2 -  Partial thickness loss of dermis presenting as a shallow open injury with a red, pink wound bed without slough.  Wound Description (Comments):   Present on Admission: Yes     Pressure Injury 09/10/22 Sacrum Medial Stage 2 -  Partial thickness loss of dermis presenting as a shallow open injury with a red, pink wound bed without slough. Elongated area over sacrum (Active)  09/10/22 1912  Location: Sacrum  Location Orientation: Medial  Staging: Stage 2 -  Partial thickness loss of dermis presenting as a shallow open injury with a red, pink wound bed without slough.  Wound Description (Comments): Elongated area over sacrum  Present on Admission: Yes    Physical Exam: Vital Signs Blood pressure (!) 125/52, pulse (!) 55, temperature 97.6 F (36.4 C), temperature source Oral, resp. rate 16, height 5\' 7"  (1.702 m), weight 84.7  kg, SpO2 100 %.        General: awake, alert, appropriate, very HOH,- sitting up in bed; NAD HENT: conjugate gaze; oropharynx moist CV: irregular rhythm, bradycardic  rate; no JVD Pulmonary: CTA B/L; no W/R/R- good air movement GI: soft, NT, ND- much less protuberant, (+)BS- more normoactive Psychiatric: appropriate- a little flat Neurological: Ox3  Skin: VAC off- dressing C/D/I- less LE edema  Extremities: 1-2+ LE edema- worse on RLE- causing too much tightness like a tourniquet around R ankle due to swelling  Neurologic: Cranial nerves II through XII intact, motor strength is 5/5 in bilateral deltoid, bicep, tricep, grip, 3 - bilateral hip flexor, 4/5 knee extensors, ankle dorsiflexor and plantar flexor Sensory exam normal sensation to light touchin bilateral lower  extremities  Musculoskeletal: Full range of motion in all 4 extremities. No joint swelling   Assessment/Plan: 1. Functional deficits which require 3+ hours per day of interdisciplinary therapy in a comprehensive inpatient rehab setting. Physiatrist is providing close team supervision and 24 hour management of active medical problems listed below. Physiatrist and rehab team continue to assess barriers to discharge/monitor patient progress toward functional and medical goals  Care Tool:  Bathing    Body parts bathed by patient: Right arm, Left arm, Chest, Abdomen, Front perineal area, Right upper leg, Left upper leg, Face   Body parts bathed by helper: Buttocks, Left lower leg, Right lower leg     Bathing assist Assist Level: Maximal Assistance - Patient 24 - 49%     Upper Body Dressing/Undressing Upper body dressing   What is the patient wearing?: Pull over shirt    Upper body assist Assist Level: Moderate Assistance - Patient 50 - 74%    Lower Body Dressing/Undressing Lower body dressing      What is the patient wearing?: Pants, Incontinence brief     Lower body assist Assist for lower body dressing: Dependent - Patient 0%     Toileting Toileting    Toileting assist Assist for toileting: 2 Helpers     Transfers Chair/bed transfer  Transfers assist     Chair/bed transfer assist level: 2 Helpers     Locomotion Ambulation   Ambulation assist   Ambulation activity did not occur: Safety/medical concerns          Walk 10 feet activity   Assist  Walk 10 feet activity did not occur: Safety/medical concerns        Walk 50 feet activity   Assist Walk 50 feet with 2 turns activity did not occur: Safety/medical concerns         Walk 150 feet activity   Assist Walk 150 feet activity did not occur: Safety/medical concerns         Walk 10 feet on uneven surface  activity   Assist Walk 10 feet on uneven surfaces activity did not occur:  Safety/medical concerns         Wheelchair     Assist Is the patient using a wheelchair?: Yes (Pt left in w/c but did not attempt propulsion or dependent transport.) Type of Wheelchair: Manual           Wheelchair 50 feet with 2 turns activity    Assist    Wheelchair 50 feet with 2 turns activity did not occur: Safety/medical concerns       Wheelchair 150 feet activity     Assist  Wheelchair 150 feet activity did not occur: Safety/medical concerns       Blood pressure Marland Kitchen)  125/52, pulse (!) 55, temperature 97.6 F (36.4 C), temperature source Oral, resp. rate 16, height 5\' 7"  (1.702 m), weight 84.7 kg, SpO2 100 %.  Medical Problem List and Plan: 1. Functional deficits secondary to debility related to sepsis from Right 5th MT osteomyelitis and other medical complications. Pt is s/p right 5th ray amputation 5/22.             -TDWB RLE             -patient may not yet shower due to [facility]             -ELOS/Goals: 21-24 days,  Pt's goals mod A- daughter cannot provide this- looking at possible SNF Con't CIR PT and OT- pt doesn't want SNF, so daughter trying to see if can take home?  2.  Antithrombotics: -DVT/anticoagulation:  Pharmaceutical: Eliquis             -antiplatelet therapy: ASA 81mg  daily 3. Pain Management:  Oxycodone prn. 5/27- has very little pain- mainly stinging- con't mainly tylenol prn  4. Mood/Behavior/Sleep: LCSW to follow for evaluation and support.              -antipsychotic agents: N/A             -Melatonin prn for insomnia.  5. Neuropsych/cognition: This patient is capable of making decisions on his own behalf. 6. Skin/Wound Care: Continue wound VAC for now.              -pressure relief and foam dressings to left heel, buttocks wounds. Medihoney with gauze   under foam dressing for left heel..  5/27- will need to check when [facility] can be removed on R foot. - Dr Lajoyce Corners is surgeon  5/28- will d/w Rinaldo Cloud to see if can check when can be  removed- surgery was 5/22- so been 6 days?  5/29- can get removed 5/29- 5/31- VAC removed yesterday  7. Fluids/Electrolytes/Nutrition: Strict I/O w/daily weights             --Add  boost to help w/protein stores and promote healing.              --On zinc and Vitamin C 8. Bacteremia with osteomyelitis: Completed IV antibiotics and s/p Ray amputation             --Mop tx with Augmentin/doxycycline through June 5 9. Hyponatremia/pre-renal azotemia: Na improving form 131-->134. BUN with rise to 32.             --encourage fluid intake and should improve off Lasix.  --hyponatremia resolved off HCTZ. 5/27- BUN still 29- if rises above 30, will give IVFs- due to hx of fluid overload, will not add IVFs before then 5/28- will recheck Thursday 5/30- BUN up to 38- but Weight up 4 kg- will try giving Lasix due to fluid overload- in tissues-  and if not better, will call IM 5/31- Bun 45- and Cr 1.14- will need some IVFs, and hopefully won't third space- will give 75cc/hour x 12 hours and recheck in AM 10. ABLA/B12 deficiency: Continue to monitor. Has high ferritin level with MCV 104. -daily B12 injections until discharge from rehab -transfuse for hgb <7 5/27- Hb stable at 7.7- will monitor trends  5/31- Transfused yesterday for Hb of 7.1- called GI- fecal occult was negative x2- so per GI, not need for Endoscopy- 11. Vitamin B 12 deficiency: Vitam in B 12< 50 with recheck after supplementation > 7,500 12. Fluid overload: Daily wts with strict  I/O. Monitor for signs of overload.  --has been on IV lasix 20 mg since 05/08 w/recs to change to to prn. -will change lasix to 20mg  daily prn and monitor weights daily.  --will add low salt diet.  5/27- will add Daily weights- and monitor trend 5/29- d/w nursing about getting pt weighed again.  5/30- weight up 4 kg- wil give Lasix 40 mg x1 and monitor weight if needs to give more tomorrow.  5/31- weight down to 84.7 kg today- down 5 kg- and now dry- will give  12 hours of 75cc/hour of IVFs 13. Prostate cancer             -monitor voiding patterns             -flomax             -f/u as outpt with Dr. Retta Diones  5/28- s/p radiation for prostate CA- likely cause for Incontinence 14. New onset A fib:             -rate is controlled             -transitioned to eliquis by cardiology, continues on ASA as well  5/27- HR in 50s-60s- sounds like still in some Afib- con't regimen  5/29- in/out of afib- rate controlled- con't regimen  15. Urinary incontinence  5/27- will stop Purewick for now- put urinal where pt could reach- if has a lot of incontinence due to prostate CA- might need purewick at night- will see trends. 5/28- having urinary incontinence per pt- had multiple rounds of radiation for prostate CA- likely cause for urinary incontinence-  will see if can do purewick at night- and d/w team   5/29- d/w nursing- can do condom cath, however pt really wants to try urinal- so will not place condom cath at this time. 5/30- condom catheter placed  16. Constipation  5/29- will give sorbitol 30cc and if doesn't work, will give soap suds enema  5/30- LBM last night after soap suds enema- large  5/31- was disimpacted of 2 extra large Bms and continued to have BM's- Abdomen much less distended- feels better   I spent a total of  52  minutes on total care today- >50% coordination of care- due to fecal occult (-) needs IVFs- also d/w nursing about bowels last night and review of GI notes and labs.     LOS: 7 days A FACE TO FACE EVALUATION WAS PERFORMED  Laurel Harnden 09/17/2022, 8:28 AM

## 2022-09-17 NOTE — Progress Notes (Signed)
[facility] Progress Note  CC:  Anemia on Eliquis         Subjective: He is sitting up at the bedside undergoing physical therapy at this time.  Patient passed a moderate amount of dark brown stool after receiving a Duca locks suppository with manual disimpaction by his LPN yesterday evening.  He passed 1 or 2 smaller stools this morning.  No bloody or black stools.   Objective:  Vital signs in last 24 hours: Temp:  [97.5 F (36.4 C)-98.8 F (37.1 C)] 97.6 F (36.4 C) (05/31 0409) Pulse Rate:  [45-59] 55 (05/31 0409) Resp:  [15-21] 16 (05/31 0409) BP: (111-173)/(40-62) 125/52 (05/31 0409) SpO2:  [99 %-100 %] 100 % (05/31 0409) Weight:  [84.7 kg] 84.7 kg (05/31 0409) Last BM Date : 09/16/22 General: 89 year old male sitting at the bedside in no acute distress.  Hard of hearing. Heart: Irregular rhythm, no murmurs. Pulm: Breath sounds clear throughout. Abdomen: Protuberant, soft.  Nontender.  Positive bowel sounds all 4 quadrants.   Extremities: Bilateral lower extremity edema. Neurologic:  Alert and  oriented x 4.  Moves all extremities.  Speech is clear. Psych:  Alert and cooperative. Normal mood and affect.  Intake/Output from previous day: 05/30 0701 - 05/31 0700 In: 2130 [P.O.:1231; I.V.:7; Blood:892] Out: 2375 [Urine:2375] Intake/Output this shift: Total I/O In: 358 [P.O.:358] Out: 350 [Urine:350]  Lab Results: Recent Labs    09/16/22 0553 09/17/22 0626  WBC 7.1 7.6  HGB 7.1* 10.6*  HCT 22.1* 31.5*  PLT 345 371   BMET Recent Labs    09/16/22 0553 09/17/22 0626  NA 131* 133*  K 4.8 4.4  CL 100 101  CO2 24 23  GLUCOSE 91 90  BUN 38* 45*  CREATININE 1.07 1.14  CALCIUM 8.3* 8.5*   LFT Recent Labs    09/16/22 0553  PROT 5.0*  ALBUMIN 2.5*  AST 39  ALT 37  ALKPHOS 54  BILITOT 0.7   PT/INR No results for input(s): "LABPROT", "INR" in the last 72 hours. Hepatitis Panel No results for input(s): "HEPBSAG", "HCVAB", "HEPAIGM",  "HEPBIGM" in the last 72 hours.  DG Abd Portable 1V  Result Date: 09/16/2022 CLINICAL DATA:  Evaluate for fecal impaction EXAM: PORTABLE ABDOMEN - 1 VIEW COMPARISON:  None Available. FINDINGS: Scattered large and small bowel gas is noted. No free air is seen. No obstructive changes are noted. Mild retained fecal material is noted within the rectum which may represent early fecal impaction. Right hip replacement is noted. Degenerative changes of the left hip and lumbar spine are seen. IMPRESSION: Mild rectal impaction.  No obstructive changes are seen. Electronically Signed   By: Alcide Clever M.D.   On: 09/16/2022 19:01    Assessment / Plan:  Acute on chronic anemia, no overt GI bleeding Slowly downtrending since hospitalization for osteomyelitis/bacteremia On Eliquis for A-fib started 5/23, last dose 5/30 in the morning Admitting hemoglobin 5/7 HGB 11.5 has slowly drifted from 9-8 -> 7.1 ->transfused 2 units of PRBCs on 5/30 ->  today Hg 10.6. Normal iron levels. Ferritin 390. No overt GI bleeding, negative fecal occult blood at bedside. Repeat FOBT today was negative. On PPI po bid Previous colonoscopy potentially 2006, results unknown  Patient with history of constipation, CT 5/7 showed stool in distal rectum with distention 6.7 cm with fecal impaction, no obvious masses or lymphadenopathy No plans fore endoscopic evaluation at this time, await further recommendations per Dr. Meridee Score   Constipation with fecal impaction  and distal rectum distention 6.7 cm -Continue Colace 100 mg daily and Senokot twice daily. - Dulcolax suppository Q HS PRN  A-fib On Eliquis since 5/25 transition from heparin Last dose 5/30 in the morning   Bacteremia/osteomyelitis On Augmentin po, afebrile Status post right MT amputation 08/2018      Principal Problem:   Debility Active Problems:   Atrial fibrillation (HCC)   Anemia   Constipation     LOS: 7 days   [name]  09/17/2022,  2:08PM

## 2022-09-17 NOTE — Progress Notes (Signed)
Physical Therapy Session Note  Patient Details  Name: Jesse UELAND Sr. MRN: [id number] Date of Birth: 1928/07/03  Today's Date: 09/17/2022 PT Individual Time: 1013-1106 PT Individual Time Calculation (min): 53 min   Short Term Goals: Week 1:  PT Short Term Goal 1 (Week 1): Pt will tolerate sitting up between sessions. PT Short Term Goal 2 (Week 1): Pt will perform STS with + 1 assist PT Short Term Goal 3 (Week 1): Pt will ambulate >5 ft with LRAD  Skilled Therapeutic Interventions/Progress Updates: Pt presented in w/c with son and DIL present agreeable to therapy. Pt denies pain at rest. Noted wound vac removed, [place] offloading shoe still present. Pt propelled to main gym with supervision and without rest break this occurrence. Pt provided with extended rest break once in gym. Performed Stedy transfer to high/low mat with modA x 1. At elevated mat (22in) pt performed Sit to stand with RW and using RUE to push up from mat. Pt required modA and facilitation for rocking as well as facilitation for increased anterior weight shifting. Pt was able to complete x 3 stands with heavy min to modA. On 3rd stand pt noted to possibly have BM. On 4th stand (mod A) brief checked with positive incontinent BM. Pt returned to mat and set up with [facility] transfer to return to w/c. Performed stand with Stedy with minA. Once in w/c pt transported back to room for time management and performed Stedy transfer to toilet. Pt able to empty bowels and with +2 assist pt was able to stand (required modA for stand, second person for peri-care) for approx 2 min while peri-care completed. With fatigue pt  noted to have increased L lateral lean but could be corrected with verbal cues. Pt then transported to bed and was modA for stand from stedy to sit EOB and modA for sit to supine. Pt then instructed to use bed rails and was minA to scoot to [facility]. Pt repositioned to comfort and left with bed alarm on, call bell within reach and needs  met.      Therapy Documentation Precautions:  Precautions Precautions: Fall Precaution Comments: L heel, R buttocks pressure injuries; HoH Required Braces or Orthoses: Other Brace Other Brace: R post op shoe Restrictions Weight Bearing Restrictions: Yes RLE Weight Bearing: Touchdown weight bearing Other Position/Activity Restrictions: HOH General:   Vital Signs:    Therapy/Group: Individual Therapy  Liann Spaeth 09/17/2022, 12:57 PM

## 2022-09-17 NOTE — Progress Notes (Signed)
Physical Therapy Wound Evaluation and Discharge Patient Details  Name: [name]. MRN: [id number] Date of Birth: 09/26/1928  Today's Date: 09/17/2022 PT Individual Time:  -      Patient Active Problem List   Diagnosis Date Noted   Anemia 09/16/2022   Constipation 09/16/2022   Debility 09/10/2022   PVD (peripheral vascular disease) (HCC) 09/01/2022   Osteomyelitis of fifth toe of right foot (HCC) 08/31/2022   Bacteremia 08/31/2022   Acute cystitis without hematuria 08/31/2022   Polymicrobial bacterial infection 08/26/2022   Fever of undetermined origin 08/25/2022   Lactic acidosis 08/25/2022   Generalized weakness 08/25/2022   Bronchitis 08/25/2022   Bilateral pleural effusion 08/25/2022   Right foot ulcer (HCC) 08/25/2022   Essential hypertension 08/25/2022   BPH (benign prostatic hyperplasia) 08/25/2022   Atrial fibrillation (HCC) 08/25/2022   Past Medical History:  Diagnosis Date   Hypertension    Kidney calculus 2014;2013   Pneumonia    Prostate cancer (HCC)    Shortness of breath    Past Surgical History:  Procedure Laterality Date   ABDOMINAL AORTOGRAM W/LOWER EXTREMITY N/A 09/02/2022   Procedure: ABDOMINAL AORTOGRAM W/LOWER EXTREMITY;  Surgeon: Victorino Sparrow, MD;  Location: [facility] INVASIVE CV LAB;  Service: Vascular;  Laterality: N/A;   AMPUTATION Right 09/08/2022   Procedure: RIGHT FOOT FIFTH RAY AMPUTATION;  Surgeon: [name], MD;  Location: [facility] OR;  Service: Orthopedics;  Laterality: Right;   CATARACT EXTRACTION W/PHACO Right 02/13/2013   Procedure: CATARACT EXTRACTION RIGHT EYE (WITH PHACO) AND INTRAOCULAR LENS PLACEMENT  CDE=14.53;  Surgeon: Loraine Leriche T. Nile Riggs, MD;  Location: [facility] ORS;  Service: Ophthalmology;  Laterality: Right;   CATARACT EXTRACTION W/PHACO Left 02/27/2013   Procedure: CATARACT EXTRACTION PHACO AND INTRAOCULAR LENS PLACEMENT (IOC);  Surgeon: Loraine Leriche T. Nile Riggs, MD;  Location: [facility] ORS;  Service: Ophthalmology;  Laterality: Left;  CDE:12.54    COLONOSCOPY     JOINT REPLACEMENT Right 1993;1980   Right x2    Subjective  Subjective: Pt pleasant and agreeable to wound therapy evaluation. Patient and Family Stated Goals: none stated Date of Onset:  (Unsure) Prior Treatments: Dressing changes  Pain Score: Pt reported minimal pain throughout session.   Objective  Cognition: WFL Communication: Impaired:  Mobility: Impaired:  ROM: Impaired:  Sensation: not tested Protective sensation (10g): not tested Signs of venous insufficiency:N/A Signs of arterial insufficiency: N/A  Wound Assessment  Pressure Injury 09/10/22 Sacrum Medial Stage 2 -  Partial thickness loss of dermis presenting as a shallow open injury with a red, pink wound bed without slough. Elongated area over sacrum (Active)  Wound Image   09/17/22 1223  Dressing Type Foam - Lift dressing to assess site every shift;Gauze (Comment);Barrier Film (skin prep);Moist to moist 09/17/22 1223  Dressing Clean, Dry, Intact;Changed 09/17/22 1223  Dressing Change Frequency PRN 09/17/22 1223  State of Healing Early/partial granulation 09/17/22 1223  Site / Wound Assessment Yellow;Pink 09/17/22 1223  % Wound base Red or Granulating 30% 09/17/22 1223  % Wound base Yellow/Fibrinous Exudate 70% 09/17/22 1223  % Wound base Black/Eschar 0% 09/17/22 1223  % Wound base Other/Granulation Tissue (Comment) 0% 09/17/22 1223  Peri-wound Assessment Intact 09/17/22 1223  Wound Length (cm) 6 cm 09/17/22 1223  Wound Width (cm) 4 cm 09/17/22 1223  Wound Depth (cm) 0.1 cm 09/17/22 1223  Wound Surface Area (cm^2) 24 cm^2 09/17/22 1223  Wound Volume (cm^3) 2.4 cm^3 09/17/22 1223  Tunneling (cm) 0 09/17/22 1223  Undermining (cm) 0 09/17/22 1223  Margins  Unattached edges (unapproximated) 09/17/22 1223  Drainage Amount Scant 09/17/22 1223  Drainage Description No odor 09/17/22 1223  Treatment Irrigation;Packing (Saline gauze) 09/17/22 1223          Wound Assessment and Plan  Wound Therapy -  Assess/Plan/Recommendations Wound Therapy - Clinical Statement: Pt presents to wound therapy with a pressure injury to the sacrum. Wound evaluated and not appropriate for debridement at this time. Wound was dressed with saline moistened gauze and a sacral foam on top. Recommend air mattress to decrease risk for further skin breakdown. Wound therapy will not follow at this time, if needs change, please reconsult. Wound Therapy - Follow Up Recommendations: dressing changes by RN  Wound Therapy Goals- Improve the function of patient's integumentary system by progressing the wound(s) through the phases of wound healing (inflammation - proliferation - remodeling) by:    Goals will be updated until maximal potential achieved or discharge criteria met.  Discharge criteria: when goals achieved, discharge from hospital, MD decision/surgical intervention, no progress towards goals, refusal/missing three consecutive treatments without notification or medical reason.  [name] 09/17/2022, 12:43 PM  Conni Slipper, PT, DPT Acute Rehabilitation Services Secure Chat Preferred Office: [phone]

## 2022-09-17 NOTE — Progress Notes (Signed)
Occupational Therapy Session Note  Patient Details  Name: Jesse MCDAVID Sr. MRN: [id number] Date of Birth: [birth year]  Today's Date: 09/17/2022 OT Individual Time: 7829-5621 OT Individual Time Calculation (min): 72 min    Short Term Goals: Week 2:  OT Short Term Goal 1 (Week 2): Patient will flex forward to reach to feet while seated in wheelchair with min assist OT Short Term Goal 2 (Week 2): Patient will transition from an elevated surface sitting to partial standing (as needed for dressing) with max assist OT Short Term Goal 3 (Week 2): In preparation for toileting, patient will complete squat pivot transfer with max assist OT Short Term Goal 4 (Week 2): Patient will don/doff upper body clothing with set up assistance and increased time.  Skilled Therapeutic Interventions/Progress Updates:    OT intervention with focus on bed mobility, sit<>stand with Stedy, LB dressing sit<>stand from EOB, UB dressing in w/c, w/c mobility, and BUE strengthening. Supine>sit EOB with mod A. Pt able to scoot to EOB with supervsion. Tot A for threading pants and pulling over hips while standing in [place]. Sit<>stand in [place] with mod A. UB bathing with supervision. UB dressing with mod A. Pt propelled w/c to day room with supervision. BUE therex with 5# bar-circuit exercises pushing up and out 3x10 with rest breaks. Pt returned to room and remained in w/c. Belt alarm activated. RN present.   Therapy Documentation Precautions:  Precautions Precautions: Fall Precaution Comments: L heel, R buttocks pressure injuries; HoH Required Braces or Orthoses: Other Brace Other Brace: R post op shoe Restrictions Weight Bearing Restrictions: Yes RLE Weight Bearing: Touchdown weight bearing Other Position/Activity Restrictions: HOH   Pain:  Pt denies pain this morning   Therapy/Group: Individual Therapy  [name] 09/17/2022, 9:35 AM

## 2022-09-17 NOTE — Progress Notes (Signed)
Physical Therapy Session Note  Patient Details  Name: Jesse ROUGH Sr. MRN: [id number] Date of Birth: 08-26-1928  Today's Date: 09/17/2022 PT Individual Time: 1300-1415 PT Individual Time Calculation (min): 75 min   Short Term Goals: Week 1:  PT Short Term Goal 1 (Week 1): Pt will tolerate sitting up between sessions. PT Short Term Goal 2 (Week 1): Pt will perform STS with + 1 assist PT Short Term Goal 3 (Week 1): Pt will ambulate >5 ft with LRAD  Skilled Therapeutic Interventions/Progress Updates:    pt received in bed and agreeable to therapy. Pt reports intermittent discomfort at an old femoral cath site (per pt verbal report, though a bit unclear). Padded with washcloth for comfort to prevent skin friction, rest and positioning as needed.  Pt prefers in room therapy d/t recent episode of bowel incontinence and urgency. Supine>sit with mod A for BLE and HHA to pull into sitting. Session focused on Sit to stand in stedy from elevated bed, 4 x 5. Largely mod A from elevated surface, but up to max with fatigue. Assist and cueing for weight bearing precaution and hip extension/anterior weight shift. Pt seemed fatigued  from many BM's this date and with mild DOE noted during activity, required extended rest breaks to manage. Attempted lifting RLE in stedy, but insufficient UE support to fully clear foot, limited by heavy posterior lean. GI NP in/out for consult. Pt then performed sit ups from recliner position on pillows to anterior reach to facilitate improved anterior weight shift during transfers. Pt able to lean anteriorly without difficulty, but no carryover when pt was instructed to use same position to scoot up bed. Pt able to pull self up in bed with assist for position and trendelenburg. Pt remained in bed after session and was left with all needs in reach and alarm active.   Therapy Documentation Precautions:  Precautions Precautions: Fall Precaution Comments: L heel, R buttocks pressure  injuries; HoH Required Braces or Orthoses: Other Brace Other Brace: R post op shoe Restrictions Weight Bearing Restrictions: Yes RLE Weight Bearing: Touchdown weight bearing Other Position/Activity Restrictions: HOH General:   Vital Signs:  Pain: Pain Assessment Pain Scale: 0-10 Pain Score: 0-No pain Faces Pain Scale: No hurt Mobility:   Locomotion :    Trunk/Postural Assessment :    Balance:   Exercises:   Other Treatments:      Therapy/Group: Individual Therapy  [name] 09/17/2022, 1:33 PM

## 2022-09-17 NOTE — Progress Notes (Signed)
Occupational Therapy Weekly Progress Note  Patient Details  Name: Jesse RUGAMA Sr. MRN: [id number] Date of Birth: Sep 02, 1928  Beginning of progress report period: Sep 11, 2022 End of progress report period: Sep 17, 2022  Patient has met 0 of 4 short term goals.  Pt progress with BADLs and functional tranfsers has been slow since admission. Improvements in bed mobility and UB bathing/dressing have been noted. Supine>sit EOB with mod A using bed rails and HOB elevated. Sitting balance with supervision and pt able to scoot to EOB once seated EOB. Sit<>stand with Stedy and mod A. Bathing with sit<>stand from w/c with mod A. UB dressing with mod A and LB dressing with tot A. Standing balance with min A in [place]. Pt fatigues quickly and requires multiple rest breaks. Wound vac removed 5/30 which should facilitate improvement with transfers.   Patient continues to demonstrate the following deficits: muscle weakness, decreased cardiorespiratoy endurance, and decreased sitting balance, decreased standing balance, decreased balance strategies, and difficulty maintaining precautions and therefore will continue to benefit from skilled OT intervention to enhance overall performance with BADL and Reduce care partner burden.  Patient progressing toward long term goals..  Continue plan of care.  OT Short Term Goals Week 1:  OT Short Term Goal 1 (Week 1): Patient will flex forward to reach to feet while seated in wheelchair with min assist OT Short Term Goal 1 - Progress (Week 1): Progressing toward goal OT Short Term Goal 2 (Week 1): Patient will transition from an elevated surface sitting to partial standing (as needed for dressing) with max assist OT Short Term Goal 2 - Progress (Week 1): Progressing toward goal OT Short Term Goal 3 (Week 1): In preparation for toileting, patient will complete squat pivot transfer with max assist OT Short Term Goal 3 - Progress (Week 1): Progressing toward goal OT Short Term  Goal 4 (Week 1): Patient will don/doff upper body clothing with set up assistance and increased time. OT Short Term Goal 4 - Progress (Week 1): Progressing toward goal Week 2:  OT Short Term Goal 1 (Week 2): Patient will flex forward to reach to feet while seated in wheelchair with min assist OT Short Term Goal 2 (Week 2): Patient will transition from an elevated surface sitting to partial standing (as needed for dressing) with max assist OT Short Term Goal 3 (Week 2): In preparation for toileting, patient will complete squat pivot transfer with max assist OT Short Term Goal 4 (Week 2): Patient will don/doff upper body clothing with set up assistance and increased time.    Lavone Neri [facility] 09/17/2022, 6:59 AM

## 2022-09-17 NOTE — Progress Notes (Signed)
Patient endorisng sensation of having another bowel movement. Medium size type 5 BM. However, during peri care and ADLS continued to have BM, but having difficulty dislodging BM. Disimpaction needed again and medium type 5 fecal matter removed from patient's rectum area. Also, while performing ADLS cleaning patient pressure from cleaning aiding in dislodging fecal matter as well.

## 2022-09-18 DIAGNOSIS — R7989 Other specified abnormal findings of blood chemistry: Secondary | ICD-10-CM

## 2022-09-18 DIAGNOSIS — C61 Malignant neoplasm of prostate: Secondary | ICD-10-CM

## 2022-09-18 DIAGNOSIS — E875 Hyperkalemia: Secondary | ICD-10-CM

## 2022-09-18 LAB — CBC WITH DIFFERENTIAL/PLATELET
Abs Immature Granulocytes: 0.4 10*3/uL — ABNORMAL HIGH (ref 0.00–0.07)
Basophils Absolute: 0.1 10*3/uL (ref 0.0–0.1)
Basophils Relative: 1 %
Eosinophils Absolute: 0.1 10*3/uL (ref 0.0–0.5)
Eosinophils Relative: 1 %
HCT: 41.7 % (ref 39.0–52.0)
Hemoglobin: 13.6 g/dL (ref 13.0–17.0)
Immature Granulocytes: 5 %
Lymphocytes Relative: 18 %
Lymphs Abs: 1.4 10*3/uL (ref 0.7–4.0)
MCH: 34.3 pg — ABNORMAL HIGH (ref 26.0–34.0)
MCHC: 32.6 g/dL (ref 30.0–36.0)
MCV: 105.3 fL — ABNORMAL HIGH (ref 80.0–100.0)
Monocytes Absolute: 0.7 10*3/uL (ref 0.1–1.0)
Monocytes Relative: 8 %
Neutro Abs: 5.5 10*3/uL (ref 1.7–7.7)
Neutrophils Relative %: 67 %
Platelets: 137 10*3/uL — ABNORMAL LOW (ref 150–400)
RBC: 3.96 MIL/uL — ABNORMAL LOW (ref 4.22–5.81)
RDW: 17.8 % — ABNORMAL HIGH (ref 11.5–15.5)
WBC: 8.2 10*3/uL (ref 4.0–10.5)
nRBC: 0 % (ref 0.0–0.2)

## 2022-09-18 LAB — BASIC METABOLIC PANEL
Anion gap: 13 (ref 5–15)
BUN: 38 mg/dL — ABNORMAL HIGH (ref 8–23)
CO2: 22 mmol/L (ref 22–32)
Calcium: 9.1 mg/dL (ref 8.9–10.3)
Chloride: 101 mmol/L (ref 98–111)
Creatinine, Ser: 1.27 mg/dL — ABNORMAL HIGH (ref 0.61–1.24)
GFR, Estimated: 53 mL/min — ABNORMAL LOW (ref >60)
Glucose, Bld: 214 mg/dL — ABNORMAL HIGH (ref 70–99)
Potassium: 3.2 mmol/L — ABNORMAL LOW (ref 3.5–5.1)
Sodium: 136 mmol/L (ref 135–145)

## 2022-09-18 MED ORDER — POTASSIUM CHLORIDE IN NACL 20-0.9 MEQ/L-% IV SOLN
INTRAVENOUS | Status: DC
  Filled 2022-09-18 (×3): qty 1000

## 2022-09-18 MED ORDER — POTASSIUM CHLORIDE CRYS ER 20 MEQ PO TBCR
20.0000 meq | EXTENDED_RELEASE_TABLET | Freq: Every day | ORAL | Status: DC
  Administered 2022-09-18 – 2022-09-22 (×5): 20 meq via ORAL
  Filled 2022-09-18 (×5): qty 1

## 2022-09-18 NOTE — Progress Notes (Signed)
IP Rehab Bowel Program Documentation   Bowel Program Start time [phone]   Dig Stim Indicated? Yes  Dig Stim Prior to Suppository or mini Enema X 1   Output from dig stim: Minimal  Ordered intervention: Suppository Yes , mini enema No ,   Repeat dig stim after Suppository or Mini enema: X3 Output? Pt had a medium, hard BM after dig stimulation  Bowel Program Complete? Yes , handoff given Rozelle Logan  Patient Tolerated? Yes

## 2022-09-18 NOTE — Progress Notes (Signed)
PRN medication given for edema. Increase in output. Condom catheter leaking and increase in moisture in GU area. PureWick placed on patient. During bed change and aiding patient with ADLS observed increase swelling in lower legs. Increase in fluid in upper area of legs as well as where patient right hamstring was observed a large bubble of fluid. Teacher, English as a foreign language in to verify observation. Patient denies feeling short of breath able to observe with expirations being noisy and audible. Lungs sounds are normal with lower lobes diminished. No ascites assessed with abdomen. +3 Edema right and left lower extremities. Patient denies pain is observed to grimace with movement of lower extremities especially right ankle & foot. Also, observed while moving patient's right foot CN III twitching right cheek area of patient.

## 2022-09-18 NOTE — Progress Notes (Addendum)
PROGRESS NOTE   Subjective/Complaints:  Nurse reports 3+ edema in legs. Condom cath leaked per nurse note. Had two incontinent type 5 bm's yesterday. Pt says he's comfortable.   ROS: Patient denies fever, rash, sore throat, blurred vision, dizziness, nausea, vomiting, diarrhea, cough, shortness of breath or chest pain, headache, or mood change.    Objective:   DG Abd Portable 1V  Result Date: 09/16/2022 CLINICAL DATA:  Evaluate for fecal impaction EXAM: PORTABLE ABDOMEN - 1 VIEW COMPARISON:  None Available. FINDINGS: Scattered large and small bowel gas is noted. No free air is seen. No obstructive changes are noted. Mild retained fecal material is noted within the rectum which may represent early fecal impaction. Right hip replacement is noted. Degenerative changes of the left hip and lumbar spine are seen. IMPRESSION: Mild rectal impaction.  No obstructive changes are seen. Electronically Signed   By: Alcide Clever M.D.   On: 09/16/2022 19:01   Recent Labs    09/17/22 0626 09/18/22 0549  WBC 7.6 8.2  HGB 10.6* 13.6  HCT 31.5* 41.7  PLT 371 137*    Recent Labs    09/17/22 0626 09/18/22 0549  NA 133* 136  K 4.4 3.2*  CL 101 101  CO2 23 22  GLUCOSE 90 214*  BUN 45* 38*  CREATININE 1.14 1.27*  CALCIUM 8.5* 9.1    Intake/Output Summary (Last 24 hours) at 09/18/2022 1219 Last data filed at 09/18/2022 0818 Gross per 24 hour  Intake 1678.5 ml  Output 400 ml  Net 1278.5 ml     Pressure Injury 08/25/22 Heel Left Deep Tissue Pressure Injury - Purple or maroon localized area of discolored intact skin or blood-filled blister due to damage of underlying soft tissue from pressure and/or shear. 6.5cm x 5.5cm (Active)  08/25/22 0730  Location: Heel  Location Orientation: Left  Staging: Deep Tissue Pressure Injury - Purple or maroon localized area of discolored intact skin or blood-filled blister due to damage of underlying soft  tissue from pressure and/or shear.  Wound Description (Comments): 6.5cm x 5.5cm  Present on Admission: Yes     Pressure Injury 08/25/22 Buttocks Right Stage 2 -  Partial thickness loss of dermis presenting as a shallow open injury with a red, pink wound bed without slough. (Active)  08/25/22   Location: Buttocks  Location Orientation: Right  Staging: Stage 2 -  Partial thickness loss of dermis presenting as a shallow open injury with a red, pink wound bed without slough.  Wound Description (Comments):   Present on Admission: Yes     Pressure Injury 09/10/22 Sacrum Medial Stage 2 -  Partial thickness loss of dermis presenting as a shallow open injury with a red, pink wound bed without slough. Elongated area over sacrum (Active)  09/10/22 1912  Location: Sacrum  Location Orientation: Medial  Staging: Stage 2 -  Partial thickness loss of dermis presenting as a shallow open injury with a red, pink wound bed without slough.  Wound Description (Comments): Elongated area over sacrum  Present on Admission: Yes    Physical Exam: Vital Signs Blood pressure (!) 123/57, pulse (!) 54, temperature 98.3 F (36.8 C), temperature source Oral, resp. rate  17, height 5\' 7"  (1.702 m), weight 88.6 kg, SpO2 100 %.        Constitutional: No distress . Vital signs reviewed. HEENT: NCAT, EOMI, oral membranes moist Neck: supple Cardiovascular: RRR without murmur. No JVD    Respiratory/Chest: CTA Bilaterally without wheezes or rales. Normal effort    GI/Abdomen: BS +, non-tender, non-distended Ext: no clubbing, cyanosis. LE edema 1+ at best R>L Psych: pleasant and cooperative  Skin: VAC off- dressing C/D/I-     Neurologic: Fairly alert. Oriented to person, place, month/year. Cranial nerves II through XII intact, motor strength is 5/5 in bilateral deltoid, bicep, tricep, grip, 3 - bilateral hip flexor, 4/5 knee extensors, ankle dorsiflexor and plantar flexor Sensory exam normal sensation to light  touchin bilateral lower extremities  Musculoskeletal: Full range of motion in all 4 extremities. No joint swelling   Assessment/Plan: 1. Functional deficits which require 3+ hours per day of interdisciplinary therapy in a comprehensive inpatient rehab setting. Physiatrist is providing close team supervision and 24 hour management of active medical problems listed below. Physiatrist and rehab team continue to assess barriers to discharge/monitor patient progress toward functional and medical goals  Care Tool:  Bathing    Body parts bathed by patient: Right arm, Left arm, Chest, Abdomen, Front perineal area, Left upper leg, Right upper leg, Face   Body parts bathed by helper: Buttocks, Right lower leg, Left lower leg     Bathing assist Assist Level: Maximal Assistance - Patient 24 - 49%     Upper Body Dressing/Undressing Upper body dressing   What is the patient wearing?: Pull over shirt    Upper body assist Assist Level: Moderate Assistance - Patient 50 - 74%    Lower Body Dressing/Undressing Lower body dressing      What is the patient wearing?: Pants, Incontinence brief     Lower body assist Assist for lower body dressing: Dependent - Patient 0%     Toileting Toileting    Toileting assist Assist for toileting: 2 Helpers     Transfers Chair/bed transfer  Transfers assist     Chair/bed transfer assist level: 2 Helpers     Locomotion Ambulation   Ambulation assist   Ambulation activity did not occur: Safety/medical concerns          Walk 10 feet activity   Assist  Walk 10 feet activity did not occur: Safety/medical concerns        Walk 50 feet activity   Assist Walk 50 feet with 2 turns activity did not occur: Safety/medical concerns         Walk 150 feet activity   Assist Walk 150 feet activity did not occur: Safety/medical concerns         Walk 10 feet on uneven surface  activity   Assist Walk 10 feet on uneven surfaces  activity did not occur: Safety/medical concerns         Wheelchair     Assist Is the patient using a wheelchair?: Yes (Pt left in w/c but did not attempt propulsion or dependent transport.) Type of Wheelchair: Manual           Wheelchair 50 feet with 2 turns activity    Assist    Wheelchair 50 feet with 2 turns activity did not occur: Safety/medical concerns       Wheelchair 150 feet activity     Assist  Wheelchair 150 feet activity did not occur: Safety/medical concerns       Blood pressure Marland Kitchen)  123/57, pulse (!) 54, temperature 98.3 F (36.8 C), temperature source Oral, resp. rate 17, height 5\' 7"  (1.702 m), weight 88.6 kg, SpO2 100 %.  Medical Problem List and Plan: 1. Functional deficits secondary to debility related to sepsis from Right 5th MT osteomyelitis and other medical complications. Pt is s/p right 5th ray amputation 5/22.             -TDWB RLE             -patient may not yet shower due to [facility]             -ELOS/Goals: 21-24 days,  Pt's goals mod A- daughter cannot provide this- looking at possible SNF -Continue CIR therapies including PT, OT  pt doesn't want SNF, so daughter trying to see if can take home?  2.  Antithrombotics: -DVT/anticoagulation:  Pharmaceutical: Eliquis             -antiplatelet therapy: ASA 81mg  daily 3. Pain Management:  Oxycodone prn. 5/27- has very little pain- mainly stinging- con't mainly tylenol prn  4. Mood/Behavior/Sleep: LCSW to follow for evaluation and support.              -antipsychotic agents: N/A             -Melatonin prn for insomnia.  5. Neuropsych/cognition: This patient is capable of making decisions on his own behalf. 6. Skin/Wound Care: Continue wound VAC for now.              -pressure relief and foam dressings to left heel, buttocks wounds. Medihoney with gauze   under foam dressing for left heel..  5/27- will need to check when [facility] can be removed on R foot. - Dr Lajoyce Corners is surgeon  5/28- will d/w  Rinaldo Cloud to see if can check when can be removed- surgery was 5/22- so been 6 days?  5/29- can get removed 5/29- 6/1 vac off. Continue local dressings 7. Fluids/Electrolytes/Nutrition: Strict I/O w/daily weights             --Add  boost to help w/protein stores and promote healing.              --On zinc and Vitamin C 8. Bacteremia with osteomyelitis: Completed IV antibiotics and s/p Ray amputation             --Mop tx with Augmentin/doxycycline through June 5 9. Hyponatremia/pre-renal azotemia: Na improving form 131-->134. BUN with rise to 32.             --encourage fluid intake and should improve off Lasix.  --hyponatremia resolved off HCTZ. 5/27- BUN still 29- if rises above 30, will give IVFs- due to hx of fluid overload, will not add IVFs before then 5/28- will recheck Thursday 5/30- BUN up to 38- but Weight up 4 kg- will try giving Lasix due to fluid overload- in tissues-  and if not better, will call IM 5/31- Bun 45- and Cr 1.14- will need some IVFs, and hopefully won't third space- will give 75cc/hour x 12 hours and recheck in AM 6/1 BUN sl improved but Cr up to 1.27. Potassium now low  -nurse gave PRN lasix this morning after running ivf  -dc prn lasix dose    -resume IVF @50cc /hr  10. ABLA/B12 deficiency: Continue to monitor. Has high ferritin level with MCV 104. -daily B12 injections until discharge from rehab -transfuse for hgb <7 5/27- Hb stable at 7.7- will monitor trends  5/31- Transfused yesterday for Hb of 7.1-  called GI- fecal occult was negative x2- so per GI, not need for Endoscopy- 11. Vitamin B 12 deficiency: Vitam in B 12< 50 with recheck after supplementation > 7,500 12. Fluid overload: Daily wts with strict I/O. Monitor for signs of overload.  --has been on IV lasix 20 mg since 05/08 w/recs to change to to prn. -will change lasix to 20mg  daily prn and monitor weights daily.  --will add low salt diet.  5/27- will add Daily weights- and monitor trend 5/29- d/w  nursing about getting pt weighed again.  5/30- weight up 4 kg- wil give Lasix 40 mg x1 and monitor weight if needs to give more tomorrow.  5/31- weight down to 84.7 kg today- down 5 kg- and now dry- will give 12 hours of 75cc/hour of IVFs Filed Weights   09/17/22 0409 09/17/22 1752 09/18/22 0500  Weight: 84.7 kg 84.7 kg 88.6 kg   6/1 back to prior weight. Legs look ok. They have 1+ edema at best. No sob, pt feels well. Recent ECHO with 70% EF, Heart is in reg rhythm  -will run lower rate IVF  -recheck bmet, weights, etc tomorrow  -pt was advised to contact his nurse if he had any sob, cough, etc 13. Prostate cancer             -monitor voiding patterns             -flomax             -f/u as outpt with Dr. Retta Diones  5/28- s/p radiation for prostate CA- likely cause for Incontinence 14. New onset A fib:             -rate is controlled             -transitioned to eliquis by cardiology, continues on ASA as well  5/27- HR in 50s-60s- sounds like still in some Afib- con't regimen  5/29- in/out of afib- rate controlled- con't regimen  15. Urinary incontinence  5/27- will stop Purewick for now- put urinal where pt could reach- if has a lot of incontinence due to prostate CA- might need purewick at night- will see trends. 5/28- having urinary incontinence per pt- had multiple rounds of radiation for prostate CA- likely cause for urinary incontinence-  will see if can do purewick at night- and d/w team   5/29- d/w nursing- can do condom cath, however pt really wants to try urinal- so will not place condom cath at this time. 6/1 continue condom cath 16. Constipation  5/29- will give sorbitol 30cc and if doesn't work, will give soap suds enema  5/30- LBM last night after soap suds enema- large  5/31- was disimpacted of 2 extra large Bms and continued to have BM's- Abdomen much less distended- feels better  6/1 two more bm's yesterday type 5    Greater than 50 total minutes were spent in  examination of patient, assessment of pertinent data,  formulation of a treatment plan, and in discussion with patient, family, and/or treating providers.    LOS: 8 days A FACE TO FACE EVALUATION WAS PERFORMED  Ranelle Oyster 09/18/2022, 12:19 PM

## 2022-09-18 NOTE — Progress Notes (Signed)
Physical Therapy Session Note  Patient Details  Name: Jesse STROMGREN Sr. MRN: [id number] Date of Birth: [birth year]  Today's Date: 09/18/2022 PT Individual Time: 0804-0903 PT Individual Time Calculation (min): 59 min   Short Term Goals: Week 1:  PT Short Term Goal 1 (Week 1): Pt will tolerate sitting up between sessions. PT Short Term Goal 2 (Week 1): Pt will perform STS with + 1 assist PT Short Term Goal 3 (Week 1): Pt will ambulate >5 ft with LRAD  Skilled Therapeutic Interventions/Progress Updates: Patient supine in bed on entrance to room. Patient alert and agreeable to PT session.   Patient reported muscle twitching in L low back at beginning of session. Repositioning for therex EOB initiated to change patient's posture and to distract from pain. Patient repositioned at end of session to comfortable position with pillow a quarter underneath L hip, and B LE not as elevated as that decreased patient's musculature pain.  Therapeutic Activity: Bed Mobility: Pt performed supine<>sit on EOB with HOB elevated and required heavy modA to elevate trunk and to advance B LE off bed. VC required for use of B UE to grab onto hand rails on HOB to assist in rolling to L side, and to use R hand to push into bed to assist into sitting position. Patient required same assistance for trunk control when descending back to supine position, and maxA to elevate B LE onto bed. Patient continent of bladder in purwick 3 times throughout session.  Therapeutic Exercise: Pt performed the following exercises with therapist providing VC for eccentric control - LAQ B with 2.5lb ankle weight - B seated hip marches EOB 2 x 10 (AAROM with therapist provdiding min-modA to elevate knees) - B eccentric control and focus hip flexion with therapist passively moving patient into hip flexion in sitting position with cues to not let legs fall (eccentric control)  Patient supine in bed at end of session with brakes locked, bed alarm  set, and all needs within reach.      Therapy Documentation Precautions:  Precautions Precautions: Fall Precaution Comments: L heel, R buttocks pressure injuries; HoH Required Braces or Orthoses: Other Brace Other Brace: R post op shoe Restrictions Weight Bearing Restrictions: Yes RLE Weight Bearing: Touchdown weight bearing Other Position/Activity Restrictions: HOH  Therapy/Group: Individual Therapy  Alfreddie Consalvo PTA 09/18/2022, 12:47 PM

## 2022-09-19 DIAGNOSIS — N179 Acute kidney failure, unspecified: Secondary | ICD-10-CM

## 2022-09-19 DIAGNOSIS — I4891 Unspecified atrial fibrillation: Secondary | ICD-10-CM

## 2022-09-19 DIAGNOSIS — K5901 Slow transit constipation: Secondary | ICD-10-CM

## 2022-09-19 LAB — BASIC METABOLIC PANEL
Anion gap: 11 (ref 5–15)
BUN: 28 mg/dL — ABNORMAL HIGH (ref 8–23)
CO2: 19 mmol/L — ABNORMAL LOW (ref 22–32)
Calcium: 8.3 mg/dL — ABNORMAL LOW (ref 8.9–10.3)
Chloride: 105 mmol/L (ref 98–111)
Creatinine, Ser: 0.97 mg/dL (ref 0.61–1.24)
GFR, Estimated: >60 mL/min (ref >60)
Glucose, Bld: 149 mg/dL — ABNORMAL HIGH (ref 70–99)
Potassium: 4.2 mmol/L (ref 3.5–5.1)
Sodium: 135 mmol/L (ref 135–145)

## 2022-09-19 NOTE — Progress Notes (Signed)
PROGRESS NOTE   Subjective/Complaints:  Pt just finished breakfast. Reports right hip pain which can be relieved by repositioning in bed. OT about to start with him.   ROS: Patient denies fever, rash, sore throat, blurred vision, dizziness, nausea, vomiting, diarrhea, cough, shortness of breath or chest pain,  headache, or mood change.    Objective:   No results found. Recent Labs    09/17/22 0626 09/18/22 0549  WBC 7.6 8.2  HGB 10.6* 13.6  HCT 31.5* 41.7  PLT 371 137*    Recent Labs    09/18/22 0549 09/19/22 0813  NA 136 135  K 3.2* 4.2  CL 101 105  CO2 22 19*  GLUCOSE 214* 149*  BUN 38* 28*  CREATININE 1.27* 0.97  CALCIUM 9.1 8.3*    Intake/Output Summary (Last 24 hours) at 09/19/2022 1120 Last data filed at 09/19/2022 1610 Gross per 24 hour  Intake 639.58 ml  Output 1400 ml  Net -760.42 ml     Pressure Injury 08/25/22 Heel Left Deep Tissue Pressure Injury - Purple or maroon localized area of discolored intact skin or blood-filled blister due to damage of underlying soft tissue from pressure and/or shear. 6.5cm x 5.5cm (Active)  08/25/22 0730  Location: Heel  Location Orientation: Left  Staging: Deep Tissue Pressure Injury - Purple or maroon localized area of discolored intact skin or blood-filled blister due to damage of underlying soft tissue from pressure and/or shear.  Wound Description (Comments): 6.5cm x 5.5cm  Present on Admission: Yes     Pressure Injury 08/25/22 Buttocks Right Stage 2 -  Partial thickness loss of dermis presenting as a shallow open injury with a red, pink wound bed without slough. (Active)  08/25/22   Location: Buttocks  Location Orientation: Right  Staging: Stage 2 -  Partial thickness loss of dermis presenting as a shallow open injury with a red, pink wound bed without slough.  Wound Description (Comments):   Present on Admission: Yes     Pressure Injury 09/10/22 Sacrum  Medial Stage 2 -  Partial thickness loss of dermis presenting as a shallow open injury with a red, pink wound bed without slough. Elongated area over sacrum (Active)  09/10/22 1912  Location: Sacrum  Location Orientation: Medial  Staging: Stage 2 -  Partial thickness loss of dermis presenting as a shallow open injury with a red, pink wound bed without slough.  Wound Description (Comments): Elongated area over sacrum  Present on Admission: Yes    Physical Exam: Vital Signs Blood pressure (!) 121/48, pulse (!) 52, temperature 98.5 F (36.9 C), temperature source Oral, resp. rate 17, height 5\' 7"  (1.702 m), weight 90.1 kg, SpO2 99 %.        Constitutional: No distress . Vital signs reviewed. HEENT: NCAT, EOMI, oral membranes moist Neck: supple Cardiovascular: RRR without murmur. No JVD    Respiratory/Chest: CTA Bilaterally without wheezes or rales. Normal effort    GI/Abdomen: BS +, non-tender, non-distended Ext: no clubbing, cyanosis, LE edema 1+ R>L. Stable in appearance Psych: pleasant and cooperative  Skin: right foot- dressing C/D/I-     Neurologic: Fairly alert. Oriented to person, place, month/year. Cranial nerves II through XII  intact, motor strength is 5/5 in bilateral deltoid, bicep, tricep, grip, 3 - bilateral hip flexor, 4/5 knee extensors, ankle dorsiflexor and plantar flexor Sensory exam normal sensation to light touchin bilateral lower extremities  Musculoskeletal:   No joint swelling. Left hip a little tender with palpation laterally, PROM  Assessment/Plan: 1. Functional deficits which require 3+ hours per day of interdisciplinary therapy in a comprehensive inpatient rehab setting. Physiatrist is providing close team supervision and 24 hour management of active medical problems listed below. Physiatrist and rehab team continue to assess barriers to discharge/monitor patient progress toward functional and medical goals  Care Tool:  Bathing    Body parts bathed  by patient: Right arm, Left arm, Chest, Abdomen, Front perineal area, Left upper leg, Right upper leg, Face   Body parts bathed by helper: Buttocks, Right lower leg, Left lower leg     Bathing assist Assist Level: Maximal Assistance - Patient 24 - 49%     Upper Body Dressing/Undressing Upper body dressing   What is the patient wearing?: Pull over shirt    Upper body assist Assist Level: Moderate Assistance - Patient 50 - 74%    Lower Body Dressing/Undressing Lower body dressing      What is the patient wearing?: Pants, Incontinence brief     Lower body assist Assist for lower body dressing: Dependent - Patient 0%     Toileting Toileting    Toileting assist Assist for toileting: 2 Helpers     Transfers Chair/bed transfer  Transfers assist     Chair/bed transfer assist level: 2 Helpers     Locomotion Ambulation   Ambulation assist   Ambulation activity did not occur: Safety/medical concerns          Walk 10 feet activity   Assist  Walk 10 feet activity did not occur: Safety/medical concerns        Walk 50 feet activity   Assist Walk 50 feet with 2 turns activity did not occur: Safety/medical concerns         Walk 150 feet activity   Assist Walk 150 feet activity did not occur: Safety/medical concerns         Walk 10 feet on uneven surface  activity   Assist Walk 10 feet on uneven surfaces activity did not occur: Safety/medical concerns         Wheelchair     Assist Is the patient using a wheelchair?: Yes (Pt left in w/c but did not attempt propulsion or dependent transport.) Type of Wheelchair: Manual           Wheelchair 50 feet with 2 turns activity    Assist    Wheelchair 50 feet with 2 turns activity did not occur: Safety/medical concerns       Wheelchair 150 feet activity     Assist  Wheelchair 150 feet activity did not occur: Safety/medical concerns       Blood pressure (!) 121/48, pulse (!)  52, temperature 98.5 F (36.9 C), temperature source Oral, resp. rate 17, height 5\' 7"  (1.702 m), weight 90.1 kg, SpO2 99 %.  Medical Problem List and Plan: 1. Functional deficits secondary to debility related to sepsis from Right 5th MT osteomyelitis and other medical complications. Pt is s/p right 5th ray amputation 5/22.             -TDWB RLE             -patient may not yet shower due to [facility]             -  ELOS/Goals: 21-24 days,  Pt's goals mod A- daughter cannot provide this- looking at possible SNF -Continue CIR therapies including PT, OT. pt doesn't want SNF, so daughter trying to see if can take home?  2.  Antithrombotics: -DVT/anticoagulation:  Pharmaceutical: Eliquis             -antiplatelet therapy: ASA 81mg  daily 3. Pain Management:  Oxycodone prn. 5/27- has very little pain- mainly stinging- con't mainly tylenol prn  4. Mood/Behavior/Sleep: LCSW to follow for evaluation and support.              -antipsychotic agents: N/A             -Melatonin prn for insomnia.  5. Neuropsych/cognition: This patient is capable of making decisions on his own behalf. 6. Skin/Wound Care: Continue wound VAC for now.              -pressure relief and foam dressings to left heel, buttocks wounds. Medihoney with gauze   under foam dressing for left heel..  5/27- will need to check when [facility] can be removed on R foot. - Dr Lajoyce Corners is surgeon  5/28- will d/w Rinaldo Cloud to see if can check when can be removed- surgery was 5/22- so been 6 days?  5/29- can get removed 5/29- 6/2  Vac off.  Continue local dressings 7. Fluids/Electrolytes/Nutrition: Strict I/O w/daily weights             --Add  boost to help w/protein stores and promote healing.              --On zinc and Vitamin C 8. Bacteremia with osteomyelitis: Completed IV antibiotics and s/p Ray amputation             --Mop tx with Augmentin/doxycycline through June 5 9. Hyponatremia/pre-renal azotemia: Na improving form 131-->134. BUN with rise to 32.              --encourage fluid intake and should improve off Lasix.  --hyponatremia resolved off HCTZ. 5/27- BUN still 29- if rises above 30, will give IVFs- due to hx of fluid overload, will not add IVFs before then 5/28- will recheck Thursday 5/30- BUN up to 38- but Weight up 4 kg- will try giving Lasix due to fluid overload- in tissues-  and if not better, will call IM 5/31- Bun 45- and Cr 1.14- will need some IVFs, and hopefully won't third space- will give 75cc/hour x 12 hours and recheck in AM 6/1 BUN sl improved but Cr up to 1.27. Potassium now low  -nurse gave PRN lasix this morning after running ivf  -dc prn lasix dose    -resume IVF @50cc /hr  6/2 labs improved today---  -edema unchanged  -will dc IVF  -continue Kdur supplement  -recheck BMET tomorrow 10. ABLA/B12 deficiency: Continue to monitor. Has high ferritin level with MCV 104. -daily B12 injections until discharge from rehab -transfuse for hgb <7 5/27- Hb stable at 7.7- will monitor trends  5/31- Transfused yesterday for Hb of 7.1- called GI- fecal occult was negative x2- so per GI, not need for Endoscopy- 11. Vitamin B 12 deficiency: Vitam in B 12< 50 with recheck after supplementation > 7,500 12. Fluid overload: Daily wts with strict I/O. Monitor for signs of overload.  --has been on IV lasix 20 mg since 05/08 w/recs to change to to prn. -will change lasix to 20mg  daily prn and monitor weights daily.  --will add low salt diet.  5/27- will add Daily weights- and monitor  trend 5/29- d/w nursing about getting pt weighed again.  5/30- weight up 4 kg- wil give Lasix 40 mg x1 and monitor weight if needs to give more tomorrow.  5/31- weight down to 84.7 kg today- down 5 kg- and now dry- will give 12 hours of 75cc/hour of IVFs Filed Weights   09/17/22 1752 09/18/22 0500 09/19/22 0500  Weight: 84.7 kg 88.6 kg 90.1 kg   6/1 back to prior weight. Legs look ok. They have 1+ edema at best. No sob, pt feels well. Recent ECHO with 70%  EF, Heart is in reg rhythm  -IVF at 50cc/hr  -recheck bmet, weights, etc tomorrow 6/2 Weight up. Edema unchanged. No sob, chest clear on exam, I/O's negative 3.6L  -dc IVF  -continue to monitor clinically.    13. Prostate cancer             -monitor voiding patterns             -flomax             -f/u as outpt with Dr. Retta Diones  5/28- s/p radiation for prostate CA- likely cause for Incontinence 14. New onset A fib:             -rate is controlled             -transitioned to eliquis by cardiology, continues on ASA as well  5/27- HR in 50s-60s- sounds like still in some Afib- con't regimen  6/2 pt regular rhythm today. May go in and out  15. Urinary incontinence  5/27- will stop Purewick for now- put urinal where pt could reach- if has a lot of incontinence due to prostate CA- might need purewick at night- will see trends. 5/28- having urinary incontinence per pt- had multiple rounds of radiation for prostate CA- likely cause for urinary incontinence-  will see if can do purewick at night- and d/w team   5/29- d/w nursing- can do condom cath, however pt really wants to try urinal- so will not place condom cath at this time. 6/1 continue condom cath 16. Constipation  5/29- will give sorbitol 30cc and if doesn't work, will give soap suds enema  5/30- LBM last night after soap suds enema- large  5/31- was disimpacted of 2 extra large Bms and continued to have BM's- Abdomen much less distended- feels better  6/2 moved bowels 6/1      LOS: 9 days A FACE TO FACE EVALUATION WAS PERFORMED  Ranelle Oyster 09/19/2022, 11:20 AM

## 2022-09-19 NOTE — Progress Notes (Signed)
Physical Therapy Weekly Progress Note  Patient Details  Name: [name]. MRN: [id number] Date of Birth: 25-Jan-1929  Beginning of progress report period: Sep 11, 2022 End of progress report period: September 19, 2022  Today's Date: 09/19/2022 PT Individual Time: 1020-1115 PT Individual Time Calculation (min): 55 min   Patient has met 2 of 3 short term goals.  Pt is progressing with great improvements in STS, now requiring assist of 1 most sessions, but occasionally more with fatigue or stiffness. Pt can stand with as little as mod a from an elevated surface, but is limited by posterior bias and weightbearing precautions. Pt did not meet gait training goal as he has not yet been able to clear RLE and adequately support body on BUE.   Patient continues to demonstrate the following deficits muscle weakness and muscle joint tightness, decreased cardiorespiratoy endurance, unbalanced muscle activation, and decreased postural control, decreased balance strategies, and difficulty maintaining precautions and therefore will continue to benefit from skilled PT intervention to increase functional independence with mobility.  Patient progressing toward long term goals..  Continue plan of care.  PT Short Term Goals Week 1:  PT Short Term Goal 1 (Week 1): Pt will tolerate sitting up between sessions. PT Short Term Goal 1 - Progress (Week 1): Met PT Short Term Goal 2 (Week 1): Pt will perform STS with + 1 assist PT Short Term Goal 2 - Progress (Week 1): Met PT Short Term Goal 3 (Week 1): Pt will ambulate >5 ft with LRAD PT Short Term Goal 3 - Progress (Week 1): Not met Week 2:  PT Short Term Goal 1 (Week 2): Pt will consistently perform STS with RW and +1 assist PT Short Term Goal 2 (Week 2): Pt will attempt a stand pivot transfer PT Short Term Goal 3 (Week 2): Pt will propel w/c >300 ft  Skilled Therapeutic Interventions/Progress Updates:    Pt received in recliner and agreeable to therapy.  Pt reports  chronic hip discomfort that relieved with position change and occasional R foot pain that relieved with rest.   Pt with purewick in place at start of session d/t leaking condom cath. Discussed with RN, and decided to leave pure wick for time and best use of session d/t frequent urgency and sometimes incontinence, so in room session this date.   Pt performed Sit to stand with stedy x 4 with cues for anterior weight shift and heavy mod A in preparation to progress with RW. Extended rest breaks required. Pt did self report "I think I put too much weight on that foot," referring to his RLE weight bearing precautions, showing good awareness of deficits.   Progressed to sit<>stand with RW, pt able to perform with mod A, therapist providing assist mostly for anterior weight shift and small amount of power up. Pt able to perform RLE marches, not fully clearing floor d/t hx of R THA with mobility limitations. On second attempt, pt unable to complete full set d/t posterior lean requiring assist into sitting for safety. Pt did require assistance to stabilize RW and step by step cueing for set up and body placement for standing.  Attempted small pre-step activity with RLE, but pt unable to bring foot forward or back with current fatigue level.   Pt then performed anterior leans with UE on arm rests of chair to practice anterior weight shift and lifting hips. Pt was able to achieve with min A for positioning and cueing throughout. Pt remained seated in  chair at end of session and was left with all needs in reach and alarm active.   Therapy Documentation Precautions:  Precautions Precautions: Fall Precaution Comments: L heel, R buttocks pressure injuries; HoH Required Braces or Orthoses: Other Brace Other Brace: R post op shoe Restrictions Weight Bearing Restrictions: Yes RLE Weight Bearing: Touchdown weight bearing Other Position/Activity Restrictions: HOH General:   V  Therapy/Group: Individual  Therapy  [name] 09/19/2022, 10:47 AM

## 2022-09-19 NOTE — Plan of Care (Signed)
  Problem: RH BLADDER ELIMINATION Goal: RH STG MANAGE BLADDER WITH ASSISTANCE Description: STG Manage Bladder With min/mod Assistance Outcome: Not Progressing; male purewick; incontinence

## 2022-09-19 NOTE — Progress Notes (Signed)
Occupational Therapy Session Note  Patient Details  Name: Jesse HADERLIE Sr. MRN: [id number] Date of Birth: Jul 20, 1928  Today's Date: 09/19/2022 OT Individual Time: 0800-0910 OT Individual Time Calculation (min): 70 min    Short Term Goals: Week 1:  OT Short Term Goal 1 (Week 1): Patient will flex forward to reach to feet while seated in wheelchair with min assist OT Short Term Goal 1 - Progress (Week 1): Progressing toward goal OT Short Term Goal 2 (Week 1): Patient will transition from an elevated surface sitting to partial standing (as needed for dressing) with max assist OT Short Term Goal 2 - Progress (Week 1): Progressing toward goal OT Short Term Goal 3 (Week 1): In preparation for toileting, patient will complete squat pivot transfer with max assist OT Short Term Goal 3 - Progress (Week 1): Progressing toward goal OT Short Term Goal 4 (Week 1): Patient will don/doff upper body clothing with set up assistance and increased time. OT Short Term Goal 4 - Progress (Week 1): Progressing toward goal  Skilled Therapeutic Interventions/Progress Updates:    Patient in bed at the time of arrival, patient presented with pleasant disposition and a willingness to participate in skilled OT services.  Patient reported pain response associated with the L hip  of a 9 on a 0-10 scale. The pt went on to say that nursing was informed via PCA.  While introducing myself, nursing came in to provide pain medication to address his pain and his morning medication regimen.  Also the  phlebotomist came in to obtain blood work, as well as, the MD to complete morning meds.  Following this process, the pt was in agreement with completing BADL related task with bathing at EOB . The pt was able to transfer from supine to EOB with ModA using the bed rails.  While seat EOB, the pt was able to maintain sit balance for >30 minutes using the bed rails for additional balance.  The pt was able to wash his face, chest, BUE, stomach,  midriff, and bilateral upper legs with s/u assist at EOB. The  pt was MaxA for LB bathing.   The pt able to doff his hospital shirt with MinA and presented as  Max Assist with his brief and pants. The pt was then transferred using the Stedy x2 to the recliner with his feet positioned on pillows in  a recline position.  The bed side table and call light were both within reach and all additional needs were addressed prior to exiting the room.   Therapy Documentation Precautions:  Precautions Precautions: Fall Precaution Comments: L heel, R buttocks pressure injuries; HoH Required Braces or Orthoses: Other Brace Other Brace: R post op shoe Restrictions Weight Bearing Restrictions: Yes RLE Weight Bearing: Touchdown weight bearing Other Position/Activity Restrictions: HOH Therapy/Group: Individual Therapy  Lavona Mound 09/19/2022, 12:10 PM

## 2022-09-20 LAB — CBC WITH DIFFERENTIAL/PLATELET
Abs Immature Granulocytes: 0.01 10*3/uL (ref 0.00–0.07)
Basophils Absolute: 0.1 10*3/uL (ref 0.0–0.1)
Basophils Relative: 1 %
Eosinophils Absolute: 0.3 10*3/uL (ref 0.0–0.5)
Eosinophils Relative: 4 %
HCT: 31.2 % — ABNORMAL LOW (ref 39.0–52.0)
Hemoglobin: 10.3 g/dL — ABNORMAL LOW (ref 13.0–17.0)
Immature Granulocytes: 0 %
Lymphocytes Relative: 38 %
Lymphs Abs: 2.7 10*3/uL (ref 0.7–4.0)
MCH: 34 pg (ref 26.0–34.0)
MCHC: 33 g/dL (ref 30.0–36.0)
MCV: 103 fL — ABNORMAL HIGH (ref 80.0–100.0)
Monocytes Absolute: 0.7 10*3/uL (ref 0.1–1.0)
Monocytes Relative: 10 %
Neutro Abs: 3.3 10*3/uL (ref 1.7–7.7)
Neutrophils Relative %: 47 %
Platelets: 315 10*3/uL (ref 150–400)
RBC: 3.03 MIL/uL — ABNORMAL LOW (ref 4.22–5.81)
RDW: 19.4 % — ABNORMAL HIGH (ref 11.5–15.5)
WBC: 7 10*3/uL (ref 4.0–10.5)
nRBC: 0 % (ref 0.0–0.2)

## 2022-09-20 LAB — BASIC METABOLIC PANEL
Anion gap: 5 (ref 5–15)
BUN: 32 mg/dL — ABNORMAL HIGH (ref 8–23)
CO2: 23 mmol/L (ref 22–32)
Calcium: 8.4 mg/dL — ABNORMAL LOW (ref 8.9–10.3)
Chloride: 108 mmol/L (ref 98–111)
Creatinine, Ser: 0.95 mg/dL (ref 0.61–1.24)
GFR, Estimated: >60 mL/min (ref >60)
Glucose, Bld: 95 mg/dL (ref 70–99)
Potassium: 4.7 mmol/L (ref 3.5–5.1)
Sodium: 136 mmol/L (ref 135–145)

## 2022-09-20 NOTE — Progress Notes (Signed)
Patient ID: Jesse Pollock Sr., male   DOB: 10-30-1928, 89 y.o.   MRN: [id number]  SW returned phone call to pt dtr Windell Moulding to discuss family edu. Reports her and her brother will be here for the afternoon OT session at 1pm, and family edu scheduled for tomorrow 1pm-4pm. Family remains undecided on best plan of care.   Cecile Sheerer, MSW, LCSWA Office: [phone] Cell: [phone] Fax: [phone]

## 2022-09-20 NOTE — Progress Notes (Signed)
Occupational Therapy Session Note  Patient Details  Name: Jesse SORANNO Sr. MRN: [id number] Date of Birth: [birth year]  Today's Date: 09/20/2022 OT Individual Time: 1027-2536 OT Individual Time Calculation (min): 70 min    Short Term Goals: Week 2:  OT Short Term Goal 1 (Week 2): Patient will flex forward to reach to feet while seated in wheelchair with min assist OT Short Term Goal 2 (Week 2): Patient will transition from an elevated surface sitting to partial standing (as needed for dressing) with max assist OT Short Term Goal 3 (Week 2): In preparation for toileting, patient will complete squat pivot transfer with max assist OT Short Term Goal 4 (Week 2): Patient will don/doff upper body clothing with set up assistance and increased time.  Skilled Therapeutic Interventions/Progress Updates:    OT intervention with focus on bed mobility, sittting balance, sit<>stand in [place], UB bathing/dressing w/c level at sink, standing balance, and activity tolerance to increase independence with BADLs. Supne>sit EOB with mod A. Pt able to scoot to EOB wihtout assistance. Tot A for threading BLE into pants. Dependent for pulling pants over hips while standing in [place]. UB bathing with supervision. UB dressing with min A. Pt requires multiple rest breaks during session. Pt remained seated in w/c with all needs within reach.   Therapy Documentation Precautions:  Precautions Precautions: Fall Precaution Comments: L heel, R buttocks pressure injuries; HoH Required Braces or Orthoses: Other Brace Other Brace: R post op shoe Restrictions Weight Bearing Restrictions: Yes RLE Weight Bearing: Touchdown weight bearing Other Position/Activity Restrictions: HOH  Pain:  Pt reports no pain this morning   Therapy/Group: Individual Therapy  [name] 09/20/2022, 9:26 AM

## 2022-09-20 NOTE — Progress Notes (Signed)
From report and by patient acknowledge bowel program completed in the early evening on previous shift. Explained to patient would change him this evening. Medium type 5 bowel in briefs. However, upon wiping continued to have fecal matter from rectum exit. With being on right lateral with pressure on right gluteus maximus remaining fecal matter able to exit patient. Type 6 clay and light brown color. Mucosa and bubbling as well. Patient does acknowledge immediate relief after bowel movement. Pure Wick changed due to bright red blood in the Pure [place] as well as discoloration of the bag. Peri care performed but source of blood unknown. Repositioned in bed and no further issues at this time.

## 2022-09-20 NOTE — Progress Notes (Signed)
IP Rehab Bowel Program Documentation   Bowel Program Start time  [phone]  Dig Stim Indicated? Yes  Dig Stim Prior to Suppository or mini Enema X 1   Output from dig stim: Minimal  Ordered intervention: Suppository Yes , mini enema No ,   Repeat dig stim after Suppository or Mini enema  X {Numbers; 1-5:17750},  Output? {Desc; minimal/small/moderate/large/very large:110034}   Bowel Program Complete? {YES/NO:21197}, handoff given ***  Patient Tolerated? {YES/NO:21197}

## 2022-09-20 NOTE — Progress Notes (Signed)
PROGRESS NOTE   Subjective/Complaints:  Pt reports doing pretty good- little tired- Occ stinging pain in RLE, but otherwise, doing well.  LBM x2 yesterday.     ROS:  Pt denies SOB, abd pain, CP, N/V/C/D, and vision changes  Except for HPI Objective:   No results found. Recent Labs    09/18/22 0549 09/20/22 0638  WBC 8.2 7.0  HGB 13.6 10.3*  HCT 41.7 31.2*  PLT 137* 315    Recent Labs    09/19/22 0813 09/20/22 0638  NA 135 136  K 4.2 4.7  CL 105 108  CO2 19* 23  GLUCOSE 149* 95  BUN 28* 32*  CREATININE 0.97 0.95  CALCIUM 8.3* 8.4*    Intake/Output Summary (Last 24 hours) at 09/20/2022 1249 Last data filed at 09/20/2022 0439 Gross per 24 hour  Intake 480 ml  Output 1200 ml  Net -720 ml     Pressure Injury 08/25/22 Heel Left Deep Tissue Pressure Injury - Purple or maroon localized area of discolored intact skin or blood-filled blister due to damage of underlying soft tissue from pressure and/or shear. 6.5cm x 5.5cm (Active)  08/25/22 0730  Location: Heel  Location Orientation: Left  Staging: Deep Tissue Pressure Injury - Purple or maroon localized area of discolored intact skin or blood-filled blister due to damage of underlying soft tissue from pressure and/or shear.  Wound Description (Comments): 6.5cm x 5.5cm  Present on Admission: Yes     Pressure Injury 08/25/22 Buttocks Right Stage 2 -  Partial thickness loss of dermis presenting as a shallow open injury with a red, pink wound bed without slough. (Active)  08/25/22   Location: Buttocks  Location Orientation: Right  Staging: Stage 2 -  Partial thickness loss of dermis presenting as a shallow open injury with a red, pink wound bed without slough.  Wound Description (Comments):   Present on Admission: Yes     Pressure Injury 09/10/22 Sacrum Medial Stage 2 -  Partial thickness loss of dermis presenting as a shallow open injury with a red, pink wound  bed without slough. Elongated area over sacrum (Active)  09/10/22 1912  Location: Sacrum  Location Orientation: Medial  Staging: Stage 2 -  Partial thickness loss of dermis presenting as a shallow open injury with a red, pink wound bed without slough.  Wound Description (Comments): Elongated area over sacrum  Present on Admission: Yes    Physical Exam: Vital Signs Blood pressure (!) 159/68, pulse (!) 51, temperature 98.1 F (36.7 C), temperature source Oral, resp. rate 16, height 5\' 7"  (1.702 m), weight 91 kg, SpO2 100 %.         General: awake, alert, appropriate, HOH- -0 sitting up in bed; 50-75% tray eaten;  NAD HENT: conjugate gaze; oropharynx moist CV: irregular rhythm, bradycardic rate; no JVD Pulmonary: CTA B/L; no W/R/R- good air movement GI: soft, NT, ND, (+)BS- more normoactive Psychiatric: appropriate Neurological: Ox3 Extremities: RLE>>LLE swelling- pretty stable Neurologic: Fairly alert. Oriented to person, place, month/year. Cranial nerves II through XII intact, motor strength is 5/5 in bilateral deltoid, bicep, tricep, grip, 3 - bilateral hip flexor, 4/5 knee extensors, ankle dorsiflexor and plantar flexor Sensory exam  normal sensation to light touchin bilateral lower extremities  Musculoskeletal:   No joint swelling. Left hip a little tender with palpation laterally, PROM  Assessment/Plan: 1. Functional deficits which require 3+ hours per day of interdisciplinary therapy in a comprehensive inpatient rehab setting. Physiatrist is providing close team supervision and 24 hour management of active medical problems listed below. Physiatrist and rehab team continue to assess barriers to discharge/monitor patient progress toward functional and medical goals  Care Tool:  Bathing    Body parts bathed by patient: Right arm, Left arm, Chest, Abdomen, Right upper leg, Left upper leg, Face   Body parts bathed by helper: Buttocks, Right lower leg, Left lower leg Body  parts n/a: Front perineal area   Bathing assist Assist Level: Moderate Assistance - Patient 50 - 74%     Upper Body Dressing/Undressing Upper body dressing   What is the patient wearing?: Pull over shirt    Upper body assist Assist Level: Minimal Assistance - Patient > 75%    Lower Body Dressing/Undressing Lower body dressing      What is the patient wearing?: Pants, Incontinence brief     Lower body assist Assist for lower body dressing: Dependent - Patient 0%     Toileting Toileting    Toileting assist Assist for toileting: 2 Helpers     Transfers Chair/bed transfer  Transfers assist     Chair/bed transfer assist level: 2 Helpers     Locomotion Ambulation   Ambulation assist   Ambulation activity did not occur: Safety/medical concerns          Walk 10 feet activity   Assist  Walk 10 feet activity did not occur: Safety/medical concerns        Walk 50 feet activity   Assist Walk 50 feet with 2 turns activity did not occur: Safety/medical concerns         Walk 150 feet activity   Assist Walk 150 feet activity did not occur: Safety/medical concerns         Walk 10 feet on uneven surface  activity   Assist Walk 10 feet on uneven surfaces activity did not occur: Safety/medical concerns         Wheelchair     Assist Is the patient using a wheelchair?: Yes (Pt left in w/c but did not attempt propulsion or dependent transport.) Type of Wheelchair: Manual           Wheelchair 50 feet with 2 turns activity    Assist    Wheelchair 50 feet with 2 turns activity did not occur: Safety/medical concerns       Wheelchair 150 feet activity     Assist  Wheelchair 150 feet activity did not occur: Safety/medical concerns       Blood pressure (!) 159/68, pulse (!) 51, temperature 98.1 F (36.7 C), temperature source Oral, resp. rate 16, height 5\' 7"  (1.702 m), weight 91 kg, SpO2 100 %.  Medical Problem List and  Plan: 1. Functional deficits secondary to debility related to sepsis from Right 5th MT osteomyelitis and other medical complications. Pt is s/p right 5th ray amputation 5/22.             -TDWB RLE             -patient may not yet shower due to [facility]             -ELOS/Goals: 21-24 days,  Pt's goals mod A- daughter cannot provide this- looking at possible SNF -Continue  CIR therapies including PT, OT. pt doesn't want SNF, so daughter trying to see if can take home? Con't CIR PT and OOT- daughter/family coming in for training to see if can take him home if possible.  2.  Antithrombotics: -DVT/anticoagulation:  Pharmaceutical: Eliquis             -antiplatelet therapy: ASA 81mg  daily 3. Pain Management:  Oxycodone prn. 5/27- has very little pain- mainly stinging- con't mainly tylenol prn   6/3- pain controlled- mild-  4. Mood/Behavior/Sleep: LCSW to follow for evaluation and support.              -antipsychotic agents: N/A             -Melatonin prn for insomnia.  5. Neuropsych/cognition: This patient is capable of making decisions on his own behalf. 6. Skin/Wound Care: Continue wound VAC for now.              -pressure relief and foam dressings to left heel, buttocks wounds. Medihoney with gauze   under foam dressing for left heel..  5/27- will need to check when [facility] can be removed on R foot. - Dr Lajoyce Corners is surgeon  5/28- will d/w Rinaldo Cloud to see if can check when can be removed- surgery was 5/22- so been 6 days?  5/29- can get removed 5/29- 6/2  Vac off.  Continue local dressings 7. Fluids/Electrolytes/Nutrition: Strict I/O w/daily weights             --Add  boost to help w/protein stores and promote healing.              --On zinc and Vitamin C 8. Bacteremia with osteomyelitis: Completed IV antibiotics and s/p Ray amputation             --Mop tx with Augmentin/doxycycline through June 5 9. Hyponatremia/pre-renal azotemia: Na improving form 131-->134. BUN with rise to 32.             --encourage  fluid intake and should improve off Lasix.  --hyponatremia resolved off HCTZ. 5/27- BUN still 29- if rises above 30, will give IVFs- due to hx of fluid overload, will not add IVFs before then 5/28- will recheck Thursday 5/30- BUN up to 38- but Weight up 4 kg- will try giving Lasix due to fluid overload- in tissues-  and if not better, will call IM 5/31- Bun 45- and Cr 1.14- will need some IVFs, and hopefully won't third space- will give 75cc/hour x 12 hours and recheck in AM 6/1 BUN sl improved but Cr up to 1.27. Potassium now low  -nurse gave PRN lasix this morning after running ivf  -dc prn lasix dose    -resume IVF @50cc /hr  6/2 labs improved today---  -edema unchanged  -will dc IVF  -continue Kdur supplement  -recheck BMET tomorrow 6/3- Cr 0.95 and BUN 32- a little dry-  10. ABLA/B12 deficiency: Continue to monitor. Has high ferritin level with MCV 104. -daily B12 injections until discharge from rehab -transfuse for hgb <7 5/27- Hb stable at 7.7- will monitor trends  5/31- Transfused yesterday for Hb of 7.1- called GI- fecal occult was negative x2- so per GI, not need for Endoscopy- 11. Vitamin B 12 deficiency: Vitam in B 12< 50 with recheck after supplementation > 7,500 12. Fluid overload: Daily wts with strict I/O. Monitor for signs of overload.  --has been on IV lasix 20 mg since 05/08 w/recs to change to to prn. -will change lasix to 20mg  daily prn  and monitor weights daily.  --will add low salt diet.  5/27- will add Daily weights- and monitor trend 5/29- d/w nursing about getting pt weighed again.  5/30- weight up 4 kg- wil give Lasix 40 mg x1 and monitor weight if needs to give more tomorrow.  5/31- weight down to 84.7 kg today- down 5 kg- and now dry- will give 12 hours of 75cc/hour of IVFs Filed Weights   09/18/22 0500 09/19/22 0500 09/20/22 0500  Weight: 88.6 kg 90.1 kg 91 kg   6/1 back to prior weight. Legs look ok. They have 1+ edema at best. No sob, pt feels well.  Recent ECHO with 70% EF, Heart is in reg rhythm  -IVF at 50cc/hr  -recheck bmet, weights, etc tomorrow 6/2 Weight up. Edema unchanged. No sob, chest clear on exam, I/O's negative 3.6L  -dc IVF  -continue to monitor clinically.  6/3- BUN still up at 32- but bettter than 45 on Friday  13. Prostate cancer             -monitor voiding patterns             -flomax             -f/u as outpt with Dr. Retta Diones  5/28- s/p radiation for prostate CA- likely cause for Incontinence 14. New onset A fib:             -rate is controlled             -transitioned to eliquis by cardiology, continues on ASA as well  5/27- HR in 50s-60s- sounds like still in some Afib- con't regimen  6/2 pt regular rhythm today. May go in and out  15. Urinary incontinence  5/27- will stop Purewick for now- put urinal where pt could reach- if has a lot of incontinence due to prostate CA- might need purewick at night- will see trends. 5/28- having urinary incontinence per pt- had multiple rounds of radiation for prostate CA- likely cause for urinary incontinence-  will see if can do purewick at night- and d/w team   5/29- d/w nursing- can do condom cath, however pt really wants to try urinal- so will not place condom cath at this time. 6/1 continue condom cath 16. Constipation  5/29- will give sorbitol 30cc and if doesn't work, will give soap suds enema  5/30- LBM last night after soap suds enema- large  5/31- was disimpacted of 2 extra large Bms and continued to have BM's- Abdomen much less distended- feels better  6/2 moved bowels 6/1  6/3- LBM x2 yesterday        LOS: 10 days A FACE TO FACE EVALUATION WAS PERFORMED  Roseana Rhine 09/20/2022, 12:49 PM

## 2022-09-20 NOTE — Progress Notes (Signed)
Occupational Therapy Session Note  Patient Details  Name: [name]. MRN: [id number] Date of Birth: 1928/05/09  Today's Date: 09/20/2022 OT Individual Time: 1300-1345 OT Individual Time Calculation (min): 45 min    Short Term Goals: Week 2:  OT Short Term Goal 1 (Week 2): Patient will flex forward to reach to feet while seated in wheelchair with min assist OT Short Term Goal 2 (Week 2): Patient will transition from an elevated surface sitting to partial standing (as needed for dressing) with max assist OT Short Term Goal 3 (Week 2): In preparation for toileting, patient will complete squat pivot transfer with max assist OT Short Term Goal 4 (Week 2): Patient will don/doff upper body clothing with set up assistance and increased time.  Skilled Therapeutic Interventions/Progress Updates:    Pt resting in w/c upon arrival with daughter present. Reviewed pt's progress since admission. Discussed pt's currentl level of assistance and LTGs (mod A). Daughter reports that she had been providing care for pt for 5 weeks prior to admission to hospital. Sit<>stand in [place] X 2 with lengthy rest break. Pt required mod A for sit<>stand in [place] and increased assistance to stand upright to allow for placement of paddles. Pt requested to return to bed since he had been up since 0830. Sit<>stand from w/c with max A in [place]. Max A for sit>supine in bed. Tot A +2 for repositioning. Pt remained in bed with all needs within reach. Daughter present. LPN [place] informed that pt would like to get up in his w/c for dinner.   Therapy Documentation Precautions:  Precautions Precautions: Fall Precaution Comments: L heel, R buttocks pressure injuries; HoH Required Braces or Orthoses: Other Brace Other Brace: R post op shoe Restrictions Weight Bearing Restrictions: Yes RLE Weight Bearing: Touchdown weight bearing Other Position/Activity Restrictions: HOH General:   Vital Signs:  Pain: Pt denies pain this  afternoon      Therapy/Group: Individual Therapy  [name] 09/20/2022, 2:27 PM

## 2022-09-20 NOTE — Progress Notes (Signed)
Physical Therapy Session Note  Patient Details  Name: Jesse PHELAN Sr. MRN: [id number] Date of Birth: [birth year]  Today's Date: 09/20/2022 PT Individual Time: 0945-1100 PT Individual Time Calculation (min): 75 min   Short Term Goals: Week 2:  PT Short Term Goal 1 (Week 2): Pt will consistently perform STS with RW and +1 assist PT Short Term Goal 2 (Week 2): Pt will attempt a stand pivot transfer PT Short Term Goal 3 (Week 2): Pt will propel w/c >300 ft  Skilled Therapeutic Interventions/Progress Updates:    Pt seated in w/c on arrival and agreeable to therapy. Pt reports chronic pain well managed at this time. Pt still on purewick this session, so remained in room d/t urgency and incontinence when not in place. Nsg present at start of session for meds pass.   Pt participated in seated therex for "warm up" and to reduce stiffness prior to mobility, x 20 reps each: alternating marches, alternating LAQ, ankle pumps with isometric LAQ. Pt reports improvement in stiffness.   Progressed to transfer training. Required extended rest breaks for fatigue management. Noted best performance with therapist standing on pt's R side and assisting with anterior weight shift at trunk.  Sit to stand with mod-max A, for power up and anterior weight shift, frequent cueing required for hand placement.  Pt was able to slightly clear L foot while maintaining RLE TDWB using BUE support on RW. However, Noted scissoring of L foot and reduced ability to abduct foot actively. Repeated x 4 bouts, with noted improvement in BOS width.  Transitioned to w/c push ups, 3 x 4 for improved UE strength for RW management and BLE offloading. 2-3 minute rest breaks required for optimal muscle recovery to perform full reps.  Returned to standing activity, able to clear RLE for marches with mod A, 3 x  8-15.  Completed session with superset x 2 of cross punches and overhead reach with 2.5 lb weight x 20 reps for UE strength and endurance.   Did report some L hip discomfort during session, relieved with rest and positioning. Pt remained in w/c at end of session and was left with all needs in reach and alarm active.   Therapy Documentation Precautions:  Precautions Precautions: Fall Precaution Comments: L heel, R buttocks pressure injuries; HoH Required Braces or Orthoses: Other Brace Other Brace: R post op shoe Restrictions Weight Bearing Restrictions: Yes RLE Weight Bearing: Touchdown weight bearing Other Position/Activity Restrictions: HOH General:       Therapy/Group: Individual Therapy  Milia Warth C Derrel Moore 09/20/2022, 10:00 AM

## 2022-09-21 MED ORDER — BISACODYL 10 MG RE SUPP: 10.0000 mg | Freq: Every day | RECTAL | Status: DC | PRN

## 2022-09-21 NOTE — Progress Notes (Signed)
Physical Therapy Session Note  Patient Details  Name: Jesse WESTLAND Sr. MRN: [id number] Date of Birth: 1928-11-11  Today's Date: 09/21/2022 PT Individual Time: 1000-1100 PT Individual Time Calculation (min): 60 min   Short Term Goals: Week 2:  PT Short Term Goal 1 (Week 2): Pt will consistently perform STS with RW and +1 assist PT Short Term Goal 2 (Week 2): Pt will attempt a stand pivot transfer PT Short Term Goal 3 (Week 2): Pt will propel w/c >300 ft  Skilled Therapeutic Interventions/Progress Updates:    Pt seated in w/c on arrival and agreeable to therapy. Pt reports stiffness and discomfort with mobility, rest and positioning as needed. Pt still on pure wick, so bedside therapy only to prevent bouts of incontinence.   Pt participated in seated therex to address stiffness from sitting and prepare for more strenuous exercise, 2 x 20 each: seated marches, LAQ, isometric LAQ with ankle pumps.   Pt required extended rest breaks to manage fatigue, DOE, and for optimal muscle recovery. Sit to stand with mod-max A, with therapist positioned on R side and providing anterior force to facilitate good power up.  Pt performed marches x 10 BLE, without rest, standing ~30 sec in prep for Stand pivot transfer.  Pt performed Stand pivot transfer x 4 with max A for balance and facilitating WB precautions. Pt with difficulty moving RLE for intentional step vs lifting to march. No true LOB or knee buckling noted.  Second attempt required max to sit d/t posterior lean. Discontinued after last attempt d/t increasing weight on RLE, though maintained through heel off offloading shoe.   Rest of session pt performed seated exercises for global endurance. Super set of bicep curl x 20 with 2.5# weights and  w/c pushups x 5, x 4 bouts. At this time, pt's son and DIL arrived. Family with questions about current condition and prognosis. Discussed with pt's approval. Discussed that pt will require high levels of care at  d/c and would benefit from placement to allow for continued healing to reduce caregiver burden. Pt's son expressed understanding. Pt remained in w/c, was left with all needs in reach and alarm active.   Therapy Documentation Precautions:  Precautions Precautions: Fall Precaution Comments: L heel, R buttocks pressure injuries; HoH Required Braces or Orthoses: Other Brace Other Brace: R post op shoe Restrictions Weight Bearing Restrictions: Yes RLE Weight Bearing: Touchdown weight bearing Other Position/Activity Restrictions: HOH General:       Therapy/Group: Individual Therapy  [name] 09/21/2022, 10:12 AM

## 2022-09-21 NOTE — Progress Notes (Signed)
PROGRESS NOTE   Subjective/Complaints:  Pt reports uncomfortable this AM- from L hip/positioning in bed- attempted to get him comfortable, but needs NT to help.  LBM last night per pt- actually got bowel program and had 2 BM's- 1 large and 1 small.     ROS:   Pt denies SOB, abd pain, CP, N/V/C/D, and vision changes   Except for HPI Objective:   No results found. Recent Labs    09/20/22 0638  WBC 7.0  HGB 10.3*  HCT 31.2*  PLT 315    Recent Labs    09/19/22 0813 09/20/22 0638  NA 135 136  K 4.2 4.7  CL 105 108  CO2 19* 23  GLUCOSE 149* 95  BUN 28* 32*  CREATININE 0.97 0.95  CALCIUM 8.3* 8.4*    Intake/Output Summary (Last 24 hours) at 09/21/2022 0852 Last data filed at 09/21/2022 0550 Gross per 24 hour  Intake 480 ml  Output 1000 ml  Net -520 ml     Pressure Injury 08/25/22 Heel Left Deep Tissue Pressure Injury - Purple or maroon localized area of discolored intact skin or blood-filled blister due to damage of underlying soft tissue from pressure and/or shear. 6.5cm x 5.5cm (Active)  08/25/22 0730  Location: Heel  Location Orientation: Left  Staging: Deep Tissue Pressure Injury - Purple or maroon localized area of discolored intact skin or blood-filled blister due to damage of underlying soft tissue from pressure and/or shear.  Wound Description (Comments): 6.5cm x 5.5cm  Present on Admission: Yes     Pressure Injury 08/25/22 Buttocks Right Stage 2 -  Partial thickness loss of dermis presenting as a shallow open injury with a red, pink wound bed without slough. (Active)  08/25/22   Location: Buttocks  Location Orientation: Right  Staging: Stage 2 -  Partial thickness loss of dermis presenting as a shallow open injury with a red, pink wound bed without slough.  Wound Description (Comments):   Present on Admission: Yes     Pressure Injury 09/10/22 Sacrum Medial Stage 2 -  Partial thickness loss of  dermis presenting as a shallow open injury with a red, pink wound bed without slough. Elongated area over sacrum (Active)  09/10/22 1912  Location: Sacrum  Location Orientation: Medial  Staging: Stage 2 -  Partial thickness loss of dermis presenting as a shallow open injury with a red, pink wound bed without slough.  Wound Description (Comments): Elongated area over sacrum  Present on Admission: Yes    Physical Exam: Vital Signs Blood pressure (!) 153/65, pulse (!) 57, temperature 98 F (36.7 C), resp. rate 16, height 5\' 7"  (1.702 m), weight 90.3 kg, SpO2 100 %.         General: awake, alert, appropriate, sitting up in bed; finished breakfast; NAD HENT: conjugate gaze; oropharynx moist CV: regular rhythm, bradycardic  rate; no JVD Pulmonary: CTA B/L; no W/R/R- good air movement GI: soft, NT, ND, (+)BS- more normoactive Psychiatric: appropriate Neurological: Ox3- very HOH  Extremities: RLE>>LLE swelling- pretty stable Neurologic: Fairly alert. Oriented to person, place, month/year. Cranial nerves II through XII intact, motor strength is 5/5 in bilateral deltoid, bicep, tricep, grip, 3 - bilateral  hip flexor, 4/5 knee extensors, ankle dorsiflexor and plantar flexor Sensory exam normal sensation to light touchin bilateral lower extremities  Musculoskeletal:   No joint swelling. Left hip a little tender with palpation laterally, PROM  Assessment/Plan: 1. Functional deficits which require 3+ hours per day of interdisciplinary therapy in a comprehensive inpatient rehab setting. Physiatrist is providing close team supervision and 24 hour management of active medical problems listed below. Physiatrist and rehab team continue to assess barriers to discharge/monitor patient progress toward functional and medical goals  Care Tool:  Bathing    Body parts bathed by patient: Right arm, Left arm, Chest, Abdomen, Right upper leg, Left upper leg, Face   Body parts bathed by helper:  Buttocks, Right lower leg, Left lower leg Body parts n/a: Front perineal area   Bathing assist Assist Level: Moderate Assistance - Patient 50 - 74%     Upper Body Dressing/Undressing Upper body dressing   What is the patient wearing?: Pull over shirt    Upper body assist Assist Level: Minimal Assistance - Patient > 75%    Lower Body Dressing/Undressing Lower body dressing      What is the patient wearing?: Pants, Incontinence brief     Lower body assist Assist for lower body dressing: Dependent - Patient 0%     Toileting Toileting    Toileting assist Assist for toileting: 2 Helpers     Transfers Chair/bed transfer  Transfers assist     Chair/bed transfer assist level: 2 Helpers     Locomotion Ambulation   Ambulation assist   Ambulation activity did not occur: Safety/medical concerns          Walk 10 feet activity   Assist  Walk 10 feet activity did not occur: Safety/medical concerns        Walk 50 feet activity   Assist Walk 50 feet with 2 turns activity did not occur: Safety/medical concerns         Walk 150 feet activity   Assist Walk 150 feet activity did not occur: Safety/medical concerns         Walk 10 feet on uneven surface  activity   Assist Walk 10 feet on uneven surfaces activity did not occur: Safety/medical concerns         Wheelchair     Assist Is the patient using a wheelchair?: Yes (Pt left in w/c but did not attempt propulsion or dependent transport.) Type of Wheelchair: Manual           Wheelchair 50 feet with 2 turns activity    Assist    Wheelchair 50 feet with 2 turns activity did not occur: Safety/medical concerns       Wheelchair 150 feet activity     Assist  Wheelchair 150 feet activity did not occur: Safety/medical concerns       Blood pressure (!) 153/65, pulse (!) 57, temperature 98 F (36.7 C), resp. rate 16, height 5\' 7"  (1.702 m), weight 90.3 kg, SpO2 100  %.  Medical Problem List and Plan: 1. Functional deficits secondary to debility related to sepsis from Right 5th MT osteomyelitis and other medical complications. Pt is s/p right 5th ray amputation 5/22.             -TDWB RLE             -patient may not yet shower due to [facility]             -ELOS/Goals: 21-24 days,  Pt's goals mod A-  daughter cannot provide this- looking at possible SNF -Continue CIR therapies including PT, OT. pt doesn't want SNF, so daughter trying to see if can take home? Con't CIR PT and OT- team conference today to f/u on progress 2.  Antithrombotics: -DVT/anticoagulation:  Pharmaceutical: Eliquis             -antiplatelet therapy: ASA 81mg  daily 3. Pain Management:  Oxycodone prn. 5/27- has very little pain- mainly stinging- con't mainly tylenol prn   6/3- pain controlled- mild-  4. Mood/Behavior/Sleep: LCSW to follow for evaluation and support.              -antipsychotic agents: N/A             -Melatonin prn for insomnia.  5. Neuropsych/cognition: This patient is capable of making decisions on his own behalf. 6. Skin/Wound Care: Continue wound VAC for now.              -pressure relief and foam dressings to left heel, buttocks wounds. Medihoney with gauze   under foam dressing for left heel..  5/27- will need to check when [facility] can be removed on R foot. - Dr Lajoyce Corners is surgeon  5/28- will d/w Rinaldo Cloud to see if can check when can be removed- surgery was 5/22- so been 6 days?  5/29- can get removed 5/29- 6/2  Vac off.  Continue local dressings 7. Fluids/Electrolytes/Nutrition: Strict I/O w/daily weights             --Add  boost to help w/protein stores and promote healing.              --On zinc and Vitamin C 8. Bacteremia with osteomyelitis: Completed IV antibiotics and s/p Ray amputation             --Mop tx with Augmentin/doxycycline through June 5 9. Hyponatremia/pre-renal azotemia: Na improving form 131-->134. BUN with rise to 32.             --encourage fluid  intake and should improve off Lasix.  --hyponatremia resolved off HCTZ. 5/27- BUN still 29- if rises above 30, will give IVFs- due to hx of fluid overload, will not add IVFs before then 5/28- will recheck Thursday 5/30- BUN up to 38- but Weight up 4 kg- will try giving Lasix due to fluid overload- in tissues-  and if not better, will call IM 5/31- Bun 45- and Cr 1.14- will need some IVFs, and hopefully won't third space- will give 75cc/hour x 12 hours and recheck in AM 6/1 BUN sl improved but Cr up to 1.27. Potassium now low  -nurse gave PRN lasix this morning after running ivf  -dc prn lasix dose    -resume IVF @50cc /hr  6/2 labs improved today---  -edema unchanged  -will dc IVF  -continue Kdur supplement  -recheck BMET tomorrow 6/3- Cr 0.95 and BUN 32- a little dry-  6/4- will recheck Thursday 10. ABLA/B12 deficiency: Continue to monitor. Has high ferritin level with MCV 104. -daily B12 injections until discharge from rehab -transfuse for hgb <7 5/27- Hb stable at 7.7- will monitor trends  5/31- Transfused yesterday for Hb of 7.1- called GI- fecal occult was negative x2- so per GI, not need for Endoscopy- 11. Vitamin B 12 deficiency: Vitam in B 12< 50 with recheck after supplementation > 7,500 12. Fluid overload: Daily wts with strict I/O. Monitor for signs of overload.  --has been on IV lasix 20 mg since 05/08 w/recs to change to to prn. -will change  lasix to 20mg  daily prn and monitor weights daily.  --will add low salt diet.  5/27- will add Daily weights- and monitor trend 5/29- d/w nursing about getting pt weighed again.  5/30- weight up 4 kg- wil give Lasix 40 mg x1 and monitor weight if needs to give more tomorrow.  5/31- weight down to 84.7 kg today- down 5 kg- and now dry- will give 12 hours of 75cc/hour of IVFs Filed Weights   09/19/22 0500 09/20/22 0500 09/21/22 0500  Weight: 90.1 kg 91 kg 90.3 kg   6/1 back to prior weight. Legs look ok. They have 1+ edema at best. No  sob, pt feels well. Recent ECHO with 70% EF, Heart is in reg rhythm  -IVF at 50cc/hr  -recheck bmet, weights, etc tomorrow 6/2 Weight up. Edema unchanged. No sob, chest clear on exam, I/O's negative 3.6L  -dc IVF  -continue to monitor clinically.  6/3- BUN still up at 32- but bettter than 45 on Friday  6/4- will recheck Thursday- BMP and weight stalbe last 3 days 13. Prostate cancer             -monitor voiding patterns             -flomax             -f/u as outpt with Dr. Retta Diones  5/28- s/p radiation for prostate CA- likely cause for Incontinence 14. New onset A fib:             -rate is controlled             -transitioned to eliquis by cardiology, continues on ASA as well  5/27- HR in 50s-60s- sounds like still in some Afib- con't regimen  6/2 pt regular rhythm today. May go in and out  6/4- on nothing to reduce HR- will con't to monitor  15. Urinary incontinence  5/27- will stop Purewick for now- put urinal where pt could reach- if has a lot of incontinence due to prostate CA- might need purewick at night- will see trends. 5/28- having urinary incontinence per pt- had multiple rounds of radiation for prostate CA- likely cause for urinary incontinence-  will see if can do purewick at night- and d/w team   5/29- d/w nursing- can do condom cath, however pt really wants to try urinal- so will not place condom cath at this time. 6/1 continue condom cath 6/4- using condomc ath and purewick- will con't 16. Constipation  5/29- will give sorbitol 30cc and if doesn't work, will give soap suds enema  5/30- LBM last night after soap suds enema- large  5/31- was disimpacted of 2 extra large Bms and continued to have BM's- Abdomen much less distended- feels better  6/2 moved bowels 6/1  6/3- LBM x2 yesterday   I spent a total of  37  minutes on total care today- >50% coordination of care- due to team conference today to f/u on progress- also repositioning pt- was in room prolonged period.        LOS: 11 days A FACE TO FACE EVALUATION WAS PERFORMED  Kyesha Balla 09/21/2022, 8:52 AM

## 2022-09-21 NOTE — Patient Care Conference (Signed)
Inpatient RehabilitationTeam Conference and Plan of Care Update Date: 09/21/2022   Time: 11:17 AM    Patient Name: Jesse GRAZIOLI Sr.      Medical Record Number: [id number]  Date of Birth: 1928/11/11 Sex: Male         Room/Bed: 4W24C/4W24C-01 Payor Info: Payor: MEDICARE / Plan: MEDICARE PART A AND B / Product Type: *No Product type* /    Admit Date/Time:  09/10/2022  5:44 PM  Primary Diagnosis:  Debility  Hospital Problems: Principal Problem:   Debility Active Problems:   Atrial fibrillation (HCC)   Anemia   Constipation   Chronic anticoagulation    Expected Discharge Date: Expected Discharge Date:  (TBD)  Team Members Present: Physician leading conference: Dr. Genice Rouge Social Worker Present: Cecile Sheerer, LCSWA Nurse Present: Vedia Pereyra, RN PT Present: Bernie Covey, PT OT Present: Roney Mans, OT;Ardis Rowan, COTA PPS Coordinator present : Fae Pippin, SLP     Current Status/Progress Goal Weekly Team Focus  Bowel/Bladder   incont of B&B, LBM 09/20/22   Continue bladder and bowel programs   Assist with toileting    Swallow/Nutrition/ Hydration               ADL's   bathing-max A; UB dressing-min A; LB dressing-tot A; sit<>stand with max A; toleting-tot A; fatigues quickly   mod A overall   BADLs,standing balance, tranfsers, endurance, education    Mobility   mod bed mobility, mod A STS ~50% of attempts, max with fatigue or poor set up. stedy transfers   min A transfers and short distance gait  Attempting stand pivot transfer, strength to maintain WB precautions    Communication                Safety/Cognition/ Behavioral Observations               Pain   no complaints of pain   remain pain free   Assess pain qshift and PRN    Skin   R 5th digit amp, L heel wound, sacrum wound   continue dressing changes, skin remain free of further breakdown  Assess skin qshift and PRN      Discharge Planning:  Discharge plan remains  undecided at this time. SW discussed in length the support pt may continue to require at discharge since she reports she is not physically able to lift him and has been calling son and son's father to come to the home and assist. Family edu on 6/3 for afternoon OT session, and 6/4  1pm-4pm with pt son and dtr. SW will confirm there are no barriers to discharge.   Team Discussion: Debility. Incontinent B/B with use of Purwick (prostate CA/radiation).  Occasional leg pain managed with PRN medications. Wound care plan in place. Daily weights. Bilateral Prevalon boots. RLE-TDWB. Family education today. Fatigues quickly and requires 3-5 min rest breaks between task.  Patient on target to meet rehab goals: no, will not meet goals by discharge.   *See Care Plan and progress notes for long and short-term goals.   Revisions to Treatment Plan:  Recheck labs Friday.  SNF placement. Teaching Needs: Medications, safety, self care, transfer training, skin care, etc.   Current Barriers to Discharge: Decreased caregiver support, Incontinence, Wound care, and Weight bearing restrictions  Possible Resolutions to Barriers: Family education, Purwick for incontinence of urine. Skin/wound education, order recommended DME if needed.      Medical Summary Current Status: LBM x2 last night- incontinent of B/B- Leg  pain- has condom cath - using purewick-  doing bedlevel/in room therapy- pees every 20 minutes or more often-  Barriers to Discharge: Complicated Wound;Self-care education;Volume Overload;Medical stability;Incontinence;Weight bearing restrictions  Barriers to Discharge Comments: limited by: stage II on buttocks and stage I- and DTI L heel- and TDWB RLE- B/L prevalon boots, daily weights- family education this afternoon, but think he will be SNF-pees every 15-20 minutes- Possible Resolutions to [employer] Focus: fatigues so quickly- - RLE VAC off- working on getting to SNF likely- family ed  today-   Continued Need for Acute Rehabilitation Level of Care: The patient requires daily medical management by a physician with specialized training in physical medicine and rehabilitation for the following reasons: Direction of a multidisciplinary physical rehabilitation program to maximize functional independence : Yes Medical management of patient stability for increased activity during participation in an intensive rehabilitation regime.: Yes Analysis of laboratory values and/or radiology reports with any subsequent need for medication adjustment and/or medical intervention. : Yes   I attest that I was present, lead the team conference, and concur with the assessment and plan of the team.   Jearld Adjutant 09/21/2022, 4:02 PM

## 2022-09-21 NOTE — Progress Notes (Signed)
Physical Therapy Session Note  Patient Details  Name: Jesse KREBS Sr. MRN: [id number] Date of Birth: 09-Sep-1928  Today's Date: 09/21/2022 PT Individual Time: 1400-1425 PT Individual Time Calculation (min): 25 min   Short Term Goals: Week 2:  PT Short Term Goal 1 (Week 2): Pt will consistently perform STS with RW and +1 assist PT Short Term Goal 2 (Week 2): Pt will attempt a stand pivot transfer PT Short Term Goal 3 (Week 2): Pt will propel w/c >300 ft  Skilled Therapeutic Interventions/Progress Updates:    On arrival, pt and family requested several minutes to discuss previous family ed and d/c plan. Pt missed 20 minutes for family conversation, and fatigue at end of session. Session focused on family education. Pt stood x 4 and performed standing marching for endurance with RLE only, mod A to stand but shaky and often needs to sit quickly. Discussed pt's condition and current care needs. CSW also in/out to discuss. Lengthy  discussion about discharge plan with pt and family. Pt remained seated in W/c at end of session with family present and needs in reach.   Therapy Documentation Precautions:  Precautions Precautions: Fall Precaution Comments: L heel, R buttocks pressure injuries; HoH Required Braces or Orthoses: Other Brace Other Brace: R post op shoe Restrictions Weight Bearing Restrictions: Yes RLE Weight Bearing: Touchdown weight bearing Other Position/Activity Restrictions: HOH General: PT Amount of Missed Time (min): 20 Minutes PT Missed Treatment Reason: Other (Comment) (family requesting time to discuss discharge plan before session)     Therapy/Group: Individual Therapy  [name] 09/21/2022, 4:06 PM

## 2022-09-21 NOTE — Progress Notes (Signed)
Occupational Therapy Session Note  Patient Details  Name: Jesse DENEKE Sr. MRN: [id number] Date of Birth: 10-20-1928  Today's Date: 09/21/2022 OT Individual Time: 1300-1340 OT Individual Time Calculation (min): 40 min    Short Term Goals: Week 2:  OT Short Term Goal 1 (Week 2): Patient will flex forward to reach to feet while seated in wheelchair with min assist OT Short Term Goal 2 (Week 2): Patient will transition from an elevated surface sitting to partial standing (as needed for dressing) with max assist OT Short Term Goal 3 (Week 2): In preparation for toileting, patient will complete squat pivot transfer with max assist OT Short Term Goal 4 (Week 2): Patient will don/doff upper body clothing with set up assistance and increased time.  Skilled Therapeutic Interventions/Progress Updates:    PT resting in w/c with son and daughter present. Reviewed pt's current assist level and progress since admission. Sit<>stand x 3 using RW with max A, followed by extended rest breaks. Discussed with pt and family that rehab journey will be lengthy and he will continue to required assistance during that period. All in attendance verbalized understanding. Pt remained seated in w/c with all needs within reach. Family present.   Therapy Documentation Precautions:  Precautions Precautions: Fall Precaution Comments: L heel, R buttocks pressure injuries; HoH Required Braces or Orthoses: Other Brace Other Brace: R post op shoe Restrictions Weight Bearing Restrictions: Yes RLE Weight Bearing: Touchdown weight bearing Other Position/Activity Restrictions: HOH  Pain:  Pt reports his legs were uncomfortable; repositioned in w/c with reported relief   Therapy/Group: Individual Therapy  [name] 09/21/2022, 1:41 PM

## 2022-09-21 NOTE — Progress Notes (Signed)
Occupational Therapy Session Note  Patient Details  Name: Jesse MONTFORD Sr. MRN: [id number] Date of Birth: 04/04/1929  Today's Date: 09/21/2022 OT Individual Time: 5176-1607 OT Individual Time Calculation (min): 55 min    Short Term Goals: Week 2:  OT Short Term Goal 1 (Week 2): Patient will flex forward to reach to feet while seated in wheelchair with min assist OT Short Term Goal 2 (Week 2): Patient will transition from an elevated surface sitting to partial standing (as needed for dressing) with max assist OT Short Term Goal 3 (Week 2): In preparation for toileting, patient will complete squat pivot transfer with max assist OT Short Term Goal 4 (Week 2): Patient will don/doff upper body clothing with set up assistance and increased time.  Skilled Therapeutic Interventions/Progress Updates:    Pt resting in bed upon arrival. Pt reported hip pain but relieved with repositioning. Pt's Pure Wick needed replacing and LPN completed task at bed level. Supine>sit EOB with max A. Sitting balance with supervision. Dependent for donning pants with sit<>stand from EOB with Stedy. Pt completed UB bathing/dressing seated in w//c with min A for donning shirt. Sit<>stand x 4 in [place]. Extended rest breaks between sit<>stand. Pt remained in w/c with all needs within reach.   Therapy Documentation Precautions:  Precautions Precautions: Fall Precaution Comments: L heel, R buttocks pressure injuries; HoH Required Braces or Orthoses: Other Brace Other Brace: R post op shoe Restrictions Weight Bearing Restrictions: Yes RLE Weight Bearing: Touchdown weight bearing Other Position/Activity Restrictions: HOH Pain:  Pt c/o hip pain (unrated); relieved with repositioning    Therapy/Group: Individual Therapy  [name] 09/21/2022, 8:59 AM

## 2022-09-21 NOTE — Progress Notes (Addendum)
Patient ID: Jesse Pollock Sr., male   DOB: Mar 07, 1929, 89 y.o.   MRN: [id number]  SW met with pt, pt dtr, and pt son to provide updates from team conference, and continued physical support he will require and requires more rehab therapies. Pt amenable to SNF placement.   SW provided pt son and dtr with SNF list ([email or web address]).  *SW sent out SNF list.   Cecile Sheerer, MSW, LCSWA Office: [phone] Cell: [phone] Fax: [phone]

## 2022-09-21 NOTE — [place] FL2 (Signed)
[place] MEDICAID FL2 LEVEL OF CARE FORM     IDENTIFICATION  Patient Name: [name]. Birthdate: [birth year] Sex: male Admission Date (Current Location): 09/10/2022  [facility] and [place] Number:  Producer, television/film/video and Address:  The . [facility], [address], [place], [place] [address]      Provider Number: [id number]  Attending Physician Name and Address:  Lovorn, Aundra Millet, MD  Relative Name and Phone Number:       Current Level of Care: Hospital Recommended Level of Care: Skilled Nursing Facility Prior Approval Number:    Date Approved/Denied:   PASRR Number: [id number]  Discharge Plan: SNF    Current Diagnoses: Patient Active Problem List   Diagnosis Date Noted   Chronic anticoagulation 09/17/2022   Anemia 09/16/2022   Constipation 09/16/2022   Debility 09/10/2022   PVD (peripheral vascular disease) (HCC) 09/01/2022   Osteomyelitis of fifth toe of right foot (HCC) 08/31/2022   Bacteremia 08/31/2022   Acute cystitis without hematuria 08/31/2022   Polymicrobial bacterial infection 08/26/2022   Fever of undetermined origin 08/25/2022   Lactic acidosis 08/25/2022   Generalized weakness 08/25/2022   Bronchitis 08/25/2022   Bilateral pleural effusion 08/25/2022   Right foot ulcer (HCC) 08/25/2022   Essential hypertension 08/25/2022   BPH (benign prostatic hyperplasia) 08/25/2022   Atrial fibrillation (HCC) 08/25/2022    Orientation RESPIRATION BLADDER Height & Weight     Self, Time, Situation, Place  Normal Incontinent (currenlty using purwick) Weight: 199 lb 1.2 oz (90.3 kg) Height:  5\' 7"  (170.2 cm)  BEHAVIORAL SYMPTOMS/MOOD NEUROLOGICAL BOWEL NUTRITION STATUS      Incontinent Diet (see d/c instructions)  AMBULATORY STATUS COMMUNICATION OF NEEDS Skin   Extensive Assist Verbally Other (Comment) (Stage I/II to buttocks; non pressure injury to R great toe; left foot- Paint left heel with betadine and cover with foam dressing. Use  prevalon boot whenever in bed.)                       Personal Care Assistance Level of Assistance  Bathing, Dressing Bathing Assistance: Maximum assistance Feeding assistance: Independent Dressing Assistance: Maximum assistance     Functional Limitations Info  Sight, Hearing, Speech Sight Info: Adequate Hearing Info: Impaired (HOH) Speech Info: Adequate    SPECIAL CARE FACTORS FREQUENCY  PT (By licensed PT), OT (By licensed OT)     PT Frequency: 5xs per week OT Frequency: 5xs per week            Contractures      Additional Factors Info  Code Status, Allergies Code Status Info: Full Allergies Info: See discharge instructions           Current Medications (09/21/2022):  This is the current hospital active medication list Current Facility-Administered Medications  Medication Dose Route Frequency Provider Last Rate Last Admin   acetaminophen (TYLENOL) tablet 325-650 mg  325-650 mg Oral Q4H PRN Love, Pamela S, PA-C       acetaminophen (TYLENOL) tablet 500 mg  500 mg Oral BID WC Lovorn, Megan, MD   500 mg at 09/21/22 1126   alum & mag hydroxide-simeth (MAALOX/MYLANTA) 200-200-20 MG/5ML suspension 30 mL  30 mL Oral Q4H PRN Love, Pamela S, PA-C       amoxicillin-clavulanate (AUGMENTIN) 875-125 MG per tablet 1 tablet  1 tablet Oral Q12H Pham, Minh Q, RPH-CPP   1 tablet at 09/21/22 0939   apixaban (ELIQUIS) tablet 5 mg  5 mg Oral BID Love,  Evlyn Kanner, PA-C   5 mg at 09/21/22 2956   ascorbic acid (VITAMIN C) tablet 1,000 mg  1,000 mg Oral Daily Jacquelynn Cree, PA-C   1,000 mg at 09/21/22 2130   aspirin EC tablet 81 mg  81 mg Oral Daily Jacquelynn Cree, PA-C   81 mg at 09/21/22 8657   atorvastatin (LIPITOR) tablet 40 mg  40 mg Oral Daily Jacquelynn Cree, PA-C   40 mg at 09/21/22 8469   bisacodyl (DULCOLAX) suppository 10 mg  10 mg Rectal Daily PRN Lovorn, Aundra Millet, MD       cyanocobalamin (VITAMIN B12) injection 1,000 mcg  1,000 mcg Intramuscular Daily Jacquelynn Cree, PA-C   1,000  mcg at 09/21/22 6295   diphenhydrAMINE (BENADRYL) capsule 25 mg  25 mg Oral Q6H PRN Love, Pamela S, PA-C       doxycycline (VIBRA-TABS) tablet 100 mg  100 mg Oral Q12H Pham, Minh Q, RPH-CPP   100 mg at 09/21/22 2841   EPINEPHrine (EPI-PEN) injection 0.3 mg  0.3 mg Intramuscular Once PRN Love, Pamela S, PA-C       guaiFENesin-dextromethorphan (ROBITUSSIN DM) 100-10 MG/5ML syrup 5-10 mL  5-10 mL Oral Q6H PRN Love, Pamela S, PA-C       ipratropium-albuterol (DUONEB) 0.5-2.5 (3) MG/3ML nebulizer solution 3 mL  3 mL Nebulization Q4H PRN Love, Pamela S, PA-C       lactose free nutrition (BOOST PLUS) liquid 237 mL  237 mL Oral BID BM Lovorn, Megan, MD   237 mL at 09/19/22 1303   multivitamin with minerals tablet 1 tablet  1 tablet Oral Daily Jacquelynn Cree, PA-C   1 tablet at 09/21/22 3244   nutrition supplement (JUVEN) (JUVEN) powder packet 1 packet  1 packet Oral BID BM Love, Evlyn Kanner, PA-C   1 packet at 09/19/22 1303   omega-3 acid ethyl esters (LOVAZA) capsule 1 g  1 g Oral Daily Jacquelynn Cree, PA-C   1 g at 09/21/22 0102   oxyCODONE (Oxy IR/ROXICODONE) immediate release tablet 10-15 mg  10-15 mg Oral Q4H PRN Love, Pamela S, PA-C       pantoprazole (PROTONIX) EC tablet 40 mg  40 mg Oral BID Love, Pamela S, PA-C   40 mg at 09/21/22 0939   phenol (CHLORASEPTIC) mouth spray 1 spray  1 spray Mouth/Throat PRN Love, Pamela S, PA-C       potassium chloride SA (KLOR-CON M) CR tablet 20 mEq  20 mEq Oral Daily Ranelle Oyster, MD   20 mEq at 09/21/22 7253   prochlorperazine (COMPAZINE) tablet 5-10 mg  5-10 mg Oral Q6H PRN Delle Reining S, PA-C       Or   prochlorperazine (COMPAZINE) suppository 12.5 mg  12.5 mg Rectal Q6H PRN Love, Pamela S, PA-C       Or   prochlorperazine (COMPAZINE) injection 5-10 mg  5-10 mg Intramuscular Q6H PRN Love, Pamela S, PA-C       senna-docusate (Senokot-S) tablet 1 tablet  1 tablet Oral BID Jacquelynn Cree, PA-C   1 tablet at 09/21/22 6644   sodium phosphate (FLEET) 7-19  GM/118ML enema 1 enema  1 enema Rectal Once PRN Love, Pamela S, PA-C       tamsulosin [facility]) capsule 0.4 mg  0.4 mg Oral QPC supper Love, Pamela S, PA-C   0.4 mg at 09/20/22 1843     Discharge Medications: Please see discharge summary for a list of discharge medications.  Relevant Imaging Results:  Relevant Lab Results:   Additional Information SSN: [id number]  Jad Johansson A Lula Olszewski, LCSW

## 2022-09-22 ENCOUNTER — Telehealth: Payer: Self-pay

## 2022-09-22 DIAGNOSIS — R7989 Other specified abnormal findings of blood chemistry: Secondary | ICD-10-CM | POA: Insufficient documentation

## 2022-09-22 DIAGNOSIS — C61 Malignant neoplasm of prostate: Secondary | ICD-10-CM | POA: Insufficient documentation

## 2022-09-22 LAB — BASIC METABOLIC PANEL
Anion gap: 10 (ref 5–15)
BUN: 25 mg/dL — ABNORMAL HIGH (ref 8–23)
CO2: 23 mmol/L (ref 22–32)
Calcium: 9.2 mg/dL (ref 8.9–10.3)
Chloride: 103 mmol/L (ref 98–111)
Creatinine, Ser: 0.95 mg/dL (ref 0.61–1.24)
GFR, Estimated: >60 mL/min (ref >60)
Glucose, Bld: 87 mg/dL (ref 70–99)
Potassium: 5.2 mmol/L — ABNORMAL HIGH (ref 3.5–5.1)
Sodium: 136 mmol/L (ref 135–145)

## 2022-09-22 LAB — BRAIN NATRIURETIC PEPTIDE: B Natriuretic Peptide: 196.2 pg/mL — ABNORMAL HIGH (ref 0.0–100.0)

## 2022-09-22 MED ORDER — SODIUM ZIRCONIUM CYCLOSILICATE 5 G PO PACK: 5.0000 g | PACK | Freq: Once | ORAL | Status: DC

## 2022-09-22 MED ORDER — ACETAMINOPHEN 500 MG PO TABS: 500.0000 mg | ORAL_TABLET | Freq: Two times a day (BID) | ORAL | 0 refills | Status: DC

## 2022-09-22 MED ORDER — FUROSEMIDE 20 MG PO TABS
20.0000 mg | ORAL_TABLET | Freq: Every day | ORAL | Status: DC
  Administered 2022-09-22 – 2022-09-24 (×3): 20 mg via ORAL
  Filled 2022-09-22 (×3): qty 1

## 2022-09-22 NOTE — Progress Notes (Signed)
PROGRESS NOTE   Subjective/Complaints:   Pt reports no issues- Admits to poor endurance LBM yesterday   ROS:    Pt denies SOB, abd pain, CP, N/V/C/D, and vision changes   Except for HPI Objective:   No results found. Recent Labs    09/20/22 0638  WBC 7.0  HGB 10.3*  HCT 31.2*  PLT 315    Recent Labs    09/20/22 0638  NA 136  K 4.7  CL 108  CO2 23  GLUCOSE 95  BUN 32*  CREATININE 0.95  CALCIUM 8.4*    Intake/Output Summary (Last 24 hours) at 09/22/2022 0914 Last data filed at 09/22/2022 4098 Gross per 24 hour  Intake 720 ml  Output --  Net 720 ml     Pressure Injury 08/25/22 Heel Left Deep Tissue Pressure Injury - Purple or maroon localized area of discolored intact skin or blood-filled blister due to damage of underlying soft tissue from pressure and/or shear. 6.5cm x 5.5cm (Active)  08/25/22 0730  Location: Heel  Location Orientation: Left  Staging: Deep Tissue Pressure Injury - Purple or maroon localized area of discolored intact skin or blood-filled blister due to damage of underlying soft tissue from pressure and/or shear.  Wound Description (Comments): 6.5cm x 5.5cm  Present on Admission: Yes     Pressure Injury 08/25/22 Buttocks Right Stage 2 -  Partial thickness loss of dermis presenting as a shallow open injury with a red, pink wound bed without slough. (Active)  08/25/22   Location: Buttocks  Location Orientation: Right  Staging: Stage 2 -  Partial thickness loss of dermis presenting as a shallow open injury with a red, pink wound bed without slough.  Wound Description (Comments):   Present on Admission: Yes     Pressure Injury 09/10/22 Sacrum Medial Stage 2 -  Partial thickness loss of dermis presenting as a shallow open injury with a red, pink wound bed without slough. Elongated area over sacrum (Active)  09/10/22 1912  Location: Sacrum  Location Orientation: Medial  Staging: Stage 2  -  Partial thickness loss of dermis presenting as a shallow open injury with a red, pink wound bed without slough.  Wound Description (Comments): Elongated area over sacrum  Present on Admission: Yes    Physical Exam: Vital Signs Blood pressure (!) 145/62, pulse 61, temperature 98.4 F (36.9 C), temperature source Oral, resp. rate 18, height 5\' 7"  (1.702 m), weight 90.3 kg, SpO2 100 %.          General: awake, alert, appropriate, sitting up in bed;  NAD HENT: conjugate gaze; oropharynx moist CV: regular rate and rhythm; no JVD Pulmonary: CTA B/L; no W/R/R- good air movement GI: soft, NT, ND, (+)BS Psychiatric: appropriate Neurological: Ox3  Extremities: RLE>LLE swelling- pretty stable 2+ LE edema- not as tight as was Neurologic: Fairly alert. Oriented to person, place, month/year. Cranial nerves II through XII intact, motor strength is 5/5 in bilateral deltoid, bicep, tricep, grip, 3 - bilateral hip flexor, 4/5 knee extensors, ankle dorsiflexor and plantar flexor Sensory exam normal sensation to light touchin bilateral lower extremities  Musculoskeletal:   No joint swelling. Left hip a little tender with palpation laterally,  PROM  Assessment/Plan: 1. Functional deficits which require 3+ hours per day of interdisciplinary therapy in a comprehensive inpatient rehab setting. Physiatrist is providing close team supervision and 24 hour management of active medical problems listed below. Physiatrist and rehab team continue to assess barriers to discharge/monitor patient progress toward functional and medical goals  Care Tool:  Bathing    Body parts bathed by patient: Right arm, Left arm, Chest, Abdomen, Right upper leg, Left upper leg, Face   Body parts bathed by helper: Buttocks, Right lower leg, Left lower leg Body parts n/a: Front perineal area   Bathing assist Assist Level: Moderate Assistance - Patient 50 - 74%     Upper Body Dressing/Undressing Upper body dressing    What is the patient wearing?: Pull over shirt    Upper body assist Assist Level: Minimal Assistance - Patient > 75%    Lower Body Dressing/Undressing Lower body dressing      What is the patient wearing?: Pants, Incontinence brief     Lower body assist Assist for lower body dressing: Dependent - Patient 0%     Toileting Toileting    Toileting assist Assist for toileting: 2 Helpers     Transfers Chair/bed transfer  Transfers assist     Chair/bed transfer assist level: 2 Helpers     Locomotion Ambulation   Ambulation assist   Ambulation activity did not occur: Safety/medical concerns          Walk 10 feet activity   Assist  Walk 10 feet activity did not occur: Safety/medical concerns        Walk 50 feet activity   Assist Walk 50 feet with 2 turns activity did not occur: Safety/medical concerns         Walk 150 feet activity   Assist Walk 150 feet activity did not occur: Safety/medical concerns         Walk 10 feet on uneven surface  activity   Assist Walk 10 feet on uneven surfaces activity did not occur: Safety/medical concerns         Wheelchair     Assist Is the patient using a wheelchair?: Yes (Pt left in w/c but did not attempt propulsion or dependent transport.) Type of Wheelchair: Manual           Wheelchair 50 feet with 2 turns activity    Assist    Wheelchair 50 feet with 2 turns activity did not occur: Safety/medical concerns       Wheelchair 150 feet activity     Assist  Wheelchair 150 feet activity did not occur: Safety/medical concerns       Blood pressure (!) 145/62, pulse 61, temperature 98.4 F (36.9 C), temperature source Oral, resp. rate 18, height 5\' 7"  (1.702 m), weight 90.3 kg, SpO2 100 %.  Medical Problem List and Plan: 1. Functional deficits secondary to debility related to sepsis from Right 5th MT osteomyelitis and other medical complications. Pt is s/p right 5th ray  amputation 5/22.             -TDWB RLE             -patient may not yet shower due to [facility]             -ELOS/Goals: 21-24 days,  Pt's goals mod A- daughter cannot provide this- looking at possible SNF -Continue CIR therapies including PT, OT. pt doesn't want SNF, so daughter trying to see if can take home? Con't CIR PT and OT- team  conference today to f/u on progress 2.  Antithrombotics: -DVT/anticoagulation:  Pharmaceutical: Eliquis             -antiplatelet therapy: ASA 81mg  daily 3. Pain Management:  Oxycodone prn. 5/27- has very little pain- mainly stinging- con't mainly tylenol prn   6/3- pain controlled- mild-  4. Mood/Behavior/Sleep: LCSW to follow for evaluation and support.              -antipsychotic agents: N/A             -Melatonin prn for insomnia.  5. Neuropsych/cognition: This patient is capable of making decisions on his own behalf. 6. Skin/Wound Care: Continue wound VAC for now.              -pressure relief and foam dressings to left heel, buttocks wounds. Medihoney with gauze   under foam dressing for left heel..  5/27- will need to check when [facility] can be removed on R foot. - Dr Lajoyce Corners is surgeon  5/28- will d/w Rinaldo Cloud to see if can check when can be removed- surgery was 5/22- so been 6 days?  5/29- can get removed 5/29- 6/2  Vac off.  Continue local dressings 7. Fluids/Electrolytes/Nutrition: Strict I/O w/daily weights             --Add  boost to help w/protein stores and promote healing.              --On zinc and Vitamin C 8. Bacteremia with osteomyelitis: Completed IV antibiotics and s/p Ray amputation             --Mop tx with Augmentin/doxycycline through June 5 9. Hyponatremia/pre-renal azotemia: Na improving form 131-->134. BUN with rise to 32.             --encourage fluid intake and should improve off Lasix.  --hyponatremia resolved off HCTZ. 5/27- BUN still 29- if rises above 30, will give IVFs- due to hx of fluid overload, will not add IVFs before  then 5/28- will recheck Thursday 5/30- BUN up to 38- but Weight up 4 kg- will try giving Lasix due to fluid overload- in tissues-  and if not better, will call IM 5/31- Bun 45- and Cr 1.14- will need some IVFs, and hopefully won't third space- will give 75cc/hour x 12 hours and recheck in AM 6/1 BUN sl improved but Cr up to 1.27. Potassium now low  -nurse gave PRN lasix this morning after running ivf  -dc prn lasix dose    -resume IVF @50cc /hr  6/2 labs improved today---  -edema unchanged  -will dc IVF  -continue Kdur supplement  -recheck BMET tomorrow 6/3- Cr 0.95 and BUN 32- a little dry-  6/4- will recheck Thursday 10. ABLA/B12 deficiency: Continue to monitor. Has high ferritin level with MCV 104. -daily B12 injections until discharge from rehab -transfuse for hgb <7 5/27- Hb stable at 7.7- will monitor trends  5/31- Transfused yesterday for Hb of 7.1- called GI- fecal occult was negative x2- so per GI, not need for Endoscopy- 11. Vitamin B 12 deficiency: Vitam in B 12< 50 with recheck after supplementation > 7,500 12. Fluid overload: Daily wts with strict I/O. Monitor for signs of overload.  --has been on IV lasix 20 mg since 05/08 w/recs to change to to prn. -will change lasix to 20mg  daily prn and monitor weights daily.  --will add low salt diet.  5/27- will add Daily weights- and monitor trend 5/29- d/w nursing about getting pt weighed again.  5/30- weight up 4 kg- wil give Lasix 40 mg x1 and monitor weight if needs to give more tomorrow.  5/31- weight down to 84.7 kg today- down 5 kg- and now dry- will give 12 hours of 75cc/hour of IVFs Filed Weights   09/19/22 0500 09/20/22 0500 09/21/22 0500  Weight: 90.1 kg 91 kg 90.3 kg   6/1 back to prior weight. Legs look ok. They have 1+ edema at best. No sob, pt feels well. Recent ECHO with 70% EF, Heart is in reg rhythm  -IVF at 50cc/hr  -recheck bmet, weights, etc tomorrow 6/2 Weight up. Edema unchanged. No sob, chest clear on  exam, I/O's negative 3.6L  -dc IVF  -continue to monitor clinically.  6/3- BUN still up at 32- but bettter than 45 on Friday  6/4- will recheck Thursday- BMP and weight stalbe last 3 days 6/5- weight much higher/worse since 1 week ago, but before got here, changed bed- so hard to know what caused it- overall, still too high, but BUN 32- so concerned about making too dry.  13. Prostate cancer             -monitor voiding patterns             -flomax             -f/u as outpt with Dr. Retta Diones  5/28- s/p radiation for prostate CA- likely cause for Incontinence 14. New onset A fib:             -rate is controlled             -transitioned to eliquis by cardiology, continues on ASA as well  5/27- HR in 50s-60s- sounds like still in some Afib- con't regimen  6/2 pt regular rhythm today. May go in and out  6/4- on nothing to reduce HR- will con't to monitor  15. Urinary incontinence  5/27- will stop Purewick for now- put urinal where pt could reach- if has a lot of incontinence due to prostate CA- might need purewick at night- will see trends. 5/28- having urinary incontinence per pt- had multiple rounds of radiation for prostate CA- likely cause for urinary incontinence-  will see if can do purewick at night- and d/w team   5/29- d/w nursing- can do condom cath, however pt really wants to try urinal- so will not place condom cath at this time. 6/1 continue condom cath 6/4- using condomc ath and purewick- will con't 16. Constipation  5/29- will give sorbitol 30cc and if doesn't work, will give soap suds enema  5/30- LBM last night after soap suds enema- large  5/31- was disimpacted of 2 extra large Bms and continued to have BM's- Abdomen much less distended- feels better  6/2 moved bowels 6/1  6/3- LBM x2 yesterday   I spent a total of 35   minutes on total care today- >50% coordination of care- due to d/w PA- we discussed the need for IM consult- to give albumin and Lasix to diurese since  weight up, but trying to balance BUN and dry vs increased weight.    LOS: 12 days A FACE TO FACE EVALUATION WAS PERFORMED  [name] 09/22/2022, 9:14 AM

## 2022-09-22 NOTE — Progress Notes (Signed)
Will start patient on lasix as per cards input and BUN improved. BNP- Patient with elevated K+ due to kdur on board-->will d/c. Recheck labs in am.

## 2022-09-22 NOTE — Progress Notes (Signed)
Physical Therapy Session Note  Patient Details  Name: Jesse NORMILE Sr. MRN: [id number] Date of Birth: April 04, 1929  Today's Date: 09/22/2022 PT Individual Time: 4098-1191 PT Individual Time Calculation (min): 53 min   Short Term Goals: Week 2:  PT Short Term Goal 1 (Week 2): Pt will consistently perform STS with RW and +1 assist PT Short Term Goal 2 (Week 2): Pt will attempt a stand pivot transfer PT Short Term Goal 3 (Week 2): Pt will propel w/c >300 ft  Skilled Therapeutic Interventions/Progress Updates: Pt presented in w/c agreeable to therapy. Pt states some mild R foot and hip pain but no pain interventions requested during session. Session focused on general conditioning and BLE strengthening. Pt voiced concerns regarding SNF placement with therapist providing active listening and advising pt that family will have say in location of SNF (he wishes to remain close to home/family). Pt initiated with BLE therex prior to standing attempts. Performed LAQ 2 x 10, hamstring pulls with red theraband 2 x 10, seated hip flexion WLP to fatigue, anf hip ER with red theraband 2 x 10.  Pt then worked on Sit to stand from w/c, performed x 3 with extended rest breaks between bouts due to increased dyspnea. Pt required modA on first stand with PTA providing cues for increased anterior weight shifting. On second stand pt with improved technique requiring mod but decreased assistance. On third stand pt required modA however was able to complete x 10 standing marches. Pt with noted fatigue after third attempt, however PTA explained that pt was unable to complete marching task a week ago and is slowly demosntrating improvement thus pt more encouraged. Pt requesting to remain in w/c at end of session and was left with belt alarm on, call bell within reach and needs met.      Therapy Documentation Precautions:  Precautions Precautions: Fall Precaution Comments: L heel, R buttocks pressure injuries; HoH Required  Braces or Orthoses: Other Brace Other Brace: R post op shoe Restrictions Weight Bearing Restrictions: Yes RLE Weight Bearing: Touchdown weight bearing Other Position/Activity Restrictions: HOH General:   Vital Signs: Therapy Vitals Temp: 98 F (36.7 C) Pulse Rate: 64 Resp: 17 BP: (!) 147/58 Patient Position (if appropriate): Sitting Oxygen Therapy SpO2: 100 % O2 Device: Room Air Pain:   Mobility:   Locomotion :    Trunk/Postural Assessment :    Balance:   Exercises:   Other Treatments:      Therapy/Group: Individual Therapy  Katilyn Miltenberger 09/22/2022, 3:46 PM

## 2022-09-22 NOTE — Progress Notes (Addendum)
Physical Therapy Session Note  Patient Details  Name: [name]. MRN: [id number] Date of Birth: 10/14/1928  Today's Date: 09/22/2022 PT Individual Time: 1004-1102 PT Individual Time Calculation (min): 58 min   Short Term Goals: Week 2:  PT Short Term Goal 1 (Week 2): Pt will consistently perform STS with RW and +1 assist PT Short Term Goal 2 (Week 2): Pt will attempt a stand pivot transfer PT Short Term Goal 3 (Week 2): Pt will propel w/c >300 ft  Skilled Therapeutic Interventions/Progress Updates:    Patient received resting in WC. Agreeable to therapy sesion and pt denies pain at start, monitored throughout session.  Seated Therapeutic Exercise in WC: - LAQ 2x10 - Marching 2x10  - Hamstring Curl 2x10 w/ yellow theraband for resistance - Ankle pumps 2x10  Sit<>stand completed in [place] with Mod Assist; cues to maintain TDWB on Rt LE. Pt required mod assist to initiate power up and weight shift to Lt to maintain precautions. Pt completed 5x sit<>stand with extended seated rest break between each rep. As pt fatigued greater assist required to initiate power up.  Seated Therapeutic Exercise in WC: - triceps press up from [facility] for UE strengthening, 4x5 reps with seated rest between blocking Lt foot to keep knee flexed for support and to facilitate anterior trunk lean. Pt improved use of Ue's on each set and greater hip/buttock clearance from [facility] with mod progressing to min assist to facilitate lift.   2x Sit<>Stand with Mod Assist and Rt LE march in standing, pt marched Rt foot <1" off the floor, Max assist to power up and to steady balance with marches. Pt completed 10x march on Rt LE each stand.   EOS pt agreeable to remain in [facility], brakes locked, Alarm on and call bell within reach, and all needs met. Family visiting at bedside during and at end of session.  Therapy Documentation Precautions:  Precautions Precautions: Fall Precaution Comments: L heel, R buttocks pressure injuries;  HoH Required Braces or Orthoses: Other Brace Other Brace: R post op shoe Restrictions Weight Bearing Restrictions: Yes RLE Weight Bearing: Touchdown weight bearing Other Position/Activity Restrictions: HOH  Pain:  Pt denies pain at start, monitored throughout session.  Therapy/Group: Individual Therapy  Wynn Maudlin, DPT Acute Rehabilitation Services Office [phone]  09/22/22 12:52 PM

## 2022-09-22 NOTE — Telephone Encounter (Signed)
[facility] Concerned about patient weight gain and edema. Would like a call back. Contact Delle Reining [phone] .

## 2022-09-22 NOTE — Progress Notes (Signed)
Occupational Therapy Session Note  Patient Details  Name: Jesse BARKMAN Sr. MRN: [id number] Date of Birth: [birth year]  Today's Date: 09/22/2022 OT Individual Time: 4098-1191 OT Individual Time Calculation (min): 70 min    Short Term Goals: Week 2:  OT Short Term Goal 1 (Week 2): Patient will flex forward to reach to feet while seated in wheelchair with min assist OT Short Term Goal 2 (Week 2): Patient will transition from an elevated surface sitting to partial standing (as needed for dressing) with max assist OT Short Term Goal 3 (Week 2): In preparation for toileting, patient will complete squat pivot transfer with max assist OT Short Term Goal 4 (Week 2): Patient will don/doff upper body clothing with set up assistance and increased time.  Skilled Therapeutic Interventions/Progress Updates:    OT intervention with focus on bed mobility, sit<>stand, standing balance, BUE therex, and activity tolerance to increase independence with BADLs. Supine>sit EOB with HOB elevated. Pt requires assistance moving RLE to EOB and pushing up to upright position EOB. Over all Mod A. Pt able to scoot to EOB without assistance. Sitting balance with supervision. Pt declined changing pants or shirt this morning, stating "they washed me up last night." Initial sit<>stand in [place] with mod A for transfer to w/c. Pt completed grooming at sink and stated that he did want to change his shirt. Sit<>stand with RW x 3 with max A fading to mod A with extended rest breaks. Standing balance at sink with min A and max verbal cues for upright posture. BUE therex with 4# bar-circuit exercises 3x10 reaching up and out. Pt remained seated in w/c with all needs within reach. Pure Wick in place.  Therapy Documentation Precautions:  Precautions Precautions: Fall Precaution Comments: L heel, R buttocks pressure injuries; HoH Required Braces or Orthoses: Other Brace Other Brace: R post op shoe Restrictions Weight Bearing  Restrictions: Yes RLE Weight Bearing: Touchdown weight bearing Other Position/Activity Restrictions: HOH  Pain:  Pt denies pain this morning   Therapy/Group: Individual Therapy  [name] 09/22/2022, 9:30 AM

## 2022-09-22 NOTE — Telephone Encounter (Signed)
I have only seen this patient once for Afib. He does not have known diagnosis of heart failure. Weight gain could be due to number of reasons. I think it is reasonable to use PO lasix 20 mg daily for now. Consider outpatient f/u w/me.  Thanks MJP

## 2022-09-23 DIAGNOSIS — E871 Hypo-osmolality and hyponatremia: Secondary | ICD-10-CM | POA: Insufficient documentation

## 2022-09-23 DIAGNOSIS — R600 Localized edema: Secondary | ICD-10-CM | POA: Insufficient documentation

## 2022-09-23 LAB — BASIC METABOLIC PANEL
Anion gap: 6 (ref 5–15)
BUN: 26 mg/dL — ABNORMAL HIGH (ref 8–23)
CO2: 21 mmol/L — ABNORMAL LOW (ref 22–32)
Calcium: 8.8 mg/dL — ABNORMAL LOW (ref 8.9–10.3)
Chloride: 106 mmol/L (ref 98–111)
Creatinine, Ser: 0.92 mg/dL (ref 0.61–1.24)
GFR, Estimated: >60 mL/min (ref >60)
Glucose, Bld: 96 mg/dL (ref 70–99)
Potassium: 4.8 mmol/L (ref 3.5–5.1)
Sodium: 133 mmol/L — ABNORMAL LOW (ref 135–145)

## 2022-09-23 MED ORDER — PANTOPRAZOLE SODIUM 40 MG PO TBEC: 40.0000 mg | DELAYED_RELEASE_TABLET | Freq: Two times a day (BID) | ORAL | Status: AC

## 2022-09-23 MED ORDER — FUROSEMIDE 20 MG PO TABS
20.0000 mg | ORAL_TABLET | Freq: Every day | ORAL | Status: AC
Start: 2022-09-23 — End: ?

## 2022-09-23 NOTE — Progress Notes (Signed)
Physical Therapy Session Note  Patient Details  Name: Jesse SUIRE Sr. MRN: [id number] Date of Birth: 1929-02-19  Today's Date: 09/23/2022 PT Individual Time: 1347-1440 PT Individual Time Calculation (min): 53 min   Short Term Goals: Week 2:  PT Short Term Goal 1 (Week 2): Pt will consistently perform STS with RW and +1 assist PT Short Term Goal 2 (Week 2): Pt will attempt a stand pivot transfer PT Short Term Goal 3 (Week 2): Pt will propel w/c >300 ft  Skilled Therapeutic Interventions/Progress Updates: Pt presented in w/c agreeable to therapy. Pt states some mild pain in R foot, did not rate and did not request intervention during session. PTA noted that suction seemed to be louder than normal indicating poor suctioning. Pt performed stand in [place] with minA and PTA checked tubing which was intact but noted that there appeared to be leakage as pt's brief was extensively soiled. Pt returned to w/c and nsg notified as PTA obtained supplies to change brief. Nsg arrived and pt transferred to bed as nsg initially indicated would change Purewick. At EOB pt transferred to supine with modA primarily for BLE management. Pt performed rolling minA for peri-care and brief change, nsg notified that new Purewick not avail and awaiting new one, however nsg changed Mepilex. Once completed pt transferred to sitting EOB with minA and transferred back to w/c via [place] with minA. Pt then propelled to day room with supervision for general conditioning and participated in Cybex Kinetron 70cm/sec x 4 min for BLE strengthening and conditioning. Pt then propelled back to room in same manner as prior and performed Sit to stand with modA then performed backwards walking several steps towards w/c with minA, increased time, and cues to push through heel. Pt returned to w/c and remained in w/c at end of session with belt alarm on, call bell within reach and needs met.      Therapy Documentation Precautions:   Precautions Precautions: Fall Precaution Comments: L heel, R buttocks pressure injuries; HoH Required Braces or Orthoses: Other Brace Other Brace: R post op shoe Restrictions Weight Bearing Restrictions: No RLE Weight Bearing: Touchdown weight bearing Other Position/Activity Restrictions: HOH General:   Vital Signs: Therapy Vitals Temp: 97.6 F (36.4 C) Temp Source: Oral Pulse Rate: 60 Resp: 18 BP: (!) 161/70 Patient Position (if appropriate): Sitting Oxygen Therapy SpO2: 99 % O2 Device: Room Air Pain:   Mobility:   Locomotion :    Trunk/Postural Assessment :    Balance:   Exercises:   Other Treatments:      Therapy/Group: Individual Therapy  Liliya Fullenwider 09/23/2022, 2:44 PM

## 2022-09-23 NOTE — Telephone Encounter (Signed)
Spoke with nurse. She verbalized understanding

## 2022-09-23 NOTE — Progress Notes (Signed)
Incision is clean and dry with dime size dry bloody drainage on Aquacel. Sutures intact but gaping in a few places due to 2+ pedal edema. Reached out to Dr. Lajoyce Corners who recommends follow up in office next week for input on suture removal.

## 2022-09-23 NOTE — Progress Notes (Signed)
Initial Nutrition Assessment  DOCUMENTATION CODES:   Obesity unspecified  INTERVENTION:  - Discontinue Boost Plus to BID.   - Discontinue Juven BID, each packet provides 95 calories, 2.5 grams of protein (collagen), and 9.8 grams of carbohydrate (3 grams sugar); also contains 7 grams of L-arginine and L-glutamine, 300 mg vitamin C, 15 mg vitamin E, 1.2 mcg vitamin B-12, 9.5 mg zinc, 200 mg calcium, and 1.5 g  Calcium Beta-hydroxy-Beta-methylbutyrate to support wound healing  NUTRITION DIAGNOSIS:   Increased nutrient needs related to wound healing as evidenced by estimated needs.  GOAL:   Patient will meet greater than or equal to 90% of their needs  MONITOR:   PO intake  REASON FOR ASSESSMENT:   Other (Comment) (Pressure Injury Screen)    ASSESSMENT:   89 y.o. male admits to CIR related to functional deficits related to debility secondary to Sepsis. PMH includes: HTN, kidney calculus, pneumonia, prostate cancer, SOB.  Meds reviewed: Vit C, lipitor, Vit B12, colace, lasix, MVI, Juven, lovaza, senokot.  Labs reviewed: K high, BUN elevated (improving).   Pt ate 80% of his breakfast this am. Per record, pt has eaten mostly 75-100% of his meals over the past 7 days. The pt is meeting his needs at this time. Per meds hx record, pt has not been accepting Juven and not much of the Boost supplements. Will discontinue these supplements for now. RD will continue to monitor PO intakes.   NUTRITION - FOCUSED PHYSICAL EXAM:  Assess at f/u.   Diet Order:   Diet Order             Diet Heart Room service appropriate? Yes; Fluid consistency: Thin  Diet effective now                   EDUCATION NEEDS:   Not appropriate for education at this time  Skin:  Skin Assessment: Skin Integrity Issues: Skin Integrity Issues:: Incisions, DTI, Stage II DTI: L heel Stage II: mid sacrum; R buttocks Incisions: R foot  Last BM:  6/5 - type 5  Height:   Ht Readings from Last 1  Encounters:  09/10/22 5\' 7"  (1.702 m)    Weight:   Wt Readings from Last 1 Encounters:  09/23/22 88.5 kg    Ideal Body Weight:     BMI:  Body mass index is 30.56 kg/m.  Estimated Nutritional Needs:   Kcal:  2200-2400 kcals  Protein:  110-120 gm  Fluid:  >/= 2 L  Bethann Humble, RD, LDN, CNSC.

## 2022-09-23 NOTE — Progress Notes (Signed)
Patient ID: Jesse Pollock Sr., male   DOB: 31-Dec-1928, 89 y.o.   MRN: [id number]  1) [facility]- declined 2) [facility], and 3) [facility]. Intends to go look at more facilities today as she would like for her father to be close to home. Reports she will follow-up with other preferred locations.   SW spoke with Destiny/Admissions with [name] to discuss referral. Reports she will send referral to business office and will follow-up if able to accept.   Cecile Sheerer, MSW, LCSWA Office: [phone] Cell: [phone] Fax: [phone]

## 2022-09-23 NOTE — Progress Notes (Signed)
Occupational Therapy Session Note  Patient Details  Name: Jesse GAIDA Sr. MRN: [id number] Date of Birth: February 16, 1929  Today's Date: 09/23/2022 OT Individual Time: 4098-1191 OT Individual Time Calculation (min): 70 min    Short Term Goals: Week 2:  OT Short Term Goal 1 (Week 2): Patient will flex forward to reach to feet while seated in wheelchair with min assist OT Short Term Goal 2 (Week 2): Patient will transition from an elevated surface sitting to partial standing (as needed for dressing) with max assist OT Short Term Goal 3 (Week 2): In preparation for toileting, patient will complete squat pivot transfer with max assist OT Short Term Goal 4 (Week 2): Patient will don/doff upper body clothing with set up assistance and increased time.  Skilled Therapeutic Interventions/Progress Updates:    Pt resting in bed upon arrival. OT intervention with focus on bed mobility, unsupported sitting balance, sit<>stand in [place], UB bathing/dressing, BUE strengthening, and activity tolerance to increase independence with BADLs. Supine>sit EOB with max A this morning with increased difficulty moving BLE off EOB. Pt able to scoot to EOB with supervision. Pt required assistance threading BLE into pants and pulling pants over hips when standing in [place]. Sit<>stand in [place] with mod A. Pt completed UB bathing/dressing seated in w/c at ink. Min A for donning pull over shirt. Pt required multiple rest breaks during session. Pt fatigues quickly. Pt remained in w/c with all needs within reach.   Therapy Documentation Precautions:  Precautions Precautions: Fall Precaution Comments: L heel, R buttocks pressure injuries; HoH Required Braces or Orthoses: Other Brace Other Brace: R post op shoe Restrictions Weight Bearing Restrictions: No RLE Weight Bearing: Touchdown weight bearing Other Position/Activity Restrictions: HOH  Pain:  Pt reports his hips feel better this morning   Therapy/Group: Individual  Therapy  [name] 09/23/2022, 9:29 AM

## 2022-09-23 NOTE — Progress Notes (Incomplete)
Patient ID: Jesse Pollock Sr., male   DOB: 1928-12-01, 89 y.o.   MRN: [id number]  1) [facility]- declined 2) [facility], and 3) [facility]. Intends to go look at more facilities today as she would like for her father to be close to home. Reports she will follow-up with other preferred locations.   SW spoke with Destiny/Admissions with [facility] to discuss referral. Reports she will send referral to business office and will follow-up if able to accept.  *declined  SW updated pt dtr Windell Moulding on above. Would like SW to explore [address], [address], and [facility].   SW spoke with Debbie/Admissions with [facility] who reported she will see if she has any male beds available, and will follow-up.   SW confirms with Star/Admissions with [facility] bed offer remains and has a bed available tomorrow.   1400- Bed offer extended by Debbie/[facility]. SW confirms he is medically ready for discharge.  1406-SW called pt dtr Windell Moulding to inform on bed offers above. Reports she will call SW back to confirm on preferred location.   Cecile Sheerer, MSW, LCSWA Office: [phone] Cell: [phone] Fax: [phone]

## 2022-09-23 NOTE — Progress Notes (Signed)
Physical Therapy Session Note  Patient Details  Name: Jesse ECKART Sr. MRN: [id number] Date of Birth: 29-Oct-1928  Today's Date: 09/23/2022 PT Individual Time: 1000-1100 PT Individual Time Calculation (min): 60 min   Short Term Goals: Week 2:  PT Short Term Goal 1 (Week 2): Pt will consistently perform STS with RW and +1 assist PT Short Term Goal 2 (Week 2): Pt will attempt a stand pivot transfer PT Short Term Goal 3 (Week 2): Pt will propel w/c >300 ft  Skilled Therapeutic Interventions/Progress Updates:    Pt seated in w/c on arrival and agreeable to therapy. No complaint of pain. Pt on purewick for chronic incontinence, so performed bedside therapy for best use of session time. Pt required extended rest breaks throughout to manage muscle fatigue and intermittent DOE.   Initiated session with seated therex for BLE strength and to address stiffness as follows -2 x 20 of each seated march, LAQ, ankle pumps superset  - 4 x 15-20 HS curls with RTB  Standing Activity for transfer training and pre-gait strengthening: -Sit to stand with mod a overall for anterior weight shift, improving slowly from previous session. Pt no longer needing cues for hand placement when preparing to stand and performing good set up consistently. - Stand to sit with mod-max typically d/t posterior lean and pt forgetful to reach back. UP to min a intermittently with cues to step back to chair and cued to reach back with L hand.  -BLE marching progressed to pre gait stepping fwd and back. Pt able to perform 2 sets of 8 in standing before needing to sit, 30-60 sec, x several bouts with seated rest breaks for endurance.  -Alternating marches 4 x 6-8 marches with cues to maintain WB status on RLE. Note pt able to sit to chair max VC but only min physical assist when backed up to chair and cued to reach back with L hand. -Progressed to 1 step fwd/1 step back for pre-gait training, cues for WB and weight shifting, x 4  Pt  remained in W/c with all needs in reach and family present.   Therapy Documentation Precautions:  Precautions Precautions: Fall Precaution Comments: L heel, R buttocks pressure injuries; HoH Required Braces or Orthoses: Other Brace Other Brace: R post op shoe Restrictions Weight Bearing Restrictions: No RLE Weight Bearing: Touchdown weight bearing Other Position/Activity Restrictions: HOH General:      Therapy/Group: Individual Therapy  [name] 09/23/2022, 10:09 AM

## 2022-09-23 NOTE — Progress Notes (Signed)
PROGRESS NOTE   Subjective/Complaints:   Pt reports doing well- breathing "about the same".  Doesn't have any pain complaints LBM last evening- small   ROS:    Pt denies SOB, abd pain, CP, N/V/C/D, and vision changes   Except for HPI Objective:   No results found. No results for input(s): "WBC", "HGB", "HCT", "PLT" in the last 72 hours.   Recent Labs    09/22/22 1237  NA 136  K 5.2*  CL 103  CO2 23  GLUCOSE 87  BUN 25*  CREATININE 0.95  CALCIUM 9.2    Intake/Output Summary (Last 24 hours) at 09/23/2022 0819 Last data filed at 09/23/2022 0754 Gross per 24 hour  Intake 837 ml  Output 800 ml  Net 37 ml     Pressure Injury 08/25/22 Heel Left Deep Tissue Pressure Injury - Purple or maroon localized area of discolored intact skin or blood-filled blister due to damage of underlying soft tissue from pressure and/or shear. 6.5cm x 5.5cm (Active)  08/25/22 0730  Location: Heel  Location Orientation: Left  Staging: Deep Tissue Pressure Injury - Purple or maroon localized area of discolored intact skin or blood-filled blister due to damage of underlying soft tissue from pressure and/or shear.  Wound Description (Comments): 6.5cm x 5.5cm  Present on Admission: Yes     Pressure Injury 08/25/22 Buttocks Right Stage 2 -  Partial thickness loss of dermis presenting as a shallow open injury with a red, pink wound bed without slough. (Active)  08/25/22   Location: Buttocks  Location Orientation: Right  Staging: Stage 2 -  Partial thickness loss of dermis presenting as a shallow open injury with a red, pink wound bed without slough.  Wound Description (Comments):   Present on Admission: Yes     Pressure Injury 09/10/22 Sacrum Medial Stage 2 -  Partial thickness loss of dermis presenting as a shallow open injury with a red, pink wound bed without slough. Elongated area over sacrum (Active)  09/10/22 1912  Location: Sacrum   Location Orientation: Medial  Staging: Stage 2 -  Partial thickness loss of dermis presenting as a shallow open injury with a red, pink wound bed without slough.  Wound Description (Comments): Elongated area over sacrum  Present on Admission: Yes    Physical Exam: Vital Signs Blood pressure (!) 134/52, pulse 64, temperature 98.4 F (36.9 C), temperature source Oral, resp. rate 16, height 5\' 7"  (1.702 m), weight 88.5 kg, SpO2 100 %.           General: awake, alert, appropriate, sitting up in bed; NAD HENT: conjugate gaze; oropharynx moist CV: regular rhythm this AM and bradycardic  rate; no JVD Pulmonary: CTA B/L; no W/R/R- good air movement- no fluid overload GI: soft, NT, ND, (+)BS- normoactive Psychiatric: appropriate- pleasant Neurological: Ox3- very HOH  Extremities: RLE and LLE - 3+ on LLE and 2-3+ on RLE Neurologic: Fairly alert. Oriented to person, place, month/year. Cranial nerves II through XII intact, motor strength is 5/5 in bilateral deltoid, bicep, tricep, grip, 3 - bilateral hip flexor, 4/5 knee extensors, ankle dorsiflexor and plantar flexor Sensory exam normal sensation to light touchin bilateral lower extremities  Musculoskeletal:  No joint swelling. Left hip a little tender with palpation laterally, PROM  Assessment/Plan: 1. Functional deficits which require 3+ hours per day of interdisciplinary therapy in a comprehensive inpatient rehab setting. Physiatrist is providing close team supervision and 24 hour management of active medical problems listed below. Physiatrist and rehab team continue to assess barriers to discharge/monitor patient progress toward functional and medical goals  Care Tool:  Bathing    Body parts bathed by patient: Right arm, Left arm, Chest, Abdomen, Right upper leg, Left upper leg, Face   Body parts bathed by helper: Buttocks, Right lower leg, Left lower leg Body parts n/a: Front perineal area   Bathing assist Assist Level:  Moderate Assistance - Patient 50 - 74%     Upper Body Dressing/Undressing Upper body dressing   What is the patient wearing?: Pull over shirt    Upper body assist Assist Level: Minimal Assistance - Patient > 75%    Lower Body Dressing/Undressing Lower body dressing      What is the patient wearing?: Pants, Incontinence brief     Lower body assist Assist for lower body dressing: Dependent - Patient 0%     Toileting Toileting    Toileting assist Assist for toileting: 2 Helpers     Transfers Chair/bed transfer  Transfers assist     Chair/bed transfer assist level: 2 Helpers     Locomotion Ambulation   Ambulation assist   Ambulation activity did not occur: Safety/medical concerns          Walk 10 feet activity   Assist  Walk 10 feet activity did not occur: Safety/medical concerns        Walk 50 feet activity   Assist Walk 50 feet with 2 turns activity did not occur: Safety/medical concerns         Walk 150 feet activity   Assist Walk 150 feet activity did not occur: Safety/medical concerns         Walk 10 feet on uneven surface  activity   Assist Walk 10 feet on uneven surfaces activity did not occur: Safety/medical concerns         Wheelchair     Assist Is the patient using a wheelchair?: Yes (Pt left in w/c but did not attempt propulsion or dependent transport.) Type of Wheelchair: Manual           Wheelchair 50 feet with 2 turns activity    Assist    Wheelchair 50 feet with 2 turns activity did not occur: Safety/medical concerns       Wheelchair 150 feet activity     Assist  Wheelchair 150 feet activity did not occur: Safety/medical concerns       Blood pressure (!) 134/52, pulse 64, temperature 98.4 F (36.9 C), temperature source Oral, resp. rate 16, height 5\' 7"  (1.702 m), weight 88.5 kg, SpO2 100 %.  Medical Problem List and Plan: 1. Functional deficits secondary to debility related to sepsis  from Right 5th MT osteomyelitis and other medical complications. Pt is s/p right 5th ray amputation 5/22.             -TDWB RLE             -patient may not yet shower due to [facility]             -ELOS/Goals: 21-24 days,  Pt's goals mod A- daughter cannot provide this- looking at possible SNF -Continue CIR therapies including PT, OT. pt doesn't want SNF, so daughter trying to  see if can take home? Con't CIR PT and OT- SNF is goal 2.  Antithrombotics: -DVT/anticoagulation:  Pharmaceutical: Eliquis             -antiplatelet therapy: ASA 81mg  daily 3. Pain Management:  Oxycodone prn. 5/27- has very little pain- mainly stinging- con't mainly tylenol prn   6/3- pain controlled- mild-  4. Mood/Behavior/Sleep: LCSW to follow for evaluation and support.              -antipsychotic agents: N/A             -Melatonin prn for insomnia.  5. Neuropsych/cognition: This patient is capable of making decisions on his own behalf. 6. Skin/Wound Care: Continue wound VAC for now.              -pressure relief and foam dressings to left heel, buttocks wounds. Medihoney with gauze   under foam dressing for left heel..  5/27- will need to check when [facility] can be removed on R foot. - Dr Lajoyce Corners is surgeon  5/28- will d/w Rinaldo Cloud to see if can check when can be removed- surgery was 5/22- so been 6 days?  5/29- can get removed 5/29- 6/2  Vac off.  Continue local dressings 7. Fluids/Electrolytes/Nutrition: Strict I/O w/daily weights             --Add  boost to help w/protein stores and promote healing.              --On zinc and Vitamin C 8. Bacteremia with osteomyelitis: Completed IV antibiotics and s/p Ray amputation             --Mop tx with Augmentin/doxycycline through June 5 9. Hyponatremia/pre-renal azotemia: Na improving form 131-->134. BUN with rise to 32.             --encourage fluid intake and should improve off Lasix.  --hyponatremia resolved off HCTZ. 5/27- BUN still 29- if rises above 30, will give IVFs- due  to hx of fluid overload, will not add IVFs before then 5/28- will recheck Thursday 5/30- BUN up to 38- but Weight up 4 kg- will try giving Lasix due to fluid overload- in tissues-  and if not better, will call IM 5/31- Bun 45- and Cr 1.14- will need some IVFs, and hopefully won't third space- will give 75cc/hour x 12 hours and recheck in AM 6/1 BUN sl improved but Cr up to 1.27. Potassium now low  -nurse gave PRN lasix this morning after running ivf  -dc prn lasix dose    -resume IVF @50cc /hr  6/2 labs improved today---  -edema unchanged  -will dc IVF  -continue Kdur supplement  -recheck BMET tomorrow 6/3- Cr 0.95 and BUN 32- a little dry-  6/6- labs pending 10. ABLA/B12 deficiency: Continue to monitor. Has high ferritin level with MCV 104. -daily B12 injections until discharge from rehab -transfuse for hgb <7 5/27- Hb stable at 7.7- will monitor trends  5/31- Transfused yesterday for Hb of 7.1- called GI- fecal occult was negative x2- so per GI, not need for Endoscopy- 11. Vitamin B 12 deficiency: Vitam in B 12< 50 with recheck after supplementation > 7,500 12. Fluid overload: Daily wts with strict I/O. Monitor for signs of overload.  --has been on IV lasix 20 mg since 05/08 w/recs to change to to prn. -will change lasix to 20mg  daily prn and monitor weights daily.  --will add low salt diet.  5/27- will add Daily weights- and monitor trend 5/29- d/w nursing  about getting pt weighed again.  5/30- weight up 4 kg- wil give Lasix 40 mg x1 and monitor weight if needs to give more tomorrow.  5/31- weight down to 84.7 kg today- down 5 kg- and now dry- will give 12 hours of 75cc/hour of IVFs Filed Weights   09/20/22 0500 09/21/22 0500 09/23/22 0600  Weight: 91 kg 90.3 kg 88.5 kg   6/1 back to prior weight. Legs look ok. They have 1+ edema at best. No sob, pt feels well. Recent ECHO with 70% EF, Heart is in reg rhythm  -IVF at 50cc/hr  -recheck bmet, weights, etc tomorrow 6/2 Weight up.  Edema unchanged. No sob, chest clear on exam, I/O's negative 3.6L  -dc IVF  -continue to monitor clinically.  6/3- BUN still up at 32- but bettter than 45 on Friday  6/4- will recheck Thursday- BMP and weight stalbe last 3 days 6/5- weight much higher/worse since 1 week ago, but before got here, changed bed- so hard to know what caused it- overall, still too high, but BUN 32- so concerned about making too dry.  6/6- Cards suggested Lasix- BUN down to 25 yesterday- is pending this AM after Lasix- still has a lot of swelling- if anything more today in LE's.  However weight down to 88.5 kg- so much change, not sure if correct- will monitor for variability/trend 13. Prostate cancer             -monitor voiding patterns             -flomax             -f/u as outpt with Dr. Retta Diones  5/28- s/p radiation for prostate CA- likely cause for Incontinence 14. New onset A fib:             -rate is controlled             -transitioned to eliquis by cardiology, continues on ASA as well  5/27- HR in 50s-60s- sounds like still in some Afib- con't regimen  6/2 pt regular rhythm today. May go in and out  6/4- on nothing to reduce HR- will con't to monitor  15. Urinary incontinence  5/27- will stop Purewick for now- put urinal where pt could reach- if has a lot of incontinence due to prostate CA- might need purewick at night- will see trends. 5/28- having urinary incontinence per pt- had multiple rounds of radiation for prostate CA- likely cause for urinary incontinence-  will see if can do purewick at night- and d/w team   5/29- d/w nursing- can do condom cath, however pt really wants to try urinal- so will not place condom cath at this time. 6/1 continue condom cath 6/4- using condomc ath and purewick- will con't  6/6- cannot keep on condom cath- will con't purewick per nursing 16. Constipation  5/29- will give sorbitol 30cc and if doesn't work, will give soap suds enema  5/30- LBM last night after soap  suds enema- large  5/31- was disimpacted of 2 extra large Bms and continued to have BM's- Abdomen much less distended- feels better  6/6- LBM small last evening 17. Hyperkalemia  6/6- K+ 5.2- stopped KCL- labs this Am- pending  I spent a total of 37    minutes on total care today- >50% coordination of care- due to d/w PA -spoke with Cards about fluid status- gave Lasix- stopped K+ due to elevated K+= BNP 196.2 yesterday- complex medical issues    LOS:  13 days A FACE TO FACE EVALUATION WAS PERFORMED  Baili Stang 09/23/2022, 8:19 AM

## 2022-09-24 DIAGNOSIS — R531 Weakness: Secondary | ICD-10-CM | POA: Diagnosis not present

## 2022-09-24 DIAGNOSIS — E43 Unspecified severe protein-calorie malnutrition: Secondary | ICD-10-CM | POA: Diagnosis not present

## 2022-09-24 DIAGNOSIS — I4811 Longstanding persistent atrial fibrillation: Secondary | ICD-10-CM | POA: Diagnosis not present

## 2022-09-24 DIAGNOSIS — I739 Peripheral vascular disease, unspecified: Secondary | ICD-10-CM | POA: Diagnosis not present

## 2022-09-24 DIAGNOSIS — E785 Hyperlipidemia, unspecified: Secondary | ICD-10-CM | POA: Diagnosis not present

## 2022-09-24 DIAGNOSIS — R4182 Altered mental status, unspecified: Secondary | ICD-10-CM | POA: Diagnosis not present

## 2022-09-24 DIAGNOSIS — R159 Full incontinence of feces: Secondary | ICD-10-CM | POA: Diagnosis not present

## 2022-09-24 DIAGNOSIS — R262 Difficulty in walking, not elsewhere classified: Secondary | ICD-10-CM | POA: Diagnosis not present

## 2022-09-24 DIAGNOSIS — G9341 Metabolic encephalopathy: Secondary | ICD-10-CM | POA: Diagnosis not present

## 2022-09-24 DIAGNOSIS — L89154 Pressure ulcer of sacral region, stage 4: Secondary | ICD-10-CM | POA: Diagnosis not present

## 2022-09-24 DIAGNOSIS — M818 Other osteoporosis without current pathological fracture: Secondary | ICD-10-CM | POA: Diagnosis not present

## 2022-09-24 DIAGNOSIS — K922 Gastrointestinal hemorrhage, unspecified: Secondary | ICD-10-CM | POA: Diagnosis not present

## 2022-09-24 DIAGNOSIS — N400 Enlarged prostate without lower urinary tract symptoms: Secondary | ICD-10-CM | POA: Diagnosis not present

## 2022-09-24 DIAGNOSIS — I4819 Other persistent atrial fibrillation: Secondary | ICD-10-CM | POA: Diagnosis not present

## 2022-09-24 DIAGNOSIS — L89312 Pressure ulcer of right buttock, stage 2: Secondary | ICD-10-CM | POA: Diagnosis not present

## 2022-09-24 DIAGNOSIS — N3001 Acute cystitis with hematuria: Secondary | ICD-10-CM | POA: Diagnosis not present

## 2022-09-24 DIAGNOSIS — I480 Paroxysmal atrial fibrillation: Secondary | ICD-10-CM | POA: Diagnosis not present

## 2022-09-24 DIAGNOSIS — Z7901 Long term (current) use of anticoagulants: Secondary | ICD-10-CM | POA: Diagnosis not present

## 2022-09-24 DIAGNOSIS — L89153 Pressure ulcer of sacral region, stage 3: Secondary | ICD-10-CM | POA: Diagnosis not present

## 2022-09-24 DIAGNOSIS — Z978 Presence of other specified devices: Secondary | ICD-10-CM | POA: Diagnosis not present

## 2022-09-24 DIAGNOSIS — J40 Bronchitis, not specified as acute or chronic: Secondary | ICD-10-CM | POA: Diagnosis not present

## 2022-09-24 DIAGNOSIS — R41841 Cognitive communication deficit: Secondary | ICD-10-CM | POA: Diagnosis not present

## 2022-09-24 DIAGNOSIS — Z8546 Personal history of malignant neoplasm of prostate: Secondary | ICD-10-CM | POA: Diagnosis not present

## 2022-09-24 DIAGNOSIS — S31000A Unspecified open wound of lower back and pelvis without penetration into retroperitoneum, initial encounter: Secondary | ICD-10-CM | POA: Diagnosis not present

## 2022-09-24 DIAGNOSIS — E872 Acidosis, unspecified: Secondary | ICD-10-CM | POA: Diagnosis not present

## 2022-09-24 DIAGNOSIS — L8990 Pressure ulcer of unspecified site, unspecified stage: Secondary | ICD-10-CM | POA: Diagnosis not present

## 2022-09-24 DIAGNOSIS — Z23 Encounter for immunization: Secondary | ICD-10-CM | POA: Diagnosis not present

## 2022-09-24 DIAGNOSIS — E871 Hypo-osmolality and hyponatremia: Secondary | ICD-10-CM | POA: Diagnosis not present

## 2022-09-24 DIAGNOSIS — R7989 Other specified abnormal findings of blood chemistry: Secondary | ICD-10-CM | POA: Diagnosis not present

## 2022-09-24 DIAGNOSIS — E569 Vitamin deficiency, unspecified: Secondary | ICD-10-CM | POA: Diagnosis not present

## 2022-09-24 DIAGNOSIS — L89620 Pressure ulcer of left heel, unstageable: Secondary | ICD-10-CM | POA: Diagnosis not present

## 2022-09-24 DIAGNOSIS — D62 Acute posthemorrhagic anemia: Secondary | ICD-10-CM | POA: Diagnosis not present

## 2022-09-24 DIAGNOSIS — R61 Generalized hyperhidrosis: Secondary | ICD-10-CM | POA: Diagnosis not present

## 2022-09-24 DIAGNOSIS — Z7189 Other specified counseling: Secondary | ICD-10-CM | POA: Diagnosis not present

## 2022-09-24 DIAGNOSIS — I4891 Unspecified atrial fibrillation: Secondary | ICD-10-CM | POA: Diagnosis not present

## 2022-09-24 DIAGNOSIS — R9721 Rising PSA following treatment for malignant neoplasm of prostate: Secondary | ICD-10-CM | POA: Diagnosis not present

## 2022-09-24 DIAGNOSIS — T83511A Infection and inflammatory reaction due to indwelling urethral catheter, initial encounter: Secondary | ICD-10-CM | POA: Diagnosis not present

## 2022-09-24 DIAGNOSIS — L89150 Pressure ulcer of sacral region, unstageable: Secondary | ICD-10-CM | POA: Diagnosis not present

## 2022-09-24 DIAGNOSIS — Z7401 Bed confinement status: Secondary | ICD-10-CM | POA: Diagnosis not present

## 2022-09-24 DIAGNOSIS — A419 Sepsis, unspecified organism: Secondary | ICD-10-CM | POA: Diagnosis not present

## 2022-09-24 DIAGNOSIS — L89896 Pressure-induced deep tissue damage of other site: Secondary | ICD-10-CM | POA: Diagnosis not present

## 2022-09-24 DIAGNOSIS — R7881 Bacteremia: Secondary | ICD-10-CM | POA: Diagnosis not present

## 2022-09-24 DIAGNOSIS — I700 Atherosclerosis of aorta: Secondary | ICD-10-CM | POA: Diagnosis not present

## 2022-09-24 DIAGNOSIS — A4150 Gram-negative sepsis, unspecified: Secondary | ICD-10-CM | POA: Diagnosis not present

## 2022-09-24 DIAGNOSIS — R410 Disorientation, unspecified: Secondary | ICD-10-CM | POA: Diagnosis not present

## 2022-09-24 DIAGNOSIS — D649 Anemia, unspecified: Secondary | ICD-10-CM | POA: Diagnosis not present

## 2022-09-24 DIAGNOSIS — R300 Dysuria: Secondary | ICD-10-CM | POA: Diagnosis not present

## 2022-09-24 DIAGNOSIS — R600 Localized edema: Secondary | ICD-10-CM | POA: Diagnosis not present

## 2022-09-24 DIAGNOSIS — R609 Edema, unspecified: Secondary | ICD-10-CM | POA: Diagnosis not present

## 2022-09-24 DIAGNOSIS — M6281 Muscle weakness (generalized): Secondary | ICD-10-CM | POA: Diagnosis not present

## 2022-09-24 DIAGNOSIS — R2681 Unsteadiness on feet: Secondary | ICD-10-CM | POA: Diagnosis not present

## 2022-09-24 DIAGNOSIS — N401 Enlarged prostate with lower urinary tract symptoms: Secondary | ICD-10-CM | POA: Diagnosis not present

## 2022-09-24 DIAGNOSIS — Z681 Body mass index (BMI) 19 or less, adult: Secondary | ICD-10-CM | POA: Diagnosis not present

## 2022-09-24 DIAGNOSIS — I70221 Atherosclerosis of native arteries of extremities with rest pain, right leg: Secondary | ICD-10-CM | POA: Diagnosis not present

## 2022-09-24 DIAGNOSIS — I70229 Atherosclerosis of native arteries of extremities with rest pain, unspecified extremity: Secondary | ICD-10-CM | POA: Diagnosis not present

## 2022-09-24 DIAGNOSIS — N138 Other obstructive and reflux uropathy: Secondary | ICD-10-CM | POA: Diagnosis not present

## 2022-09-24 DIAGNOSIS — N39498 Other specified urinary incontinence: Secondary | ICD-10-CM | POA: Diagnosis not present

## 2022-09-24 DIAGNOSIS — S98131A Complete traumatic amputation of one right lesser toe, initial encounter: Secondary | ICD-10-CM | POA: Diagnosis not present

## 2022-09-24 DIAGNOSIS — Z1152 Encounter for screening for COVID-19: Secondary | ICD-10-CM | POA: Diagnosis not present

## 2022-09-24 DIAGNOSIS — E8770 Fluid overload, unspecified: Secondary | ICD-10-CM | POA: Diagnosis not present

## 2022-09-24 DIAGNOSIS — Z4781 Encounter for orthopedic aftercare following surgical amputation: Secondary | ICD-10-CM | POA: Diagnosis not present

## 2022-09-24 DIAGNOSIS — G7281 Critical illness myopathy: Secondary | ICD-10-CM | POA: Diagnosis not present

## 2022-09-24 DIAGNOSIS — M869 Osteomyelitis, unspecified: Secondary | ICD-10-CM | POA: Diagnosis not present

## 2022-09-24 DIAGNOSIS — N179 Acute kidney failure, unspecified: Secondary | ICD-10-CM | POA: Diagnosis not present

## 2022-09-24 DIAGNOSIS — I959 Hypotension, unspecified: Secondary | ICD-10-CM | POA: Diagnosis not present

## 2022-09-24 DIAGNOSIS — K219 Gastro-esophageal reflux disease without esophagitis: Secondary | ICD-10-CM | POA: Diagnosis not present

## 2022-09-24 DIAGNOSIS — N390 Urinary tract infection, site not specified: Secondary | ICD-10-CM | POA: Diagnosis not present

## 2022-09-24 DIAGNOSIS — L89610 Pressure ulcer of right heel, unstageable: Secondary | ICD-10-CM | POA: Diagnosis not present

## 2022-09-24 DIAGNOSIS — B964 Proteus (mirabilis) (morganii) as the cause of diseases classified elsewhere: Secondary | ICD-10-CM | POA: Diagnosis not present

## 2022-09-24 DIAGNOSIS — Z515 Encounter for palliative care: Secondary | ICD-10-CM | POA: Diagnosis not present

## 2022-09-24 DIAGNOSIS — N319 Neuromuscular dysfunction of bladder, unspecified: Secondary | ICD-10-CM | POA: Diagnosis not present

## 2022-09-24 DIAGNOSIS — C61 Malignant neoplasm of prostate: Secondary | ICD-10-CM | POA: Diagnosis not present

## 2022-09-24 DIAGNOSIS — K5900 Constipation, unspecified: Secondary | ICD-10-CM | POA: Diagnosis not present

## 2022-09-24 DIAGNOSIS — E46 Unspecified protein-calorie malnutrition: Secondary | ICD-10-CM | POA: Diagnosis not present

## 2022-09-24 DIAGNOSIS — E8809 Other disorders of plasma-protein metabolism, not elsewhere classified: Secondary | ICD-10-CM | POA: Diagnosis not present

## 2022-09-24 DIAGNOSIS — Y846 Urinary catheterization as the cause of abnormal reaction of the patient, or of later complication, without mention of misadventure at the time of the procedure: Secondary | ICD-10-CM | POA: Diagnosis present

## 2022-09-24 DIAGNOSIS — I10 Essential (primary) hypertension: Secondary | ICD-10-CM | POA: Diagnosis not present

## 2022-09-24 DIAGNOSIS — R6521 Severe sepsis with septic shock: Secondary | ICD-10-CM | POA: Diagnosis not present

## 2022-09-24 DIAGNOSIS — L89616 Pressure-induced deep tissue damage of right heel: Secondary | ICD-10-CM | POA: Diagnosis not present

## 2022-09-24 DIAGNOSIS — Z89421 Acquired absence of other right toe(s): Secondary | ICD-10-CM | POA: Diagnosis not present

## 2022-09-24 DIAGNOSIS — R5381 Other malaise: Secondary | ICD-10-CM | POA: Diagnosis not present

## 2022-09-24 NOTE — Progress Notes (Signed)
Inpatient Rehabilitation Care Coordinator Discharge Note   Patient Details  Name: Jesse BINK Sr. MRN: [id number] Date of Birth: Jun 07, 1928   Discharge location: GOING TO [facility] TO GET MORE REHAB  Length of Stay: 14 days  Discharge activity level: MIN ASSIST  Home/community participation: ACTIVE  Patient response WU:JWJXBJ Literacy - How often do you need to have someone help you when you read instructions, pamphlets, or other written material from your doctor or pharmacy?: Never  Patient response YN:WGNFAO Isolation - How often do you feel lonely or isolated from those around you?: Never  Services provided included: MD, RD, PT, OT, RN, CM, TR, Pharmacy, SW  Financial Services:  Financial Services Utilized: Medicare    Choices offered to/list presented to: PT AND DAUGHTER  Follow-up services arranged:  Other (Comment) (SNF)           Patient response to transportation need: Is the patient able to respond to transportation needs?: Yes In the past 12 months, has lack of transportation kept you from medical appointments or from getting medications?: No In the past 12 months, has lack of transportation kept you from meetings, work, or from getting things needed for daily living?: No   Patient/Family verbalized understanding of follow-up arrangements:  Yes  Individual responsible for coordination of the follow-up plan: RUTH-DAUGHTER [phone]  Confirmed correct DME delivered: Lucy Chris 09/24/2022    Comments (or additional information):PT NEEDS MORE REHAB BEFORE RETURNING HOME WITH HIS DAUGHTER.   Summary of Stay    Date/Time Discharge Planning CSW  09/20/22 1024 Discharge plan remains undecided at this time. SW discussed in length the support pt may continue to require at discharge since she reports she is not physically able to lift him and has been calling son and son's father to come to the home and assist. Family edu on 6/3 for afternoon OT  session, and 6/4  1pm-4pm with pt son and dtr. SW will confirm there are no barriers to discharge. AAC  09/13/22 1108 Discharge plan undecided at this time. SW discussed in length the support pt may continue to require at discharge since she reports she is not physically able to lift him and has been calling son and son's father to come to the home and assist. Will schedule family edu closer towards discharge to allow her to make a more informed decision. SW will confirm there are no barriers to discharge. AAC       Chaz Ronning, Lemar Livings

## 2022-09-24 NOTE — Progress Notes (Signed)
Occupational Therapy Discharge Summary  Patient Details  Name: Jesse PEVEHOUSE Sr. MRN: [id number] Date of Birth: December 15, 1928  Date of Discharge from OT service:September 24, 2022   Patient has met 2 of 9 long term goals due to {due WU:9811914}.  Pt demonstrated slow progress with BADLs and functional transfers during this admission. Pt discharging to SNF, before completion of Rehab program, to continue Rehab. Pt will not have assistance at home. Pt requires mod A/max A for BADLs and functional transfers. Pt's daughter and son have been present for education and expressed that they are unable to provide physical assistance at home. Patient to discharge at overall {LOA:3049010} level.  Patient's care partner {care partner:[id number]} to provide the necessary {assistance:3041652} assistance at discharge.    Reasons goals not met: Discharge to SNF before completion of Inpatient Rehab program  Recommendation:  Patient will benefit from ongoing skilled OT services in {setting:3041680} to continue to advance functional skills in the area of {ADL/iADL:3041649}.  Equipment: No equipment provided Discharge to SNF  Reasons for discharge: {Reason for discharge:3049018}  Patient/family agrees with progress made and goals achieved: {Pt/Family agree with progress/goals:3049020}  OT Discharge  ADL ADL Equipment Provided: Reacher, Sock aid Eating: Supervision/safety Where Assessed-Eating: Wheelchair Grooming: Supervision/safety Where Assessed-Grooming: Sitting at sink Upper Body Bathing: Supervision/safety Where Assessed-Upper Body Bathing: Sitting at sink, Wheelchair Lower Body Bathing: Moderate cueing Where Assessed-Lower Body Bathing: Sitting at sink Upper Body Dressing: Supervision/safety Where Assessed-Upper Body Dressing: Sitting at sink, Wheelchair Lower Body Dressing: Maximal assistance Where Assessed-Lower Body Dressing: Sitting at sink, Standing at sink, Wheelchair Toileting: Maximal  assistance Where Assessed-Toileting: Bed level Toilet Transfer: Maximal assistance Toilet Transfer Method: Stand pivot Acupuncturist: Grab bars Tub/Shower Transfer: Unable to assess Tub/Shower Transfer Method: Unable to assess [employer] Transfer: Maximal assistance Film/video editor Method: Warden/ranger: Grab bars, Shower seat with back ADL Comments: Wound vac on RLE - Not allowed to shower.  Unable to achieve full standing, patient unable to maintain TDWB RLE Vision Baseline Vision/History: 1 Wears glasses Vision Assessment?: Yes Perception  Perception: Within Functional Limits Praxis Praxis: Intact Cognition Cognition Overall Cognitive Status: Within Functional Limits for tasks assessed Arousal/Alertness: Awake/alert Orientation Level: Person;Place;Situation Person: Oriented Place: Oriented Situation: Oriented Memory: Appears intact Decreased Short Term Memory: Functional basic Attention: Sustained;Selective Sustained Attention: Appears intact Selective Attention: Appears intact Awareness: Appears intact Problem Solving: Impaired Problem Solving Impairment: Functional basic Safety/Judgment: Appears intact Brief Interview for Mental Status (BIMS) Repetition of Three Words (First Attempt): 3 Temporal Orientation: Year: Correct Temporal Orientation: Month: Accurate within 5 days Temporal Orientation: Day: Correct Recall: "Sock": Yes, no cue required Recall: "Blue": Yes, no cue required Recall: "Bed": Yes, no cue required BIMS Summary Score: 15 Sensation Sensation Light Touch: Appears Intact Peripheral sensation comments: Pt reports good sensation, but questionable d/t history Hot/Cold: Appears Intact Proprioception: Impaired by gross assessment Stereognosis: Not tested Motor  Motor Motor: Abnormal postural alignment and control Motor - Skilled Clinical Observations: gross weakness and debility Mobility  Bed  Mobility Bed Mobility: Sit to Supine;Rolling Right;Rolling Left Rolling Right: Minimal Assistance - Patient > 75% Rolling Left: Minimal Assistance - Patient > 75% Supine to Sit: Minimal Assistance - Patient > 75% Sit to Supine: Moderate Assistance - Patient 50-74% Transfers Sit to Stand: Moderate Assistance - Patient 50-74%  Trunk/Postural Assessment  Cervical Assessment Cervical Assessment: Within Functional Limits Thoracic Assessment Thoracic Assessment: Within Functional Limits Lumbar Assessment Lumbar Assessment: Within Functional Limits Postural Control Postural Control: Deficits on  evaluation Trunk Control: inability to sustain upright trunk for transfer/ transitional movement  Balance Balance Balance Assessed: Yes Static Sitting Balance Static Sitting - Balance Support: Feet supported Static Sitting - Level of Assistance: 5: Stand by assistance Dynamic Sitting Balance Dynamic Sitting - Balance Support: During functional activity Dynamic Sitting - Level of Assistance: 4: Min assist Static Standing Balance Static Standing - Level of Assistance: 3: Mod assist Extremity/Trunk Assessment RUE Assessment RUE Assessment: Within Functional Limits LUE Assessment LUE Assessment: Within Functional Limits   [name] 09/24/2022, 1:44 PM

## 2022-09-24 NOTE — Progress Notes (Signed)
Physical Therapy Discharge Summary  Patient Details  Name: Jesse TRANCHINA Sr. MRN: [id number] Date of Birth: [birth year]  Date of Discharge from PT service:September 24, 2022  Today's Date: 09/24/2022    Patient has met 3 of 8 long term goals due to increased strength, decreased pain, and ability to compensate for deficits.  Patient to discharge at a wheelchair level Max Assist.   Patient's care partner unavailable to provide the necessary physical assistance at discharge.  Reasons goals not met: Pt d/c prior to full ELOS d/t high anticipated care needs.   Recommendation:  Patient will benefit from ongoing skilled PT services in skilled nursing facility setting to continue to advance safe functional mobility, address ongoing impairments in strength, balance, WB precaution, ROM, pain, and minimize fall risk.  Equipment: No equipment provided  Reasons for discharge: discharge from hospital  Patient/family agrees with progress made and goals achieved: Yes  PT Discharge Precautions/Restrictions Restrictions Weight Bearing Restrictions: No RLE Weight Bearing: Touchdown weight bearing Vital Signs  Pain Pain Assessment Pain Scale: 0-10 Pain Score: 0-No pain Pain Interference Pain Interference Pain Effect on Sleep: 1. Rarely or not at all Pain Interference with Therapy Activities: 1. Rarely or not at all Pain Interference with Day-to-Day Activities: 2. Occasionally Vision/Perception  Vision - History Ability to See in Adequate Light: 1 Impaired Perception Perception: Within Functional Limits Praxis Praxis: Intact  Cognition Orientation Level: Oriented to person;Oriented to place;Oriented to situation Sensation Sensation Light Touch: Appears Intact (BLE) Hot/Cold: Appears Intact Proprioception: Impaired by gross assessment Motor  Motor Motor: Abnormal postural alignment and control Motor - Skilled Clinical Observations: gross weakness and debility  Mobility Bed Mobility Bed  Mobility: Sit to Supine;Rolling Right;Rolling Left Rolling Right: Minimal Assistance - Patient > 75% Rolling Left: Minimal Assistance - Patient > 75% Supine to Sit: Minimal Assistance - Patient > 75% Sit to Supine: Moderate Assistance - Patient 50-74% Transfers Transfers: Stand Pivot Transfers Sit to Stand: Moderate Assistance - Patient 50-74% Stand Pivot Transfers: Moderate Assistance - Patient 50 - 74% Locomotion  Pick up small object from the floor (from standing position) activity did not occur: Safety/medical concerns  Trunk/Postural Assessment  Cervical Assessment Cervical Assessment: Within Functional Limits Thoracic Assessment Thoracic Assessment: Within Functional Limits Lumbar Assessment Lumbar Assessment: Within Functional Limits Postural Control Postural Control: Deficits on evaluation  Balance Balance Balance Assessed: Yes Static Sitting Balance Static Sitting - Balance Support: Feet supported Static Sitting - Level of Assistance: 5: Stand by assistance Dynamic Sitting Balance Dynamic Sitting - Balance Support: Feet unsupported;No upper extremity supported Dynamic Sitting - Level of Assistance: 4: Min assist Static Standing Balance Static Standing - Level of Assistance: 3: Mod assist Extremity Assessment  RUE Assessment RUE Assessment: Within Functional Limits LUE Assessment LUE Assessment: Within Functional Limits RLE Assessment RLE Assessment: Exceptions to [facility] General Strength Comments: Grossly 4-/5 LLE Assessment LLE Assessment: Exceptions to [facility] General Strength Comments: grossly 4-/5   [name] 09/24/2022, 10:46 AM

## 2022-09-24 NOTE — Progress Notes (Addendum)
Patient ID: Jesse Pollock Sr., male   DOB: 09-08-1928, 89 y.o.   MRN: [id number]  Spoke with Ruth-daughter via telephone who has chosen [facility] for Dad to go to get more rehab. Have updated team and let Pam-PA know. Plan to transfer to [facility] later today.   10:04 AM Ha ve scheduled PTAR for 1:00 pm for transport. Pt is aware of this plan and agreeable to.

## 2022-09-24 NOTE — Progress Notes (Signed)
Physical Therapy Session Note  Patient Details  Name: Jesse HICKLIN Sr. MRN: [id number] Date of Birth: October 10, 1928  Today's Date: 09/24/2022 PT Individual Time: 1000-1100 PT Individual Time Calculation (min): 60 min   Short Term Goals: Week 2:  PT Short Term Goal 1 (Week 2): Pt will consistently perform STS with RW and +1 assist PT Short Term Goal 2 (Week 2): Pt will attempt a stand pivot transfer PT Short Term Goal 3 (Week 2): Pt will propel w/c >300 ft  Skilled Therapeutic Interventions/Progress Updates: Pt presented in w/c agreeable to therapy. Pt denies pain at rest. Session focused on BLE strengthening and functional mobility in preparation for d/c. During session pt's family arrived and remained during session. Pt work on Sit to stand x 5 requiring modA consistently with cues for increasing anterior weight shifting and maintaining weight through heel of RLE. On 4th stand pt performed standing marches which pt was minimally able to clear. Pt was able to take x 2 larger steps backwards towards w/c. After extended seated rest break pt performed Sit to stand requiring x 2 attempts due to fatigue however on second attempt pt was able to clear feet and performed step pivot transfer to bed with modA. Pt noted to fatigue quickly and require increased cues for sequencing ("step backwards" or "pick up feet"). Due to safety pt transferred back to w/c via [facility] which pt was able to stand with minA from bed and near CGA from perched position. Pt left in w/c at end of session with call bell within reach and family present.      Therapy Documentation Precautions:  Precautions Precautions: Fall Precaution Comments: L heel, R buttocks pressure injuries; HoH Required Braces or Orthoses: Other Brace Other Brace: R post op shoe Restrictions Weight Bearing Restrictions: No RLE Weight Bearing: Touchdown weight bearing Other Position/Activity Restrictions: HOH General:   Vital Signs:  Pain:    Mobility:   Locomotion :    Trunk/Postural Assessment : Cervical Assessment Cervical Assessment: Within Functional Limits Thoracic Assessment Thoracic Assessment: Within Functional Limits Lumbar Assessment Lumbar Assessment: Within Functional Limits Postural Control Postural Control: Deficits on evaluation Trunk Control: inability to sustain upright trunk for transfer/ transitional movement  Balance: Static Sitting Balance Static Sitting - Balance Support: Feet supported Static Sitting - Level of Assistance: 5: Stand by assistance Dynamic Sitting Balance Dynamic Sitting - Balance Support: During functional activity Dynamic Sitting - Level of Assistance: 4: Min assist Exercises:   Other Treatments:      Therapy/Group: Individual Therapy  Devin Foskey 09/24/2022, 3:23 PM

## 2022-09-24 NOTE — Progress Notes (Signed)
Occupational Therapy Note  Patient Details  Name: Jesse WESTWOOD Sr. MRN: [id number] Date of Birth: 06-20-1928  Today's Date: 09/24/2022 OT Missed Time: 60 Minutes Missed Time Reason: Other (comment) (preparing for discharge home)  Pt missed 60 mins skilled OT services. Preparing for unplanned discharge to SNF this afternoon.   Lavone Neri [facility] 09/24/2022, 2:02 PM

## 2022-09-24 NOTE — Progress Notes (Signed)
PROGRESS NOTE   Subjective/Complaints:  Pt with OT when I came in. Breathing is ok. Still some swelling in legs. He's aware family is looking at [facility].   ROS: Patient denies fever, rash, sore throat, blurred vision, dizziness, nausea, vomiting, diarrhea, cough, shortness of breath or chest pain, headache, or mood change.    Objective:   No results found. No results for input(s): "WBC", "HGB", "HCT", "PLT" in the last 72 hours.   Recent Labs    09/22/22 1237 09/23/22 1012  NA 136 133*  K 5.2* 4.8  CL 103 106  CO2 23 21*  GLUCOSE 87 96  BUN 25* 26*  CREATININE 0.95 0.92  CALCIUM 9.2 8.8*    Intake/Output Summary (Last 24 hours) at 09/24/2022 1124 Last data filed at 09/24/2022 0433 Gross per 24 hour  Intake 474 ml  Output 1400 ml  Net -926 ml     Pressure Injury 08/25/22 Heel Left Deep Tissue Pressure Injury - Purple or maroon localized area of discolored intact skin or blood-filled blister due to damage of underlying soft tissue from pressure and/or shear. 6.5cm x 5.5cm (Active)  08/25/22 0730  Location: Heel  Location Orientation: Left  Staging: Deep Tissue Pressure Injury - Purple or maroon localized area of discolored intact skin or blood-filled blister due to damage of underlying soft tissue from pressure and/or shear.  Wound Description (Comments): 6.5cm x 5.5cm  Present on Admission: Yes     Pressure Injury 08/25/22 Buttocks Right Stage 2 -  Partial thickness loss of dermis presenting as a shallow open injury with a red, pink wound bed without slough. (Active)  08/25/22   Location: Buttocks  Location Orientation: Right  Staging: Stage 2 -  Partial thickness loss of dermis presenting as a shallow open injury with a red, pink wound bed without slough.  Wound Description (Comments):   Present on Admission: Yes     Pressure Injury 09/10/22 Sacrum Medial Stage 2 -  Partial thickness loss of dermis presenting as  a shallow open injury with a red, pink wound bed without slough. Elongated area over sacrum (Active)  09/10/22 1912  Location: Sacrum  Location Orientation: Medial  Staging: Stage 2 -  Partial thickness loss of dermis presenting as a shallow open injury with a red, pink wound bed without slough.  Wound Description (Comments): Elongated area over sacrum  Present on Admission: Yes    Physical Exam: Vital Signs Blood pressure 139/64, pulse (!) 47, temperature 98.5 F (36.9 C), temperature source Oral, resp. rate 17, height 5\' 7"  (1.702 m), weight 88.9 kg, SpO2 99 %.           Constitutional: No distress . Vital signs reviewed. HEENT: NCAT, EOMI, oral membranes moist Neck: supple Cardiovascular: RRR without murmur. No JVD    Respiratory/Chest: CTA Bilaterally without wheezes or rales. Normal effort    GI/Abdomen: BS +, non-tender, non-distended Ext: no clubbing, cyanosis. 1+ LE edema, 2+ right pedal   Extremities: RLE and LLE - 3+ on LLE and 2-3+ on RLE Neurologic: Alert, HOH.  Oriented to person, place, month/year. Cranial nerves II through XII intact, motor strength is 5/5 in bilateral deltoid, bicep, tricep, grip, 3 -  bilateral hip flexor, 4/5 knee extensors, ankle dorsiflexor and plantar flexor Sensory exam normal sensation to light touchin bilateral lower extremities  Musculoskeletal:   No joint swelling. Left hip a little tender with palpation laterally, PROM Skin:      Assessment/Plan: 1. Functional deficits which require 3+ hours per day of interdisciplinary therapy in a comprehensive inpatient rehab setting. Physiatrist is providing close team supervision and 24 hour management of active medical problems listed below. Physiatrist and rehab team continue to assess barriers to discharge/monitor patient progress toward functional and medical goals  Care Tool:  Bathing    Body parts bathed by patient: Right arm, Left arm, Chest, Abdomen, Front perineal area, Right  upper leg, Left upper leg, Face   Body parts bathed by helper: Buttocks, Right lower leg, Left lower leg Body parts n/a: Front perineal area   Bathing assist Assist Level: Moderate Assistance - Patient 50 - 74%     Upper Body Dressing/Undressing Upper body dressing   What is the patient wearing?: Pull over shirt    Upper body assist Assist Level: Supervision/Verbal cueing    Lower Body Dressing/Undressing Lower body dressing      What is the patient wearing?: Pants, Incontinence brief     Lower body assist Assist for lower body dressing: Total Assistance - Patient < 25%     Toileting Toileting    Toileting assist Assist for toileting: Maximal Assistance - Patient 25 - 49%     Transfers Chair/bed transfer  Transfers assist     Chair/bed transfer assist level: Maximal Assistance - Patient 25 - 49%     Locomotion Ambulation   Ambulation assist   Ambulation activity did not occur: Safety/medical concerns          Walk 10 feet activity   Assist  Walk 10 feet activity did not occur: Safety/medical concerns        Walk 50 feet activity   Assist Walk 50 feet with 2 turns activity did not occur: Safety/medical concerns         Walk 150 feet activity   Assist Walk 150 feet activity did not occur: Safety/medical concerns         Walk 10 feet on uneven surface  activity   Assist Walk 10 feet on uneven surfaces activity did not occur: Safety/medical concerns         Wheelchair     Assist Is the patient using a wheelchair?: Yes Type of Wheelchair: Manual    Wheelchair assist level: Supervision/Verbal cueing Max wheelchair distance: 150    Wheelchair 50 feet with 2 turns activity    Assist    Wheelchair 50 feet with 2 turns activity did not occur: Safety/medical concerns   Assist Level: Supervision/Verbal cueing   Wheelchair 150 feet activity     Assist  Wheelchair 150 feet activity did not occur: Safety/medical  concerns   Assist Level: Supervision/Verbal cueing   Blood pressure 139/64, pulse (!) 47, temperature 98.5 F (36.9 C), temperature source Oral, resp. rate 17, height 5\' 7"  (1.702 m), weight 88.9 kg, SpO2 99 %.  Medical Problem List and Plan: 1. Functional deficits secondary to debility related to sepsis from Right 5th MT osteomyelitis and other medical complications. Pt is s/p right 5th ray amputation 5/22.             -TDWB RLE             -Pt has SNF bed today at [facility]  -pt to follow  up with Dr. Lajoyce Corners next week re: sutures and wound R foot. 2.  Antithrombotics: -DVT/anticoagulation:  Pharmaceutical: Eliquis             -antiplatelet therapy: ASA 81mg  daily 3. Pain Management:  Oxycodone prn. 5/27- has very little pain- mainly stinging- con't mainly tylenol prn   6/3- pain controlled- mild-  4. Mood/Behavior/Sleep: LCSW to follow for evaluation and support.              -antipsychotic agents: N/A             -Melatonin prn for insomnia.  5. Neuropsych/cognition: This patient is capable of making decisions on his own behalf. 6. Skin/Wound Care: wound examined, pictures in chart.  -continue Aquacel, dry dressing, ACE  -pt to see ortho next week for follow up.   -maintain nutrition 7. Fluids/Electrolytes/Nutrition: Strict I/O w/daily weights             --Add  boost to help w/protein stores and promote healing.              --On zinc and Vitamin C 8. Bacteremia with osteomyelitis: Completed IV antibiotics and s/p Ray amputation             --Mop tx with Augmentin/doxycycline through June 5 9. Hyponatremia/pre-renal azotemia: Na improving form 131-->134. BUN with rise to 32.             --encourage fluid intake and should improve off Lasix.  --hyponatremia resolved off HCTZ. 5/27- BUN still 29- if rises above 30, will give IVFs- due to hx of fluid overload, will not add IVFs before then 5/28- will recheck Thursday 5/30- BUN up to 38- but Weight up 4 kg- will try giving  Lasix due to fluid overload- in tissues-  and if not better, will call IM 5/31- Bun 45- and Cr 1.14- will need some IVFs, and hopefully won't third space- will give 75cc/hour x 12 hours and recheck in AM 6/1 BUN sl improved but Cr up to 1.27. Potassium now low  -nurse gave PRN lasix this morning after running ivf  -dc prn lasix dose    -resume IVF @50cc /hr  6/2 labs improved today---  -edema unchanged  -will dc IVF  -continue Kdur supplement  -recheck BMET tomorrow 6/7 remains a little dry 26/0.92--probably a good place to be at given his edema and hx of volume overload. Sodium has ranged from 133-136--on lasix 20mg  daily 10. ABLA/B12 deficiency: Continue to monitor. Has high ferritin level with MCV 104. -daily B12 injections until discharge from rehab -transfuse for hgb <7 5/27- Hb stable at 7.7- will monitor trends  5/31- Transfused yesterday for Hb of 7.1- called GI- fecal occult was negative x2- so per GI, not need for Endoscopy- 11. Vitamin B 12 deficiency: Vitam in B 12< 50 with recheck after supplementation > 7,500 12. Fluid overload: Daily wts with strict I/O. Monitor for signs of overload.  --has been on IV lasix 20 mg since 05/08 w/recs to change to to prn. -will change lasix to 20mg  daily prn and monitor weights daily.  --will add low salt diet.  5/27- will add Daily weights- and monitor trend 5/29- d/w nursing about getting pt weighed again.  5/30- weight up 4 kg- wil give Lasix 40 mg x1 and monitor weight if needs to give more tomorrow.  5/31- weight down to 84.7 kg today- down 5 kg- and now dry- will give 12 hours of 75cc/hour of IVFs Filed Weights   09/21/22 0500  09/23/22 0600 09/24/22 0416  Weight: 90.3 kg 88.5 kg 88.9 kg   6/1 back to prior weight. Legs look ok. They have 1+ edema at best. No sob, pt feels well. Recent ECHO with 70% EF, Heart is in reg rhythm  -IVF at 50cc/hr  -recheck bmet, weights, etc tomorrow 6/2 Weight up. Edema unchanged. No sob, chest clear on  exam, I/O's negative 3.6L  -dc IVF  -continue to monitor clinically.  6/3- BUN still up at 32- but bettter than 45 on Friday  6/4- will recheck Thursday- BMP and weight stalbe last 3 days 6/5- weight much higher/worse since 1 week ago, but before got here, changed bed- so hard to know what caused it- overall, still too high, but BUN 32- so concerned about making too dry.  6/6- Cards suggested Lasix- BUN down to 25 yesterday- is pending this AM after Lasix- still has a lot of swelling- if anything more today in LE's.  However weight down to 88.5 kg- so much change, not sure if correct- will monitor for variability/trend 6/7 labs stable today. Weights down from 2 days ago. Maintain at this level. Will need weights/edema followed at facility.  13. Prostate cancer             -monitor voiding patterns             -flomax             -f/u as outpt with Dr. Retta Diones  5/28- s/p radiation for prostate CA- likely cause for Incontinence 14. New onset A fib:             -rate is controlled             -transitioned to eliquis by cardiology, continues on ASA as well  5/27- HR in 50s-60s- sounds like still in some Afib- con't regimen  6/2 pt regular rhythm today. May go in and out  6/4- on nothing to reduce HR- will con't to monitor  15. Urinary incontinence  5/27- will stop Purewick for now- put urinal where pt could reach- if has a lot of incontinence due to prostate CA- might need purewick at night- will see trends. 5/28- having urinary incontinence per pt- had multiple rounds of radiation for prostate CA- likely cause for urinary incontinence-  will see if can do purewick at night- and d/w team   5/29- d/w nursing- can do condom cath, however pt really wants to try urinal- so will not place condom cath at this time. 6/1 continue condom cath 6/4- using condomc ath and purewick- will con't  6/6- cannot keep on condom cath- will con't purewick per nursing 16. Constipation  5/29- will give sorbitol  30cc and if doesn't work, will give soap suds enema  5/30- LBM last night after soap suds enema- large  5/31- was disimpacted of 2 extra large Bms and continued to have BM's- Abdomen much less distended- feels better  6/7 moved bowels on 6/5 17. Hyperkalemia  6/6- K+ 5.2- stopped KCL- down to 4.8 today.--will not send home on potassium .       LOS: 14 days A FACE TO FACE EVALUATION WAS PERFORMED  Ranelle Oyster 09/24/2022, 11:24 AM

## 2022-09-24 NOTE — Progress Notes (Signed)
Inpatient Rehabilitation Discharge Medication Review by a Pharmacist  A complete drug regimen review was completed for this patient to identify any potential clinically significant medication issues.  High Risk Drug Classes Is patient taking? Indication by Medication  Antipsychotic No   Anticoagulant Yes Apixaban for afib   Antibiotic No   Opioid No   Antiplatelet Yes Aspirin for PAD   Hypoglycemics/insulin No   Vasoactive Medication No   Chemotherapy No   Other Yes Acetaminophen for mild pain Maalox for indigestion Atorvastatin for HLD Guaifenesin-dextromethorphan for cough Omega 3 for HLD Pantoprazole for GI prophylaxis Tamsulosin for BPH      Type of Medication Issue Identified Description of Issue Recommendation(s)  Drug Interaction(s) (clinically significant)     Duplicate Therapy     Allergy     No Medication Administration End Date     Incorrect Dose     Additional Drug Therapy Needed     Significant med changes from prior encounter (inform family/care partners about these prior to discharge). STOP benazepril, HCTZ NEW apixaban, vit C, atprvastatin, furosemide, multivitamin, pantoprazole, maalox, robitussin   Other  Consider stopping or decreasing pantoprazole      Clinically significant medication issues were identified that warrant physician communication and completion of prescribed/recommended actions by midnight of the next day:  No  Name of provider notified for urgent issues identified:   Provider Method of Notification:     Pharmacist comments:   Time spent performing this drug regimen review (minutes):  20  Alphia Moh, PharmD, [place], [facility] Clinical Pharmacist  Please check AMION for all [facility] Pharmacy phone numbers After 10:00 PM, call Main Pharmacy [phone]

## 2022-09-24 NOTE — Progress Notes (Signed)
Occupational Therapy Session Note  Patient Details  Name: [name]. MRN: [id number] Date of Birth: 05/18/1928  Today's Date: 09/24/2022 OT Individual Time: 7846-9629 OT Individual Time Calculation (min): 72 min    Short Term Goals: Week 2:  OT Short Term Goal 1 (Week 2): Patient will flex forward to reach to feet while seated in wheelchair with min assist OT Short Term Goal 2 (Week 2): Patient will transition from an elevated surface sitting to partial standing (as needed for dressing) with max assist OT Short Term Goal 3 (Week 2): In preparation for toileting, patient will complete squat pivot transfer with max assist OT Short Term Goal 4 (Week 2): Patient will don/doff upper body clothing with set up assistance and increased time.  Skilled Therapeutic Interventions/Progress Updates:    Pt resting in bed upon arrival. OT intervention with focus on BADLs, sit<>stand, standing balance, activity tolerance, and safety awareness. See below for bathing/dressing assist levels. Sit>stand with Stedy at mod A. Standing balance in RW at sink with mod A. Pt requires extended rest breaks between tasks. Pt engaged in BUE therex circuit with 4# bar 3x10 with rest breaks. Pt remained seated in w/c with all needs within reach.   Therapy Documentation Precautions:  Precautions Precautions: Fall Precaution Comments: L heel, R buttocks pressure injuries; HoH Required Braces or Orthoses: Other Brace Other Brace: R post op shoe Restrictions Weight Bearing Restrictions: No RLE Weight Bearing: Touchdown weight bearing Other Position/Activity Restrictions: HOH General:   Vital Signs:  Pain: Pain Assessment Pain Scale: 0-10 Pain Score: 0-No pain ADL: ADL Equipment Provided: Reacher, Sock aid Eating: Supervision/safety Where Assessed-Eating: Wheelchair Grooming: Supervision/safety Where Assessed-Grooming: Sitting at sink Upper Body Bathing: Supervision/safety Where Assessed-Upper Body  Bathing: Sitting at sink, Wheelchair Lower Body Bathing: Moderate cueing Where Assessed-Lower Body Bathing: Sitting at sink Upper Body Dressing: Supervision/safety Where Assessed-Upper Body Dressing: Sitting at sink, Wheelchair Lower Body Dressing: Maximal assistance Where Assessed-Lower Body Dressing: Sitting at sink, Standing at sink, Wheelchair Toileting: Maximal assistance Where Assessed-Toileting: Bed level Toilet Transfer: Maximal assistance Toilet Transfer Method: Stand pivot Acupuncturist: Grab bars Tub/Shower Transfer: Unable to assess Tub/Shower Transfer Method: Unable to assess [employer] Transfer: Maximal assistance Film/video editor Method: Stand pivot [employer]: Grab bars, Shower seat with back   Therapy/Group: Individual Therapy  [name] 09/24/2022, 1:45 PM

## 2022-09-27 DIAGNOSIS — M6281 Muscle weakness (generalized): Secondary | ICD-10-CM | POA: Diagnosis not present

## 2022-09-27 DIAGNOSIS — I739 Peripheral vascular disease, unspecified: Secondary | ICD-10-CM | POA: Diagnosis not present

## 2022-09-27 DIAGNOSIS — N400 Enlarged prostate without lower urinary tract symptoms: Secondary | ICD-10-CM | POA: Diagnosis not present

## 2022-09-27 DIAGNOSIS — I4891 Unspecified atrial fibrillation: Secondary | ICD-10-CM | POA: Diagnosis not present

## 2022-09-27 DIAGNOSIS — M869 Osteomyelitis, unspecified: Secondary | ICD-10-CM | POA: Diagnosis not present

## 2022-09-27 DIAGNOSIS — R5381 Other malaise: Secondary | ICD-10-CM | POA: Diagnosis not present

## 2022-09-29 DIAGNOSIS — N400 Enlarged prostate without lower urinary tract symptoms: Secondary | ICD-10-CM | POA: Diagnosis not present

## 2022-09-29 DIAGNOSIS — I739 Peripheral vascular disease, unspecified: Secondary | ICD-10-CM | POA: Diagnosis not present

## 2022-09-29 DIAGNOSIS — I4891 Unspecified atrial fibrillation: Secondary | ICD-10-CM | POA: Diagnosis not present

## 2022-09-29 DIAGNOSIS — R5381 Other malaise: Secondary | ICD-10-CM | POA: Diagnosis not present

## 2022-09-29 DIAGNOSIS — M6281 Muscle weakness (generalized): Secondary | ICD-10-CM | POA: Diagnosis not present

## 2022-09-29 DIAGNOSIS — M869 Osteomyelitis, unspecified: Secondary | ICD-10-CM | POA: Diagnosis not present

## 2022-09-30 DIAGNOSIS — R7881 Bacteremia: Secondary | ICD-10-CM | POA: Diagnosis not present

## 2022-09-30 DIAGNOSIS — D649 Anemia, unspecified: Secondary | ICD-10-CM | POA: Diagnosis not present

## 2022-09-30 DIAGNOSIS — I4891 Unspecified atrial fibrillation: Secondary | ICD-10-CM | POA: Diagnosis not present

## 2022-09-30 DIAGNOSIS — E871 Hypo-osmolality and hyponatremia: Secondary | ICD-10-CM | POA: Diagnosis not present

## 2022-09-30 DIAGNOSIS — M869 Osteomyelitis, unspecified: Secondary | ICD-10-CM | POA: Diagnosis not present

## 2022-09-30 DIAGNOSIS — E785 Hyperlipidemia, unspecified: Secondary | ICD-10-CM | POA: Diagnosis not present

## 2022-09-30 DIAGNOSIS — I739 Peripheral vascular disease, unspecified: Secondary | ICD-10-CM | POA: Diagnosis not present

## 2022-09-30 DIAGNOSIS — R600 Localized edema: Secondary | ICD-10-CM | POA: Diagnosis not present

## 2022-10-01 DIAGNOSIS — N400 Enlarged prostate without lower urinary tract symptoms: Secondary | ICD-10-CM | POA: Diagnosis not present

## 2022-10-01 DIAGNOSIS — I4891 Unspecified atrial fibrillation: Secondary | ICD-10-CM | POA: Diagnosis not present

## 2022-10-01 DIAGNOSIS — N39498 Other specified urinary incontinence: Secondary | ICD-10-CM | POA: Diagnosis not present

## 2022-10-01 DIAGNOSIS — R159 Full incontinence of feces: Secondary | ICD-10-CM | POA: Diagnosis not present

## 2022-10-01 DIAGNOSIS — M6281 Muscle weakness (generalized): Secondary | ICD-10-CM | POA: Diagnosis not present

## 2022-10-01 DIAGNOSIS — L89153 Pressure ulcer of sacral region, stage 3: Secondary | ICD-10-CM | POA: Diagnosis not present

## 2022-10-01 DIAGNOSIS — R609 Edema, unspecified: Secondary | ICD-10-CM | POA: Diagnosis not present

## 2022-10-01 DIAGNOSIS — M869 Osteomyelitis, unspecified: Secondary | ICD-10-CM | POA: Diagnosis not present

## 2022-10-01 DIAGNOSIS — I739 Peripheral vascular disease, unspecified: Secondary | ICD-10-CM | POA: Diagnosis not present

## 2022-10-01 DIAGNOSIS — R5381 Other malaise: Secondary | ICD-10-CM | POA: Diagnosis not present

## 2022-10-04 DIAGNOSIS — E46 Unspecified protein-calorie malnutrition: Secondary | ICD-10-CM | POA: Diagnosis not present

## 2022-10-04 DIAGNOSIS — R600 Localized edema: Secondary | ICD-10-CM | POA: Diagnosis not present

## 2022-10-05 ENCOUNTER — Ambulatory Visit (INDEPENDENT_AMBULATORY_CARE_PROVIDER_SITE_OTHER): Payer: Medicare Other | Admitting: Orthopedic Surgery

## 2022-10-05 DIAGNOSIS — Z89421 Acquired absence of other right toe(s): Secondary | ICD-10-CM

## 2022-10-06 DIAGNOSIS — L89153 Pressure ulcer of sacral region, stage 3: Secondary | ICD-10-CM | POA: Diagnosis not present

## 2022-10-06 DIAGNOSIS — I4891 Unspecified atrial fibrillation: Secondary | ICD-10-CM | POA: Diagnosis not present

## 2022-10-06 DIAGNOSIS — R5381 Other malaise: Secondary | ICD-10-CM | POA: Diagnosis not present

## 2022-10-06 DIAGNOSIS — N39498 Other specified urinary incontinence: Secondary | ICD-10-CM | POA: Diagnosis not present

## 2022-10-06 DIAGNOSIS — E785 Hyperlipidemia, unspecified: Secondary | ICD-10-CM | POA: Diagnosis not present

## 2022-10-06 DIAGNOSIS — M869 Osteomyelitis, unspecified: Secondary | ICD-10-CM | POA: Diagnosis not present

## 2022-10-06 DIAGNOSIS — R159 Full incontinence of feces: Secondary | ICD-10-CM | POA: Diagnosis not present

## 2022-10-06 DIAGNOSIS — R609 Edema, unspecified: Secondary | ICD-10-CM | POA: Diagnosis not present

## 2022-10-06 DIAGNOSIS — N400 Enlarged prostate without lower urinary tract symptoms: Secondary | ICD-10-CM | POA: Diagnosis not present

## 2022-10-06 DIAGNOSIS — R7881 Bacteremia: Secondary | ICD-10-CM | POA: Diagnosis not present

## 2022-10-06 DIAGNOSIS — M6281 Muscle weakness (generalized): Secondary | ICD-10-CM | POA: Diagnosis not present

## 2022-10-06 DIAGNOSIS — I739 Peripheral vascular disease, unspecified: Secondary | ICD-10-CM | POA: Diagnosis not present

## 2022-10-11 ENCOUNTER — Ambulatory Visit: Payer: Medicare Other | Admitting: Urology

## 2022-10-11 NOTE — Progress Notes (Signed)
History of Present Illness:   He had EBRT for PCA in 2003--unknown stage/grade. When first seen in 2015, he had been on androgen deprivation therapy with Lupron and his PSA was 2.65. Before his first meeting, he was cared for by Dr. Jerre Simon.  After his first visit, in Feb 2016 he had a bone scan which revealed no evidence of osseous metastatic disease. Bone density study did reveal osteoporosis. He was placed on alendronate 70 mg weekly.  His PSA in Feb 2016 was 2.70 (approximately what it was previously iin October, 2015). Testosterone levels were at castrate level. He had a repeat PSA in May, 2016 that was 2.9.  Because of increasing PSA (16 on 3.1.2017), he was restarted on androgen deprivation therapy in March, 2017. At that time, bone scan was negative for osseous metastatic disease.  In July, 2017 was down to 4.6.  CT scan in Nov 2017 revealed stable pulmonary nodules, no evidence of intra-abdominal or skeletal metastatic disease.    12.4.2018:  PSA is up to 13.7. It is been over a year since he has received a Lupron injection. He was restarted on Lupron at this appt.    4.9.2019: PSA 6.4. Lupron 30 mg administered    4.17.2019: bone scan negative for osseous metastatic disease.    4.26.2019: CT A/P negative for metastatic disease.    8.13.2019: PSA 5.5. He was given a 4 month Lupron injection.    12.17.2019: Lupron not given. PSA 6.2    4.28.2020: Most recent PSA 6.2  8.25.2020: PSA 20.4.   10.27.2020: Recent CT A/P, bone scan--NED.  5.25.2021: Reinitiated ADT. This decision was based on recently elevated PSA, which was measured at 42.1 on 4.6.2021. Eligard 30 mg administered.  9.28.2021: PSA - 6.4 2.1.2022: PSA - 7.7 6.7.2022: PSA 8.9   10.11.2022:  PSA 9.1, testosterone level castrate.  2.14.2023: Leuprolide 30 mg administered   2.18.2023--Bone scan- There are ill-defined patchy foci of interval increase in tracer uptake in the paraspinal region. Significance of this  finding is not clear. This may be due to abnormal soft tissue uptake or uptake in sternum. Follow-up chest radiographs and CT if warranted should be considered.   There are foci of increased uptake in both shoulders, both elbows and left hip suggesting degenerative arthritis. Photopenia seen in the right hip suggests previous right hip arthroplasty.     6.27.2023: PSA 9, T level 5.    11.14.2023: Biggest worry today is a lightness of his skin.  Mainly of his abdominal skin with some pruritus.  He has continued urinary frequency, urgency and near total urgency incontinence.  He has had no blood in his urine.  He has significantly limited mobility.  He also has nocturia.  No bony pain.  Stable weight, stable appetite.  He is taking his alendronate weekly.   3.19.2024: Here for routine check.  He is declining in his overall health.  He is in a wheelchair now.  No specific bony pain.  Past Medical History:  Diagnosis Date   Hypertension    Kidney calculus 2014;2013   Pneumonia    Prostate cancer [facility])    Shortness of breath     Past Surgical History:  Procedure Laterality Date   ABDOMINAL AORTOGRAM W/LOWER EXTREMITY N/A 09/02/2022   Procedure: ABDOMINAL AORTOGRAM W/LOWER EXTREMITY;  Surgeon: Victorino Sparrow, MD;  Location: [facility] INVASIVE CV LAB;  Service: Vascular;  Laterality: N/A;   AMPUTATION Right 09/08/2022   Procedure: RIGHT FOOT FIFTH RAY AMPUTATION;  Surgeon: Lajoyce Corners,  Randa Evens, MD;  Location: [facility] OR;  Service: Orthopedics;  Laterality: Right;   CATARACT EXTRACTION W/PHACO Right 02/13/2013   Procedure: CATARACT EXTRACTION RIGHT EYE (WITH PHACO) AND INTRAOCULAR LENS PLACEMENT  CDE=14.53;  Surgeon: Loraine Leriche T. Nile Riggs, MD;  Location: [facility] ORS;  Service: Ophthalmology;  Laterality: Right;   CATARACT EXTRACTION W/PHACO Left 02/27/2013   Procedure: CATARACT EXTRACTION PHACO AND INTRAOCULAR LENS PLACEMENT (IOC);  Surgeon: Loraine Leriche T. Nile Riggs, MD;  Location: [facility] ORS;  Service: Ophthalmology;  Laterality:  Left;  CDE:12.54   COLONOSCOPY     JOINT REPLACEMENT Right 1993;1980   Right x2    Home Medications:  Allergies as of 10/12/2022       Reactions   Codeine Swelling        Medication List        Accurate as of October 11, 2022  9:57 PM. If you have any questions, ask your nurse or doctor.          acetaminophen 500 MG tablet Commonly known as: TYLENOL Take 1 tablet (500 mg total) by mouth 2 (two) times daily with breakfast and lunch.   alum & mag hydroxide-simeth 200-200-20 MG/5ML suspension Commonly known as: MAALOX/MYLANTA Take 15-30 mLs by mouth every 2 (two) hours as needed for indigestion.   apixaban 5 MG Tabs tablet Commonly known as: ELIQUIS Take 1 tablet (5 mg total) by mouth 2 (two) times daily.   ascorbic acid 1000 MG tablet Commonly known as: VITAMIN C Take 1 tablet (1,000 mg total) by mouth daily.   aspirin EC 81 MG tablet Take 81 mg by mouth daily.   atorvastatin 40 MG tablet Commonly known as: LIPITOR Take 1 tablet (40 mg total) by mouth daily.   furosemide 20 MG tablet Commonly known as: LASIX Take 1 tablet (20 mg total) by mouth daily.   guaiFENesin-dextromethorphan 100-10 MG/5ML syrup Commonly known as: ROBITUSSIN DM Take 15 mLs by mouth every 4 (four) hours as needed for cough.   multivitamin with minerals Tabs tablet Take 1 tablet by mouth daily.   nutrition supplement (JUVEN) Pack Take 1 packet by mouth 2 (two) times daily between meals.   omega-3 acid ethyl esters 1 g capsule Commonly known as: LOVAZA Take 1 capsule (1 g total) by mouth daily.   pantoprazole 40 MG tablet Commonly known as: PROTONIX Take 1 tablet (40 mg total) by mouth 2 (two) times daily.   senna-docusate 8.6-50 MG tablet Commonly known as: Senokot-S Take 1 tablet by mouth 2 (two) times daily.   tamsulosin 0.4 MG Caps capsule Commonly known as: FLOMAX Take 0.4 mg by mouth daily after supper.        Allergies:  Allergies  Allergen Reactions   Codeine  Swelling    No family history on file.  Social History:  reports that he has never smoked. He has never used smokeless tobacco. He reports that he does not drink alcohol and does not use drugs.  ROS: A complete review of systems was performed.  All systems are negative except for pertinent findings as noted.  Physical Exam:  Vital signs in last 24 hours: There were no vitals taken for this visit. Constitutional:  Alert and oriented, No acute distress Cardiovascular: Regular rate.  He does have 3-4+ bilateral lower extremity edema. Respiratory: Normal respiratory effort Neurologic: Grossly intact, no focal deficits Psychiatric: Normal mood and affect  I have reviewed prior pt notes  I have reviewed urinalysis results  I have independently reviewed prior imaging  I have  reviewed prior PSA and testosterone results    Impression/Assessment:  Progressive prostate cancer following radiotherapy 21 years ago, on androgen deprivation therapy for about 10 years.  Last bone imaging was a year ago and was negative  Plan:  I will hold off on administering Lupron today-more than likely, he will recover from his androgen deprivation therapy and we can just follow his testosterone and PSA  I recommend bone scan to make sure that he does not have any impending skeletal related events-he wants to hold off on this and get back with me if he wants to proceed  Otherwise, I will have him come back in about 3 months for check

## 2022-10-12 ENCOUNTER — Ambulatory Visit (INDEPENDENT_AMBULATORY_CARE_PROVIDER_SITE_OTHER): Payer: Medicare Other | Admitting: Urology

## 2022-10-12 ENCOUNTER — Encounter: Payer: Self-pay | Admitting: Urology

## 2022-10-12 VITALS — BP 145/80 | HR 91

## 2022-10-12 DIAGNOSIS — N401 Enlarged prostate with lower urinary tract symptoms: Secondary | ICD-10-CM

## 2022-10-12 DIAGNOSIS — M818 Other osteoporosis without current pathological fracture: Secondary | ICD-10-CM | POA: Diagnosis not present

## 2022-10-12 DIAGNOSIS — R300 Dysuria: Secondary | ICD-10-CM | POA: Diagnosis not present

## 2022-10-12 DIAGNOSIS — E569 Vitamin deficiency, unspecified: Secondary | ICD-10-CM | POA: Diagnosis not present

## 2022-10-12 DIAGNOSIS — C61 Malignant neoplasm of prostate: Secondary | ICD-10-CM

## 2022-10-12 DIAGNOSIS — R9721 Rising PSA following treatment for malignant neoplasm of prostate: Secondary | ICD-10-CM | POA: Diagnosis not present

## 2022-10-12 DIAGNOSIS — N138 Other obstructive and reflux uropathy: Secondary | ICD-10-CM

## 2022-10-12 DIAGNOSIS — Z8546 Personal history of malignant neoplasm of prostate: Secondary | ICD-10-CM

## 2022-10-12 DIAGNOSIS — K5900 Constipation, unspecified: Secondary | ICD-10-CM | POA: Diagnosis not present

## 2022-10-12 DIAGNOSIS — N319 Neuromuscular dysfunction of bladder, unspecified: Secondary | ICD-10-CM | POA: Diagnosis not present

## 2022-10-12 DIAGNOSIS — Z978 Presence of other specified devices: Secondary | ICD-10-CM | POA: Diagnosis not present

## 2022-10-12 DIAGNOSIS — K219 Gastro-esophageal reflux disease without esophagitis: Secondary | ICD-10-CM | POA: Diagnosis not present

## 2022-10-13 ENCOUNTER — Ambulatory Visit: Payer: Medicare Other | Admitting: Cardiology

## 2022-10-13 DIAGNOSIS — N39498 Other specified urinary incontinence: Secondary | ICD-10-CM | POA: Diagnosis not present

## 2022-10-13 DIAGNOSIS — M869 Osteomyelitis, unspecified: Secondary | ICD-10-CM | POA: Diagnosis not present

## 2022-10-13 DIAGNOSIS — M6281 Muscle weakness (generalized): Secondary | ICD-10-CM | POA: Diagnosis not present

## 2022-10-13 DIAGNOSIS — N400 Enlarged prostate without lower urinary tract symptoms: Secondary | ICD-10-CM | POA: Diagnosis not present

## 2022-10-13 DIAGNOSIS — L89153 Pressure ulcer of sacral region, stage 3: Secondary | ICD-10-CM | POA: Diagnosis not present

## 2022-10-13 DIAGNOSIS — R609 Edema, unspecified: Secondary | ICD-10-CM | POA: Diagnosis not present

## 2022-10-13 DIAGNOSIS — R5381 Other malaise: Secondary | ICD-10-CM | POA: Diagnosis not present

## 2022-10-13 DIAGNOSIS — R7881 Bacteremia: Secondary | ICD-10-CM | POA: Diagnosis not present

## 2022-10-13 DIAGNOSIS — I4891 Unspecified atrial fibrillation: Secondary | ICD-10-CM | POA: Diagnosis not present

## 2022-10-13 DIAGNOSIS — I739 Peripheral vascular disease, unspecified: Secondary | ICD-10-CM | POA: Diagnosis not present

## 2022-10-13 DIAGNOSIS — E785 Hyperlipidemia, unspecified: Secondary | ICD-10-CM | POA: Diagnosis not present

## 2022-10-13 DIAGNOSIS — R159 Full incontinence of feces: Secondary | ICD-10-CM | POA: Diagnosis not present

## 2022-10-13 NOTE — Progress Notes (Signed)
Patient referred by Elfredia Nevins, MD for ***  Subjective:   Jesse Pollock Sr., male    DOB: April 30, 1928, 89 y.o.   MRN: [id number]  *** Chief Complaint  Patient presents with   Edema   Weight Gain   New Patient (Initial Visit)    *** HPI  89 y.o.  African-American male with hypertension, prostate cancer, BPH, PAD, now with weight gain and leg edema.  Patient was hospitalized in 08/2022 with CLI-necrotic ulcer fifth metatarsal head right foot, with underlying osteomyelitis.  He underwent PTA to left TP trunk.  ***  *** Past Medical History:  Diagnosis Date   Hypertension    Kidney calculus 2014;2013   Pneumonia    Prostate cancer (HCC)    Shortness of breath     *** Past Surgical History:  Procedure Laterality Date   ABDOMINAL AORTOGRAM W/LOWER EXTREMITY N/A 09/02/2022   Procedure: ABDOMINAL AORTOGRAM W/LOWER EXTREMITY;  Surgeon: Victorino Sparrow, MD;  [facility] INVASIVE CV LAB;  Service: Vascular;  Laterality: N/A;   AMPUTATION Right 09/08/2022   Procedure: RIGHT FOOT FIFTH RAY AMPUTATION;  Surgeon: [name], MD;  Location: [facility] OR;  Service: Orthopedics;  Laterality: Right;   CATARACT EXTRACTION W/PHACO Right 02/13/2013   Procedure: CATARACT EXTRACTION RIGHT EYE (WITH PHACO) AND INTRAOCULAR LENS PLACEMENT  CDE=14.53;  Surgeon: Loraine Leriche T. Nile Riggs, MD;  Location: [facility] ORS;  Service: Ophthalmology;  Laterality: Right;   CATARACT EXTRACTION W/PHACO Left 02/27/2013   Procedure: CATARACT EXTRACTION PHACO AND INTRAOCULAR LENS PLACEMENT (IOC);  Surgeon: Loraine Leriche T. Nile Riggs, MD;  Location: [facility] ORS;  Service: Ophthalmology;  Laterality: Left;  CDE:12.54   COLONOSCOPY     JOINT REPLACEMENT Right 1993;1980   Right x2    *** Social History   Tobacco Use  Smoking Status Never  Smokeless Tobacco Never    Social History   Substance and Sexual Activity  Alcohol Use No    *** No family history on file.  ***  Current Outpatient Medications:    acetaminophen (TYLENOL)  500 MG tablet, Take 1 tablet (500 mg total) by mouth 2 (two) times daily with breakfast and lunch., Disp: 30 tablet, Rfl: 0   alum & mag hydroxide-simeth (MAALOX/MYLANTA) 200-200-20 MG/5ML suspension, Take 15-30 mLs by mouth every 2 (two) hours as needed for indigestion., Disp: 355 mL, Rfl: 0   apixaban (ELIQUIS) 5 MG TABS tablet, Take 1 tablet (5 mg total) by mouth 2 (two) times daily., Disp: 60 tablet, Rfl:    ascorbic acid (VITAMIN C) 1000 MG tablet, Take 1 tablet (1,000 mg total) by mouth daily., Disp: , Rfl:    aspirin EC 81 MG tablet, Take 81 mg by mouth daily., Disp: , Rfl:    atorvastatin (LIPITOR) 40 MG tablet, Take 1 tablet (40 mg total) by mouth daily., Disp: , Rfl:    furosemide (LASIX) 20 MG tablet, Take 1 tablet (20 mg total) by mouth daily., Disp: 30 tablet, Rfl:    guaiFENesin-dextromethorphan (ROBITUSSIN DM) 100-10 MG/5ML syrup, Take 15 mLs by mouth every 4 (four) hours as needed for cough., Disp: 118 mL, Rfl: 0   Multiple Vitamin (MULTIVITAMIN WITH MINERALS) TABS tablet, Take 1 tablet by mouth daily., Disp: , Rfl:    nutrition supplement, JUVEN, (JUVEN) PACK, Take 1 packet by mouth 2 (two) times daily between meals., Disp: , Rfl: 0   omega-3 acid ethyl esters (LOVAZA) 1 g capsule, Take 1 capsule (1 g total) by mouth daily., Disp: , Rfl:  pantoprazole (PROTONIX) 40 MG tablet, Take 1 tablet (40 mg total) by mouth 2 (two) times daily., Disp: , Rfl:    senna-docusate (SENOKOT-S) 8.6-50 MG tablet, Take 1 tablet by mouth 2 (two) times daily., Disp: , Rfl:    tamsulosin (FLOMAX) 0.4 MG CAPS capsule, Take 0.4 mg by mouth daily after supper., Disp: , Rfl:    Cardiovascular and other pertinent studies:  Reviewed external labs and tests, independently interpreted  *** EKG ***/***/202***: ***  Peripheral interval 09/02/2022: 1.  Ultrasound-guided micropuncture access of the left common femoral artery in retrograde fashion 2.  Aortogram 3.  Second order cannulation, right lower  extremity angiogram 4.  Third order cannulation, right lower extremity angiogram from the superficial femoral artery 5.  Third order cannulation right lower extremity angiogram from the posterior tibial artery 6.  Balloon angioplasty of the tibioperoneal trunk 3 x 60 mm balloon 7.  Balloon angioplasty of the posterior tibial artery 2 x 220 mm balloon 8.  Device assisted closure-Pro-glide  Echocardiogram 08/26/2022: 1. Left ventricular ejection fraction, by estimation, is 65 to 70%. The  left ventricle has normal function. The left ventricle has no regional  wall motion abnormalities. There is mild left ventricular hypertrophy.  Left ventricular diastolic parameters  are indeterminate.   2. Right ventricular systolic function is normal. The right ventricular  size is normal. There is normal pulmonary artery systolic pressure. The  estimated right ventricular systolic pressure is 14.2 mmHg.   3. Left atrial size was mildly dilated.   4. The mitral valve is degenerative. Mild mitral valve regurgitation.   5. The aortic valve is tricuspid. Aortic valve regurgitation is trivial.   6. The inferior vena cava is normal in size with <50% respiratory  variability, suggesting right atrial pressure of 8 mmHg.   ABI 08/28/2022: Summary:  Right: Resting right ankle-brachial index indicates moderate right lower  extremity arterial disease. The right toe-brachial index is abnormal.   Left: Resting left ankle-brachial index is within normal range. The left  toe-brachial index is abnormal.  *** Recent labs: 09/23/2022: Glucose 96, BUN/Cr 26/0.92. EGFR >60. Na/K 133/4.8.  H/H 10.3/31.2. MCV 103. Platelets 315 ***HbA1C ***% Chol ***, TG ***, HDL ***, LDL *** ***TSH ***normal   *** ROS      *** There were no vitals filed for this visit.   There is no height or weight on file to calculate BMI. There were no vitals filed for this visit.  *** Objective:   Physical Exam    ***     Visit  diagnoses: No diagnosis found.   No orders of the defined types were placed in this encounter.    Medication changes this visit: There are no discontinued medications.  No orders of the defined types were placed in this encounter.    Assessment & Recommendations:   ***  ***  Thank you for referring the patient to [place]. Please feel free to contact with any questions.   Elder Negus, MD Pager: [phone] Office: [phone]

## 2022-10-14 ENCOUNTER — Encounter: Payer: Self-pay | Admitting: Orthopedic Surgery

## 2022-10-14 NOTE — Progress Notes (Signed)
Office Visit Note   Patient: Jesse BRUUN Sr.           Date of Birth: 1929-01-27           MRN: [id number] Visit Date: 10/05/2022              Requested by: Elfredia Nevins, MD [address] [place],  [place] [address] PCP: Elfredia Nevins, MD  Chief Complaint  Patient presents with   Right Foot - Routine Post Op    09/08/2022 right foot 5th ray amputation       HPI: Patient is a 89 year old gentleman who is 4 weeks status post right foot fifth ray amputation.  Assessment & Plan: Visit Diagnoses:  1. History of partial ray amputation of fifth toe of right foot (HCC)     Plan: Sutures are harvested start weightbearing as tolerated in a postoperative shoe.  Follow-Up Instructions: Return in about 4 weeks (around 11/02/2022).   Ortho Exam  Patient is alert, oriented, no adenopathy, well-dressed, normal affect, normal respiratory effort. Examination the incision is well-healed no cellulitis no drainage.  The sutures are harvested.  Imaging: No results found. No images are attached to the encounter.  Labs: Lab Results  Component Value Date   CRP 9.1 (H) 08/27/2022   CRP 12.1 (H) 08/26/2022   REPTSTATUS 08/31/2022 FINAL 08/26/2022   REPTSTATUS 08/31/2022 FINAL 08/26/2022   CULT  08/26/2022    NO GROWTH 5 DAYS Performed at [facility], [address] [place], [place] [address]    CULT  08/26/2022    NO GROWTH 5 DAYS Performed at [id number], [address], [place], [place] [address]    [facility] ESCHERICHIA COLI 01/19/2008     Lab Results  Component Value Date   ALBUMIN 2.5 (L) 09/16/2022   ALBUMIN 2.4 (L) 09/11/2022   ALBUMIN 2.5 (L) 09/06/2022    Lab Results  Component Value Date   MG 2.1 09/06/2022   MG 1.9 08/25/2022   MG 2.1 08/24/2022   No results found for: "VD25OH"  No results found for: "PREALBUMIN"    Latest Ref Rng & Units 09/20/2022    6:38 AM 09/18/2022    5:49 AM 09/17/2022    6:26 AM  CBC EXTENDED  WBC 4.0 - 10.5 K/uL 7.0  8.2   7.6   RBC 4.22 - 5.81 MIL/uL 3.03  3.96  3.23    3.21   Hemoglobin 13.0 - 17.0 g/dL 74.2  59.5  63.8   HCT 39.0 - 52.0 % 31.2  41.7  31.5   Platelets 150 - 400 K/uL 315  137  371   NEUT# 1.7 - 7.7 K/uL 3.3  5.5  3.5   Lymph# 0.7 - 4.0 K/uL 2.7  1.4  2.9      There is no height or weight on file to calculate BMI.  Orders:  No orders of the defined types were placed in this encounter.  No orders of the defined types were placed in this encounter.    Procedures: No procedures performed  Clinical Data: No additional findings.  ROS:  All other systems negative, except as noted in the HPI. Review of Systems  Objective: Vital Signs: There were no vitals taken for this visit.  Specialty Comments:  No specialty comments available.  PMFS History: Patient Active Problem List   Diagnosis Date Noted   Leg edema 09/23/2022   Hyponatremia 09/23/2022   Azotemia 09/22/2022   Prostate cancer (HCC) 09/22/2022   Chronic anticoagulation 09/17/2022  Anemia 09/16/2022   Constipation 09/16/2022   Debility 09/10/2022   PAD (peripheral artery disease) (HCC) 09/01/2022   Osteomyelitis of fifth toe of right foot (HCC) 08/31/2022   Bacteremia 08/31/2022   Acute cystitis without hematuria 08/31/2022   Polymicrobial bacterial infection 08/26/2022   Fever of undetermined origin 08/25/2022   Lactic acidosis 08/25/2022   Generalized weakness 08/25/2022   Bronchitis 08/25/2022   Bilateral pleural effusion 08/25/2022   Right foot ulcer (HCC) 08/25/2022   Essential hypertension 08/25/2022   BPH (benign prostatic hyperplasia) 08/25/2022   Atrial fibrillation (HCC) 08/25/2022   Past Medical History:  Diagnosis Date   Hypertension    Kidney calculus 2014;2013   Pneumonia    Prostate cancer (HCC)    Shortness of breath     History reviewed. No pertinent family history.  Past Surgical History:  Procedure Laterality Date   ABDOMINAL AORTOGRAM W/LOWER EXTREMITY N/A 09/02/2022    Procedure: ABDOMINAL AORTOGRAM W/LOWER EXTREMITY;  Surgeon: Victorino Sparrow, MD;  Location: [facility] INVASIVE CV LAB;  Service: Vascular;  Laterality: N/A;   AMPUTATION Right 09/08/2022   Procedure: RIGHT FOOT FIFTH RAY AMPUTATION;  Surgeon: [name], MD;  Location: [facility] OR;  Service: Orthopedics;  Laterality: Right;   CATARACT EXTRACTION W/PHACO Right 02/13/2013   Procedure: CATARACT EXTRACTION RIGHT EYE (WITH PHACO) AND INTRAOCULAR LENS PLACEMENT  CDE=14.53;  Surgeon: Loraine Leriche T. Nile Riggs, MD;  Location: [facility] ORS;  Service: Ophthalmology;  Laterality: Right;   CATARACT EXTRACTION W/PHACO Left 02/27/2013   Procedure: CATARACT EXTRACTION PHACO AND INTRAOCULAR LENS PLACEMENT (IOC);  Surgeon: Loraine Leriche T. Nile Riggs, MD;  Location: [facility] ORS;  Service: Ophthalmology;  Laterality: Left;  CDE:12.54   COLONOSCOPY     JOINT REPLACEMENT Right 1993;1980   Right x2   Social History   Occupational History   Not on file  Tobacco Use   Smoking status: Never   Smokeless tobacco: Never  Vaping Use   Vaping Use: Never used  Substance and Sexual Activity   Alcohol use: No   Drug use: No   Sexual activity: Not Currently

## 2022-10-15 ENCOUNTER — Ambulatory Visit: Payer: Medicare Other | Admitting: Cardiology

## 2022-10-15 DIAGNOSIS — I739 Peripheral vascular disease, unspecified: Secondary | ICD-10-CM | POA: Diagnosis not present

## 2022-10-15 DIAGNOSIS — R5381 Other malaise: Secondary | ICD-10-CM | POA: Diagnosis not present

## 2022-10-15 DIAGNOSIS — M869 Osteomyelitis, unspecified: Secondary | ICD-10-CM | POA: Diagnosis not present

## 2022-10-15 DIAGNOSIS — M6281 Muscle weakness (generalized): Secondary | ICD-10-CM | POA: Diagnosis not present

## 2022-10-15 DIAGNOSIS — E785 Hyperlipidemia, unspecified: Secondary | ICD-10-CM | POA: Diagnosis not present

## 2022-10-15 DIAGNOSIS — I4891 Unspecified atrial fibrillation: Secondary | ICD-10-CM | POA: Diagnosis not present

## 2022-10-15 DIAGNOSIS — N390 Urinary tract infection, site not specified: Secondary | ICD-10-CM | POA: Diagnosis not present

## 2022-10-15 DIAGNOSIS — R7881 Bacteremia: Secondary | ICD-10-CM | POA: Diagnosis not present

## 2022-10-18 DIAGNOSIS — R7881 Bacteremia: Secondary | ICD-10-CM | POA: Diagnosis not present

## 2022-10-18 DIAGNOSIS — N390 Urinary tract infection, site not specified: Secondary | ICD-10-CM | POA: Diagnosis not present

## 2022-10-18 DIAGNOSIS — M869 Osteomyelitis, unspecified: Secondary | ICD-10-CM | POA: Diagnosis not present

## 2022-10-18 DIAGNOSIS — L89153 Pressure ulcer of sacral region, stage 3: Secondary | ICD-10-CM | POA: Diagnosis not present

## 2022-10-18 DIAGNOSIS — N39498 Other specified urinary incontinence: Secondary | ICD-10-CM | POA: Diagnosis not present

## 2022-10-18 DIAGNOSIS — M6281 Muscle weakness (generalized): Secondary | ICD-10-CM | POA: Diagnosis not present

## 2022-10-18 DIAGNOSIS — I4891 Unspecified atrial fibrillation: Secondary | ICD-10-CM | POA: Diagnosis not present

## 2022-10-18 DIAGNOSIS — R159 Full incontinence of feces: Secondary | ICD-10-CM | POA: Diagnosis not present

## 2022-10-18 DIAGNOSIS — R5381 Other malaise: Secondary | ICD-10-CM | POA: Diagnosis not present

## 2022-10-18 DIAGNOSIS — E785 Hyperlipidemia, unspecified: Secondary | ICD-10-CM | POA: Diagnosis not present

## 2022-10-18 DIAGNOSIS — R609 Edema, unspecified: Secondary | ICD-10-CM | POA: Diagnosis not present

## 2022-10-18 DIAGNOSIS — I739 Peripheral vascular disease, unspecified: Secondary | ICD-10-CM | POA: Diagnosis not present

## 2022-10-20 DIAGNOSIS — I4891 Unspecified atrial fibrillation: Secondary | ICD-10-CM | POA: Diagnosis not present

## 2022-10-20 DIAGNOSIS — M6281 Muscle weakness (generalized): Secondary | ICD-10-CM | POA: Diagnosis not present

## 2022-10-20 DIAGNOSIS — M869 Osteomyelitis, unspecified: Secondary | ICD-10-CM | POA: Diagnosis not present

## 2022-10-20 DIAGNOSIS — R5381 Other malaise: Secondary | ICD-10-CM | POA: Diagnosis not present

## 2022-10-20 DIAGNOSIS — I739 Peripheral vascular disease, unspecified: Secondary | ICD-10-CM | POA: Diagnosis not present

## 2022-10-22 DIAGNOSIS — M6281 Muscle weakness (generalized): Secondary | ICD-10-CM | POA: Diagnosis not present

## 2022-10-22 DIAGNOSIS — N390 Urinary tract infection, site not specified: Secondary | ICD-10-CM | POA: Diagnosis not present

## 2022-10-22 DIAGNOSIS — I739 Peripheral vascular disease, unspecified: Secondary | ICD-10-CM | POA: Diagnosis not present

## 2022-10-22 DIAGNOSIS — K219 Gastro-esophageal reflux disease without esophagitis: Secondary | ICD-10-CM | POA: Diagnosis not present

## 2022-10-27 DIAGNOSIS — L89153 Pressure ulcer of sacral region, stage 3: Secondary | ICD-10-CM | POA: Diagnosis not present

## 2022-10-27 DIAGNOSIS — R609 Edema, unspecified: Secondary | ICD-10-CM | POA: Diagnosis not present

## 2022-10-27 DIAGNOSIS — I739 Peripheral vascular disease, unspecified: Secondary | ICD-10-CM | POA: Diagnosis not present

## 2022-10-27 DIAGNOSIS — M6281 Muscle weakness (generalized): Secondary | ICD-10-CM | POA: Diagnosis not present

## 2022-10-27 DIAGNOSIS — R159 Full incontinence of feces: Secondary | ICD-10-CM | POA: Diagnosis not present

## 2022-10-27 DIAGNOSIS — N39498 Other specified urinary incontinence: Secondary | ICD-10-CM | POA: Diagnosis not present

## 2022-10-28 NOTE — Progress Notes (Signed)
Inpatient Rehabilitation Care Coordinator Assessment and Plan Patient Details  Name: [name]. MRN: [id number] Date of Birth: 1928/06/01  Today's Date: 10/28/2022  Hospital Problems: Principal Problem:   Debility Active Problems:   Atrial fibrillation (HCC)   Bacteremia   PAD (peripheral artery disease) (HCC)   Anemia   Constipation   Chronic anticoagulation   Azotemia   Prostate cancer (HCC)   Leg edema   Hyponatremia  Past Medical History:  Past Medical History:  Diagnosis Date   Hypertension    Kidney calculus 2014;2013   Pneumonia    Prostate cancer (HCC)    Shortness of breath    Past Surgical History:  Past Surgical History:  Procedure Laterality Date   ABDOMINAL AORTOGRAM W/LOWER EXTREMITY N/A 09/02/2022   Procedure: ABDOMINAL AORTOGRAM W/LOWER EXTREMITY;  Surgeon: Jesse Sparrow, MD;  Location: [facility] INVASIVE CV LAB;  Service: Vascular;  Laterality: N/A;   AMPUTATION Right 09/08/2022   Procedure: RIGHT FOOT FIFTH RAY AMPUTATION;  Surgeon: Jesse Mustard, MD;  Location: [facility] OR;  Service: Orthopedics;  Laterality: Right;   CATARACT EXTRACTION W/PHACO Right 02/13/2013   Procedure: CATARACT EXTRACTION RIGHT EYE (WITH PHACO) AND INTRAOCULAR LENS PLACEMENT  CDE=14.53;  Surgeon: Jesse Leriche T. Nile Riggs, MD;  Location: [facility] ORS;  Service: Ophthalmology;  Laterality: Right;   CATARACT EXTRACTION W/PHACO Left 02/27/2013   Procedure: CATARACT EXTRACTION PHACO AND INTRAOCULAR LENS PLACEMENT (IOC);  Surgeon: Jesse Leriche T. Nile Riggs, MD;  Location: [facility] ORS;  Service: Ophthalmology;  Laterality: Left;  CDE:12.54   COLONOSCOPY     JOINT REPLACEMENT Right 1993;1980   Right x2   Social History:  reports that he has never smoked. He has never used smokeless tobacco. He reports that he does not drink alcohol and does not use drugs.  Family / Support Systems Marital Status: Widow/Widower Patient Roles: Parent Spouse/Significant Other: Widowed Children: 2 adult children Other Supports: Day  support from pt son and his wife Anticipated Caregiver: Dtr [name] Ability/Limitations of Caregiver: [name] is physically not able to provide care to him. She has been calling her son and her son's father asking them to help him with transfers in/out of the bed, to commode, etc. Caregiver Availability: 24/7 Family Dynamics: Pt lives with his daughter.  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 Pt is HOH and dependent on cognition. Recent Psychosocial Issues: Denies Psychiatric History: Denies Substance Abuse History: Denies  Patient / Family Perceptions, Expectations & Goals Pt/Family understanding of illness & functional limitations: Pt family has a general understanding of care needs Premorbid pt/family roles/activities: Dependent Anticipated changes in roles/activities/participation: Continued dependent support with IADLs/ADLs Pt/family expectations/goals: Pt family's goal is for himto become more physically independent, as his daughter is the primary caregiver.  Community Resources [employer]: None Premorbid Home Care/DME Agencies:  None Transportation  available at discharge: TBD Is the patient able to respond to transportation needs?: Yes In the past 12 months, has lack of transportation kept you from medical appointments or from getting medications?: No In the past 12 months, has lack of transportation kept you from meetings, work, or from getting things needed for daily living?: No Resource referrals recommended: Neuropsychology  Discharge Planning Living Arrangements: Children Support Systems: Children Type of Residence: Private residence Insurance Resources: [employer] Financial Resources: Social Security Financial Screen Referred: No Living Expenses: Own Money Management: Family Does the patient have any problems obtaining your medications?: No Home Management: Pt dtr manages all home care needs. Patient/Family Preliminary Plans: TBD Care Coordinator Barriers to Discharge: Decreased caregiver support, Lack of/limited family support Care Coordinator Anticipated Follow Up Needs: HH/OP  Clinical Impression Assessment completed utilizing collateral reports.  [name] A [name] 10/28/2022, 3:13 PM

## 2022-10-29 ENCOUNTER — Encounter
Payer: Medicare Other | Attending: Physical Medicine and Rehabilitation | Admitting: Physical Medicine and Rehabilitation

## 2022-10-29 ENCOUNTER — Encounter: Payer: Self-pay | Admitting: Physical Medicine and Rehabilitation

## 2022-10-29 VITALS — BP 105/47 | HR 66 | Ht 67.0 in | Wt 190.0 lb

## 2022-10-29 DIAGNOSIS — M869 Osteomyelitis, unspecified: Secondary | ICD-10-CM | POA: Insufficient documentation

## 2022-10-29 DIAGNOSIS — N390 Urinary tract infection, site not specified: Secondary | ICD-10-CM | POA: Diagnosis not present

## 2022-10-29 DIAGNOSIS — I4891 Unspecified atrial fibrillation: Secondary | ICD-10-CM | POA: Diagnosis not present

## 2022-10-29 DIAGNOSIS — G7281 Critical illness myopathy: Secondary | ICD-10-CM | POA: Diagnosis not present

## 2022-10-29 DIAGNOSIS — R531 Weakness: Secondary | ICD-10-CM | POA: Insufficient documentation

## 2022-10-29 DIAGNOSIS — S98131A Complete traumatic amputation of one right lesser toe, initial encounter: Secondary | ICD-10-CM | POA: Insufficient documentation

## 2022-10-29 DIAGNOSIS — I4819 Other persistent atrial fibrillation: Secondary | ICD-10-CM | POA: Insufficient documentation

## 2022-10-29 DIAGNOSIS — I739 Peripheral vascular disease, unspecified: Secondary | ICD-10-CM | POA: Diagnosis not present

## 2022-10-29 DIAGNOSIS — K219 Gastro-esophageal reflux disease without esophagitis: Secondary | ICD-10-CM | POA: Diagnosis not present

## 2022-10-29 DIAGNOSIS — M6281 Muscle weakness (generalized): Secondary | ICD-10-CM | POA: Diagnosis not present

## 2022-10-29 DIAGNOSIS — N401 Enlarged prostate with lower urinary tract symptoms: Secondary | ICD-10-CM | POA: Diagnosis not present

## 2022-10-29 NOTE — Patient Instructions (Addendum)
Pt is a 89 yr old male with recent rehab admission for sepsis related to R 5th MTP osteomyelitis and ray amputation 09/08/22 .  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   Discussed possibility of Spinal cord compression that could be causing weakness vs debility from not doing much at nursing home vs ICU myopathy- where weakness much worse than expected.  Weakness is significantly worse than was in CIR/acute rehab.   Try tramadol 50 mg 2-3x/day as needed-should work with the last Cr/BUN I saw in hospital, unless it's worse now.  I think it's realistic to try and use, even if makes a little more sleepy.  I think it's worth to give unless really makes confused.    2. Con't Extra strength Tylenol suggest scheduled 2-3x/day-since not good at asking for meds and prn. Use first-  and only use tramadol if it doesn't work.    3. Is cold due to Eliquis for blood thinner- it makes everyone cold unfortunately, never seen a fix-  on for Afib.   I suggest a heated handwarmer- usually heated by battery- I don't like heating pads because they increase risk of Adolph more than handwarmers from what I've seen. D/w daughter.   4.  To see Dr Lajoyce Corners 7/22 as follow up.   5. Doesn't have pacemaker.   6.  Current UTI- on Cipro- con't per nursing facility.   7. I strongly suggest that patient needs more PT/OT- he truly needs 2 hours /day if possible- because he has a new dx (it's been progressing) of ICU myopathy causing Severe LE weakness- the best treatment for this is frequent physical therapy as well as Occupational therapy to improve Strength- ability to transfer, etc. I agree no sliding board transfer until Sacral wound is healed better, however, is there any way to use a Corene Cornea (once can weight  bear) more and work on strengthening legs-  and go from there.   8.  Nursing home should order home health when he leaves, a hospital bed; and call me BEFORE he leaves- 7-10 days before he leaves and I can see if we can get him a loaner w/c-    9. Would call Barbara Cower from [facility] for w/c loaner and w/c evaluation. L/M for him about neding loaner and w/c eval.    10. F/u in 2-3 months-  Double appt- Icu myopathy  11. Think ICU myopathy most likely diagnosis, however if you want me to order thoracic/lumbar MRI just to make sure, needs to be out of nursing home to do so.

## 2022-10-29 NOTE — Progress Notes (Signed)
Subjective:    Patient ID: Jesse Pollock Sr., male    DOB: 1928-06-30, 89 y.o.   MRN: [id number]  HPI   Pt is a 89 yr old male with recent rehab admission for sepsis rleated to R 5th MTP osteomyelitis and ray amputation 09/08/22 .  Was having Azotemia and hyponatremia in rehab;  Also has ABLA, B12 deficiency, fluid overload with chronic LE edema; prostate CA in past with chronic incontinence; new onset Afib;  Here for hospital f/u on debility    Pain is 7/10-  in neck- and L shoulder and R foot and B/L feet from sitting on w/c foot rests per pt.  Don't know why- in hospital, only rates pain 2-3/10 in hospital.    Biggest thing he has is being cold.  "Is worse now"- per daughterWindell Moulding.  Esp after 2-3 pm. Per pt.     Sleeping OK per pt.   Daughter, Windell Moulding, is trying to figure out how to get him home. He wants to go to his home- and "live life out there".   Daughter Doesn't want to go through Medicaid. Has to figure out something.  Daughter has to wash all clothes and feeds him, because he doesn't like food at SNF.  At a SNF that daughter knows everyone and "pops" up 24/7 if need be.   Hasn't been able to take shower or bath- can wash at sink and wants to do for himself.    When hits leg on L, will get muscle jumping on other side. Sounds like has crossed DTR's? Used ot have cramps a lot- runs in family.    Pain Inventory Average Pain 7 Pain Right Now 7 My pain is intermittent, sharp, burning, tingling, and aching  In the last 24 hours, has pain interfered with the following? General activity  in a wheelchair/bed Relation with others  3 Enjoyment of life 3 What TIME of day is your pain at its worst? varies Sleep (in general) Fair  Pain is worse with: bending, sitting, inactivity, standing, and some activites Pain improves with: rest and medication Relief from Meds: 5  ability to climb steps?  yes do you drive?  no use a wheelchair needs help with transfers Do you  have any goals in this area?  no  retired I need assistance with the following:  dressing, bathing, toileting, meal prep, household duties, and shopping Do you have any goals in this area?  yes  bladder control problems bowel control problems weakness numbness tremor trouble walking confusion  no  Hospital follow up    No family history on file. Social History   Socioeconomic History   Marital status: Widowed    Spouse name: Not on file   Number of children: Not on file   Years of education: Not on file   Highest education level: Not on file  Occupational History   Not on file  Tobacco Use   Smoking status: Never   Smokeless tobacco: Never  Vaping Use   Vaping status: Never Used  Substance and Sexual Activity   Alcohol use: No   Drug use: No   Sexual activity: Not Currently  Other Topics Concern   Not on file  Social History Narrative   Not on file   Social Determinants of Health   Financial Resource Strain: Low Risk  (07/24/2019)   Overall Financial Resource Strain (CARDIA)    Difficulty of Paying Living Expenses: Not hard at all  Food Insecurity: No Food  Insecurity (08/25/2022)   Hunger Vital Sign    Worried About Running Out of Food in the Last Year: Never true    Ran Out of Food in the Last Year: Never true  Transportation Needs: No Transportation Needs (08/25/2022)   PRAPARE - Administrator, Civil Service (Medical): No    Lack of Transportation (Non-Medical): No  Physical Activity: Inactive (07/24/2019)   Exercise Vital Sign    Days of Exercise per Week: 0 days    Minutes of Exercise per Session: 0 min  Stress: No Stress Concern Present (07/24/2019)   [employer] of Occupational Health - Occupational Stress Questionnaire    Feeling of Stress : Not at all  Social Connections: Moderately Integrated (07/24/2019)   Social Connection and Isolation Panel [NHANES]    Frequency of Communication with Friends and Family: More than three times a week     Frequency of Social Gatherings with Friends and Family: More than three times a week    Attends Religious Services: More than 4 times per year    Active Member of [employer] or Organizations: Yes    Attends Banker Meetings: 1 to 4 times per year    Marital Status: Widowed   Past Surgical History:  Procedure Laterality Date   ABDOMINAL AORTOGRAM W/LOWER EXTREMITY N/A 09/02/2022   Procedure: ABDOMINAL AORTOGRAM W/LOWER EXTREMITY;  Surgeon: Victorino Sparrow, MD;  Location: [facility] INVASIVE CV LAB;  Service: Vascular;  Laterality: N/A;   AMPUTATION Right 09/08/2022   Procedure: RIGHT FOOT FIFTH RAY AMPUTATION;  Surgeon: [name], MD;  Location: [facility] OR;  Service: Orthopedics;  Laterality: Right;   CATARACT EXTRACTION W/PHACO Right 02/13/2013   Procedure: CATARACT EXTRACTION RIGHT EYE (WITH PHACO) AND INTRAOCULAR LENS PLACEMENT  CDE=14.53;  Surgeon: Loraine Leriche T. Nile Riggs, MD;  Location: [facility] ORS;  Service: Ophthalmology;  Laterality: Right;   CATARACT EXTRACTION W/PHACO Left 02/27/2013   Procedure: CATARACT EXTRACTION PHACO AND INTRAOCULAR LENS PLACEMENT (IOC);  Surgeon: Loraine Leriche T. Nile Riggs, MD;  Location: [facility] ORS;  Service: Ophthalmology;  Laterality: Left;  CDE:12.54   COLONOSCOPY     JOINT REPLACEMENT Right 1993;1980   Right x2   Past Medical History:  Diagnosis Date   Hypertension    Kidney calculus 2014;2013   Pneumonia    Prostate cancer (HCC)    Shortness of breath    BP (!) 105/47   Pulse 66   Ht 5\' 7"  (1.702 m)   Wt 190 lb (86.2 kg)   SpO2 93%   BMI 29.76 kg/m   Opioid Risk Score:   Fall Risk Score:  `1  Depression screen [facility] 2/9     07/24/2019    3:04 PM  Depression screen [facility] 2/9  Decreased Interest 0  Down, Depressed, Hopeless 0  PHQ - 2 Score 0      Review of Systems  Respiratory:  Positive for cough and shortness of breath.   Cardiovascular:  Positive for leg swelling.  Gastrointestinal:  Positive for constipation.  Musculoskeletal:  Positive for neck pain.        B/L shoulder pain hip pain  Skin:        Skin breakdown       Objective:   Physical Exam Awake, but keeps closing eyes and tipping head down in sleeping position; in manual wc accompanied by daughter, NAD Hoyer lift sling under pt in w/c Has ACE wrap with kerlex underneath of R foot- Blood blister on top of L great toe- looks  like has popped- A lot of dead skin sloughing off L foot (Still working on Sacral pressure ulcer)- cannot assess  MS: Ue's 4+/5 in biceps, triceps and WE/Grip and FA 5-/5 LE's- HF 2-/5; limited by weight lof legs and weakness/pain; but think it's true weakness KE 2 to 2-/5 and KF 2-/5 DF 2/5 and PF 3-/5  Neuro: Absent DTR's- no clonus in LE's        Assessment & Plan:   Pt is a 89 yr old male with recent rehab admission for sepsis related to R 5th MTP osteomyelitis and ray amputation 09/08/22 .  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   Discussed possibility of Spinal cord compression that could be causing weakness vs debility from not doing much at nursing home vs ICU myopathy- where weakness much worse than expected.  Weakness is significantly worse than was in CIR/acute rehab.   Try tramadol 50 mg 2-3x/day as needed-should work with the last Cr/BUN I saw in hospital, unless it's worse now.  I think it's realistic to try and use, even if makes a little more sleepy.  I think it's worth to give unless really makes confused.    2. Con't Extra strength Tylenol suggest scheduled 2-3x/day-since not good at asking for meds and prn. Use first-  and only use tramadol if it doesn't work.    3. Is cold due to Eliquis for blood thinner- it makes everyone cold unfortunately, never seen a fix-  on for Afib.   I suggest a heated handwarmer-  usually heated by battery- I don't like heating pads because they increase risk of [place] more than handwarmers from what I've seen. D/w daughter.   4.  To see Dr Lajoyce Corners 7/22 as follow up.   5. Doesn't have pacemaker.   6.  Current UTI- on Cipro- con't per nursing facility.   7. I strongly suggest that patient needs more PT/OT- he truly needs 2 hours /day if possible- because he has a new dx (it's been progressing) of ICU myopathy causing Severe LE weakness- the best treatment for this is frequent physical therapy as well as Occupational therapy to improve Strength- ability to transfer, etc. I agree no sliding board transfer until Sacral wound is healed better, however, is there any way to use a Corene Cornea (once can weight bear) more and work on strengthening legs-  and go from there.   8.  Nursing home should order home health when he leaves, a hospital bed; and call me BEFORE he leaves- 7-10 days before he leaves and I can see if we can get him a loaner w/c-    9. Would call Barbara Cower from [facility] for w/c loaner and w/c evaluation. L/M for him about neding loaner and w/c eval.    10. F/u in 2-3 months-  Double appt- Icu myopathy   I spent a total of 49   minutes on total care today- >50% coordination of care- due to  49 minutes discussing possible lumbar compression but most likely ICU myopathy-  and how to address since getting home. And getting him loaner w/c- hopefully- will need PT eval for w/c as well.

## 2022-11-01 DIAGNOSIS — M6281 Muscle weakness (generalized): Secondary | ICD-10-CM | POA: Diagnosis not present

## 2022-11-01 DIAGNOSIS — E785 Hyperlipidemia, unspecified: Secondary | ICD-10-CM | POA: Diagnosis not present

## 2022-11-01 DIAGNOSIS — E46 Unspecified protein-calorie malnutrition: Secondary | ICD-10-CM | POA: Diagnosis not present

## 2022-11-01 DIAGNOSIS — E569 Vitamin deficiency, unspecified: Secondary | ICD-10-CM | POA: Diagnosis not present

## 2022-11-01 DIAGNOSIS — N390 Urinary tract infection, site not specified: Secondary | ICD-10-CM | POA: Diagnosis not present

## 2022-11-01 DIAGNOSIS — I4891 Unspecified atrial fibrillation: Secondary | ICD-10-CM | POA: Diagnosis not present

## 2022-11-03 DIAGNOSIS — I4891 Unspecified atrial fibrillation: Secondary | ICD-10-CM | POA: Diagnosis not present

## 2022-11-03 DIAGNOSIS — I739 Peripheral vascular disease, unspecified: Secondary | ICD-10-CM | POA: Diagnosis not present

## 2022-11-03 DIAGNOSIS — R5381 Other malaise: Secondary | ICD-10-CM | POA: Diagnosis not present

## 2022-11-03 DIAGNOSIS — N39498 Other specified urinary incontinence: Secondary | ICD-10-CM | POA: Diagnosis not present

## 2022-11-03 DIAGNOSIS — E569 Vitamin deficiency, unspecified: Secondary | ICD-10-CM | POA: Diagnosis not present

## 2022-11-03 DIAGNOSIS — L89153 Pressure ulcer of sacral region, stage 3: Secondary | ICD-10-CM | POA: Diagnosis not present

## 2022-11-03 DIAGNOSIS — N390 Urinary tract infection, site not specified: Secondary | ICD-10-CM | POA: Diagnosis not present

## 2022-11-03 DIAGNOSIS — R159 Full incontinence of feces: Secondary | ICD-10-CM | POA: Diagnosis not present

## 2022-11-03 DIAGNOSIS — M6281 Muscle weakness (generalized): Secondary | ICD-10-CM | POA: Diagnosis not present

## 2022-11-03 DIAGNOSIS — R609 Edema, unspecified: Secondary | ICD-10-CM | POA: Diagnosis not present

## 2022-11-05 DIAGNOSIS — M6281 Muscle weakness (generalized): Secondary | ICD-10-CM | POA: Diagnosis not present

## 2022-11-05 DIAGNOSIS — E569 Vitamin deficiency, unspecified: Secondary | ICD-10-CM | POA: Diagnosis not present

## 2022-11-05 DIAGNOSIS — R5381 Other malaise: Secondary | ICD-10-CM | POA: Diagnosis not present

## 2022-11-05 DIAGNOSIS — N390 Urinary tract infection, site not specified: Secondary | ICD-10-CM | POA: Diagnosis not present

## 2022-11-05 DIAGNOSIS — I4891 Unspecified atrial fibrillation: Secondary | ICD-10-CM | POA: Diagnosis not present

## 2022-11-08 ENCOUNTER — Encounter: Payer: Self-pay | Admitting: Cardiology

## 2022-11-08 ENCOUNTER — Ambulatory Visit: Payer: Medicare Other | Admitting: Cardiology

## 2022-11-08 VITALS — BP 133/62 | HR 73 | Resp 16 | Ht 67.0 in

## 2022-11-08 DIAGNOSIS — R600 Localized edema: Secondary | ICD-10-CM

## 2022-11-08 DIAGNOSIS — I4811 Longstanding persistent atrial fibrillation: Secondary | ICD-10-CM | POA: Diagnosis not present

## 2022-11-08 DIAGNOSIS — I739 Peripheral vascular disease, unspecified: Secondary | ICD-10-CM

## 2022-11-08 NOTE — Progress Notes (Addendum)
Patient referred by Elfredia Nevins, MD for leg swelling  Subjective:   Jesse Pollock Sr., male    DOB: 02-Oct-1928, 89 y.o.   MRN: [id number]   Chief Complaint  Patient presents with   Leg Swelling   New Patient (Initial Visit)     HPI  89 y.o.  African-American male with hypertension, persistent Afib, prostate cancer, BPH, PAD, now with leg edema.  Patient was hospitalized in 08/2022 with CLI-necrotic ulcer fifth metatarsal head right foot, with underlying osteomyelitis.  He underwent PTA to Rt TP trunk.  He has been in rehab since then.  Patient is here with his daughter. He has had poor functional capacity for last several weeks. He has not been able to do any meaningful walking, even though he is now allowed weight bearing on his right foot. He gets regular dressings. He has indwelling Foley catheter due to urinary retention. He gets follow up with urology. Leg swelling has improved with lasix. He denies any shortness of breath.    Past Medical History:  Diagnosis Date   Hypertension    Kidney calculus 2014;2013   Pneumonia    Prostate cancer [facility])    Shortness of breath      Past Surgical History:  Procedure Laterality Date   ABDOMINAL AORTOGRAM W/LOWER EXTREMITY N/A 09/02/2022   Procedure: ABDOMINAL AORTOGRAM W/LOWER EXTREMITY;  Surgeon: Victorino Sparrow, MD;  Location: [facility] INVASIVE CV LAB;  Service: Vascular;  Laterality: N/A;   AMPUTATION Right 09/08/2022   Procedure: RIGHT FOOT FIFTH RAY AMPUTATION;  Surgeon: [name], MD;  Location: [facility] OR;  Service: Orthopedics;  Laterality: Right;   CATARACT EXTRACTION W/PHACO Right 02/13/2013   Procedure: CATARACT EXTRACTION RIGHT EYE (WITH PHACO) AND INTRAOCULAR LENS PLACEMENT  CDE=14.53;  Surgeon: Loraine Leriche T. Nile Riggs, MD;  Location: [facility];  Service: Ophthalmology;  Laterality: Right;   CATARACT EXTRACTION W/PHACO Left 02/27/2013   Procedure: CATARACT EXTRACTION PHACO AND INTRAOCULAR LENS PLACEMENT (IOC);  Surgeon: Loraine Leriche T.  Nile Riggs, MD;  Location: [facility];  Service: Ophthalmology;  Laterality: Left;  CDE:12.54   COLONOSCOPY     JOINT REPLACEMENT Right 1993;1980   Right x2     Social History   Tobacco Use  Smoking Status Never  Smokeless Tobacco Never    Social History   Substance and Sexual Activity  Alcohol Use No     History reviewed. No pertinent family history.    Current Outpatient Medications:    acetaminophen (TYLENOL) 500 MG tablet, Take 1 tablet (500 mg total) by mouth 2 (two) times daily with breakfast and lunch., Disp: 30 tablet, Rfl: 0   alum & mag hydroxide-simeth (MAALOX/MYLANTA) 200-200-20 MG/5ML suspension, Take 15-30 mLs by mouth every 2 (two) hours as needed for indigestion., Disp: 355 mL, Rfl: 0   apixaban (ELIQUIS) 5 MG TABS tablet, Take 1 tablet (5 mg total) by mouth 2 (two) times daily., Disp: 60 tablet, Rfl:    ascorbic acid (VITAMIN C) 1000 MG tablet, Take 1 tablet (1,000 mg total) by mouth daily., Disp: , Rfl:    atorvastatin (LIPITOR) 40 MG tablet, Take 1 tablet (40 mg total) by mouth daily., Disp: , Rfl:    furosemide (LASIX) 20 MG tablet, Take 1 tablet (20 mg total) by mouth daily., Disp: 30 tablet, Rfl:    Multiple Vitamin (MULTIVITAMIN WITH MINERALS) TABS tablet, Take 1 tablet by mouth daily., Disp: , Rfl:    nutrition supplement, JUVEN, (JUVEN) PACK, Take 1 packet by mouth 2 (two) times  daily between meals., Disp: , Rfl: 0   omega-3 acid ethyl esters (LOVAZA) 1 g capsule, Take 1 capsule (1 g total) by mouth daily., Disp: , Rfl:    pantoprazole (PROTONIX) 40 MG tablet, Take 1 tablet (40 mg total) by mouth 2 (two) times daily., Disp: , Rfl:    senna-docusate (SENOKOT-S) 8.6-50 MG tablet, Take 1 tablet by mouth 2 (two) times daily., Disp: , Rfl:    tamsulosin (FLOMAX) 0.4 MG CAPS capsule, Take 0.4 mg by mouth daily after supper., Disp: , Rfl:    aspirin EC 81 MG tablet, Take 81 mg by mouth daily., Disp: , Rfl:    Cardiovascular and other pertinent studies:  Reviewed  external labs and tests, independently interpreted  EKG 11/08/2022: Atrial fibrillation 80 bpm Occasional ectopic ventricular beat   Nonspecific T-abnormality   Peripheral intervention 09/02/2022: 1.  Ultrasound-guided micropuncture access of the left common femoral artery in retrograde fashion 2.  Aortogram 3.  Second order cannulation, right lower extremity angiogram 4.  Third order cannulation, right lower extremity angiogram from the superficial femoral artery 5.  Third order cannulation right lower extremity angiogram from the posterior tibial artery 6.  Balloon angioplasty of the tibioperoneal trunk 3 x 60 mm balloon 7.  Balloon angioplasty of the posterior tibial artery 2 x 220 mm balloon 8.  Device assisted closure-Pro-glide  Echocardiogram 08/26/2022: 1. Left ventricular ejection fraction, by estimation, is 65 to 70%. The  left ventricle has normal function. The left ventricle has no regional  wall motion abnormalities. There is mild left ventricular hypertrophy.  Left ventricular diastolic parameters  are indeterminate.   2. Right ventricular systolic function is normal. The right ventricular  size is normal. There is normal pulmonary artery systolic pressure. The  estimated right ventricular systolic pressure is 14.2 mmHg.   3. Left atrial size was mildly dilated.   4. The mitral valve is degenerative. Mild mitral valve regurgitation.   5. The aortic valve is tricuspid. Aortic valve regurgitation is trivial.   6. The inferior vena cava is normal in size with <50% respiratory  variability, suggesting right atrial pressure of 8 mmHg.   ABI 08/28/2022: Summary:  Right: Resting right ankle-brachial index indicates moderate right lower  extremity arterial disease. The right toe-brachial index is abnormal.   Left: Resting left ankle-brachial index is within normal range. The left  toe-brachial index is abnormal.   Recent labs: 09/23/2022: Glucose 96, BUN/Cr 26/0.92. EGFR >60.  Na/K 133/4.8.  H/H 10.3/31.2. MCV 103. Platelets 315 BNP 196    Review of Systems  Cardiovascular:  Positive for leg swelling. Negative for chest pain, dyspnea on exertion, palpitations and syncope.         Vitals:   11/08/22 0929  BP: 133/62  Pulse: 73  Resp: 16     Body mass index is 29.76 kg/m.   Objective:   Physical Exam Vitals and nursing note reviewed.  Constitutional:      General: He is not in acute distress.    Comments: Wheelchair  Neck:     Vascular: No JVD.  Cardiovascular:     Rate and Rhythm: Normal rate and regular rhythm.     Heart sounds: Normal heart sounds. No murmur heard. Pulmonary:     Effort: Pulmonary effort is normal.     Breath sounds: Normal breath sounds. No wheezing or rales.  Musculoskeletal:     Right lower leg: Edema (1+) present.     Left lower leg: Edema (1+) present.  Visit diagnoses:   ICD-10-CM   1. Leg edema  R60.0 EKG 12-Lead    2. Longstanding persistent atrial fibrillation (HCC)  I48.11     3. PAD (peripheral artery disease) (HCC)  I73.9        Orders Placed This Encounter  Procedures   EKG 12-Lead     Medication changes this visit: Medications Discontinued During This Encounter  Medication Reason   guaiFENesin-dextromethorphan (ROBITUSSIN DM) 100-10 MG/5ML syrup     No orders of the defined types were placed in this encounter.    Assessment & Recommendations:   89 y.o.  African-American male with hypertension, persistent Afib, prostate cancer, BPH, PAD, now with leg edema.  Leg edema: At least partly attributed to poor mobility. No profound LV dysfunction on recent echocardiogram. Continue PO lasix, can adjust 20-240 mg if worsening leg edema. No further cardiac workup necessary at this time. Increase mobility, if possible.   Persistent Afib: Rate controlled. CHA2DS2VASc score 5, annual stroke risk 7.2%.  Continue eliquis 5 mg bid.   PAD: Continue management as per vascular  surgery. On Aspirin 81 mg daily.   Continue follow up with urology.  I will see him on as needed basis.    Thank you for referring the patient to [place]. Please feel free to contact with any questions.   Elder Negus, MD Pager: [phone] Office: [phone]

## 2022-11-10 DIAGNOSIS — N39498 Other specified urinary incontinence: Secondary | ICD-10-CM | POA: Diagnosis not present

## 2022-11-10 DIAGNOSIS — I739 Peripheral vascular disease, unspecified: Secondary | ICD-10-CM | POA: Diagnosis not present

## 2022-11-10 DIAGNOSIS — L89150 Pressure ulcer of sacral region, unstageable: Secondary | ICD-10-CM | POA: Diagnosis not present

## 2022-11-10 DIAGNOSIS — R609 Edema, unspecified: Secondary | ICD-10-CM | POA: Diagnosis not present

## 2022-11-10 DIAGNOSIS — M6281 Muscle weakness (generalized): Secondary | ICD-10-CM | POA: Diagnosis not present

## 2022-11-10 DIAGNOSIS — R159 Full incontinence of feces: Secondary | ICD-10-CM | POA: Diagnosis not present

## 2022-11-11 DIAGNOSIS — R5381 Other malaise: Secondary | ICD-10-CM | POA: Diagnosis not present

## 2022-11-11 DIAGNOSIS — M869 Osteomyelitis, unspecified: Secondary | ICD-10-CM | POA: Diagnosis not present

## 2022-11-11 DIAGNOSIS — I4891 Unspecified atrial fibrillation: Secondary | ICD-10-CM | POA: Diagnosis not present

## 2022-11-11 DIAGNOSIS — E569 Vitamin deficiency, unspecified: Secondary | ICD-10-CM | POA: Diagnosis not present

## 2022-11-11 DIAGNOSIS — E46 Unspecified protein-calorie malnutrition: Secondary | ICD-10-CM | POA: Diagnosis not present

## 2022-11-11 DIAGNOSIS — M6281 Muscle weakness (generalized): Secondary | ICD-10-CM | POA: Diagnosis not present

## 2022-11-11 DIAGNOSIS — S31000A Unspecified open wound of lower back and pelvis without penetration into retroperitoneum, initial encounter: Secondary | ICD-10-CM | POA: Diagnosis not present

## 2022-11-11 DIAGNOSIS — N390 Urinary tract infection, site not specified: Secondary | ICD-10-CM | POA: Diagnosis not present

## 2022-11-15 DIAGNOSIS — L89150 Pressure ulcer of sacral region, unstageable: Secondary | ICD-10-CM | POA: Diagnosis not present

## 2022-11-15 DIAGNOSIS — N39498 Other specified urinary incontinence: Secondary | ICD-10-CM | POA: Diagnosis not present

## 2022-11-15 DIAGNOSIS — I739 Peripheral vascular disease, unspecified: Secondary | ICD-10-CM | POA: Diagnosis not present

## 2022-11-15 DIAGNOSIS — R159 Full incontinence of feces: Secondary | ICD-10-CM | POA: Diagnosis not present

## 2022-11-15 DIAGNOSIS — R609 Edema, unspecified: Secondary | ICD-10-CM | POA: Diagnosis not present

## 2022-11-15 DIAGNOSIS — M6281 Muscle weakness (generalized): Secondary | ICD-10-CM | POA: Diagnosis not present

## 2022-11-22 DIAGNOSIS — L89620 Pressure ulcer of left heel, unstageable: Secondary | ICD-10-CM | POA: Diagnosis not present

## 2022-11-22 DIAGNOSIS — R609 Edema, unspecified: Secondary | ICD-10-CM | POA: Diagnosis not present

## 2022-11-22 DIAGNOSIS — L89150 Pressure ulcer of sacral region, unstageable: Secondary | ICD-10-CM | POA: Diagnosis not present

## 2022-11-22 DIAGNOSIS — I739 Peripheral vascular disease, unspecified: Secondary | ICD-10-CM | POA: Diagnosis not present

## 2022-11-22 DIAGNOSIS — L89616 Pressure-induced deep tissue damage of right heel: Secondary | ICD-10-CM | POA: Diagnosis not present

## 2022-11-22 DIAGNOSIS — R159 Full incontinence of feces: Secondary | ICD-10-CM | POA: Diagnosis not present

## 2022-11-22 DIAGNOSIS — M6281 Muscle weakness (generalized): Secondary | ICD-10-CM | POA: Diagnosis not present

## 2022-11-22 DIAGNOSIS — N39498 Other specified urinary incontinence: Secondary | ICD-10-CM | POA: Diagnosis not present

## 2022-11-22 DIAGNOSIS — L89896 Pressure-induced deep tissue damage of other site: Secondary | ICD-10-CM | POA: Diagnosis not present

## 2022-11-24 ENCOUNTER — Encounter (HOSPITAL_COMMUNITY): Payer: Self-pay

## 2022-11-24 ENCOUNTER — Emergency Department (HOSPITAL_COMMUNITY): Payer: Medicare Other

## 2022-11-24 ENCOUNTER — Other Ambulatory Visit: Payer: Self-pay

## 2022-11-24 ENCOUNTER — Inpatient Hospital Stay (HOSPITAL_COMMUNITY)
Admission: EM | Admit: 2022-11-24 | Discharge: 2022-11-30 | DRG: 698 | Disposition: A | Discharge status: Skilled Nursing Facility | Payer: Medicare Other | Attending: Internal Medicine | Admitting: Internal Medicine

## 2022-11-24 DIAGNOSIS — E43 Unspecified severe protein-calorie malnutrition: Secondary | ICD-10-CM | POA: Diagnosis not present

## 2022-11-24 DIAGNOSIS — L8990 Pressure ulcer of unspecified site, unspecified stage: Secondary | ICD-10-CM | POA: Diagnosis present

## 2022-11-24 DIAGNOSIS — Z7401 Bed confinement status: Secondary | ICD-10-CM | POA: Diagnosis not present

## 2022-11-24 DIAGNOSIS — N3001 Acute cystitis with hematuria: Secondary | ICD-10-CM | POA: Diagnosis present

## 2022-11-24 DIAGNOSIS — I959 Hypotension, unspecified: Secondary | ICD-10-CM | POA: Diagnosis not present

## 2022-11-24 DIAGNOSIS — D649 Anemia, unspecified: Secondary | ICD-10-CM | POA: Diagnosis present

## 2022-11-24 DIAGNOSIS — R4182 Altered mental status, unspecified: Secondary | ICD-10-CM | POA: Diagnosis not present

## 2022-11-24 DIAGNOSIS — R6521 Severe sepsis with septic shock: Secondary | ICD-10-CM | POA: Diagnosis present

## 2022-11-24 DIAGNOSIS — Y846 Urinary catheterization as the cause of abnormal reaction of the patient, or of later complication, without mention of misadventure at the time of the procedure: Secondary | ICD-10-CM | POA: Diagnosis present

## 2022-11-24 DIAGNOSIS — Z1152 Encounter for screening for COVID-19: Secondary | ICD-10-CM | POA: Diagnosis not present

## 2022-11-24 DIAGNOSIS — N400 Enlarged prostate without lower urinary tract symptoms: Secondary | ICD-10-CM | POA: Diagnosis present

## 2022-11-24 DIAGNOSIS — L89154 Pressure ulcer of sacral region, stage 4: Secondary | ICD-10-CM | POA: Diagnosis present

## 2022-11-24 DIAGNOSIS — R7989 Other specified abnormal findings of blood chemistry: Secondary | ICD-10-CM | POA: Diagnosis present

## 2022-11-24 DIAGNOSIS — K219 Gastro-esophageal reflux disease without esophagitis: Secondary | ICD-10-CM | POA: Diagnosis present

## 2022-11-24 DIAGNOSIS — R7881 Bacteremia: Secondary | ICD-10-CM | POA: Diagnosis present

## 2022-11-24 DIAGNOSIS — I10 Essential (primary) hypertension: Secondary | ICD-10-CM | POA: Diagnosis not present

## 2022-11-24 DIAGNOSIS — E8809 Other disorders of plasma-protein metabolism, not elsewhere classified: Secondary | ICD-10-CM | POA: Diagnosis present

## 2022-11-24 DIAGNOSIS — I700 Atherosclerosis of aorta: Secondary | ICD-10-CM | POA: Diagnosis not present

## 2022-11-24 DIAGNOSIS — L89610 Pressure ulcer of right heel, unstageable: Secondary | ICD-10-CM | POA: Diagnosis present

## 2022-11-24 DIAGNOSIS — A419 Sepsis, unspecified organism: Secondary | ICD-10-CM | POA: Diagnosis present

## 2022-11-24 DIAGNOSIS — R5381 Other malaise: Secondary | ICD-10-CM | POA: Diagnosis not present

## 2022-11-24 DIAGNOSIS — Z681 Body mass index (BMI) 19 or less, adult: Secondary | ICD-10-CM

## 2022-11-24 DIAGNOSIS — D62 Acute posthemorrhagic anemia: Secondary | ICD-10-CM | POA: Diagnosis not present

## 2022-11-24 DIAGNOSIS — Z89421 Acquired absence of other right toe(s): Secondary | ICD-10-CM | POA: Diagnosis not present

## 2022-11-24 DIAGNOSIS — T83511A Infection and inflammatory reaction due to indwelling urethral catheter, initial encounter: Principal | ICD-10-CM | POA: Diagnosis present

## 2022-11-24 DIAGNOSIS — I739 Peripheral vascular disease, unspecified: Secondary | ICD-10-CM | POA: Diagnosis present

## 2022-11-24 DIAGNOSIS — I480 Paroxysmal atrial fibrillation: Secondary | ICD-10-CM | POA: Diagnosis present

## 2022-11-24 DIAGNOSIS — R61 Generalized hyperhidrosis: Secondary | ICD-10-CM | POA: Diagnosis not present

## 2022-11-24 DIAGNOSIS — Z515 Encounter for palliative care: Secondary | ICD-10-CM | POA: Diagnosis not present

## 2022-11-24 DIAGNOSIS — G9341 Metabolic encephalopathy: Secondary | ICD-10-CM | POA: Diagnosis present

## 2022-11-24 DIAGNOSIS — I4891 Unspecified atrial fibrillation: Secondary | ICD-10-CM | POA: Diagnosis present

## 2022-11-24 DIAGNOSIS — N390 Urinary tract infection, site not specified: Secondary | ICD-10-CM | POA: Diagnosis present

## 2022-11-24 DIAGNOSIS — I70221 Atherosclerosis of native arteries of extremities with rest pain, right leg: Secondary | ICD-10-CM | POA: Diagnosis present

## 2022-11-24 DIAGNOSIS — R791 Abnormal coagulation profile: Secondary | ICD-10-CM | POA: Diagnosis present

## 2022-11-24 DIAGNOSIS — B964 Proteus (mirabilis) (morganii) as the cause of diseases classified elsewhere: Secondary | ICD-10-CM | POA: Diagnosis present

## 2022-11-24 DIAGNOSIS — Z7901 Long term (current) use of anticoagulants: Secondary | ICD-10-CM | POA: Diagnosis not present

## 2022-11-24 DIAGNOSIS — R609 Edema, unspecified: Secondary | ICD-10-CM | POA: Diagnosis present

## 2022-11-24 DIAGNOSIS — L89312 Pressure ulcer of right buttock, stage 2: Secondary | ICD-10-CM | POA: Diagnosis not present

## 2022-11-24 DIAGNOSIS — E871 Hypo-osmolality and hyponatremia: Secondary | ICD-10-CM | POA: Diagnosis not present

## 2022-11-24 DIAGNOSIS — E872 Acidosis, unspecified: Secondary | ICD-10-CM | POA: Diagnosis not present

## 2022-11-24 DIAGNOSIS — S98131A Complete traumatic amputation of one right lesser toe, initial encounter: Secondary | ICD-10-CM | POA: Diagnosis present

## 2022-11-24 DIAGNOSIS — R1311 Dysphagia, oral phase: Secondary | ICD-10-CM | POA: Diagnosis present

## 2022-11-24 DIAGNOSIS — K5900 Constipation, unspecified: Secondary | ICD-10-CM | POA: Diagnosis present

## 2022-11-24 DIAGNOSIS — Z87440 Personal history of urinary (tract) infections: Secondary | ICD-10-CM

## 2022-11-24 DIAGNOSIS — I70229 Atherosclerosis of native arteries of extremities with rest pain, unspecified extremity: Secondary | ICD-10-CM | POA: Diagnosis present

## 2022-11-24 DIAGNOSIS — R531 Weakness: Secondary | ICD-10-CM | POA: Diagnosis not present

## 2022-11-24 DIAGNOSIS — C61 Malignant neoplasm of prostate: Secondary | ICD-10-CM | POA: Diagnosis present

## 2022-11-24 DIAGNOSIS — Z7189 Other specified counseling: Secondary | ICD-10-CM | POA: Diagnosis not present

## 2022-11-24 DIAGNOSIS — Z961 Presence of intraocular lens: Secondary | ICD-10-CM | POA: Diagnosis present

## 2022-11-24 DIAGNOSIS — N179 Acute kidney failure, unspecified: Secondary | ICD-10-CM | POA: Diagnosis present

## 2022-11-24 DIAGNOSIS — K922 Gastrointestinal hemorrhage, unspecified: Secondary | ICD-10-CM | POA: Diagnosis not present

## 2022-11-24 DIAGNOSIS — Z4781 Encounter for orthopedic aftercare following surgical amputation: Secondary | ICD-10-CM | POA: Diagnosis not present

## 2022-11-24 DIAGNOSIS — Z885 Allergy status to narcotic agent status: Secondary | ICD-10-CM

## 2022-11-24 DIAGNOSIS — Z87891 Personal history of nicotine dependence: Secondary | ICD-10-CM

## 2022-11-24 DIAGNOSIS — J40 Bronchitis, not specified as acute or chronic: Secondary | ICD-10-CM | POA: Diagnosis present

## 2022-11-24 DIAGNOSIS — Z79899 Other long term (current) drug therapy: Secondary | ICD-10-CM

## 2022-11-24 DIAGNOSIS — R41841 Cognitive communication deficit: Secondary | ICD-10-CM | POA: Diagnosis present

## 2022-11-24 DIAGNOSIS — R410 Disorientation, unspecified: Secondary | ICD-10-CM | POA: Diagnosis not present

## 2022-11-24 DIAGNOSIS — A4150 Gram-negative sepsis, unspecified: Secondary | ICD-10-CM | POA: Diagnosis present

## 2022-11-24 DIAGNOSIS — M6281 Muscle weakness (generalized): Secondary | ICD-10-CM | POA: Diagnosis present

## 2022-11-24 DIAGNOSIS — R2681 Unsteadiness on feet: Secondary | ICD-10-CM | POA: Diagnosis present

## 2022-11-24 DIAGNOSIS — M869 Osteomyelitis, unspecified: Secondary | ICD-10-CM | POA: Diagnosis present

## 2022-11-24 HISTORY — DX: Paroxysmal atrial fibrillation: I48.0

## 2022-11-24 HISTORY — DX: Presence of other specified devices: Z97.8

## 2022-11-24 HISTORY — DX: Peripheral vascular disease, unspecified: I73.9

## 2022-11-24 LAB — URINALYSIS, W/ REFLEX TO CULTURE (INFECTION SUSPECTED)
Bilirubin Urine: NEGATIVE
Glucose, UA: NEGATIVE mg/dL
Ketones, ur: NEGATIVE mg/dL
Nitrite: NEGATIVE
Protein, ur: >=300 mg/dL — AB
Specific Gravity, Urine: 1.009 (ref 1.005–1.030)
WBC, UA: >50 WBC/hpf (ref 0–5)
pH: 8 (ref 5.0–8.0)

## 2022-11-24 LAB — MAGNESIUM: Magnesium: 1.8 mg/dL (ref 1.7–2.4)

## 2022-11-24 LAB — CBC WITH DIFFERENTIAL/PLATELET
Abs Immature Granulocytes: 0 10*3/uL (ref 0.00–0.07)
Band Neutrophils: 3 %
Basophils Absolute: 0 10*3/uL (ref 0.0–0.1)
Basophils Relative: 0 %
Eosinophils Absolute: 0 10*3/uL (ref 0.0–0.5)
Eosinophils Relative: 0 %
HCT: 18.6 % — ABNORMAL LOW (ref 39.0–52.0)
Hemoglobin: 6 g/dL — CL (ref 13.0–17.0)
Lymphocytes Relative: 6 %
Lymphs Abs: 2.3 10*3/uL (ref 0.7–4.0)
MCH: 34.9 pg — ABNORMAL HIGH (ref 26.0–34.0)
MCHC: 32.3 g/dL (ref 30.0–36.0)
MCV: 108.1 fL — ABNORMAL HIGH (ref 80.0–100.0)
Monocytes Absolute: 1.2 10*3/uL — ABNORMAL HIGH (ref 0.1–1.0)
Monocytes Relative: 3 %
Neutro Abs: 35.5 10*3/uL — ABNORMAL HIGH (ref 1.7–7.7)
Neutrophils Relative %: 88 %
Platelets: 398 10*3/uL (ref 150–400)
RBC: 1.72 MIL/uL — ABNORMAL LOW (ref 4.22–5.81)
RDW: 18.6 % — ABNORMAL HIGH (ref 11.5–15.5)
WBC: 39 10*3/uL — ABNORMAL HIGH (ref 4.0–10.5)
nRBC: 0 % (ref 0.0–0.2)

## 2022-11-24 LAB — COMPREHENSIVE METABOLIC PANEL
ALT: 15 U/L (ref 0–44)
ALT: 17 U/L (ref 0–44)
AST: 22 U/L (ref 15–41)
AST: 34 U/L (ref 15–41)
Albumin: 1.9 g/dL — ABNORMAL LOW (ref 3.5–5.0)
Albumin: 1.8 g/dL — ABNORMAL LOW (ref 3.5–5.0)
Alkaline Phosphatase: 102 U/L (ref 38–126)
Alkaline Phosphatase: 95 U/L (ref 38–126)
Anion gap: 13 (ref 5–15)
Anion gap: 15 (ref 5–15)
BUN: 67 mg/dL — ABNORMAL HIGH (ref 8–23)
BUN: 60 mg/dL — ABNORMAL HIGH (ref 8–23)
CO2: 22 mmol/L (ref 22–32)
CO2: 18 mmol/L — ABNORMAL LOW (ref 22–32)
Calcium: 7.8 mg/dL — ABNORMAL LOW (ref 8.9–10.3)
Calcium: 7.8 mg/dL — ABNORMAL LOW (ref 8.9–10.3)
Chloride: 98 mmol/L (ref 98–111)
Chloride: 100 mmol/L (ref 98–111)
Creatinine, Ser: 3.94 mg/dL — ABNORMAL HIGH (ref 0.61–1.24)
Creatinine, Ser: 3.67 mg/dL — ABNORMAL HIGH (ref 0.61–1.24)
GFR, Estimated: 14 mL/min — ABNORMAL LOW (ref >60)
GFR, Estimated: 15 mL/min — ABNORMAL LOW (ref >60)
Glucose, Bld: 84 mg/dL (ref 70–99)
Glucose, Bld: 95 mg/dL (ref 70–99)
Potassium: 4.1 mmol/L (ref 3.5–5.1)
Potassium: 4.9 mmol/L (ref 3.5–5.1)
Sodium: 133 mmol/L — ABNORMAL LOW (ref 135–145)
Sodium: 133 mmol/L — ABNORMAL LOW (ref 135–145)
Total Bilirubin: 1.1 mg/dL (ref 0.3–1.2)
Total Bilirubin: 1.5 mg/dL — ABNORMAL HIGH (ref 0.3–1.2)
Total Protein: 5.5 g/dL — ABNORMAL LOW (ref 6.5–8.1)
Total Protein: 5 g/dL — ABNORMAL LOW (ref 6.5–8.1)

## 2022-11-24 LAB — BPAM RBC
Blood Product Expiration Date: 202408292359
Blood Product Expiration Date: 202408302359
Blood Product Expiration Date: 202408202359
Blood Product Expiration Date: 202408202359
ISSUE DATE / TIME: 202408071753
ISSUE DATE / TIME: 202408071958
Unit Type and Rh: 6200
Unit Type and Rh: 6200
Unit Type and Rh: 600
Unit Type and Rh: 6200

## 2022-11-24 LAB — TYPE AND SCREEN
ABO/RH(D): AB POS
ABO/RH(D): AB POS
Antibody Screen: POSITIVE
Antibody Screen: POSITIVE
DAT, IgG: NEGATIVE
Donor AG Type: NEGATIVE
Donor AG Type: NEGATIVE
Unit division: 0
Unit division: 0
Unit division: 0
Unit division: 0

## 2022-11-24 LAB — GLUCOSE, CAPILLARY
Glucose-Capillary: 92 mg/dL (ref 70–99)
Glucose-Capillary: 156 mg/dL — ABNORMAL HIGH (ref 70–99)

## 2022-11-24 LAB — APTT
aPTT: 58 seconds — ABNORMAL HIGH (ref 24–36)
aPTT: 47 seconds — ABNORMAL HIGH (ref 24–36)

## 2022-11-24 LAB — HEMOGLOBIN AND HEMATOCRIT, BLOOD
HCT: 14.5 % — ABNORMAL LOW (ref 39.0–52.0)
HCT: 26.5 % — ABNORMAL LOW (ref 39.0–52.0)
Hemoglobin: 4.5 g/dL — CL (ref 13.0–17.0)
Hemoglobin: 8.6 g/dL — ABNORMAL LOW (ref 13.0–17.0)

## 2022-11-24 LAB — PROTIME-INR
INR: 5.2 (ref 0.8–1.2)
INR: 3.7 — ABNORMAL HIGH (ref 0.8–1.2)
Prothrombin Time: 48 seconds — ABNORMAL HIGH (ref 11.4–15.2)
Prothrombin Time: 37.3 seconds — ABNORMAL HIGH (ref 11.4–15.2)

## 2022-11-24 LAB — LACTIC ACID, PLASMA
Lactic Acid, Venous: 2.8 mmol/L (ref 0.5–1.9)
Lactic Acid, Venous: 8.6 mmol/L (ref 0.5–1.9)
Lactic Acid, Venous: 3.2 mmol/L (ref 0.5–1.9)

## 2022-11-24 LAB — MRSA NEXT GEN BY PCR, NASAL: MRSA by PCR Next Gen: NOT DETECTED

## 2022-11-24 LAB — POC OCCULT BLOOD, ED: Fecal Occult Blood: POSITIVE — AB

## 2022-11-24 LAB — RESP PANEL BY RT-PCR (RSV, FLU A&B, COVID)  RVPGX2
Influenza A by PCR: NEGATIVE
Influenza B by PCR: NEGATIVE
Resp Syncytial Virus by PCR: NEGATIVE
SARS Coronavirus 2 by RT PCR: NEGATIVE

## 2022-11-24 LAB — PHOSPHORUS: Phosphorus: 4.5 mg/dL (ref 2.5–4.6)

## 2022-11-24 LAB — PREPARE RBC (CROSSMATCH)

## 2022-11-24 LAB — PROCALCITONIN: Procalcitonin: 54.02 ng/mL

## 2022-11-24 MED ORDER — SODIUM CHLORIDE 0.9 % IV SOLN: 2.0000 g | INTRAVENOUS | Status: DC

## 2022-11-24 MED ORDER — LACTATED RINGERS IV SOLN: INTRAVENOUS | Status: AC

## 2022-11-24 MED ORDER — PANTOPRAZOLE SODIUM 40 MG IV SOLR
40.0000 mg | Freq: Two times a day (BID) | INTRAVENOUS | Status: DC
  Administered 2022-11-24 – 2022-11-30 (×12): 40 mg via INTRAVENOUS
  Filled 2022-11-24 (×12): qty 10

## 2022-11-24 MED ORDER — VANCOMYCIN HCL IN DEXTROSE 1-5 GM/200ML-% IV SOLN: 1000.0000 mg | Freq: Once | INTRAVENOUS | Status: DC

## 2022-11-24 MED ORDER — VANCOMYCIN HCL 1500 MG/300ML IV SOLN
1500.0000 mg | Freq: Once | INTRAVENOUS | Status: AC
  Administered 2022-11-24: 1500 mg via INTRAVENOUS
  Filled 2022-11-24: qty 300

## 2022-11-24 MED ORDER — CHLORHEXIDINE GLUCONATE CLOTH 2 % EX PADS
6.0000 | MEDICATED_PAD | Freq: Every day | CUTANEOUS | Status: DC
  Administered 2022-11-24 – 2022-11-30 (×5): 6 via TOPICAL

## 2022-11-24 MED ORDER — SODIUM CHLORIDE 0.9 % IV SOLN
250.0000 mL | INTRAVENOUS | Status: DC
  Administered 2022-11-24: 250 mL via INTRAVENOUS

## 2022-11-24 MED ORDER — ORAL CARE MOUTH RINSE: 15.0000 mL | OROMUCOSAL | Status: DC | PRN

## 2022-11-24 MED ORDER — DOCUSATE SODIUM 100 MG PO CAPS: 100.0000 mg | ORAL_CAPSULE | Freq: Two times a day (BID) | ORAL | Status: DC | PRN

## 2022-11-24 MED ORDER — NOREPINEPHRINE 4 MG/250ML-% IV SOLN
2.0000 ug/min | INTRAVENOUS | Status: DC
  Administered 2022-11-24: 8 ug/min via INTRAVENOUS
  Administered 2022-11-25: 5 ug/min via INTRAVENOUS
  Filled 2022-11-24: qty 250

## 2022-11-24 MED ORDER — POLYETHYLENE GLYCOL 3350 17 G PO PACK: 17.0000 g | PACK | Freq: Every day | ORAL | Status: DC | PRN

## 2022-11-24 MED ORDER — LACTATED RINGERS IV BOLUS (SEPSIS)
1000.0000 mL | Freq: Once | INTRAVENOUS | Status: AC
  Administered 2022-11-24: 1000 mL via INTRAVENOUS

## 2022-11-24 MED ORDER — SODIUM CHLORIDE 0.9% IV SOLUTION: Freq: Once | INTRAVENOUS | Status: DC

## 2022-11-24 MED ORDER — VANCOMYCIN VARIABLE DOSE PER UNSTABLE RENAL FUNCTION (PHARMACIST DOSING): Status: DC

## 2022-11-24 MED ORDER — NOREPINEPHRINE 4 MG/250ML-% IV SOLN: 0.0000 ug/min | INTRAVENOUS | Status: DC

## 2022-11-24 MED ORDER — NOREPINEPHRINE 4 MG/250ML-% IV SOLN
INTRAVENOUS | Status: AC
  Administered 2022-11-24: 2 ug/min via INTRAVENOUS
  Filled 2022-11-24: qty 250

## 2022-11-24 MED ORDER — ACETAMINOPHEN 650 MG RE SUPP
RECTAL | Status: AC
  Administered 2022-11-24: 650 mg
  Filled 2022-11-24: qty 1

## 2022-11-24 MED ORDER — METRONIDAZOLE 500 MG/100ML IV SOLN
500.0000 mg | Freq: Once | INTRAVENOUS | Status: AC
  Administered 2022-11-24: 500 mg via INTRAVENOUS
  Filled 2022-11-24: qty 100

## 2022-11-24 MED ORDER — SODIUM CHLORIDE 0.9 % IV SOLN
2.0000 g | Freq: Once | INTRAVENOUS | Status: AC
  Administered 2022-11-24: 2 g via INTRAVENOUS
  Filled 2022-11-24: qty 12.5

## 2022-11-24 MED ORDER — METRONIDAZOLE 500 MG/100ML IV SOLN
500.0000 mg | Freq: Two times a day (BID) | INTRAVENOUS | Status: DC
  Administered 2022-11-25: 500 mg via INTRAVENOUS
  Filled 2022-11-24: qty 100

## 2022-11-24 MED ORDER — VITAMIN K1 10 MG/ML IJ SOLN
5.0000 mg | Freq: Once | INTRAVENOUS | Status: AC
  Administered 2022-11-24: 5 mg via INTRAVENOUS
  Filled 2022-11-24: qty 0.5

## 2022-11-24 NOTE — Progress Notes (Signed)
Pharmacy Antibiotic Note  Jesse DUBS Sr. is a 89 y.o. male admitted on 11/24/2022 with possible sepsis.  Pharmacy has been consulted for vancomycin and cefepime dosing.  Patient noted to have wbc of 39 and aki with scr of 3.9   Plan: Will load with vancomycin 1500mg  x 1 now and follow up renal function daily and consider random level prior to redosing.  Cefepime 2g q24 hours   Height: 5\' 7"  (170.2 cm) Weight: 85.3 kg (188 lb) IBW/kg (Calculated) : 66.1  No data recorded.  No results for input(s): "WBC", "CREATININE", "LATICACIDVEN", "VANCOTROUGH", "VANCOPEAK", "VANCORANDOM", "GENTTROUGH", "GENTPEAK", "GENTRANDOM", "TOBRATROUGH", "TOBRAPEAK", "TOBRARND", "AMIKACINPEAK", "AMIKACINTROU", "AMIKACIN" in the last 168 hours.  CrCl cannot be calculated (Patient's most recent lab result is older than the maximum 21 days allowed.).    Allergies  Allergen Reactions   Codeine Swelling   Thank you for allowing pharmacy to be a part of this patient's care.  Sheppard Coil PharmD., BCPS Clinical Pharmacist 11/24/2022 4:17 PM

## 2022-11-24 NOTE — H&P (Signed)
NAME:  [name]., MRN:  [id number], DOB:  Aug 31, 1928, LOS: 0 ADMISSION DATE:  11/24/2022 CONSULTATION DATE:  11/24/2022 REFERRING MD:  Deretha Emory - EDP ([facility]) CHIEF COMPLAINT:  AMS, suspected urosepsis   History of Present Illness:  89 year old man who presented to [facility] ED 8/7 from SNF [facility]) for AMS. PMHx significant for HTN, PAF (on Eliquis), PAD (c/b critical limb ischemia, s/p R foot fifth ray amputation 08/2022), chronic indwelling urinary catheter with history of UTI, BPH, prostate CA. Recent admission 5/7 - 5/24 for critical limb ischemia of RLE/osteomyelitis requiring ray amputation and WV; he was discharged to inpatient rehab (5/24 - 6/7) then to SNF. General functional decline since the beginning of 2024.  Patient is altered, therefore history is primarily obtained from family and from chart review. Family reports that patient had been alert and at his baseline 8/7AM but became altered after lunch. Noted that patient has been somewhat unwell since 8/3 with several episodes of nausea with vomiting. On ED arrival, patient was afebrile, HR 84, hypotensive with BP 80/52, RR 20, SpO2 98%. Labs were notable for WBC 39, Hgb 6.0 (baseline 8-10), Plt 398. INR 5.2. Na 133, K 4.1, CO2 22, BUN/Cr 67/3.94, albumin 1.9; LFTs WNL. LA 2.8 > 8.6. UA turbid with moderate Hgb, >300 protein, large leuks, nitrite negative. UCx and BCx pending and broad-spectrum antibiotics started. COVID/Flu/RSV negative. 2U PRBCs ordered for repeat Hgb 4.5 (6.0). CXR unremarkable.  PCCM consulted for ICU admission and further management, concern for urosepsis +/- GIB.  Pertinent Medical History:   Past Medical History:  Diagnosis Date   Chronic indwelling Foley catheter    Followed by Urology   Hypertension    Kidney calculus 2014;2013   PAD (peripheral artery disease) (HCC)    S/p R foot fifth ray amputation 08/2022 ([facility])   PAF (paroxysmal atrial fibrillation) (HCC)    On Eliquis   Pneumonia    Prostate  cancer (HCC)    Shortness of breath    Significant Hospital Events: Including procedures, antibiotic start and stop dates in addition to other pertinent events   8/7 - Presented to [facility] ED altered, hypotensive, c/f sepsis. Urologic source suspected with turbid/frank pus Foley output. Hgb also noted to be 4.5 (6.0), 2U PRBCs given. Transferred to [facility] for further care.  Interim History / Subjective:  PCCM consulted for ICU transfer/management.  Objective:  Blood pressure 119/67, pulse 84, temperature 98 F (36.7 C), resp. rate (!) 22, height 5\' 7"  (1.702 m), weight 85.3 kg, SpO2 99%.        Intake/Output Summary (Last 24 hours) at 11/24/2022 2140 Last data filed at 11/24/2022 1945 Gross per 24 hour  Intake 327.85 ml  Output --  Net 327.85 ml   Filed Weights   11/24/22 1558  Weight: 85.3 kg   Physical Examination: General: Acute-on-chronically ill-appearing elderly man in NAD. Pleasant and conversant. HEENT: [place]/AT, anicteric sclera, PERRL, dry mucous membranes. Neuro: Awake, oriented x 4. Responds to verbal stimuli. Following commands consistently. Moves all 4 extremities spontaneously. Generalized weakness.  CV: Irregularly irregular rhythm, rate 70s, no m/g/r. PULM: Breathing even and unlabored on RA. Lung fields CTAB. GI: Soft, nontender, nondistended. Hypoactive bowel sounds. Extremities: Bilateral symmetric 2+ pitting LE edema noted. L great toe with ulceration present. R foot s/p fifth ray amputation. Skin: Warm/dry, no rashes.    Resolved Hospital Problem List:    Assessment & Plan:  Undifferentiated shock, presumed septic (urologic source) versus hypovolemic/hemorrhagic Lactic acidosis Febrile and hypotensive  on [facility] ED arrival; Hgb at [facility] 4.5. - Admit to [facility] ICU for further care - Goal MAP > 65 - Fluid resuscitation as tolerated - Levophed titrated to goal MAP, may require CVC if pressor needs continue to uptrend - Trend WBC/Hgb, fever curve, LA - F/u Cx data -  Continue broad-spectrum antibiotics (cefepime/vanc, Flagyl)  Anemia in the setting of gross hematuria, less likely GIB Elevated INR 2U PRBCs transfused at [facility]/en route to [facility] for Hgb 4.5. INR 5.4, on Eliquis at home. Synthetic liver function appears normal. Gross hematuria noted in Foley bag on arrival to [facility]. FOBT+ but DRE negative for blood. - Trend H&H, repeat post-transfusion - Monitor for signs of active bleeding - Transfuse for Hgb < 7.0 or hemodynamically significant bleeding - PPI BID for now - Vitamin K 5mg  IV x 1 - NPO in the event EGD is needed, low threshold GI consult if Hgb continues to drop  Chronic indwelling Foley catheter History of UTI Concern for urosepsis, +pyuria Prostate CA Follows as outpatient with Urology. Per last office visit (Dr. Retta Diones), patient has progressive prostate CA with negative bone imaging 2023, remains relatively asymptomatic. No longer requiring Flomax. - F/u UA/Cx - Broad-spectrum antibiotics as above - Continue indwelling Foley catheter, change Q 4 weeks (16Fr Foley) - Flush Q2H with 10cc NS - Low threshold Uro consult for gross hematuria, ?CBI  Paroxysmal Afib LE edema Followed as outpatient by Dr. Rosemary Holms, last seen 7/22. CHA2DS2-VASc Score 5. - Cardiac monitoring - Optimize electrolytes for K > 4, Mg > 2 - Hold AC at present given Hgb 4.5 and ?GIB - Consider heparin gtt when clinically appropriate to resume AC until stable, then resume home Eliquis - F/u Echo - Hold Lasix for now, given hypotension  PAD Critical limb ischemia of RLE requiring fifth partial ray amputation History of osteomyelitis Recent admission 5/7 - 5/24 for critical limb ischemia of RLE/osteomyelitis requiring ray amputation and WV. Underwent abdominal aortogram with runoff 5/16 with [facility] Karin Lieu) with recanalization and ballon angioplasty of tibioperoneal trunk. Ultimately required R fifth partial ray amputation 5/22 with Ortho Lajoyce Corners). Admission c/b  osteomyelitis and bacteremia (Aerococcus/Actinotignum schaalii).  - Seen by ID during previous admission, low threshold for reconsult - Pulse checks per protocol, doppler as needed - ASA/statin, hold ASA for now in the setting of ?GIB  Physical deconditioning At risk for malnutrition Discharged to inpatient rehab (5/24 - 6/7) then to SNF. General functional decline since the beginning of 2024. - PMT consult previously recommended - Will engage this admission if patient/family open to this - RD/Nutrition, PT/OT/SLP consults  Best Practice: (right click and "Reselect all SmartList Selections" daily)   Diet/type: NPO DVT prophylaxis: SCDs, hold Eliquis GI prophylaxis: PPI Lines: N/A Foley:  Yes, and it is still needed (chronic) Code Status:  full code Last date of multidisciplinary goals of care discussion [Pending]  Labs:  CBC: Recent Labs  Lab 11/24/22 1553 11/24/22 1801  WBC 39.0*  --   NEUTROABS 35.5*  --   HGB 6.0* 4.5*  HCT 18.6* 14.5*  MCV 108.1*  --   PLT 398  --    Basic Metabolic Panel: Recent Labs  Lab 11/24/22 1553  NA 133*  K 4.1  CL 98  CO2 22  GLUCOSE 84  BUN 67*  CREATININE 3.94*  CALCIUM 7.8*   GFR: Estimated Creatinine Clearance: 12.5 mL/min (A) (by C-G formula based on SCr of 3.94 mg/dL (H)). Recent Labs  Lab 11/24/22 1553 11/24/22 1801  WBC  39.0*  --   LATICACIDVEN 2.8* 8.6*   Liver Function Tests: Recent Labs  Lab 11/24/22 1553  AST 22  ALT 15  ALKPHOS 102  BILITOT 1.1  PROT 5.5*  ALBUMIN 1.9*   No results for input(s): "LIPASE", "AMYLASE" in the last 168 hours. No results for input(s): "AMMONIA" in the last 168 hours.  ABG: No results found for: "PHART", "PCO2ART", "PO2ART", "HCO3", "TCO2", "ACIDBASEDEF", "O2SAT"   Coagulation Profile: Recent Labs  Lab 11/24/22 1553  INR 5.2*   Cardiac Enzymes: No results for input(s): "CKTOTAL", "CKMB", "CKMBINDEX", "TROPONINI" in the last 168 hours.  HbA1C: No results found for:  "HGBA1C"  CBG: No results for input(s): "GLUCAP" in the last 168 hours.  Review of Systems:   Review of systems completed with pertinent positives/negatives outlined in above HPI.  Past Medical History:  He,  has a past medical history of Chronic indwelling Foley catheter, Hypertension, Kidney calculus (4403;4742), PAD (peripheral artery disease) (HCC), PAF (paroxysmal atrial fibrillation) (HCC), Pneumonia, Prostate cancer (HCC), and Shortness of breath.   Surgical History:   Past Surgical History:  Procedure Laterality Date   ABDOMINAL AORTOGRAM W/LOWER EXTREMITY N/A 09/02/2022   Procedure: ABDOMINAL AORTOGRAM W/LOWER EXTREMITY;  Surgeon: Victorino Sparrow, MD;  Location: [facility] INVASIVE CV LAB;  Service: Vascular;  Laterality: N/A;   AMPUTATION Right 09/08/2022   Procedure: RIGHT FOOT FIFTH RAY AMPUTATION;  Surgeon: [name], MD;  Location: [facility] OR;  Service: Orthopedics;  Laterality: Right;   CATARACT EXTRACTION W/PHACO Right 02/13/2013   Procedure: CATARACT EXTRACTION RIGHT EYE (WITH PHACO) AND INTRAOCULAR LENS PLACEMENT  CDE=14.53;  Surgeon: Loraine Leriche T. Nile Riggs, MD;  Location: [facility] ORS;  Service: Ophthalmology;  Laterality: Right;   CATARACT EXTRACTION W/PHACO Left 02/27/2013   Procedure: CATARACT EXTRACTION PHACO AND INTRAOCULAR LENS PLACEMENT (IOC);  Surgeon: Loraine Leriche T. Nile Riggs, MD;  Location: [facility] ORS;  Service: Ophthalmology;  Laterality: Left;  CDE:12.54   COLONOSCOPY     JOINT REPLACEMENT Right 1993;1980   Right x2    Social History:   reports that he has never smoked. He has never used smokeless tobacco. He reports that he does not drink alcohol and does not use drugs.   Family History:  His family history is not on file.   Allergies: Allergies  Allergen Reactions   Codeine Swelling    Home Medications: Prior to Admission medications   Medication Sig Start Date End Date Taking? Authorizing Provider  acetaminophen (TYLENOL) 500 MG tablet Take 1 tablet (500 mg total) by mouth 2  (two) times daily with breakfast and lunch. 09/23/22  Yes Love, Evlyn Kanner, PA-C  alum & mag hydroxide-simeth (MAALOX/MYLANTA) 200-200-20 MG/5ML suspension Take 15-30 mLs by mouth every 2 (two) hours as needed for indigestion. Patient taking differently: Take 30 mLs by mouth every 2 (two) hours as needed for indigestion. 09/10/22  Yes Esaw Grandchild A, DO  Amino Acids-Protein Hydrolys (PRO-STAT PROFILE PO) Take 30 mLs by mouth 3 (three) times daily.   Yes [provider]  apixaban (ELIQUIS) 5 MG TABS tablet Take 1 tablet (5 mg total) by mouth 2 (two) times daily. 09/10/22  Yes Esaw Grandchild A, DO  ascorbic acid (VITAMIN C) 1000 MG tablet Take 1 tablet (1,000 mg total) by mouth daily. 09/11/22  Yes Esaw Grandchild A, DO  atorvastatin (LIPITOR) 40 MG tablet Take 1 tablet (40 mg total) by mouth daily. 09/11/22  Yes Esaw Grandchild A, DO  furosemide (LASIX) 20 MG tablet Take 1 tablet (20 mg total) by  mouth daily. 09/23/22  Yes Love, Evlyn Kanner, PA-C  guaiFENesin-dextromethorphan (ROBITUSSIN DM) 100-10 MG/5ML syrup Take 15 mLs by mouth every 4 (four) hours as needed for cough.   Yes [provider]  lactose free nutrition (BOOST) LIQD Take 237 mLs by mouth 2 (two) times daily.   Yes [provider]  Multiple Vitamin (MULTIVITAMIN WITH MINERALS) TABS tablet Take 1 tablet by mouth daily. 09/11/22  Yes Esaw Grandchild A, DO  omega-3 acid ethyl esters (LOVAZA) 1 g capsule Take 1 capsule (1 g total) by mouth daily. 09/11/22  Yes Esaw Grandchild A, DO  ondansetron (ZOFRAN) 4 MG tablet Take 4 mg by mouth every 6 (six) hours as needed for nausea or vomiting.   Yes [provider]  pantoprazole (PROTONIX) 40 MG tablet Take 1 tablet (40 mg total) by mouth 2 (two) times daily. 09/23/22  Yes Love, Evlyn Kanner, PA-C  polyethylene glycol (MIRALAX / GLYCOLAX) 17 g packet Take 17 g by mouth daily. Hold for loose BM.   Yes [provider]  senna-docusate (SENOKOT-S) 8.6-50 MG tablet Take 1  tablet by mouth 2 (two) times daily. 09/10/22  Yes Esaw Grandchild A, DO  tamsulosin (FLOMAX) 0.4 MG CAPS capsule Take 0.4 mg by mouth daily after supper.   Yes [provider]    Critical care time:   The patient is critically ill with multiple organ system failure and requires high complexity decision making for assessment and support, frequent evaluation and titration of therapies, advanced monitoring, review of radiographic studies and interpretation of complex data.   Critical Care Time devoted to patient care services, exclusive of separately billable procedures, described in this note is 42 minutes.  Tim Lair, PA-C [facility] 11/24/22 9:40 PM  Please see [email or web address] for pager details.  From 7A-7P if no response, please call [phone] After hours, please call ELink [phone]

## 2022-11-24 NOTE — ED Notes (Signed)
[facility] arrived for patient.  Green blood transfusion paper sent with [facility].  2nd unit of blood transfusing on transport

## 2022-11-24 NOTE — ED Notes (Signed)
Sbp below parameters of 90. Levo increased

## 2022-11-24 NOTE — Plan of Care (Signed)

## 2022-11-24 NOTE — ED Notes (Signed)
Report given to [facility] 

## 2022-11-24 NOTE — ED Triage Notes (Signed)
Pt brought in by [facility] for AMS per [facility]. Pt was "alert this morning and talking" after lunch became altered. Pt has a chronic catheter and hx of UTI.

## 2022-11-24 NOTE — ED Notes (Signed)
SBP below parameters of 90. Levo increased.

## 2022-11-24 NOTE — Sepsis Progress Note (Signed)
Notified provider of need to order repeat lactic acid.  Order placed

## 2022-11-24 NOTE — Progress Notes (Signed)
Pt has USGPIV in place and is currently being used for vasopressor with ivWatch in place. Per unit RN no further access is needed at this time.

## 2022-11-24 NOTE — ED Provider Notes (Signed)
[place] EMERGENCY DEPARTMENT AT [facility] Provider Note   CSN: [id number] Arrival date & time: 11/24/22  1514     History  Chief Complaint  Patient presents with   Altered Mental Status    [name] Sr. is a 89 y.o. male.  Patient brought in from [facility] for altered mental status patient apparently was alert this morning and talking.  Family though states that he has been kind of sick since Saturday that he had several episodes of vomiting.  Patient has chronic Foley catheter in place and history of UTIs.  Patient upon arrival here febrile hypotensive.  He is awake but is little drowsy.  Past medical history is given for hypertension shortness of breath history of pneumonia prostate cancer.  And patient recently admitted for weakness in May 2024.  Does have blackening of his toes.  Patient is on Eliquis.  Patient in May 2024 did have an abdominal aortogram with lower extremity runoff done.  This was done by Dr. Sherral Hammers.  Vascular service.  Patient is a former smoker.  Patient also had an amputation in Sep 08, 2018 for right foot fifth ray amputation.  This was done by Dr. Lajoyce Corners.  Patient with obviously pus coming out of his Foley catheter.       Home Medications Prior to Admission medications   Medication Sig Start Date End Date Taking? Authorizing Provider  acetaminophen (TYLENOL) 500 MG tablet Take 1 tablet (500 mg total) by mouth 2 (two) times daily with breakfast and lunch. 09/23/22  Yes Love, Evlyn Kanner, PA-C  alum & mag hydroxide-simeth (MAALOX/MYLANTA) 200-200-20 MG/5ML suspension Take 15-30 mLs by mouth every 2 (two) hours as needed for indigestion. Patient taking differently: Take 30 mLs by mouth every 2 (two) hours as needed for indigestion. 09/10/22  Yes Esaw Grandchild A, DO  Amino Acids-Protein Hydrolys (PRO-STAT PROFILE PO) Take 30 mLs by mouth 3 (three) times daily.   Yes [provider]  apixaban (ELIQUIS) 5 MG TABS tablet Take 1 tablet (5  mg total) by mouth 2 (two) times daily. 09/10/22  Yes Esaw Grandchild A, DO  ascorbic acid (VITAMIN C) 1000 MG tablet Take 1 tablet (1,000 mg total) by mouth daily. 09/11/22  Yes Esaw Grandchild A, DO  atorvastatin (LIPITOR) 40 MG tablet Take 1 tablet (40 mg total) by mouth daily. 09/11/22  Yes Esaw Grandchild A, DO  furosemide (LASIX) 20 MG tablet Take 1 tablet (20 mg total) by mouth daily. 09/23/22  Yes Love, Evlyn Kanner, PA-C  guaiFENesin-dextromethorphan (ROBITUSSIN DM) 100-10 MG/5ML syrup Take 15 mLs by mouth every 4 (four) hours as needed for cough.   Yes [provider]  lactose free nutrition (BOOST) LIQD Take 237 mLs by mouth 2 (two) times daily.   Yes [provider]  Multiple Vitamin (MULTIVITAMIN WITH MINERALS) TABS tablet Take 1 tablet by mouth daily. 09/11/22  Yes Esaw Grandchild A, DO  omega-3 acid ethyl esters (LOVAZA) 1 g capsule Take 1 capsule (1 g total) by mouth daily. 09/11/22  Yes Esaw Grandchild A, DO  ondansetron (ZOFRAN) 4 MG tablet Take 4 mg by mouth every 6 (six) hours as needed for nausea or vomiting.   Yes [provider]  pantoprazole (PROTONIX) 40 MG tablet Take 1 tablet (40 mg total) by mouth 2 (two) times daily. 09/23/22  Yes Love, Evlyn Kanner, PA-C  polyethylene glycol (MIRALAX / GLYCOLAX) 17 g packet Take 17 g by mouth daily. Hold for loose BM.   Yes  [provider]  senna-docusate (SENOKOT-S) 8.6-50 MG tablet Take 1 tablet by mouth 2 (two) times daily. 09/10/22  Yes Esaw Grandchild A, DO  tamsulosin (FLOMAX) 0.4 MG CAPS capsule Take 0.4 mg by mouth daily after supper.   Yes [provider]      Allergies    Codeine    Review of Systems   Review of Systems  Unable to perform ROS: Mental status change    Physical Exam Updated Vital Signs BP (!) 80/52   Pulse 84   Temp 98.2 F (36.8 C) (Oral)   Resp 20   Ht 1.702 m (5\' 7" )   Wt 85.3 kg   SpO2 98%   BMI 29.44 kg/m  Physical Exam Vitals and nursing note reviewed.   Constitutional:      General: He is not in acute distress.    Appearance: Normal appearance. He is well-developed.  HENT:     Head: Normocephalic and atraumatic.  Eyes:     Extraocular Movements: Extraocular movements intact.     Conjunctiva/sclera: Conjunctivae normal.     Pupils: Pupils are equal, round, and reactive to light.  Cardiovascular:     Rate and Rhythm: Normal rate and regular rhythm.     Heart sounds: No murmur heard. Pulmonary:     Effort: Pulmonary effort is normal. No respiratory distress.     Breath sounds: Normal breath sounds.  Abdominal:     Palpations: Abdomen is soft.     Tenderness: There is no abdominal tenderness.  Genitourinary:    Rectum: Guaiac result positive.     Comments: Gross blood mixed with stool per rectum.  Not melanotic or maroon.  No active rectal bleeding.  Foley catheter with purulent discharge. Musculoskeletal:        General: No swelling.     Cervical back: Neck supple.  Skin:    General: Skin is warm and dry.     Capillary Refill: Capillary refill takes less than 2 seconds.  Neurological:     General: No focal deficit present.     Mental Status: He is alert.  Psychiatric:        Mood and Affect: Mood normal.     ED Results / Procedures / Treatments   Labs (all labs ordered are listed, but only abnormal results are displayed) Labs Reviewed  LACTIC ACID, PLASMA - Abnormal; Notable for the following components:      Result Value   Lactic Acid, Venous 2.8 (*)    All other components within normal limits  COMPREHENSIVE METABOLIC PANEL - Abnormal; Notable for the following components:   Sodium 133 (*)    BUN 67 (*)    Creatinine, Ser 3.94 (*)    Calcium 7.8 (*)    Total Protein 5.5 (*)    Albumin 1.9 (*)    GFR, Estimated 14 (*)    All other components within normal limits  CBC WITH DIFFERENTIAL/PLATELET - Abnormal; Notable for the following components:   WBC 39.0 (*)    RBC 1.72 (*)    Hemoglobin 6.0 (*)    HCT 18.6 (*)     MCV 108.1 (*)    MCH 34.9 (*)    RDW 18.6 (*)    Neutro Abs 35.5 (*)    Monocytes Absolute 1.2 (*)    All other components within normal limits  PROTIME-INR - Abnormal; Notable for the following components:   Prothrombin Time 48.0 (*)    INR 5.2 (*)    All  other components within normal limits  APTT - Abnormal; Notable for the following components:   aPTT 58 (*)    All other components within normal limits  URINALYSIS, W/ REFLEX TO CULTURE (INFECTION SUSPECTED) - Abnormal; Notable for the following components:   Color, Urine AMBER (*)    APPearance TURBID (*)    Hgb urine dipstick MODERATE (*)    Protein, ur >=300 (*)    Leukocytes,Ua LARGE (*)    Bacteria, UA FEW (*)    All other components within normal limits  HEMOGLOBIN AND HEMATOCRIT, BLOOD - Abnormal; Notable for the following components:   Hemoglobin 4.5 (*)    HCT 14.5 (*)    All other components within normal limits  POC OCCULT BLOOD, ED - Abnormal; Notable for the following components:   Fecal Occult Blood Positive (*)    All other components within normal limits  RESP PANEL BY RT-PCR (RSV, FLU A&B, COVID)  RVPGX2  CULTURE, BLOOD (ROUTINE X 2)  CULTURE, BLOOD (ROUTINE X 2)  URINE CULTURE  LACTIC ACID, PLASMA  TYPE AND SCREEN  PREPARE RBC (CROSSMATCH)    EKG EKG Interpretation Date/Time:  Wednesday November 24 2022 16:42:38 EDT Ventricular Rate:  87 PR Interval:    QRS Duration:  85 QT Interval:  351 QTC Calculation: 423 R Axis:   -6  Text Interpretation: Atrial fibrillation Ventricular premature complex Posterior infarct, old No significant change since last tracing Confirmed by Vanetta Mulders [phone]) on 11/24/2022 4:56:44 PM  Radiology DG Chest Port 1 View  Result Date: 11/24/2022 CLINICAL DATA:  Sepsis EXAM: PORTABLE CHEST 1 VIEW COMPARISON:  08/24/2022 x-ray and CT angiogram FINDINGS: The heart size and mediastinal contours are within normal limits. Film is rotated to the right. Calcified aorta. No  consolidation, pneumothorax or effusion. No edema. Overlapping cardiac leads. The visualized skeletal structures are unremarkable. IMPRESSION: Rotated radiograph.  No acute cardiopulmonary disease. Electronically Signed   By: Karen Kays M.D.   On: 11/24/2022 16:27    Procedures Procedures    Medications Ordered in ED Medications  lactated ringers infusion ( Intravenous New Bag/Given 11/24/22 1727)  0.9 %  sodium chloride infusion (Manually program via Guardrails IV Fluids) (has no administration in time range)  ceFEPIme (MAXIPIME) 2 g in sodium chloride 0.9 % 100 mL IVPB (has no administration in time range)  vancomycin variable dose per unstable renal function (pharmacist dosing) (has no administration in time range)  0.9 %  sodium chloride infusion (Manually program via Guardrails IV Fluids) (has no administration in time range)  norepinephrine (LEVOPHED) 4mg  in (0.016 mg/mL) premix infusion (2 mcg/min Intravenous New Bag/Given 11/24/22 1845)  lactated ringers bolus 1,000 mL (0 mLs Intravenous Stopped 11/24/22 1815)    And  lactated ringers bolus 1,000 mL (0 mLs Intravenous Stopped 11/24/22 1815)    And  lactated ringers bolus 1,000 mL (0 mLs Intravenous Stopped 11/24/22 1815)  ceFEPIme (MAXIPIME) 2 g in sodium chloride 0.9 % 100 mL IVPB (0 g Intravenous Stopped 11/24/22 1726)  metroNIDAZOLE (FLAGYL) IVPB 500 mg (0 mg Intravenous Stopped 11/24/22 1815)  vancomycin (VANCOREADY) IVPB 1500 mg/300 mL (0 mg Intravenous Stopped 11/24/22 1645)  acetaminophen (TYLENOL) 650 MG suppository (650 mg  Given 11/24/22 1714)    ED Course/ Medical Decision Making/ A&P                                 Medical Decision Making Amount and/or Complexity  of Data Reviewed Labs: ordered. Radiology: ordered. ECG/medicine tests: ordered.  Risk Prescription drug management. Decision regarding hospitalization.   CRITICAL CARE Performed by: Vanetta Mulders Total critical care time: 60 minutes Critical care time  was exclusive of separately billable procedures and treating other patients. Critical care was necessary to treat or prevent imminent or life-threatening deterioration. Critical care was time spent personally by me on the following activities: development of treatment plan with patient and/or surrogate as well as nursing, discussions with consultants, evaluation of patient's response to treatment, examination of patient, obtaining history from patient or surrogate, ordering and performing treatments and interventions, ordering and review of laboratory studies, ordering and review of radiographic studies, pulse oximetry and re-evaluation of patient's condition.  On arrival patient was febrile and hypotensive.  Show critical care protocol for sepsis initiated.  Patient treated with broad-spectrum antibiotics for unknown source.  Although urine appeared to be a possible source.  Also evidence of GI bleed.  Patient is initial hemoglobin came in at 6.  He received 3 L of fluid per the 30 mg/kg protocol.  Pressures did come up into the 90s briefly but then dropped down again.  Then we had his hemoglobin back we did rectal exam heme positive without active blood coming out.  Repeat hemoglobin was 4.5 this could be somewhat delusional but still quite low.  Patient's blood transfusion 2 packed red blood cells was delayed because of antibodies.  So he is receiving O- blood is received the first unit.  The ICU specialist recommended just holding for the second 1 and lessers going to be a delay with getting him there.  And just having him transport with that second unit of blood.  Cussed with the intensivist.  They will accept him for admission to ICU at [facility].  Patient's pressures remained low so started on Levophed to temporize.  Chest x-ray without any acute findings.  Patient's lactic acid was in the 2 range.  Patient's COVID testing was negative.  Patient's white blood cell count was markedly elevated at 39.   Platelets were normal.  INR 5.2 patient is on Eliquis.  But again do not think that we need to reverse him at this point in time because there is no evidence of persistent active bleeding.  Final Clinical Impression(s) / ED Diagnoses Final diagnoses:  Sepsis, due to unspecified organism, unspecified whether acute organ dysfunction present [facility])  Gastrointestinal hemorrhage, unspecified gastrointestinal hemorrhage type    Rx / DC Orders ED Discharge Orders     None         Vanetta Mulders, MD 11/24/22 1910

## 2022-11-24 NOTE — Progress Notes (Signed)
Elink is following sepsis bundle. 

## 2022-11-24 NOTE — Progress Notes (Signed)
eLink Physician-Brief Progress Note Patient Name: [name]. DOB: [birth year] MRN: [id number]   Date of Service  11/24/2022  HPI/Events of Note  89 year old man with a history of essential hypertension, paroxysmal atrial fibrillation chronic and anticoagulation, peripheral arterial disease with history of critical limb ischemia and chronic indwelling Foley catheter in the setting of previous prostate cancer and BPH who presented with acute encephalopathy found to have shock thought to be secondary to urinary source and gross hematuria with blood loss anemia  He was hypotensive requiring low-dose norepinephrine, broad-spectrum antibiotics, and blood transfusion.  Metabolic panel consistent with elevated creatinine, none anion gap metabolic acidosis, urinalysis is grossly positive.  INR remains elevated although this has questionable meaning in the setting of DOAC use.  He has received a total of 4 units PRBC.  Last hemoglobin 4.5 prior to transfusion.  Status post vitamin K injection, has not received adexanet or PCC.  eICU Interventions  If the patient is incomplete response to transfusion, may need to do [facility].  If additional transfusions required, could consider 1: 1: 1 transfusion.  Continue broad-spectrum antibiotics  Continue PPI twice daily  Peripheral norepinephrine to maintain MAP and crystalloid infusion.  Add-on hemoglobin check every 6 hours  May benefit from arterial line  SCDs for DVT prophylaxis in setting bleeding   0342 -lactic acid still elevated at 3.3.  Will repeat with future labs.  8657 - Nausea -> zofran  Intervention Category Evaluation Type: New Patient Evaluation    11/24/2022, 11:09 PM

## 2022-11-24 NOTE — Progress Notes (Signed)
Patients foley with blood and visible clots. Foley flushed with two 10cc syringes, clots passed but unable to drain from foley bag. Seal broken and foley bag replaced. Critical care PA and MD at bedside. Q2hr 10cc saline flushed ordered.

## 2022-11-24 NOTE — ED Notes (Addendum)
Resumed care of patient at this time. Levo rate increased to 72mcg/min due to sbp below parameters of 90. MD in room.

## 2022-11-24 NOTE — ED Notes (Signed)
Second unit of emergency release blood started. Patient is awake and talking to nurse and trying to remove IV,  Explained to patient that we can't remove IV at this time. Patient asking where he is at.

## 2022-11-25 DIAGNOSIS — R6521 Severe sepsis with septic shock: Secondary | ICD-10-CM | POA: Diagnosis not present

## 2022-11-25 DIAGNOSIS — Z515 Encounter for palliative care: Secondary | ICD-10-CM

## 2022-11-25 DIAGNOSIS — A419 Sepsis, unspecified organism: Secondary | ICD-10-CM | POA: Diagnosis not present

## 2022-11-25 DIAGNOSIS — Z7189 Other specified counseling: Secondary | ICD-10-CM | POA: Diagnosis not present

## 2022-11-25 DIAGNOSIS — N179 Acute kidney failure, unspecified: Secondary | ICD-10-CM | POA: Diagnosis not present

## 2022-11-25 DIAGNOSIS — E43 Unspecified severe protein-calorie malnutrition: Secondary | ICD-10-CM | POA: Insufficient documentation

## 2022-11-25 LAB — GLUCOSE, CAPILLARY
Glucose-Capillary: 116 mg/dL — ABNORMAL HIGH (ref 70–99)
Glucose-Capillary: 90 mg/dL (ref 70–99)
Glucose-Capillary: 94 mg/dL (ref 70–99)
Glucose-Capillary: 129 mg/dL — ABNORMAL HIGH (ref 70–99)
Glucose-Capillary: 103 mg/dL — ABNORMAL HIGH (ref 70–99)
Glucose-Capillary: 101 mg/dL — ABNORMAL HIGH (ref 70–99)

## 2022-11-25 LAB — BASIC METABOLIC PANEL
Anion gap: 13 (ref 5–15)
BUN: 64 mg/dL — ABNORMAL HIGH (ref 8–23)
CO2: 24 mmol/L (ref 22–32)
Calcium: 7.8 mg/dL — ABNORMAL LOW (ref 8.9–10.3)
Chloride: 97 mmol/L — ABNORMAL LOW (ref 98–111)
Creatinine, Ser: 3.74 mg/dL — ABNORMAL HIGH (ref 0.61–1.24)
GFR, Estimated: 15 mL/min — ABNORMAL LOW (ref >60)
Glucose, Bld: 121 mg/dL — ABNORMAL HIGH (ref 70–99)
Potassium: 4.2 mmol/L (ref 3.5–5.1)
Sodium: 134 mmol/L — ABNORMAL LOW (ref 135–145)

## 2022-11-25 LAB — LACTIC ACID, PLASMA: Lactic Acid, Venous: 1.9 mmol/L (ref 0.5–1.9)

## 2022-11-25 MED ORDER — SODIUM CHLORIDE 0.9 % IV SOLN: 8.0000 mg | Freq: Four times a day (QID) | INTRAVENOUS | Status: DC | PRN

## 2022-11-25 MED ORDER — ADULT MULTIVITAMIN W/MINERALS CH
1.0000 | ORAL_TABLET | Freq: Every day | ORAL | Status: DC
  Administered 2022-11-25 – 2022-11-30 (×6): 1 via ORAL
  Filled 2022-11-25 (×6): qty 1

## 2022-11-25 MED ORDER — SODIUM ZIRCONIUM CYCLOSILICATE 10 G PO PACK
10.0000 g | PACK | Freq: Once | ORAL | Status: AC
  Administered 2022-11-25: 10 g via ORAL
  Filled 2022-11-25: qty 1

## 2022-11-25 MED ORDER — JUVEN PO PACK
1.0000 | PACK | Freq: Two times a day (BID) | ORAL | Status: DC
  Administered 2022-11-25 – 2022-11-29 (×8): 1 via ORAL
  Filled 2022-11-25 (×7): qty 1

## 2022-11-25 MED ORDER — SODIUM CHLORIDE 0.9 % IV SOLN
2.0000 g | INTRAVENOUS | Status: DC
  Administered 2022-11-25 – 2022-11-28 (×4): 2 g via INTRAVENOUS
  Filled 2022-11-25 (×3): qty 20

## 2022-11-25 MED ORDER — SODIUM CHLORIDE 0.9 % IV SOLN
1.0000 g | INTRAVENOUS | Status: DC
  Filled 2022-11-25: qty 10

## 2022-11-25 MED ORDER — MEDIHONEY WOUND/BURN DRESSING EX PSTE
1.0000 | PASTE | Freq: Every day | CUTANEOUS | Status: DC
  Administered 2022-11-25 – 2022-11-30 (×5): 1 via TOPICAL
  Filled 2022-11-25: qty 44

## 2022-11-25 MED ORDER — ONDANSETRON HCL 4 MG/2ML IJ SOLN: 4.0000 mg | Freq: Four times a day (QID) | INTRAMUSCULAR | Status: DC | PRN

## 2022-11-25 MED ORDER — ENSURE ENLIVE PO LIQD
237.0000 mL | Freq: Two times a day (BID) | ORAL | Status: DC
  Administered 2022-11-26 – 2022-11-30 (×9): 237 mL via ORAL

## 2022-11-25 NOTE — Progress Notes (Signed)
PHARMACY - PHYSICIAN COMMUNICATION CRITICAL VALUE ALERT - BLOOD CULTURE IDENTIFICATION (BCID)  Jesse LOUPE Sr. is an 89 y.o. male who presented to [facility] on 11/24/2022 with a chief complaint of AMS.  Assessment:  4/4 blood culture positive with proteus, no resistance detected. Source likely urinary.  Name of physician (or Provider) Contacted: Janeann Forehand, NP  Current antibiotics: cefepime  Changes to prescribed antibiotics recommended:  Currently covered with cefepime, recommend narrowing therapy to ceftriaxone. Recommendations accepted by provider  Results for orders placed or performed during the hospital encounter of 11/24/22  Blood Culture ID Panel (Reflexed) (Collected: 11/24/2022  3:53 PM)  Result Value Ref Range   Enterococcus faecalis NOT DETECTED NOT DETECTED   Enterococcus Faecium NOT DETECTED NOT DETECTED   Listeria monocytogenes NOT DETECTED NOT DETECTED   Staphylococcus species NOT DETECTED NOT DETECTED   Staphylococcus aureus (BCID) NOT DETECTED NOT DETECTED   Staphylococcus epidermidis NOT DETECTED NOT DETECTED   Staphylococcus lugdunensis NOT DETECTED NOT DETECTED   Streptococcus species NOT DETECTED NOT DETECTED   Streptococcus agalactiae NOT DETECTED NOT DETECTED   Streptococcus pneumoniae NOT DETECTED NOT DETECTED   Streptococcus pyogenes NOT DETECTED NOT DETECTED   A.calcoaceticus-baumannii NOT DETECTED NOT DETECTED   Bacteroides fragilis NOT DETECTED NOT DETECTED   Enterobacterales DETECTED (A) NOT DETECTED   Enterobacter cloacae complex NOT DETECTED NOT DETECTED   Escherichia coli NOT DETECTED NOT DETECTED   Klebsiella aerogenes NOT DETECTED NOT DETECTED   Klebsiella oxytoca NOT DETECTED NOT DETECTED   Klebsiella pneumoniae NOT DETECTED NOT DETECTED   Proteus species DETECTED (A) NOT DETECTED   Salmonella species NOT DETECTED NOT DETECTED   Serratia marcescens NOT DETECTED NOT DETECTED   Haemophilus influenzae NOT DETECTED NOT DETECTED   Neisseria  meningitidis NOT DETECTED NOT DETECTED   Pseudomonas aeruginosa NOT DETECTED NOT DETECTED   Stenotrophomonas maltophilia NOT DETECTED NOT DETECTED   Candida albicans NOT DETECTED NOT DETECTED   Candida auris NOT DETECTED NOT DETECTED   Candida glabrata NOT DETECTED NOT DETECTED   Candida krusei NOT DETECTED NOT DETECTED   Candida parapsilosis NOT DETECTED NOT DETECTED   Candida tropicalis NOT DETECTED NOT DETECTED   Cryptococcus neoformans/gattii NOT DETECTED NOT DETECTED   CTX-M ESBL NOT DETECTED NOT DETECTED   Carbapenem resistance IMP NOT DETECTED NOT DETECTED   Carbapenem resistance KPC NOT DETECTED NOT DETECTED   Carbapenem resistance NDM NOT DETECTED NOT DETECTED   Carbapenem resist OXA 48 LIKE NOT DETECTED NOT DETECTED   Carbapenem resistance VIM NOT DETECTED NOT DETECTED    Romie Minus, PharmD PGY1 Pharmacy Resident  Please check AMION for all [facility] Pharmacy phone numbers After 10:00 PM, call Main Pharmacy [phone]

## 2022-11-25 NOTE — Progress Notes (Signed)
Initial Nutrition Assessment  DOCUMENTATION CODES:   Severe malnutrition in context of chronic illness  INTERVENTION:   - Ensure Enlive po BID, each supplement provides 350 kcal and 20 grams of protein  - Magic Cup TID with meals, each supplement provides 290 kcal and 9 grams of protein  - Feeding assistance with meals  - MVI with minerals daily  - 1 packet Juven BID to support wound healing, each packet provides 95 calories, 2.5 grams of protein, and 9.8 grams of carbohydrate  NUTRITION DIAGNOSIS:   Severe Malnutrition related to chronic illness as evidenced by severe fat depletion, severe muscle depletion, percent weight loss (38% weight loss in 2 months).  GOAL:   Patient will meet greater than or equal to 90% of their needs  MONITOR:   PO intake, Labs, Weight trends, Skin, Supplement acceptance, I & O's  REASON FOR ASSESSMENT:   Consult Assessment of nutrition requirement/status, Wound healing, Poor PO  ASSESSMENT:   89 year old male who presented to the ED from SNF on 8/07 with AMS. PMH of HTN, PAF, PAD (s/p R foot fifth ray amputation 08/2022), chronic indwelling urinary catheter, BPH, prostate cancer. Pt admitted with presumed septic shock from a urologic source, hemorrhagic shock.  08/08 - clear liquid diet, later advanced to regular diet with thin liquids  Discussed pt with RN and during ICU rounds. Pt with bacteremia secondary to urinary source, gross hematuria, and AKI. Per notes, pt has not been out of bed much since April and is very deconditioned.  Pt off pressors at time of visit. RN reports pt coughed with applesauce.  Spoke with pt at bedside. Pt reports that his appetite is okay. He shares that his appetite has decreased and he has been eating less over the last several months. Noted pt presented from [facility]. Pt reports being provided with 3 meals daily and typically eating 2-3 of these meals. He reports that he eats most of the food on his  trays.  Pt endorses weight loss but is unsure how much. He reports a UBW of 225-230 lbs. Current weight is 120.8 lbs (54.8 kg). Reviewed weight history in chart. Pt with a documented 34.1 kg weight loss since 09/24/22. This is a 38% weight loss in 2 months which is severe and significant for timeframe. Current weight may be falsely elevated; per nursing edema assessment, pt with very deep pitting edema to RLE, mild pitting edema to LLE, and very deep pitting edema to perineum.  Pt seen by SLP after RD visit. Per SLP note, pt tolerated all consistencies trials with no clinical s/s of aspiration and exhibited good oral clearance of solids. Pt expressed to SLP that he needs feeding assistance. Diet was advanced to Regular with thin liquids.  RD will order oral nutrition supplements between meals and with meals to aid pt in meeting kcal and protein needs. Discussed importance of adequate PO intake with pt at bedside. Will also order feeding assistance with meals and MVI with minerals.  Medications reviewed and include: IV protonix, IV abx  Labs reviewed: sodium 133, BUN 60, creatinine 3.67, lactic acid 3.3, WBC 39.0, hemoglobin 8.6, INR 3.7 CBG's: 90-156 x 24 hours  UOP: 1250 ml x 12 hours I/O's: +238 ml since admit  NUTRITION - FOCUSED PHYSICAL EXAM:  Flowsheet Row Most Recent Value  Orbital Region Severe depletion  Upper Arm Region Moderate depletion  Thoracic and Lumbar Region Moderate depletion  Buccal Region Severe depletion  Temple Region Severe depletion  Clavicle Bone Region  Severe depletion  Clavicle and Acromion Bone Region Severe depletion  Scapular Bone Region Moderate depletion  Dorsal Hand Severe depletion  Patellar Region Moderate depletion  Anterior Thigh Region Moderate depletion  Posterior Calf Region Moderate depletion  Edema (RD Assessment) Moderate to Severe  Hair Reviewed  Eyes Reviewed  Mouth Reviewed  Skin Reviewed  Nails Reviewed    Diet Order:   Diet Order              Diet regular Room service appropriate? Yes; Fluid consistency: Thin  Diet effective now                   EDUCATION NEEDS:   Education needs have been addressed  Skin:  Skin Assessment: Skin Integrity Issues: DTI: L heel Stage II: R buttocks Stage IV: coccyx Unstageable: R heel  Last BM:  11/24/22  Height:   Ht Readings from Last 1 Encounters:  11/24/22 5\' 7"  (1.702 m)    Weight:   Wt Readings from Last 1 Encounters:  11/25/22 54.8 kg    BMI:  Body mass index is 18.92 kg/m.  Estimated Nutritional Needs:   Kcal:  1700-1900  Protein:  85-100 grams  Fluid:  1.7-1.9 L    Mertie Clause, MS, RD, LDN Registered Dietitian II Please see AMiON for contact information.

## 2022-11-25 NOTE — Consult Note (Signed)
WOC Nurse Consult Note: Reason for Consult: pressure injuries Patient admitted from SNF with AMS, history of PAD, HTN, chronic indwelling FC. History of metatarsal amputations.  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  Stage 4 Pressure Injury: sacrum; 25% non viable tissue/75% pink, non granular Stage 3 Pressure Injury: right ischium; 100% pink  Pressure Injury POA: Yes Measurement: see nursing notes  Wound bed: see above  Drainage (amount, consistency, odor) serosanguinous from the ischium and sacral wound Periwound: intact  Dressing procedure/placement/frequency: Paint left great toe with betadine daily Prevalon boots bilaterally in high risk patient,history of PAD leptospermum honey to wound bed, pack with saline moist gauze, top with dry dressing. Change daily Silicone foam to the right ischium, change every 3 days and PRN soilage Consult RD for nutritional supplementation for wound healing.  Low air loss mattress in place for moisture management and pressure redistribution   Discussed POC with patient and bedside nurse.  Re consult if needed, will not follow at this time. Thanks   [employer], RN,CWOCN, CNS, CWON-AP [phone])

## 2022-11-25 NOTE — Progress Notes (Signed)
NAME:  Jesse Bortner., MRN:  [id number], DOB:  [birth year], LOS: 1 ADMISSION DATE:  11/24/2022 CONSULTATION DATE:  11/24/2022 REFERRING MD:  Deretha Emory - EDP ([facility]) CHIEF COMPLAINT:  AMS, suspected urosepsis   History of Present Illness:  89 year old man who presented to [facility] ED 8/7 from SNF [facility]) for AMS. PMHx significant for HTN, PAF (on Eliquis), PAD (c/b critical limb ischemia, s/p R foot fifth ray amputation 08/2022), chronic indwelling urinary catheter with history of UTI, BPH, prostate CA. Recent admission 5/7 - 5/24 for critical limb ischemia of RLE/osteomyelitis requiring ray amputation and WV; he was discharged to inpatient rehab (5/24 - 6/7) then to SNF. General functional decline since the beginning of 2024.  Patient is altered, therefore history is primarily obtained from family and from chart review. Family reports that patient had been alert and at his baseline 8/7AM but became altered after lunch. Noted that patient has been somewhat unwell since 8/3 with several episodes of nausea with vomiting. On ED arrival, patient was afebrile, HR 84, hypotensive with BP 80/52, RR 20, SpO2 98%. Labs were notable for WBC 39, Hgb 6.0 (baseline 8-10), Plt 398. INR 5.2. Na 133, K 4.1, CO2 22, BUN/Cr 67/3.94, albumin 1.9; LFTs WNL. LA 2.8 > 8.6. UA turbid with moderate Hgb, >300 protein, large leuks, nitrite negative. UCx and BCx pending and broad-spectrum antibiotics started. COVID/Flu/RSV negative. 2U PRBCs ordered for repeat Hgb 4.5 (6.0). CXR unremarkable.  PCCM consulted for ICU admission and further management, concern for urosepsis +/- GIB.  Pertinent Medical History:   Past Medical History:  Diagnosis Date   Chronic indwelling Foley catheter    Followed by Urology   Hypertension    Kidney calculus 2014;2013   PAD (peripheral artery disease) (HCC)    S/p R foot fifth ray amputation 08/2022 ([facility])   PAF (paroxysmal atrial fibrillation) (HCC)    On Eliquis   Pneumonia    Prostate  cancer (HCC)    Shortness of breath    Significant Hospital Events: Including procedures, antibiotic start and stop dates in addition to other pertinent events   8/7 - Presented to [facility] ED altered, hypotensive, c/f sepsis. Urologic source suspected with turbid/frank pus Foley output. Hgb also noted to be 4.5 (6.0), 2U PRBCs given. Transferred to [facility] for further care. 8/8 hematuria in the setting of elevated INR continued overnight resulting in need for Foley flushes.  Vasopressor support decreasing  Interim History / Subjective:  Reports some physical discomfort in bed   Objective:  Blood pressure 125/66, pulse 69, temperature 97.9 F (36.6 C), temperature source Oral, resp. rate 14, height 5\' 7"  (1.702 m), weight 54.8 kg, SpO2 100%.        Intake/Output Summary (Last 24 hours) at 11/25/2022 0737 Last data filed at 11/25/2022 4098 Gross per 24 hour  Intake 1470.71 ml  Output 1250 ml  Net 220.71 ml   Filed Weights   11/24/22 1558 11/24/22 2202 11/25/22 0329  Weight: 85.3 kg 54.8 kg 54.8 kg   Physical Examination: General: No acute on chronic ill-appearing elderly frail deconditioned male lying in bed in no acute distress HEENT: ETT, MM pink/moist, PERRL,  Neuro: Alert and oriented x 3, some decreased cognitive function identified at baseline CV: s1s2 regular rate and rhythm, no murmur, rubs, or gallops,  PULM: Clear to auscultation bilaterally, no increased work of breathing, good breath sounds GI: soft, bowel sounds active in all 4 quadrants, non-tender, non-distended, tolerating oral diet Extremities: warm/dry, no edema  Skin:  no rashes or lesions    Resolved Hospital Problem List:    Assessment & Plan:  Undifferentiated shock, presumed septic (urologic source) versus hypovolemic/hemorrhagic Lactic acidosis -Febrile and hypotensive on [facility] ED arrival; Hgb at [facility] 4.5. -Procalcitonin 54.02 admission P: Continue pressors for MAP goal greater than 65, currently requiring 1 mcg  of Levophed Follow cultures Continue cefepime and vancomycin Trend CBC and fever curve  Anemia in the setting of gross hematuria, less likely GIB Elevated INR -2U PRBCs transfused at [facility]/en route to [facility] for Hgb 4.5. INR 5.4, on Eliquis at home. Synthetic liver function appears normal. Gross hematuria noted in Foley bag on arrival to [facility]. FOBT+ but DRE negative for blood. P: Hemoglobin remained stable this a.m. Trend CBC Transfuse per protocol Hemoglobin goal greater than 7 Twice daily PPI INR downtrending 3.7 status post vitamin K  Chronic indwelling Foley catheter History of UTI Concern for urosepsis, +pyuria Prostate CA -Follows as outpatient with Urology. Per last office visit (Dr. Retta Diones), patient has progressive prostate CA with negative bone imaging 2023, remains relatively asymptomatic. No longer requiring Flomax. P: Antibiotics as above Follow cultures Continue Foley catheter At risk for clot obstruction with Foley, monitor for signs of retention Continue every 2 hours flushes via Foley Threshold for urology consult/CBI  Paroxysmal Afib LE edema -Followed as outpatient by Dr. Rosemary Holms, last seen 7/22.  -CHA2DS2-VASc Score 5. P: Continuous telemetry On optimize electrolytes, K greater than 4, mag greater than 2 Anticoagulation on hold given elevated INR in signs of active bleed Hold initiation of heparin drip given continued hematuria Repeat echo pending Monitor for need to continue diuresing  PAD Critical limb ischemia of RLE requiring fifth partial ray amputation History of osteomyelitis -Recent admission 5/7 - 5/24 for critical limb ischemia of RLE/osteomyelitis requiring ray amputation and WV. Underwent abdominal aortogram with runoff 5/16 with [facility] Karin Lieu) with recanalization and ballon angioplasty of tibioperoneal trunk. Ultimately required R fifth partial ray amputation 5/22 with Ortho Lajoyce Corners). Admission c/b osteomyelitis and bacteremia  (Aerococcus/Actinotignum schaalii).  P: Shock state improving, low suspicion for recurrent infection Reconsult ID if blood cultures remain positive Local wound care Frequent neurovascular checks Continue statin, resume aspirin when appropriate  Physical deconditioning At risk for malnutrition Hypoalbuminemia -Discharged to inpatient rehab (5/24 - 6/7) then to SNF. General functional decline since the beginning of 2024. P: RD/nutrition following PT/OT/SLP as able Consider PMT consult  Goals of care Given advanced age with multiple comorbidities and decreased quality of life, I attempted to elicit goals of care discussion this a.m. the patient was cognitively unable to comprehend topic.  Will call and discuss goals of care with family   Best Practice: (right click and "Reselect all SmartList Selections" daily)   Diet/type: NPO DVT prophylaxis: SCDs, hold Eliquis GI prophylaxis: PPI Lines: N/A Foley:  Yes, and it is still needed (chronic) Code Status:  full code Last date of multidisciplinary goals of care discussion [Pending]    Critical care time:   CRITICAL CARE Performed by:  D. Harris  Total critical care time: 38 minutes  Critical care time was exclusive of separately billable procedures and treating other patients.  Critical care was necessary to treat or prevent imminent or life-threatening deterioration.  Critical care was time spent personally by me on the following activities: development of treatment plan with patient and/or surrogate as well as nursing, discussions with consultants, evaluation of patient's response to treatment, examination of patient, obtaining history from patient or surrogate, ordering and performing treatments and  interventions, ordering and review of laboratory studies, ordering and review of radiographic studies, pulse oximetry and re-evaluation of patient's condition.    D. Harris, NP-C [facility] Personal contact information can be found on Amion  If no contact or response made please call [phone] 11/25/2022, 7:49 AM

## 2022-11-25 NOTE — Evaluation (Signed)
Occupational Therapy Evaluation Patient Details Name: Jesse LUCKER Sr. MRN: [id number] DOB: 1929-01-19 Today's Date: 11/25/2022   History of Present Illness 89 yo male adm 8/7 with hematuria, encephalopathy, sepsis. PMhx: prostate CA, BPH, chronic foley, HTN, Rt 5th ray amp, PAD, Afib   Clinical Impression   PTA, pt recently at SNF at total A level and has experienced decreased mobility since April per pt. PT reporting very interested in ADL but poor UB strength after her session despite this being near baseline. Pt reporting not long ago since he was able to self feed. Assessing pt potential to self feed and pt with good potential. Able to reach to face with BUE, but needing assistance with both grooming and feeding due to poor coordination and strength. Will continue to follow to optimize UE strength and functional use to optimize quality of life and ability to engage in ADL. Recommending SNF without follow up therapy at this time, but will continue to assess appropriateness for further mobility.        If plan is discharge home, recommend the following: Two people to help with walking and/or transfers;Two people to help with bathing/dressing/bathroom;Assistance with cooking/housework;Direct supervision/assist for medications management;Assistance with feeding;Direct supervision/assist for financial management;Assist for transportation;Help with stairs or ramp for entrance    Functional Status Assessment  Patient has had a recent decline in their functional status and demonstrates the ability to make significant improvements in function in a reasonable and predictable amount of time.  Equipment Recommendations  Hospital bed;Hoyer lift;Wheelchair (measurements OT);Wheelchair cushion (measurements OT)    Recommendations for Other Services       Precautions / Restrictions Precautions Precautions: Fall;Other (comment) Precaution Comments: bil heel wounds Restrictions Weight Bearing  Restrictions: No      Mobility Bed Mobility Overal bed mobility: Needs Assistance             General bed mobility comments: Per PT total A; PT reporting pt grossly total A at baseline, and asking OT to see pt to assess ability to self feed    Transfers                   General transfer comment: deferred      Balance                                           ADL either performed or assessed with clinical judgement   ADL Overall ADL's : Needs assistance/impaired Eating/Feeding: Minimal assistance;Bed level Eating/Feeding Details (indicate cue type and reason): supported in chair piosition in chair. Pt with weakness and undershooting thus, needing min A to bring cup to mouth as well as min A to stablize hand when holding apple sauce. Tremor affecting ability to self feed Grooming: Wash/dry face;Minimal assistance;Bed level Grooming Details (indicate cue type and reason): to adjust cloth in hand and sustain contact with face                               General ADL Comments: Pt otherwise total A for ADL at this time.     Vision Ability to See in Adequate Light: 0 Adequate Patient Visual Report: No change from baseline Vision Assessment?: No apparent visual deficits Additional Comments: WFL for tasks assessed     Perception         Praxis  Pertinent Vitals/Pain Pain Assessment Pain Assessment: Faces Faces Pain Scale: No hurt     Extremity/Trunk Assessment Upper Extremity Assessment Upper Extremity Assessment: Right hand dominant;Left hand dominant RUE Deficits / Details: grossly 2/5, pt unable to lift arms from under the covers, weak grip and limited ROM LUE Deficits / Details: grossly 2/5, pt unable to lift arms from under the covers, weak grip and limited ROM   Lower Extremity Assessment Lower Extremity Assessment: Defer to PT evaluation   Cervical / Trunk Assessment Cervical / Trunk Assessment: Other  exceptions;Kyphotic Cervical / Trunk Exceptions: very forward head with partial right lateral flexion   Communication Communication Communication: Hearing impairment   Cognition Arousal: Alert Behavior During Therapy: Flat affect Overall Cognitive Status: Impaired/Different from baseline Area of Impairment: Safety/judgement                         Safety/Judgement: Decreased awareness of deficits     General Comments: decreasd awareness of deficits; poor insight. Pt very motivated to self feed. RN cleared for water and a few bites of apple sauce. Pt with audible swallow sounds and min coughing after 2 bites of apple sauce. Pt with mod difficulty understanding why OT did not want to proceed with PO.     General Comments  VSS    Exercises     Shoulder Instructions      Home Living Family/patient expects to be discharged to:: Skilled nursing facility                                        Prior Functioning/Environment Prior Level of Function : Needs assist       Physical Assist : Mobility (physical);ADLs (physical) Mobility (physical): Transfers;Bed mobility ADLs (physical): Feeding;Grooming;Bathing;Dressing;IADLs;Toileting Mobility Comments: pt reports he was walking until May since then has progressively declined and now requires total assist for all mobility with hoyer lift bed to chair ADLs Comments: total assist, no longer self-feeding which pt reports is more recent        OT Problem List: Decreased strength;Impaired balance (sitting and/or standing);Decreased activity tolerance;Decreased safety awareness;Impaired UE functional use      OT Treatment/Interventions: Self-care/ADL training;Therapeutic exercise;DME and/or AE instruction;Canalith reposition;Balance training;Therapeutic activities    OT Goals(Current goals can be found in the care plan section) Acute Rehab OT Goals Patient Stated Goal: be able to feed and groom myself OT Goal  Formulation: With patient Time For Goal Achievement: 12/09/22 Potential to Achieve Goals: Good  OT Frequency: Min 1X/week    Co-evaluation              AM-PAC OT "6 Clicks" Daily Activity     Outcome Measure Help from another person eating meals?: A Little Help from another person taking care of personal grooming?: A Lot Help from another person toileting, which includes using toliet, bedpan, or urinal?: Total Help from another person bathing (including washing, rinsing, drying)?: Total Help from another person to put on and taking off regular upper body clothing?: Total Help from another person to put on and taking off regular lower body clothing?: Total 6 Click Score: 9   End of Session Nurse Communication: Mobility status  Activity Tolerance: Patient tolerated treatment well Patient left: in bed;with call bell/phone within reach;with bed alarm set  OT Visit Diagnosis: Muscle weakness (generalized) (M62.81)  Time: 1610-9604 OT Time Calculation (min): 22 min Charges:  OT General Charges $OT Visit: 1 Visit OT Evaluation $OT Eval Moderate Complexity: 1 Mod  [name], [name] [facility] Acute Rehabilitation Office: [phone]   [name] 11/25/2022, 2:46 PM

## 2022-11-25 NOTE — TOC Initial Note (Signed)
Transition of Care (TOC) - Initial/Assessment Note    Patient Details  Name: [name]. MRN: [id number] Date of Birth: 11-05-1928  Transition of Care [facility]) CM/SW Contact:    Deatra Robinson, [place] Phone Number: 11/25/2022, 1:14 PM  Clinical Narrative:  pt admitted from [facility]. Spoke to [place] in admissions who confirmed pt is a Ambulance person resident but they have been in talks with pt's dtr re LTC placement.    Call placed to pt's dtr Windell Moulding [phone] who confirmed she is considering LTC at [facility] after Medicare benefits exhausted vs taking pt home with private caregivers. Dtr states she has an appointment with [facility] business office today and will update SW on decisions but currently plans for pt to return to [facility].    Per Eunice Blase at [facility], pt is able to return at dc. SW will follow.                   Expected Discharge Plan: Skilled Nursing Facility Barriers to Discharge: Continued Medical Work up   Patient Goals and CMS Choice            Expected Discharge Plan and Services       Living arrangements for the past 2 months: Skilled Nursing Facility                                      Prior Living Arrangements/Services Living arrangements for the past 2 months: Skilled Nursing Facility Lives with:: Facility Resident Patient language and need for interpreter reviewed:: No        Need for Family Participation in Patient Care: Yes (Comment) Care giver support system in place?: Yes (comment)   Criminal Activity/Legal Involvement Pertinent to Current Situation/Hospitalization: No - Comment as needed  Activities of Daily Living      Permission Sought/Granted Permission sought to share information with : Oceanographer granted to share information with : Yes, Verbal Permission Granted              Emotional Assessment       Orientation: : Fluctuating Orientation (Suspected  and/or reported Sundowners), Oriented to  Time, Oriented to Place, Oriented to Self Alcohol / Substance Use: Not Applicable Psych Involvement: No (comment)  Admission diagnosis:  Sepsis (HCC) [A41.9] Gastrointestinal hemorrhage, unspecified gastrointestinal hemorrhage type [K92.2] Sepsis, due to unspecified organism, unspecified whether acute organ dysfunction present [facility]) [A41.9] Patient Active Problem List   Diagnosis Date Noted   Sepsis (HCC) 11/24/2022   Intensive care (ICU) myopathy 10/29/2022   Amputation of fifth toe of right foot (HCC) 10/29/2022   Leg edema 09/23/2022   Hyponatremia 09/23/2022   Azotemia 09/22/2022   Prostate cancer (HCC) 09/22/2022   Chronic anticoagulation 09/17/2022   Anemia 09/16/2022   Constipation 09/16/2022   Debility 09/10/2022   PAD (peripheral artery disease) (HCC) 09/01/2022   Osteomyelitis of fifth toe of right foot (HCC) 08/31/2022   Bacteremia 08/31/2022   Acute cystitis without hematuria 08/31/2022   Polymicrobial bacterial infection 08/26/2022   Fever of undetermined origin 08/25/2022   Lactic acidosis 08/25/2022   Generalized weakness 08/25/2022   Bronchitis 08/25/2022   Bilateral pleural effusion 08/25/2022   Right foot ulcer (HCC) 08/25/2022   Essential hypertension 08/25/2022   BPH (benign prostatic hyperplasia) 08/25/2022   Atrial fibrillation (HCC) 08/25/2022   PCP:  Elfredia Nevins, MD Pharmacy:   [facility] [place], [place] - [address] [place] [address] Phone: [phone] Fax: [phone]  [facility] - [place], [place] - [address] [place] [address] Phone: [phone] Fax: [phone]     Social Determinants of Health (SDOH) Social History: SDOH Screenings   Food Insecurity: No Food Insecurity (08/25/2022)  Housing: Low Risk  (08/25/2022)  Transportation Needs: No Transportation Needs (08/25/2022)  Utilities: Not At Risk  (08/25/2022)  Alcohol Screen: Low Risk  (07/24/2019)  Depression (PHQ2-9): Low Risk  (07/24/2019)  Financial Resource Strain: Low Risk  (07/24/2019)  Physical Activity: Inactive (07/24/2019)  Social Connections: Moderately Integrated (07/24/2019)  Stress: No Stress Concern Present (07/24/2019)  Tobacco Use: Low Risk  (11/24/2022)   SDOH Interventions:     Readmission Risk Interventions     No data to display

## 2022-11-25 NOTE — Consult Note (Signed)
erroe

## 2022-11-25 NOTE — TOC Transition Note (Deleted)
Transition of Care [facility]) - CM/SW Discharge Note   Patient Details  Name: Jesse VIPPERMAN Sr. MRN: [id number] Date of Birth: 09-06-1928  Transition of Care [facility]) CM/SW Contact:  Deatra Robinson, [place] Phone Number: 11/25/2022, 1:10 PM   Clinical Narrative: pt admitted from [facility]. Spoke to [place] in admissions who confirmed pt is a Ambulance person resident but they have been in talks with pt's dtr re LTC placement.   Call placed to pt's dtr Windell Moulding [phone] who confirmed she is considering LTC at [facility] after Medicare benefits exhausted vs taking pt home with private caregivers. Dtr states she has an appointment with [facility] business office today and will update SW on decisions but currently plans for pt to return to [facility].   Per Eunice Blase at [facility], pt is able to return at dc. SW will follow.   Dellie Kaminsky, MSW, LCSW [phone] (coverage)      Final next level of care: Skilled Nursing Facility Barriers to Discharge: Continued Medical Work up   Patient Goals and CMS Choice      Discharge Placement                         Discharge Plan and Services Additional resources added to the After Visit Summary for                                       Social Determinants of Health (SDOH) Interventions SDOH Screenings   Food Insecurity: No Food Insecurity (08/25/2022)  Housing: Low Risk  (08/25/2022)  Transportation Needs: No Transportation Needs (08/25/2022)  Utilities: Not At Risk (08/25/2022)  Alcohol Screen: Low Risk  (07/24/2019)  Depression (PHQ2-9): Low Risk  (07/24/2019)  Financial Resource Strain: Low Risk  (07/24/2019)  Physical Activity: Inactive (07/24/2019)  Social Connections: Moderately Integrated (07/24/2019)  Stress: No Stress Concern Present (07/24/2019)  Tobacco Use: Low Risk  (11/24/2022)     Readmission Risk Interventions     No data to display

## 2022-11-25 NOTE — Progress Notes (Signed)
Date and time results received: 11/25/22 1020  Test: K Critical Value: 6.3  Name of Provider Notified: Janeann Forehand NP  Orders Received? Or Actions Taken?: Orders Received - See Orders for details

## 2022-11-25 NOTE — Evaluation (Signed)
Clinical/Bedside Swallow Evaluation Patient Details  Name: [name] Sr. MRN: [id number] Date of Birth: 08-19-1928  Today's Date: 11/25/2022 Time: SLP Start Time (ACUTE ONLY): 1233 SLP Stop Time (ACUTE ONLY): 1244 SLP Time Calculation (min) (ACUTE ONLY): 11 min  Past Medical History:  Past Medical History:  Diagnosis Date   Chronic indwelling Foley catheter    Followed by Urology   Hypertension    Kidney calculus 2014;2013   PAD (peripheral artery disease) (HCC)    S/p R foot fifth ray amputation 08/2022 [facility])   PAF (paroxysmal atrial fibrillation) (HCC)    On Eliquis   Pneumonia    Prostate cancer (HCC)    Shortness of breath    Past Surgical History:  Past Surgical History:  Procedure Laterality Date   ABDOMINAL AORTOGRAM W/LOWER EXTREMITY N/A 09/02/2022   Procedure: ABDOMINAL AORTOGRAM W/LOWER EXTREMITY;  Surgeon: Victorino Sparrow, MD;  Location: [facility] INVASIVE CV LAB;  Service: Vascular;  Laterality: N/A;   AMPUTATION Right 09/08/2022   Procedure: RIGHT FOOT FIFTH RAY AMPUTATION;  Surgeon: [name], MD;  Location: [facility] OR;  Service: Orthopedics;  Laterality: Right;   CATARACT EXTRACTION W/PHACO Right 02/13/2013   Procedure: CATARACT EXTRACTION RIGHT EYE (WITH PHACO) AND INTRAOCULAR LENS PLACEMENT  CDE=14.53;  Surgeon: Loraine Leriche T. Nile Riggs, MD;  Location: [facility] ORS;  Service: Ophthalmology;  Laterality: Right;   CATARACT EXTRACTION W/PHACO Left 02/27/2013   Procedure: CATARACT EXTRACTION PHACO AND INTRAOCULAR LENS PLACEMENT (IOC);  Surgeon: Loraine Leriche T. Nile Riggs, MD;  Location: [facility] ORS;  Service: Ophthalmology;  Laterality: Left;  CDE:12.54   COLONOSCOPY     JOINT REPLACEMENT Right 1993;1980   Right x2   HPI:  [name] is a 89 year old man who presented to [facility] ED 8/7 from SNF [facility]) for AMS.  Workup concerning for urosepsis, possible GIB.  CXR 8/7 with no acute findings. PMHx significant for HTN, PAF (on Eliquis), PAD (c/b critical limb ischemia, s/p R foot fifth ray amputation  08/2022), chronic indwelling urinary catheter with history of UTI, BPH, prostate CA. Recent admission 5/7 - 5/24 for critical limb ischemia of RLE/osteomyelitis requiring ray amputation and WV; he was discharged to inpatient rehab (5/24 - 6/7) then to SNF. General functional decline since the beginning of 2024.    Assessment / Plan / Recommendation  Clinical Impression  Pt presents with functional swallowing as assessed clinically. Pt tolerated all consistencies trialed with no clinical s/s of aspiration and exhibited good oral clearance of solids.  Pt expresses he needs help with feeding.  He indicates readiness for another bite after swallowing.  Pt may advance to regular texture diet as tolerated once suspicion for GIB has passed.  Pt does not have any further ST needs.   Recommend continuing clear liquids at present and advance diet texture as medically appropriate to regular solids.   SLP Visit Diagnosis: Dysphagia, unspecified (R13.10)    Aspiration Risk  No limitations    Diet Recommendation Regular;Thin liquid (as medicallly appropriate)    Liquid Administration via: Cup;Straw Medication Administration: Whole meds with liquid Supervision: Staff to assist with self feeding Compensations: Slow rate;Small sips/bites Postural Changes: Seated upright at 90 degrees    Other  Recommendations Oral Care Recommendations: Oral care BID    Recommendations for follow up therapy are one component of a multi-disciplinary discharge planning process, led by the attending physician.  Recommendations may be updated based on patient status, additional functional criteria and insurance authorization.  Follow up Recommendations No SLP follow up  Assistance Recommended at Discharge    Functional Status Assessment Patient has not had a recent decline in their functional status  Frequency and Duration  (N/A)          Prognosis Prognosis for improved oropharyngeal function:  (N/A)       Swallow Study   General Date of Onset: 11/24/22 HPI: [name] is a 89 year old man who presented to [facility] ED 8/7 from SNF [facility]) for AMS.  Workup concerning for urosepsis, possible GIB.  CXR 8/7 with no acute findings. PMHx significant for HTN, PAF (on Eliquis), PAD (c/b critical limb ischemia, s/p R foot fifth ray amputation 08/2022), chronic indwelling urinary catheter with history of UTI, BPH, prostate CA. Recent admission 5/7 - 5/24 for critical limb ischemia of RLE/osteomyelitis requiring ray amputation and WV; he was discharged to inpatient rehab (5/24 - 6/7) then to SNF. General functional decline since the beginning of 2024. Type of Study: Bedside Swallow Evaluation Previous Swallow Assessment: None Diet Prior to this Study: Clear liquid diet Temperature Spikes Noted: No Respiratory Status: Room air History of Recent Intubation: No Behavior/Cognition: Alert;Cooperative;Pleasant mood Oral Cavity Assessment: Within Functional Limits Oral Care Completed by SLP: No Oral Cavity - Dentition: Adequate natural dentition Self-Feeding Abilities: Needs assist Patient Positioning: Upright in bed Baseline Vocal Quality: Normal Volitional Cough: Strong Volitional Swallow: Able to elicit    Oral/Motor/Sensory Function Overall Oral Motor/Sensory Function: Mild impairment Facial ROM: Within Functional Limits Facial Symmetry: Within Functional Limits Lingual ROM: Within Functional Limits Lingual Symmetry: Within Functional Limits Lingual Strength: Reduced Velum: Within Functional Limits Mandible: Within Functional Limits   Ice Chips Ice chips: Not tested   Thin Liquid Thin Liquid: Within functional limits Presentation: Straw    Nectar Thick Nectar Thick Liquid: Not tested   Honey Thick Honey Thick Liquid: Not tested   Puree Puree: Within functional limits Presentation: Spoon   Solid     Solid: Within functional limits Presentation:  (SLP fed)      Kerrie Pleasure, MA,  CCC-SLP Acute Rehabilitation Services Office: [phone] 11/25/2022,12:57 PM

## 2022-11-25 NOTE — Evaluation (Signed)
Physical Therapy Evaluation Patient Details Name: [name]. MRN: [id number] DOB: [birth year] Today's Date: 11/25/2022  History of Present Illness  89 yo male adm 8/7 with hematuria, encephalopathy, sepsis. PMhx: prostate CA, BPH, chronic foley, HTN, Rt 5th ray amp, PAD, Afib  Clinical Impression  Pt pleasant and reports walking until May when he was hospitalized and then went to SNF. Pt with progressive decline to the point that he is currently bed bound, total care with lift OOB to chair via staff at facility. Pt no longer receives therapy at his facility and requires assist for feeding and all ADLs/IADLs. Pt noted to have bil heel wounds with RN notified and need for prevalon. Pt at baseline functional status and not currently appropriate for acute therapy. Will sign off with daily ROM position changes via nursing recommended.       If plan is discharge home, recommend the following: Two people to help with walking and/or transfers;Two people to help with bathing/dressing/bathroom;Assistance with feeding;Direct supervision/assist for medications management;Assist for transportation   Can travel by private vehicle        Equipment Recommendations Hospital bed;Hoyer lift;Wheelchair (measurements PT)  Recommendations for Other Services       Functional Status Assessment Patient has not had a recent decline in their functional status     Precautions / Restrictions Precautions Precautions: Fall;Other (comment) Precaution Comments: bil heel wounds Restrictions Weight Bearing Restrictions: No      Mobility  Bed Mobility Overal bed mobility: Needs Assistance Bed Mobility: Supine to Sit, Sit to Supine     Supine to sit: Total assist     General bed mobility comments: total assist with bed egress function to achieve sitting. pt able to grasp rails and pull shoulders partially off surface but unable to fully clear trunk. Total assist for all mobility    Transfers                         Ambulation/Gait                  Stairs            Wheelchair Mobility     Tilt Bed    Modified Rankin (Stroke Patients Only)       Balance                                             Pertinent Vitals/Pain Pain Assessment Pain Assessment: No/denies pain    Home Living Family/patient expects to be discharged to:: Skilled nursing facility                        Prior Function Prior Level of Function : Needs assist       Physical Assist : Mobility (physical);ADLs (physical) Mobility (physical): Transfers;Bed mobility ADLs (physical): Feeding;Grooming;Bathing;Dressing;IADLs;Toileting Mobility Comments: pt reports he was walking until May since then has progressively declined and now requires total assist for all mobility with hoyer lift bed to chair ADLs Comments: total assist, no longer self-feeding     Extremity/Trunk Assessment   Upper Extremity Assessment Upper Extremity Assessment: RUE deficits/detail;LUE deficits/detail RUE Deficits / Details: grossly 2/5, pt unable to lift arms from under the covers, weak grip and limited ROM LUE Deficits / Details: grossly 2/5, pt unable to lift arms from under the covers, weak  grip and limited ROM    Lower Extremity Assessment Lower Extremity Assessment: LLE deficits/detail;RLE deficits/detail RLE Deficits / Details: 1/5 strength with hip flexor and quadricep activation LLE Deficits / Details: 2-/5 hip flexion and knee extension    Cervical / Trunk Assessment Cervical / Trunk Assessment: Other exceptions;Kyphotic Cervical / Trunk Exceptions: very forward head with partial right lateral flexion  Communication   Communication Communication: Hearing impairment  Cognition Arousal: Alert Behavior During Therapy: Flat affect Overall Cognitive Status: Impaired/Different from baseline Area of Impairment: Safety/judgement                          Safety/Judgement: Decreased awareness of deficits              General Comments      Exercises     Assessment/Plan    PT Assessment All further PT needs can be met in the next venue of care  PT Problem List Decreased strength;Decreased mobility;Decreased range of motion;Decreased activity tolerance;Decreased balance;Decreased skin integrity       PT Treatment Interventions      PT Goals (Current goals can be found in the Care Plan section)  Acute Rehab PT Goals PT Goal Formulation: All assessment and education complete, DC therapy    Frequency       Co-evaluation               AM-PAC PT "6 Clicks" Mobility  Outcome Measure Help needed turning from your back to your side while in a flat bed without using bedrails?: Total Help needed moving from lying on your back to sitting on the side of a flat bed without using bedrails?: Total Help needed moving to and from a bed to a chair (including a wheelchair)?: Total Help needed standing up from a chair using your arms (e.g., wheelchair or bedside chair)?: Total Help needed to walk in hospital room?: Total Help needed climbing 3-5 steps with a railing? : Total 6 Click Score: 6    End of Session   Activity Tolerance: Patient tolerated treatment well Patient left: in bed;with call bell/phone within reach;with bed alarm set Nurse Communication: Mobility status;Need for lift equipment PT Visit Diagnosis: Other abnormalities of gait and mobility (R26.89);Muscle weakness (generalized) (M62.81)    Time: 3244-0102 PT Time Calculation (min) (ACUTE ONLY): 23 min   Charges:   PT Evaluation $PT Eval Moderate Complexity: 1 Mod   PT General Charges $$ ACUTE PT VISIT: 1 Visit         Merryl Hacker, PT Acute Rehabilitation Services Office: [phone]   Enedina Finner  11/25/2022, 10:19 AM

## 2022-11-26 DIAGNOSIS — A419 Sepsis, unspecified organism: Secondary | ICD-10-CM | POA: Diagnosis not present

## 2022-11-26 DIAGNOSIS — E43 Unspecified severe protein-calorie malnutrition: Secondary | ICD-10-CM

## 2022-11-26 DIAGNOSIS — R6521 Severe sepsis with septic shock: Secondary | ICD-10-CM | POA: Diagnosis not present

## 2022-11-26 DIAGNOSIS — N179 Acute kidney failure, unspecified: Secondary | ICD-10-CM | POA: Diagnosis not present

## 2022-11-26 DIAGNOSIS — Z7189 Other specified counseling: Secondary | ICD-10-CM | POA: Diagnosis not present

## 2022-11-26 DIAGNOSIS — Z515 Encounter for palliative care: Secondary | ICD-10-CM | POA: Diagnosis not present

## 2022-11-26 LAB — CBC
HCT: 24.6 % — ABNORMAL LOW (ref 39.0–52.0)
Hemoglobin: 8.1 g/dL — ABNORMAL LOW (ref 13.0–17.0)
MCH: 31.8 pg (ref 26.0–34.0)
MCHC: 32.9 g/dL (ref 30.0–36.0)
MCV: 96.5 fL (ref 80.0–100.0)
Platelets: 292 10*3/uL (ref 150–400)
RBC: 2.55 MIL/uL — ABNORMAL LOW (ref 4.22–5.81)
RDW: 22.2 % — ABNORMAL HIGH (ref 11.5–15.5)
WBC: 21.4 10*3/uL — ABNORMAL HIGH (ref 4.0–10.5)
nRBC: 0 % (ref 0.0–0.2)

## 2022-11-26 LAB — BASIC METABOLIC PANEL
Anion gap: 10 (ref 5–15)
BUN: 67 mg/dL — ABNORMAL HIGH (ref 8–23)
CO2: 25 mmol/L (ref 22–32)
Calcium: 7.7 mg/dL — ABNORMAL LOW (ref 8.9–10.3)
Chloride: 99 mmol/L (ref 98–111)
Creatinine, Ser: 3.32 mg/dL — ABNORMAL HIGH (ref 0.61–1.24)
GFR, Estimated: 17 mL/min — ABNORMAL LOW (ref >60)
Glucose, Bld: 81 mg/dL (ref 70–99)
Potassium: 3.6 mmol/L (ref 3.5–5.1)
Sodium: 134 mmol/L — ABNORMAL LOW (ref 135–145)

## 2022-11-26 LAB — PROTIME-INR
INR: 2.7 — ABNORMAL HIGH (ref 0.8–1.2)
Prothrombin Time: 29.3 seconds — ABNORMAL HIGH (ref 11.4–15.2)

## 2022-11-26 LAB — MAGNESIUM: Magnesium: 1.8 mg/dL (ref 1.7–2.4)

## 2022-11-26 NOTE — Plan of Care (Signed)

## 2022-11-26 NOTE — Consult Note (Signed)
Consultation Note Date: 11/26/2022   Patient Name: Jesse ANTES Sr.  DOB: 1928/04/24  MRN: [id number]  Age / Sex: 89 y.o., male  PCP: Elfredia Nevins, MD Referring Physician: Charlott Holler, MD  Reason for Consultation:   HPI/Patient Profile: 89 y.o. male  with past medical history of HTN, atrial fibrillation with chronic anticoagulation, PAD, osteomyelitis, amputation of fifth right toe, BPH, and prostate cancer admitted to [facility] on 11/24/2022 from [facility] with AMS, UTI, sepsis, and anemia (Hgb 4.5, Hct 14.5). PMT consulted to discuss GOC.  Primary Decision Maker NEXT OF KIN Daughter Krystal Eaton  Subjective: Conducted chart review and discussed with bedside nursing staff prior to visiting with patient. Mr. Mawhinney is by himself during this visit. Daughter Krystal Eaton unable to make it to hospital due to inclement weather. Mr. Salmela is a pleasant gentleman who is able to answer basic questions and remote memory is intact. He shares of his faith and how he is trusting God to help him through his current illness. He is unaware of what brought him into the hospital but does understand that he is sick. He has difficulty recalling his recent stay at [facility].  Call to daughter who shares she has noticed significant decline in her father over the past 6-8 weeks. Prior to his rehabilitation at [facility], patient lived with daughter and she provided 24/7 hands-on care. Ms. Hyman Hopes had noticed that it was getting more difficult for patient to move - she was having to physically lift him to get him out of bed. Recently, she believes that patient has been mostly bed bound. Although patient has always stayed up late, slept late, and eaten late - she reports that his appetite had decreased. She had also noticed that despite repositioning and skin care, he had skin breakdown to areas such as his buttocks and  feet.  Ms. Hyman Hopes is commended for the care that she has provided to her father and education provided regarding compromised vasculature and how lack of blood flow can impact skin integrity. Discussed overall fragile status at admission and patient's risk for repeat issues with infection, bleeding, and skin breakdown.  Ms. Hyman Hopes is tearful as she speaks of wanting to do what is right by her father as well as be present for her son who is battling cancer. Ms. Hyman Hopes is hopeful to take her father home as this is both her wish and patient's wish. She wants to provide his care, however, is unsure if she will physically be able to continue doing so. Ms. Hyman Hopes reports she had begun conversations with a Palliative Care provider out of [place]. She is realistic in that she will need support when patient discharges, whether that be help within the home or facility placement. Option of hospice as source of care and support was introduced. She is considering placement options as well as patient's code status. Reports that this is one thing she and her father never talked about. Ms. Hyman Hopes appreciative for plan to continue these conversations  over the next couple of days and when she can be present in person. She is given contact information for PMT and verbalizes understanding to call with questions or concerns.   Physical Exam Cardiovascular:     Rate and Rhythm: Rhythm irregular.     Comments: Generalized edema. Musculoskeletal:     Right lower leg: 3+ Edema present.     Left lower leg: 3+ Edema present.  Skin:    General: Skin is warm and dry.     Comments: Left great toe with dark area. 5th left toe amputated.  Neurological:     Mental Status: He is alert.     Comments: Oriented to person and place. Somewhat disoriented to time and situation. Forgetful. Remote memory intact.    Primary Diagnoses: Present on Admission:  Sepsis (HCC)  Vital Signs: BP (!) 98/45   Pulse (!) 58   Temp 97.9 F (36.6 C) (Oral)    Resp 12   Ht 5\' 7"  (1.702 m)   Wt 58.4 kg   SpO2 97%   BMI 20.16 kg/m  Pain Scale: 0-10   Pain Score: 0-No pain   SpO2: SpO2: 97 % O2 Device:SpO2: 97 % O2 Flow Rate: .   IO: Intake/output summary:  Intake/Output Summary (Last 24 hours) at 11/26/2022 0517 Last data filed at 11/26/2022 0400 Gross per 24 hour  Intake 516.97 ml  Output 1070 ml  Net -553.03 ml    LBM: Last BM Date : 11/25/22 Baseline Weight: Weight: 85.3 kg Most recent weight: Weight: 58.4 kg      SUMMARY OF RECOMMENDATIONS   Appreciate PT/OT/ST recommendations - daughter would like to take patient home but concerned she will be unable to provide level of assistance he will need Continue conversations with daughter about disease process, code status, and discharge planning over next couple of days when she is able to be present in person - at this point, she requests time to consider and no decisions made  Code Status/Advance Care Planning: Full code   Discharge Planning: To Be Determined    Signed by: Katy Apo, RN, MSN, [facility] / NP Student   Thank you for this consult. Palliative medicine will continue to follow and assist as needed.   Please contact Palliative Medicine Team phone at [phone] for questions and concerns.  For individual provider: See Amion  Gerlean Ren, DNP, [facility] Palliative Medicine Team Team Phone # [phone]  Pager # [phone]

## 2022-11-26 NOTE — Progress Notes (Signed)
Daily Progress Note   Patient Name: Jesse TRAUGH Sr.       Date: 11/26/2022 DOB: May 28, 1928  Age: 89 y.o. MRN#: [id number] Attending Physician: Joycelyn Das, MD Primary Care Physician: Elfredia Nevins, MD Admit Date: 11/24/2022  Reason for Consultation/Follow-up: Establishing goals of care  Subjective: No complaints, denies pain, feels ok, asks to be repositioned then goes back to sleep  Length of Stay: 2  Current Medications: Scheduled Meds:   Chlorhexidine Gluconate Cloth  6 each Topical Q0600   feeding supplement  237 mL Oral BID BM   leptospermum manuka honey  1 Application Topical Daily   multivitamin with minerals  1 tablet Oral Daily   nutrition supplement (JUVEN)  1 packet Oral BID BM   pantoprazole (PROTONIX) IV  40 mg Intravenous Q12H    Continuous Infusions:  sodium chloride 10 mL/hr at 11/26/22 0600   cefTRIAXone (ROCEPHIN)  IV Stopped (11/25/22 1620)    PRN Meds: docusate sodium, ondansetron (ZOFRAN) IV, mouth rinse, polyethylene glycol  Physical Exam Constitutional:      General: He is not in acute distress.    Appearance: He is ill-appearing.  Pulmonary:     Effort: Pulmonary effort is normal.  Skin:    General: Skin is warm and dry.  Neurological:     Comments: Oriented but unable to participate in decision making - easily confused and forgetful             Vital Signs: BP (!) 99/50   Pulse (!) 56   Temp 97.9 F (36.6 C) (Oral)   Resp 15   Ht 5\' 7"  (1.702 m)   Wt 58.4 kg   SpO2 95%   BMI 20.16 kg/m  SpO2: SpO2: 95 % O2 Device: O2 Device: Room Air O2 Flow Rate:    Intake/output summary:  Intake/Output Summary (Last 24 hours) at 11/26/2022 1358 Last data filed at 11/26/2022 1300 Gross per 24 hour  Intake 665.05 ml  Output 1110 ml  Net -444.95 ml   LBM:  Last BM Date : 11/25/22 Baseline Weight: Weight: 85.3 kg Most recent weight: Weight: 58.4 kg       Palliative Assessment/Data: PPS 30%      Patient Active Problem List   Diagnosis Date Noted   Protein-calorie malnutrition, severe 11/25/2022   Sepsis (HCC) 11/24/2022   Intensive care (ICU) myopathy 10/29/2022   Amputation of fifth toe of right foot (HCC) 10/29/2022   Leg edema 09/23/2022   Hyponatremia 09/23/2022   Azotemia 09/22/2022   Prostate cancer (HCC) 09/22/2022   Chronic anticoagulation 09/17/2022   Anemia 09/16/2022   Constipation 09/16/2022   Debility 09/10/2022   PAD (peripheral artery disease) (HCC) 09/01/2022   Osteomyelitis of fifth toe of right foot (HCC) 08/31/2022   Bacteremia 08/31/2022   Acute cystitis without hematuria 08/31/2022   Polymicrobial bacterial infection 08/26/2022   Fever of undetermined origin 08/25/2022   Lactic acidosis 08/25/2022   Generalized weakness 08/25/2022   Bronchitis 08/25/2022   Bilateral pleural effusion 08/25/2022   Right foot ulcer (HCC) 08/25/2022   Essential hypertension 08/25/2022   BPH (benign prostatic hyperplasia) 08/25/2022   Atrial fibrillation (HCC) 08/25/2022    Palliative Care Assessment & Plan  HPI: 89 y.o. male  with past medical history of HTN, atrial fibrillation with chronic anticoagulation, PAD, osteomyelitis, amputation of fifth right toe, BPH, and prostate cancer admitted to [facility] on 11/24/2022 from [facility] with AMS, UTI, sepsis, and anemia (Hgb 4.5, Hct 14.5). PMT consulted to discuss GOC.   Assessment: Follow up today with patient - he does not remember from yesterday. Seems a bit more sleepy today, falls asleep during conversation. No complaints. Daughter not at bedside.  Spoke with daughter via telephone. Update provided - reviewed clinical condition as well as recent labs.   Discuss goals of care moving forward. Ultimate goal for patient and daughter is for patient to return  home. She shares she needs time to work this out as she is worried about having enough support to care for him well and also needs to investigate his financial situation. She shares she is hopeful he can return to [facility] following discharge with the ultimate goal of returning home once she is able to arrange care at home.  We discuss private caregivers at home. We discuss home health support vs hospice support at home. We discuss home health focusing on rehabilitation with hospice focusing on comfort and quality of life; avoiding aggressive medical care.   She asks about my thoughts on if patient would benefit from rehab. We reviewed notes from PT and how patient really isnt able to participate much, we discuss his ongoing decline over several months. Discussed concern that he may remain bedbound. She is not surprised by this.   She tells me patient is already connected to [facility] palliative. We discuss following up with them when patient returns to [facility] to further discuss appropriate support in the home - discussed they can assist with transition to hospice if she becomes interested in this option.   We did review code status again. Encouraged [name] to consider DNR/DNI status understanding evidenced based poor outcomes in similar hospitalized patients, as the cause of the arrest is likely associated with chronic/terminal disease rather than a reversible acute cardio-pulmonary event. [name] agrees with this however she does express some hesitation over making decision as her and the patient never explicitly discussed this. She requests time to speak about this topic with her brothers and then she will call back if/when they make a decision regarding code status.   Recommendations/Plan: [name] plans for patient to dc to [facility] to allow her more time to arrange care at home - option of hospice discussed, she is already connected to [facility] palliative team outpatient Discussed concern of  ongoing decline Recommended DNR/DNI - [name] reaching out to other family members and will call back if decision is reached Clinical update provided  Code Status: Full code  Discharge Planning: Skilled Nursing Facility for rehab with Palliative care service follow-up  Care plan was discussed with patient's daughter, RN  Thank you for allowing the Palliative Medicine Team to assist in the care of this patient.  *Please note that this is a verbal dictation therefore any spelling or grammatical errors are due to the "Dragon Medical One" system interpretation.  Gerlean Ren, DNP, [facility] Palliative Medicine Team Team Phone # [phone]  Pager [phone]

## 2022-11-26 NOTE — Progress Notes (Signed)
PROGRESS NOTE    [name]  ZOX:[id number] DOB: [birth year] DOA: 11/24/2022 PCP: Elfredia Nevins, MD    Brief Narrative:   89 year old man male with past medical history of hypertension, paroxysmal atrial fibrillation on Eliquis, peripheral arterial disease status post right fifth toe amputation, chronic indwelling Foley catheter, BPH, prostate cancer who was recently admitted hospital in May 2024 for critical limb ischemia of the right lower extremity with osteomyelitis requiring ray amputation was discharged to inpatient rehab between 09/10/2022 to 09/24/2022 and then subsequently to skilled nursing facility.  He has been having general functional decline since the beginning of 2024.  At this time, patient presented from skilled nursing with altered mental status with several episodes of nausea and vomiting.    In the ED, patient was hypotensive.  Initial hemoglobin was 6.0 from baseline 8-10, WBC was elevated at 39K, INR was elevated at 5.2.  He had mild hyponatremia with sodium of 133 and creatinine was elevated at 3.9, albumin low at 1.9.  LFTs were within normal limits.  Had elevated lactic acid and urine showed large leukocytes but nitrites were negative.  Patient was given broad-spectrum antibiotics and was admitted hospital for further evaluation and treatment.  Patient was also given 2 units of packed RBC for low hemoglobin.  Patient was initially admitted to the ICU for concerns of sepsis and or bleed.    Assessment and plan  Undifferentiated shock likely septic from GI with Proteus bacteremia Lactic acidosis Was febrile and hypotensive with elevated procalcitonin at 54.  Required vasopressors during ICU stay.  Patient was initially on vancomycin and cefepime.  This has been changed to Rocephin at this time.  Acute kidney injury.  Creatinine yesterday was 3.7 from 3.64.  Baseline creatinine around 0.9.  On Foley catheter.  Continue antibiotic.  Continue strict intake and output  charting, daily weights.  Check BMP from today.  Anemia in the setting of gross hematuria, Elevated INR Had received 2 units of packed RBC at outside hospital for hemoglobin of 4.5 and INR of 5.4.  Was on Eliquis at home.  Hemoglobin 8.1. Liver function test within normal limits.  Hematuria was noted in the Foley bag.  FOBT was positive but DRE was negative for blood.  Received PPI.  INR downtrending status post vitamin K.  Will continue to monitor.  Check CBC from today.  Chronic indwelling Foley catheter History of UTI Sepsis secondary to UTI. Prostate CA Follows up with Dr. Retta Diones nausea as outpatient.  Has progressive prostate cancer.  Bone imaging was  negative on 2023.  Not on Flomax.  Currently on Foley catheter.  Continue to monitor closely.  If urinary retention/clots might need urology consultation/CBI.  Paroxysmal Afib/ Lower extremity edema. -Followed as outpatient by Dr. Rosemary Holms, last seen 7/22. CHA2DS2-VASc Score 5.  Continue telemetry.  Anticoagulation on hold due to elevated INR.  2D echocardiogram with LV ejection fraction of 65 to 70% with LVH.  PAD/Critical limb ischemia of RLE requiring fifth partial ray amputation History of osteomyelitis Patient had underwent abdominal aortogram with runoff 5/16 with [facility] Karin Lieu) with recanalization and ballon angioplasty of tibioperoneal trunk. Ultimately required R fifth partial ray amputation 5/22 with Ortho Lajoyce Corners).  Low suspicion for recurrent infection.  Culture with 100,000 colonies of gram-negative rods.  Blood cultures showing Enterobacter and Proteus species.. Continue statin, resume aspirin when appropriate   Physical deconditioning -Discharged to inpatient rehab (5/24 - 6/7) then to SNF. General functional decline since the beginning of 2024.  Will likely benefit from long-term care. PT OT recommends long-term care.  Severe protein calorie malnutrition. Hypoalbuminemia Nutrition Status: Nutrition Problem: Severe  Malnutrition Etiology: chronic illness Signs/Symptoms: severe fat depletion, severe muscle depletion, percent weight loss (38% weight loss in 2 months) Percent weight loss: 38 % Interventions: Ensure Enlive (each supplement provides 350kcal and 20 grams of protein), MVI, Magic cup, Juven  Pressure injury present on admission. Continue wound care.  Pressure Injury 08/25/22 Buttocks Right Stage 2 -  Partial thickness loss of dermis presenting as a shallow open injury with a red, pink wound bed without slough. (Active)  08/25/22   Location: Buttocks  Location Orientation: Right  Staging: Stage 2 -  Partial thickness loss of dermis presenting as a shallow open injury with a red, pink wound bed without slough.  Wound Description (Comments):   Present on Admission: Yes     Pressure Injury 11/24/22 Coccyx Medial Stage 4 - Full thickness tissue loss with exposed bone, tendon or muscle. (Active)  11/24/22 2130  Location: Coccyx  Location Orientation: Medial  Staging: Stage 4 - Full thickness tissue loss with exposed bone, tendon or muscle.  Wound Description (Comments):   Present on Admission: Yes     Pressure Injury 11/25/22 Heel Right Unstageable - Full thickness tissue loss in which the base of the injury is covered by slough (yellow, tan, gray, green or brown) and/or eschar (tan, brown or black) in the wound bed. Eschar (Active)  11/25/22 0800  Location: Heel  Location Orientation: Right  Staging: Unstageable - Full thickness tissue loss in which the base of the injury is covered by slough (yellow, tan, gray, green or brown) and/or eschar (tan, brown or black) in the wound bed.  Wound Description (Comments): Eschar  Present on Admission: Yes    Goals of care Spoke with the patient's daughter on the phone and updated her about the clinical condition of the patient.  Discussed about goals of care with her.     DVT prophylaxis: SCDs Start: 11/24/22 2126   Code Status:     Code Status:  Full Code  Disposition: Likely to long-term care.  Status is: Inpatient  Remains inpatient appropriate because: Multiple comorbidities, severe PEM, severe anemia,, bacteremia,   Family Communication:  Spoke with the patient's daughter on the phone and updated her about the clinical condition of the patient.  Consultants:  PCCM Palliative care  Procedures:  Urethral catheter   Antimicrobials:  None currently.  Anti-infectives (From admission, onward)    Start     Dose/Rate Route Frequency Ordered Stop   11/25/22 1800  ceFEPIme (MAXIPIME) 2 g in sodium chloride 0.9 % 100 mL IVPB  Status:  Discontinued        2 g 200 mL/hr over 30 Minutes Intravenous Every 24 hours 11/24/22 1645 11/25/22 1037   11/25/22 1800  ceFEPIme (MAXIPIME) 1 g in sodium chloride 0.9 % 100 mL IVPB  Status:  Discontinued        1 g 200 mL/hr over 30 Minutes Intravenous Every 24 hours 11/25/22 1037 11/25/22 1502   11/25/22 1600  cefTRIAXone (ROCEPHIN) 2 g in sodium chloride 0.9 % 100 mL IVPB        2 g 200 mL/hr over 30 Minutes Intravenous Every 24 hours 11/25/22 1502     11/25/22 0600  metroNIDAZOLE (FLAGYL) IVPB 500 mg  Status:  Discontinued        500 mg 100 mL/hr over 60 Minutes Intravenous Every 12 hours 11/24/22 2136 11/25/22  1610   11/24/22 1646  vancomycin variable dose per unstable renal function (pharmacist dosing)  Status:  Discontinued         Does not apply See admin instructions 11/24/22 1646 11/25/22 0942   11/24/22 1630  vancomycin (VANCOREADY) IVPB 1500 mg/300 mL        1,500 mg 150 mL/hr over 120 Minutes Intravenous  Once 11/24/22 1608 11/24/22 1645   11/24/22 1615  ceFEPIme (MAXIPIME) 2 g in sodium chloride 0.9 % 100 mL IVPB        2 g 200 mL/hr over 30 Minutes Intravenous  Once 11/24/22 1600 11/24/22 1726   11/24/22 1615  metroNIDAZOLE (FLAGYL) IVPB 500 mg        500 mg 100 mL/hr over 60 Minutes Intravenous  Once 11/24/22 1600 11/24/22 1815   11/24/22 1615  vancomycin (VANCOCIN) IVPB  1000 mg/200 mL premix  Status:  Discontinued        1,000 mg 200 mL/hr over 60 Minutes Intravenous  Once 11/24/22 1600 11/24/22 1603      Subjective: Today, patient was seen and examined at bedside.  Patient denies any nausea, vomiting, fever, chills or rigor.  Feels weak.  Oriented to place.  Objective: Vitals:   11/26/22 0600 11/26/22 0711 11/26/22 0800 11/26/22 1140  BP: (!) 110/58  (!) 99/50   Pulse: 62  (!) 56   Resp: 16  15   Temp:  97.6 F (36.4 C)  97.9 F (36.6 C)  TempSrc:  Oral  Oral  SpO2: 99%  95%   Weight:      Height:        Intake/Output Summary (Last 24 hours) at 11/26/2022 1151 Last data filed at 11/26/2022 1000 Gross per 24 hour  Intake 334.99 ml  Output 1110 ml  Net -775.01 ml   Filed Weights   11/24/22 2202 11/25/22 0329 11/26/22 0500  Weight: 54.8 kg 54.8 kg 58.4 kg    Physical Examination: Body mass index is 20.16 kg/m.   General:  Average built, not in obvious distress, elderly male, hard of hearing. HENT:   No scleral pallor or icterus noted. Oral mucosa is moist.  Chest:  Clear breath sounds. No crackles or wheezes.  CVS: S1 &S2 heard. No murmur.  Irregular rhythm. Abdomen: Soft, nontender, nondistended.  Bowel sounds are heard.   Extremities: No cyanosis, clubbing with lower extremity edema.  Peripheral pulses are palpable.  Left great to dark area, fifth left toe amputated. Psych: Alert, awake and oriented to place.  Otherwise disoriented.  Forgetful. CNS:  No cranial nerve deficits.  Generalized weakness noted. Skin: Warm and dry.  No rashes noted.  Data Reviewed:   CBC: Recent Labs  Lab 11/24/22 1553 11/24/22 1801 11/24/22 2240 11/25/22 0848 11/26/22 0853  WBC 39.0*  --   --  39.0* 21.4*  NEUTROABS 35.5*  --   --   --   --   HGB 6.0* 4.5* 8.6* 9.0* 8.1*  HCT 18.6* 14.5* 26.5* 26.0* 24.6*  MCV 108.1*  --   --  94.9 96.5  PLT 398  --   --  313 292    Basic Metabolic Panel: Recent Labs  Lab 11/24/22 1553 11/24/22 2147  11/25/22 0848 11/25/22 1317 11/26/22 0853  NA 133* 133* 134* 134* 134*  K 4.1 4.9 6.3* 4.2 3.6  CL 98 100 101 97* 99  CO2 22 18* 19* 24 25  GLUCOSE 84 95 97 121* 81  BUN 67* 60* 70* 64* 67*  CREATININE  3.94* 3.67* 3.75* 3.74* 3.32*  CALCIUM 7.8* 7.8* 7.5* 7.8* 7.7*  MG  --  1.8 1.9  --  1.8  PHOS  --  4.5 5.1*  --   --     Liver Function Tests: Recent Labs  Lab 11/24/22 1553 11/24/22 2147  AST 22 34  ALT 15 17  ALKPHOS 102 95  BILITOT 1.1 1.5*  PROT 5.5* 5.0*  ALBUMIN 1.9* 1.8*     Radiology Studies: DG Chest Port 1 View  Result Date: 11/24/2022 CLINICAL DATA:  Sepsis EXAM: PORTABLE CHEST 1 VIEW COMPARISON:  08/24/2022 x-ray and CT angiogram FINDINGS: The heart size and mediastinal contours are within normal limits. Film is rotated to the right. Calcified aorta. No consolidation, pneumothorax or effusion. No edema. Overlapping cardiac leads. The visualized skeletal structures are unremarkable. IMPRESSION: Rotated radiograph.  No acute cardiopulmonary disease. Electronically Signed   By: Karen Kays M.D.   On: 11/24/2022 16:27      LOS: 2 days     Joycelyn Das, MD [facility] Available via Epic secure chat 7am-7pm After these hours, please refer to coverage provider listed on [email or web address] 11/26/2022, 11:51 AM

## 2022-11-27 DIAGNOSIS — N179 Acute kidney failure, unspecified: Secondary | ICD-10-CM | POA: Diagnosis not present

## 2022-11-27 DIAGNOSIS — Z7189 Other specified counseling: Secondary | ICD-10-CM | POA: Diagnosis not present

## 2022-11-27 DIAGNOSIS — R6521 Severe sepsis with septic shock: Secondary | ICD-10-CM | POA: Diagnosis not present

## 2022-11-27 DIAGNOSIS — A419 Sepsis, unspecified organism: Secondary | ICD-10-CM | POA: Diagnosis not present

## 2022-11-27 DIAGNOSIS — E43 Unspecified severe protein-calorie malnutrition: Secondary | ICD-10-CM | POA: Diagnosis not present

## 2022-11-27 DIAGNOSIS — Z515 Encounter for palliative care: Secondary | ICD-10-CM | POA: Diagnosis not present

## 2022-11-27 NOTE — Progress Notes (Signed)
Pt arrived from [place] to [address] [place] at approximately 1850 today. Pt in NAD at this time, and is being fed dinner by charge RN Hessie Diener.

## 2022-11-27 NOTE — Progress Notes (Signed)
Daily Progress Note   Patient Name: Jesse NEALY Sr.       Date: 11/27/2022 DOB: 1928/06/05  Age: 89 y.o. MRN#: [id number] Attending Physician: Joycelyn Das, MD Primary Care Physician: Elfredia Nevins, MD Admit Date: 11/24/2022  Reason for Consultation/Follow-up: Establishing goals of care  Subjective: Wants to eat, denies pain, frustrated with limited mobility  Length of Stay: 3  Current Medications: Scheduled Meds:   Chlorhexidine Gluconate Cloth  6 each Topical Q0600   feeding supplement  237 mL Oral BID BM   leptospermum manuka honey  1 Application Topical Daily   multivitamin with minerals  1 tablet Oral Daily   nutrition supplement (JUVEN)  1 packet Oral BID BM   pantoprazole (PROTONIX) IV  40 mg Intravenous Q12H    Continuous Infusions:  sodium chloride Stopped (11/26/22 2149)   cefTRIAXone (ROCEPHIN)  IV 200 mL/hr at 11/27/22 0000    PRN Meds: docusate sodium, ondansetron (ZOFRAN) IV, mouth rinse, polyethylene glycol  Physical Exam Constitutional:      General: He is not in acute distress.    Appearance: He is ill-appearing.  Pulmonary:     Effort: Pulmonary effort is normal.  Skin:    General: Skin is warm and dry.  Neurological:     Comments: Oriented but unable to participate in decision making - easily confused and forgetful             Vital Signs: BP 127/65   Pulse (!) 58   Temp (!) 97.5 F (36.4 C) (Oral)   Resp 15   Ht 5\' 7"  (1.702 m)   Wt 65.8 kg   SpO2 99%   BMI 22.72 kg/m  SpO2: SpO2: 99 % O2 Device: O2 Device: Room Air O2 Flow Rate:    Intake/output summary:  Intake/Output Summary (Last 24 hours) at 11/27/2022 1300 Last data filed at 11/27/2022 1200 Gross per 24 hour  Intake 626.74 ml  Output 1060 ml  Net -433.26 ml   LBM: Last BM Date :  11/25/22 Baseline Weight: Weight: 85.3 kg Most recent weight: Weight: 65.8 kg       Palliative Assessment/Data: PPS 30%      Patient Active Problem List   Diagnosis Date Noted   Protein-calorie malnutrition, severe 11/25/2022   Sepsis (HCC) 11/24/2022   Intensive care (ICU) myopathy 10/29/2022   Amputation of fifth toe of right foot (HCC) 10/29/2022   Leg edema 09/23/2022   Hyponatremia 09/23/2022   Azotemia 09/22/2022   Prostate cancer (HCC) 09/22/2022   Chronic anticoagulation 09/17/2022   Anemia 09/16/2022   Constipation 09/16/2022   Debility 09/10/2022   PAD (peripheral artery disease) (HCC) 09/01/2022   Osteomyelitis of fifth toe of right foot (HCC) 08/31/2022   Bacteremia 08/31/2022   Acute cystitis without hematuria 08/31/2022   Polymicrobial bacterial infection 08/26/2022   Fever of undetermined origin 08/25/2022   Lactic acidosis 08/25/2022   Generalized weakness 08/25/2022   Bronchitis 08/25/2022   Bilateral pleural effusion 08/25/2022   Right foot ulcer (HCC) 08/25/2022   Essential hypertension 08/25/2022   BPH (benign prostatic hyperplasia) 08/25/2022   Atrial fibrillation (HCC) 08/25/2022    Palliative Care Assessment & Plan   HPI: 89 y.o. male  with past medical history of HTN, atrial fibrillation with chronic anticoagulation, PAD, osteomyelitis, amputation of fifth right toe, BPH, and prostate cancer admitted to [facility] on 11/24/2022 from [facility] with AMS, UTI, sepsis, and anemia (Hgb 4.5, Hct 14.5). PMT consulted to discuss GOC.   Assessment: Patient more alert and interactive during my visit with him today. No complaints.  Spoke with daughter via telephone. Update provided - reviewed clinical condition.  Goals remain the same: Ultimate goal for patient and daughter is for patient to return home. Daughter needs time to work this out as she is worried about having enough support to care for him well and also needs to investigate his  financial situation. She shares she is hopeful he can return to [facility] following discharge with the ultimate goal of returning home once she is able to arrange care at home. Considering home health vs hospice support at home. Already connected to outpatient palliative ([facility]) per daughter's report who can follow up with him at [facility].  She tells me she continues to consider code status - she agrees DNR/DNI is appropriate. She tells me she spoke with one brother and he agreed as well. She would like time to speak with one other brother prior to confirming decision to change code status.    Recommendations/Plan: Windell Moulding plans for patient to dc to [facility] to allow her more time to arrange care at home - option of hospice discussed, she is already connected to [facility] palliative team outpatient Discussed concern of ongoing decline, Windell Moulding is aware Recommended DNR/DNI - Windell Moulding reaching out to other family members and will call back if decision is reached Clinical update provided PMT to check in Monday  Code Status: Full code  Discharge Planning: Skilled Nursing Facility for rehab with Palliative care service follow-up  Care plan was discussed with patient's daughter, RN  Thank you for allowing the Palliative Medicine Team to assist in the care of this patient.  *Please note that this is a verbal dictation therefore any spelling or grammatical errors are due to the "[employer]agon [employer] One" system interpretation.  Gerlean Ren, DNP, [facility] Palliative Medicine Team Team Phone # [phone]  Pager [phone]

## 2022-11-27 NOTE — Progress Notes (Signed)
PROGRESS NOTE    [name]  ZOX:[id number] DOB: [birth year] DOA: 11/24/2022 PCP: Elfredia Nevins, MD    Brief Narrative:   89 year old man male with past medical history of hypertension, paroxysmal atrial fibrillation on Eliquis, peripheral arterial disease status post right fifth toe amputation, chronic indwelling Foley catheter, BPH, prostate cancer who was recently admitted hospital in May 2024 for critical limb ischemia of the right lower extremity with osteomyelitis requiring ray amputation was discharged to inpatient rehab between 09/10/2022 to 09/24/2022 and then subsequently to skilled nursing facility.  He has been having general functional decline since the beginning of 2024.  At this time, patient presented from skilled nursing with altered mental status with several episodes of nausea and vomiting.    In the ED, patient was hypotensive.  Initial hemoglobin was 6.0 from baseline 8-10, WBC was elevated at 39K, INR was elevated at 5.2.  He had mild hyponatremia with sodium of 133 and creatinine was elevated at 3.9, albumin low at 1.9.  LFTs were within normal limits.  Had elevated lactic acid and urine showed large leukocytes but nitrites were negative.  Patient was given broad-spectrum antibiotics and was admitted hospital for further evaluation and treatment.  Patient was also given 2 units of packed RBC for low hemoglobin.  Patient was initially admitted to the ICU for concerns of sepsis and or bleed.    Assessment and plan  Undifferentiated shock likely septic from GI with Proteus bacteremia Lactic acidosis Was febrile and hypotensive with elevated procalcitonin at 54.  Required vasopressors during ICU stay.  Patient was initially on vancomycin and cefepime.  This has been changed to Rocephin at this time.  Blood pressure stable at this time.  Acute kidney injury.  Creatinine today at 3.0 from 3.7< 3.64.  Baseline creatinine around 0.9.  On Foley catheter.  Continue antibiotic.   Continue strict intake and output charting, daily weights.  Monitor BMP.  Anemia in the setting of gross hematuria, Elevated INR Had received 2 units of packed RBC at outside hospital for hemoglobin of 4.5 and INR of 5.4.  Was on Eliquis at home.  Latest hemoglobin 8.8. Liver function test within normal limits.   FOBT was positive but DRE was negative for blood.  Continue PPI.  INR downtrending status post vitamin K.  Check CBC and INR in AM.  Chronic indwelling Foley catheter History of UTI Sepsis secondary to UTI. Prostate CA Follows up with Dr. Retta Diones urology as outpatient.  Has progressive prostate cancer.  Bone imaging was  negative on 2023.  Not on Flomax.  Currently on Foley catheter.  Continue to monitor closely.  Paroxysmal Afib/ Lower extremity edema. -Followed as outpatient by Dr. Rosemary Holms, last seen 7/22. CHA2DS2-VASc Score 5.  Continue telemetry.  Anticoagulation on hold due to elevated INR.  2D echocardiogram with LV ejection fraction of 65 to 70% with LVH.  Check INR in AM.  PAD/Critical limb ischemia of RLE requiring fifth partial ray amputation History of osteomyelitis Patient had underwent abdominal aortogram with runoff 5/16 with [facility] Karin Lieu) with recanalization and ballon angioplasty of tibioperoneal trunk. Ultimately required R fifth partial ray amputation 5/22 with Ortho Lajoyce Corners).  Low suspicion for recurrent infection. Blood cultures showing  Proteus species.  On Rocephin at this time.   Physical deconditioning -Discharged to inpatient rehab (5/24 - 6/7) then to SNF. General functional decline since the beginning of 2024.  Will likely benefit from long-term care. PT OT recommends long-term care.  Severe protein calorie malnutrition.  Hypoalbuminemia Nutrition Status: Nutrition Problem: Severe Malnutrition Etiology: chronic illness Signs/Symptoms: severe fat depletion, severe muscle depletion, percent weight loss (38% weight loss in 2 months) Percent weight loss:  38 % Interventions: Ensure Enlive (each supplement provides 350kcal and 20 grams of protein), MVI, Magic cup, Juven  Pressure injury present on admission. Continue wound care.  Pressure Injury 08/25/22 Heel Left Deep Tissue Pressure Injury - Purple or maroon localized area of discolored intact skin or blood-filled blister due to damage of underlying soft tissue from pressure and/or shear. 6.5cm x 5.5cm (Active)  08/25/22 0730  Location: Heel  Location Orientation: Left  Staging: Deep Tissue Pressure Injury - Purple or maroon localized area of discolored intact skin or blood-filled blister due to damage of underlying soft tissue from pressure and/or shear.  Wound Description (Comments): 6.5cm x 5.5cm  Present on Admission: Yes     Pressure Injury 08/25/22 Buttocks Right Stage 2 -  Partial thickness loss of dermis presenting as a shallow open injury with a red, pink wound bed without slough. (Active)  08/25/22   Location: Buttocks  Location Orientation: Right  Staging: Stage 2 -  Partial thickness loss of dermis presenting as a shallow open injury with a red, pink wound bed without slough.  Wound Description (Comments):   Present on Admission: Yes     Pressure Injury 11/24/22 Coccyx Medial Stage 4 - Full thickness tissue loss with exposed bone, tendon or muscle. (Active)  11/24/22 2130  Location: Coccyx  Location Orientation: Medial  Staging: Stage 4 - Full thickness tissue loss with exposed bone, tendon or muscle.  Wound Description (Comments):   Present on Admission: Yes     Pressure Injury 11/25/22 Heel Right Unstageable - Full thickness tissue loss in which the base of the injury is covered by slough (yellow, tan, gray, green or brown) and/or eschar (tan, brown or black) in the wound bed. Eschar (Active)  11/25/22 0800  Location: Heel  Location Orientation: Right  Staging: Unstageable - Full thickness tissue loss in which the base of the injury is covered by slough (yellow, tan,  gray, green or brown) and/or eschar (tan, brown or black) in the wound bed.  Wound Description (Comments): Eschar  Present on Admission: Yes    Goals of care Spoke with the patient's daughter on the phone on 11/26/2022.  Overall prognosis is guarded.  Patient was seen by palliative care team and plan is to proceed with full code and full scope of care.    DVT prophylaxis: SCDs Start: 11/24/22 2126   Code Status:     Code Status: Full Code  Disposition: Likely to long-term care.  Status is: Inpatient  Remains inpatient appropriate because: Multiple comorbidities, severe PEM, severe anemia,, Proteus bacteremia, need for long-term care care placement.   Family Communication:  Spoke with the patient's daughter on the phone and updated her about the clinical condition of the patient on 11/26/2022.  Consultants:  PCCM Palliative care  Procedures:  Urethral catheter   Antimicrobials:  None currently.  Anti-infectives (From admission, onward)    Start     Dose/Rate Route Frequency Ordered Stop   11/25/22 1800  ceFEPIme (MAXIPIME) 2 g in sodium chloride 0.9 % 100 mL IVPB  Status:  Discontinued        2 g 200 mL/hr over 30 Minutes Intravenous Every 24 hours 11/24/22 1645 11/25/22 1037   11/25/22 1800  ceFEPIme (MAXIPIME) 1 g in sodium chloride 0.9 % 100 mL IVPB  Status:  Discontinued  1 g 200 mL/hr over 30 Minutes Intravenous Every 24 hours 11/25/22 1037 11/25/22 1502   11/25/22 1600  cefTRIAXone (ROCEPHIN) 2 g in sodium chloride 0.9 % 100 mL IVPB        2 g 200 mL/hr over 30 Minutes Intravenous Every 24 hours 11/25/22 1502     11/25/22 0600  metroNIDAZOLE (FLAGYL) IVPB 500 mg  Status:  Discontinued        500 mg 100 mL/hr over 60 Minutes Intravenous Every 12 hours 11/24/22 2136 11/25/22 0942   11/24/22 1646  vancomycin variable dose per unstable renal function (pharmacist dosing)  Status:  Discontinued         Does not apply See admin instructions 11/24/22 1646 11/25/22 0942    11/24/22 1630  vancomycin (VANCOREADY) IVPB 1500 mg/300 mL        1,500 mg 150 mL/hr over 120 Minutes Intravenous  Once 11/24/22 1608 11/24/22 1645   11/24/22 1615  ceFEPIme (MAXIPIME) 2 g in sodium chloride 0.9 % 100 mL IVPB        2 g 200 mL/hr over 30 Minutes Intravenous  Once 11/24/22 1600 11/24/22 1726   11/24/22 1615  metroNIDAZOLE (FLAGYL) IVPB 500 mg        500 mg 100 mL/hr over 60 Minutes Intravenous  Once 11/24/22 1600 11/24/22 1815   11/24/22 1615  vancomycin (VANCOCIN) IVPB 1000 mg/200 mL premix  Status:  Discontinued        1,000 mg 200 mL/hr over 60 Minutes Intravenous  Once 11/24/22 1600 11/24/22 1603      Subjective: Today, patient was seen and examined at bedside.  Denies any pain, nausea, vomiting, fever, chills or rigor.  Feels a little lousy.    Objective: Vitals:   11/27/22 0800 11/27/22 0900 11/27/22 1000 11/27/22 1108  BP: (!) 133/59 (!) 118/57 127/65   Pulse: 65 (!) 57 (!) 58   Resp: 20 (!) 21 15   Temp:    (!) 97.5 F (36.4 C)  TempSrc:    Oral  SpO2: 98% 97% 99%   Weight:      Height:        Intake/Output Summary (Last 24 hours) at 11/27/2022 1453 Last data filed at 11/27/2022 1200 Gross per 24 hour  Intake 616.74 ml  Output 960 ml  Net -343.26 ml   Filed Weights   11/25/22 0329 11/26/22 0500 11/27/22 0500  Weight: 54.8 kg 58.4 kg 65.8 kg    Physical Examination: Body mass index is 22.72 kg/m.   General:  Average built, not in obvious distress, elderly male, hard of hearing, HENT:   No scleral pallor or icterus noted. Oral mucosa is moist.  Chest:  Clear breath sounds. No crackles or wheezes.  CVS: S1 &S2 heard. No murmur.  Irregular rhythm. Abdomen: Soft, nontender, nondistended.  Bowel sounds are heard.   Extremities: No cyanosis, clubbing with lower extremity edema.  Peripheral pulses are palpable.  Left great toe dark discoloration,, fifth left toe amputated. Psych: Alert, awake and oriented to place.  Forgetful. CNS:  No cranial  nerve deficits.  Generalized weakness noted. Skin: Warm and dry.    Data Reviewed:   CBC: Recent Labs  Lab 11/24/22 1553 11/24/22 1801 11/24/22 2240 11/25/22 0848 11/26/22 0853 11/27/22 0057  WBC 39.0*  --   --  39.0* 21.4* 16.0*  NEUTROABS 35.5*  --   --   --   --   --   HGB 6.0* 4.5* 8.6* 9.0* 8.1* 8.8*  HCT  18.6* 14.5* 26.5* 26.0* 24.6* 26.8*  MCV 108.1*  --   --  94.9 96.5 99.6  PLT 398  --   --  313 292 271    Basic Metabolic Panel: Recent Labs  Lab 11/24/22 2147 11/25/22 0848 11/25/22 1317 11/26/22 0853 11/27/22 0057  NA 133* 134* 134* 134* 134*  K 4.9 6.3* 4.2 3.6 3.7  CL 100 101 97* 99 99  CO2 18* 19* 24 25 23   GLUCOSE 95 97 121* 81 118*  BUN 60* 70* 64* 67* 66*  CREATININE 3.67* 3.75* 3.74* 3.32* 3.01*  CALCIUM 7.8* 7.5* 7.8* 7.7* 7.9*  MG 1.8 1.9  --  1.8 1.8  PHOS 4.5 5.1*  --   --   --     Liver Function Tests: Recent Labs  Lab 11/24/22 1553 11/24/22 2147  AST 22 34  ALT 15 17  ALKPHOS 102 95  BILITOT 1.1 1.5*  PROT 5.5* 5.0*  ALBUMIN 1.9* 1.8*     Radiology Studies: No results found.    LOS: 3 days     Joycelyn Das, MD [facility] Available via Epic secure chat 7am-7pm After these hours, please refer to coverage provider listed on [email or web address] 11/27/2022, 2:53 PM

## 2022-11-27 NOTE — Progress Notes (Signed)
Report attempted 

## 2022-11-27 NOTE — Plan of Care (Signed)
  Problem: Clinical Measurements: Goal: Will remain free from infection Outcome: Progressing Goal: Diagnostic test results will improve Outcome: Progressing Goal: Respiratory complications will improve Outcome: Progressing Goal: Cardiovascular complication will be avoided Outcome: Progressing   Problem: Activity: Goal: Risk for activity intolerance will decrease Outcome: Progressing   Problem: Nutrition: Goal: Adequate nutrition will be maintained Outcome: Progressing   Problem: Coping: Goal: Level of anxiety will decrease Outcome: Progressing   Problem: Pain Managment: Goal: General experience of comfort will improve Outcome: Progressing   Problem: Skin Integrity: Goal: Risk for impaired skin integrity will decrease Outcome: Progressing

## 2022-11-28 DIAGNOSIS — A419 Sepsis, unspecified organism: Secondary | ICD-10-CM | POA: Diagnosis not present

## 2022-11-28 DIAGNOSIS — N179 Acute kidney failure, unspecified: Secondary | ICD-10-CM | POA: Diagnosis not present

## 2022-11-28 DIAGNOSIS — R6521 Severe sepsis with septic shock: Secondary | ICD-10-CM | POA: Diagnosis not present

## 2022-11-28 DIAGNOSIS — E43 Unspecified severe protein-calorie malnutrition: Secondary | ICD-10-CM | POA: Diagnosis not present

## 2022-11-28 LAB — BASIC METABOLIC PANEL WITH GFR
Anion gap: 10 (ref 5–15)
BUN: 74 mg/dL — ABNORMAL HIGH (ref 8–23)
CO2: 25 mmol/L (ref 22–32)
Calcium: 8.2 mg/dL — ABNORMAL LOW (ref 8.9–10.3)
Chloride: 101 mmol/L (ref 98–111)
Creatinine, Ser: 2.09 mg/dL — ABNORMAL HIGH (ref 0.61–1.24)
GFR, Estimated: 29 mL/min — ABNORMAL LOW (ref >60)
Glucose, Bld: 114 mg/dL — ABNORMAL HIGH (ref 70–99)
Potassium: 3.9 mmol/L (ref 3.5–5.1)
Sodium: 136 mmol/L (ref 135–145)

## 2022-11-28 LAB — CBC
HCT: 26 % — ABNORMAL LOW (ref 39.0–52.0)
Hemoglobin: 8.1 g/dL — ABNORMAL LOW (ref 13.0–17.0)
MCH: 30.9 pg (ref 26.0–34.0)
MCHC: 31.2 g/dL (ref 30.0–36.0)
MCV: 99.2 fL (ref 80.0–100.0)
Platelets: 267 10*3/uL (ref 150–400)
RBC: 2.62 MIL/uL — ABNORMAL LOW (ref 4.22–5.81)
RDW: 21.4 % — ABNORMAL HIGH (ref 11.5–15.5)
WBC: 10 10*3/uL (ref 4.0–10.5)
nRBC: 0 % (ref 0.0–0.2)

## 2022-11-28 LAB — GLUCOSE, CAPILLARY
Glucose-Capillary: 104 mg/dL — ABNORMAL HIGH (ref 70–99)
Glucose-Capillary: 90 mg/dL (ref 70–99)
Glucose-Capillary: 107 mg/dL — ABNORMAL HIGH (ref 70–99)

## 2022-11-28 LAB — PROTIME-INR
INR: 1.5 — ABNORMAL HIGH (ref 0.8–1.2)
Prothrombin Time: 18.3 s — ABNORMAL HIGH (ref 11.4–15.2)

## 2022-11-28 LAB — VITAMIN B12: Vitamin B-12: 1453 pg/mL — ABNORMAL HIGH (ref 180–914)

## 2022-11-28 MED ORDER — POLYETHYLENE GLYCOL 3350 17 G PO PACK
34.0000 g | PACK | Freq: Once | ORAL | Status: AC
  Administered 2022-11-28: 34 g via ORAL
  Filled 2022-11-28: qty 2

## 2022-11-28 NOTE — Plan of Care (Signed)
  Problem: Nutrition: Goal: Adequate nutrition will be maintained 11/28/2022 0417 by Georgia Duff, RN Outcome: Progressing 11/28/2022 0417 by Georgia Duff, RN Outcome: Progressing   Problem: Elimination: Goal: Will not experience complications related to bowel motility 11/28/2022 0417 by Georgia Duff, RN Outcome: Progressing 11/28/2022 0417 by Georgia Duff, RN Outcome: Progressing   Problem: Safety: Goal: Ability to remain free from injury will improve 11/28/2022 0417 by Georgia Duff, RN Outcome: Progressing 11/28/2022 0417 by Georgia Duff, RN Outcome: Progressing   Problem: Skin Integrity: Goal: Risk for impaired skin integrity will decrease 11/28/2022 0417 by Georgia Duff, RN Outcome: Progressing 11/28/2022 0417 by Georgia Duff, RN Outcome: Progressing

## 2022-11-28 NOTE — Progress Notes (Signed)
PROGRESS NOTE    [name]  ZOX:[id number] DOB: 01/26/1929 DOA: 11/24/2022 PCP: Elfredia Nevins, MD    Brief Narrative:   89 year old man male with past medical history of hypertension, paroxysmal atrial fibrillation on Eliquis, peripheral arterial disease status post right fifth toe amputation, chronic indwelling Foley catheter, BPH, prostate cancer who was recently admitted hospital in May 2024 for critical limb ischemia of the right lower extremity with osteomyelitis requiring ray amputation was discharged to inpatient rehab between 09/10/2022 to 09/24/2022 and then subsequently to skilled nursing facility.  He has been having general functional decline since the beginning of 2024.  At this time, patient presented from skilled nursing with altered mental status with several episodes of nausea and vomiting.    In the ED, patient was hypotensive.  Initial hemoglobin was 6.0 from baseline 8-10, WBC was elevated at 39K, INR was elevated at 5.2.  He had mild hyponatremia with sodium of 133 and creatinine was elevated at 3.9, albumin low at 1.9.  LFTs were within normal limits.  Had elevated lactic acid and urine showed large leukocytes but nitrites were negative.  Patient was given broad-spectrum antibiotics and was admitted hospital for further evaluation and treatment.  Patient was also given 2 units of packed RBC for low hemoglobin.  Patient was initially admitted to the ICU for concerns of sepsis and or bleed.    Assessment and plan  Septic shock  Proteus bacteremia/Foley related UTI Present on admission - Was febrile and hypotensive, leukocytosis, coagulopathy with elevated procalcitonin at 54.  Required vasopressors during ICU stay.  Patient was initially on vancomycin and cefepime.   -Pathophysiology of septic shock has resolved -blood culture and urine culture growing Proteus, continue with IV Rocephin  - Blood pressure stable at this time.  Acute kidney injury.  -  Baseline creatinine  around 0.9.  3.9 on admission, proving, it is at 2.09 today.   Acute blood loss anemia in the setting of gross hematuria, Elevated INR/coagulopathy Had received 2 units of packed RBC at outside hospital for hemoglobin of 4.5 and INR of 5.4. -On Eliquis at home, remains on hold. - FOBT was positive but DRE was negative for blood.  No evidence of significant GI bleed during hospital stay, last BM 2 days ago with no melena, continue with PPI  Coagulopathy -likely due to sepsis, as well level is unreliable as on Eliquis, but much improved with vitamin K.  Chronic indwelling Foley catheter History of UTI Sepsis secondary to UTI. Prostate CA Follows up with Dr. Retta Diones urology as outpatient.  Has progressive prostate cancer.  Bone imaging was  negative on 2023.  Not on Flomax.  Currently on Foley catheter.  Continue to monitor closely.  Paroxysmal Afib/ Lower extremity edema. -Followed as outpatient by Dr. Rosemary Holms, last seen 7/22. CHA2DS2-VASc Score 5.  Continue telemetry.   -  2D echocardiogram with LV ejection fraction of 65 to 70% with LVH.  -Continue to hold Eliquis in the setting of acute blood loss anemia due to hematuria, this appears to be improving, as well coagulopathy is improving.  PAD/Critical limb ischemia of RLE requiring fifth partial ray amputation History of osteomyelitis Patient had underwent abdominal aortogram with runoff 5/16 with [facility] Karin Lieu) with recanalization and ballon angioplasty of tibioperoneal trunk. Ultimately required R fifth partial ray amputation 5/22 with Ortho Lajoyce Corners).   -No evidence of infection on clinical exam, bacteremia related to UTI.  Physical deconditioning -Discharged to inpatient rehab (5/24 - 6/7) then to SNF.  General functional decline since the beginning of 2024.  Will likely benefit from long-term care. PT OT recommends long-term care.  Severe protein calorie malnutrition. Hypoalbuminemia Nutrition Status: Nutrition Problem: Severe  Malnutrition Etiology: chronic illness Signs/Symptoms: severe fat depletion, severe muscle depletion, percent weight loss (38% weight loss in 2 months) Percent weight loss: 38 % Interventions: Ensure Enlive (each supplement provides 350kcal and 20 grams of protein), MVI, Magic cup, Juven  Pressure injury present on admission. Continue wound care.  Pressure Injury 08/25/22 Heel Left Deep Tissue Pressure Injury - Purple or maroon localized area of discolored intact skin or blood-filled blister due to damage of underlying soft tissue from pressure and/or shear. 6.5cm x 5.5cm (Active)  08/25/22 0730  Location: Heel  Location Orientation: Left  Staging: Deep Tissue Pressure Injury - Purple or maroon localized area of discolored intact skin or blood-filled blister due to damage of underlying soft tissue from pressure and/or shear.  Wound Description (Comments): 6.5cm x 5.5cm  Present on Admission: Yes     Pressure Injury 08/25/22 Buttocks Right Stage 2 -  Partial thickness loss of dermis presenting as a shallow open injury with a red, pink wound bed without slough. (Active)  08/25/22   Location: Buttocks  Location Orientation: Right  Staging: Stage 2 -  Partial thickness loss of dermis presenting as a shallow open injury with a red, pink wound bed without slough.  Wound Description (Comments):   Present on Admission: Yes     Pressure Injury 11/24/22 Coccyx Medial Stage 4 - Full thickness tissue loss with exposed bone, tendon or muscle. (Active)  11/24/22 2130  Location: Coccyx  Location Orientation: Medial  Staging: Stage 4 - Full thickness tissue loss with exposed bone, tendon or muscle.  Wound Description (Comments):   Present on Admission: Yes     Pressure Injury 11/25/22 Heel Right Unstageable - Full thickness tissue loss in which the base of the injury is covered by slough (yellow, tan, gray, green or brown) and/or eschar (tan, brown or black) in the wound bed. Eschar (Active)   11/25/22 0800  Location: Heel  Location Orientation: Right  Staging: Unstageable - Full thickness tissue loss in which the base of the injury is covered by slough (yellow, tan, gray, green or brown) and/or eschar (tan, brown or black) in the wound bed.  Wound Description (Comments): Eschar  Present on Admission: Yes    Goals of care Palliative medicine is following.    DVT prophylaxis: SCDs Start: 11/24/22 2126   Code Status:     Code Status: Full Code  Disposition: Likely to long-term care.  Status is: Inpatient  Remains inpatient appropriate because: Multiple comorbidities, severe PEM, severe anemia,, Proteus bacteremia, need for long-term care care placement.   Family Communication:  Spoke with the patient's daughter on the phone and updated her about the clinical condition of the patient on 11/26/2022.  Consultants:  PCCM Palliative care  Procedures:  Urethral catheter   Antimicrobials:  None currently.  Anti-infectives (From admission, onward)    Start     Dose/Rate Route Frequency Ordered Stop   11/25/22 1800  ceFEPIme (MAXIPIME) 2 g in sodium chloride 0.9 % 100 mL IVPB  Status:  Discontinued        2 g 200 mL/hr over 30 Minutes Intravenous Every 24 hours 11/24/22 1645 11/25/22 1037   11/25/22 1800  ceFEPIme (MAXIPIME) 1 g in sodium chloride 0.9 % 100 mL IVPB  Status:  Discontinued        1  g 200 mL/hr over 30 Minutes Intravenous Every 24 hours 11/25/22 1037 11/25/22 1502   11/25/22 1600  cefTRIAXone (ROCEPHIN) 2 g in sodium chloride 0.9 % 100 mL IVPB        2 g 200 mL/hr over 30 Minutes Intravenous Every 24 hours 11/25/22 1502     11/25/22 0600  metroNIDAZOLE (FLAGYL) IVPB 500 mg  Status:  Discontinued        500 mg 100 mL/hr over 60 Minutes Intravenous Every 12 hours 11/24/22 2136 11/25/22 0942   11/24/22 1646  vancomycin variable dose per unstable renal function (pharmacist dosing)  Status:  Discontinued         Does not apply See admin instructions  11/24/22 1646 11/25/22 0942   11/24/22 1630  vancomycin (VANCOREADY) IVPB 1500 mg/300 mL        1,500 mg 150 mL/hr over 120 Minutes Intravenous  Once 11/24/22 1608 11/24/22 1645   11/24/22 1615  ceFEPIme (MAXIPIME) 2 g in sodium chloride 0.9 % 100 mL IVPB        2 g 200 mL/hr over 30 Minutes Intravenous  Once 11/24/22 1600 11/24/22 1726   11/24/22 1615  metroNIDAZOLE (FLAGYL) IVPB 500 mg        500 mg 100 mL/hr over 60 Minutes Intravenous  Once 11/24/22 1600 11/24/22 1815   11/24/22 1615  vancomycin (VANCOCIN) IVPB 1000 mg/200 mL premix  Status:  Discontinued        1,000 mg 200 mL/hr over 60 Minutes Intravenous  Once 11/24/22 1600 11/24/22 1603      Subjective: Denies nausea, vomiting or chills  Objective: Vitals:   11/28/22 0454 11/28/22 0456 11/28/22 0800 11/28/22 0906  BP: (!) 141/86  (!) 129/58 (!) 120/57  Pulse: 68  67 62  Resp: (!) 22  16 (!) 21  Temp: 97.8 F (36.6 C)  97.9 F (36.6 C)   TempSrc: Oral  Oral   SpO2: 100%  98%   Weight:  66 kg    Height:        Intake/Output Summary (Last 24 hours) at 11/28/2022 1204 Last data filed at 11/28/2022 0900 Gross per 24 hour  Intake 200 ml  Output 1235 ml  Net -1035 ml   Filed Weights   11/26/22 0500 11/27/22 0500 11/28/22 0456  Weight: 58.4 kg 65.8 kg 66 kg    Physical Examination: Body mass index is 22.79 kg/m.   Awake Alert, Oriented X 3, forgetful patient, frail, deconditioned Symmetrical Chest wall movement, Good air movement bilaterally, CTAB RRR,No Gallops,Rubs or new Murmurs, No Parasternal Heave +ve B.Sounds, Abd Soft, No tenderness, No rebound - guarding or rigidity. No Cyanosis, Clubbing or edema, No new Rash or bruise         Data Reviewed:   CBC: Recent Labs  Lab 11/24/22 1553 11/24/22 1801 11/24/22 2240 11/25/22 0848 11/26/22 0853 11/27/22 0057 11/28/22 0159  WBC 39.0*  --   --  39.0* 21.4* 16.0* 10.0  NEUTROABS 35.5*  --   --   --   --   --   --   HGB 6.0*   < > 8.6* 9.0* 8.1*  8.8* 8.1*  HCT 18.6*   < > 26.5* 26.0* 24.6* 26.8* 26.0*  MCV 108.1*  --   --  94.9 96.5 99.6 99.2  PLT 398  --   --  313 292 271 267   < > = values in this interval not displayed.    Basic Metabolic Panel: Recent Labs  Lab 11/24/22  2147 11/25/22 0848 11/25/22 1317 11/26/22 0853 11/27/22 0057 11/28/22 0159  NA 133* 134* 134* 134* 134* 136  K 4.9 6.3* 4.2 3.6 3.7 3.9  CL 100 101 97* 99 99 101  CO2 18* 19* 24 25 23 25   GLUCOSE 95 97 121* 81 118* 114*  BUN 60* 70* 64* 67* 66* 74*  CREATININE 3.67* 3.75* 3.74* 3.32* 3.01* 2.09*  CALCIUM 7.8* 7.5* 7.8* 7.7* 7.9* 8.2*  MG 1.8 1.9  --  1.8 1.8  --   PHOS 4.5 5.1*  --   --   --   --     Liver Function Tests: Recent Labs  Lab 11/24/22 1553 11/24/22 2147  AST 22 34  ALT 15 17  ALKPHOS 102 95  BILITOT 1.1 1.5*  PROT 5.5* 5.0*  ALBUMIN 1.9* 1.8*     Radiology Studies: No results found.    LOS: 4 days     Huey Bienenstock, MD [facility] Available via Epic secure chat 7am-7pm After these hours, please refer to coverage provider listed on [email or web address] 11/28/2022, 12:04 PM

## 2022-11-29 DIAGNOSIS — N179 Acute kidney failure, unspecified: Secondary | ICD-10-CM | POA: Diagnosis not present

## 2022-11-29 DIAGNOSIS — R6521 Severe sepsis with septic shock: Secondary | ICD-10-CM | POA: Diagnosis not present

## 2022-11-29 DIAGNOSIS — A419 Sepsis, unspecified organism: Secondary | ICD-10-CM | POA: Diagnosis not present

## 2022-11-29 DIAGNOSIS — Z515 Encounter for palliative care: Secondary | ICD-10-CM | POA: Diagnosis not present

## 2022-11-29 DIAGNOSIS — Z7189 Other specified counseling: Secondary | ICD-10-CM | POA: Diagnosis not present

## 2022-11-29 LAB — CBC
HCT: 28.2 % — ABNORMAL LOW (ref 39.0–52.0)
Hemoglobin: 8.8 g/dL — ABNORMAL LOW (ref 13.0–17.0)
MCH: 32.1 pg (ref 26.0–34.0)
MCHC: 31.2 g/dL (ref 30.0–36.0)
MCV: 102.9 fL — ABNORMAL HIGH (ref 80.0–100.0)
Platelets: 261 10*3/uL (ref 150–400)
RBC: 2.74 MIL/uL — ABNORMAL LOW (ref 4.22–5.81)
RDW: 20.7 % — ABNORMAL HIGH (ref 11.5–15.5)
WBC: 11.5 10*3/uL — ABNORMAL HIGH (ref 4.0–10.5)
nRBC: 0 % (ref 0.0–0.2)

## 2022-11-29 LAB — GLUCOSE, CAPILLARY: Glucose-Capillary: 105 mg/dL — ABNORMAL HIGH (ref 70–99)

## 2022-11-29 MED ORDER — ORAL CARE MOUTH RINSE: 15.0000 mL | OROMUCOSAL | Status: DC | PRN

## 2022-11-29 MED ORDER — ORAL CARE MOUTH RINSE
15.0000 mL | OROMUCOSAL | Status: DC
  Administered 2022-11-29 – 2022-11-30 (×4): 15 mL via OROMUCOSAL

## 2022-11-29 MED ORDER — ACETAMINOPHEN 325 MG PO TABS
650.0000 mg | ORAL_TABLET | Freq: Four times a day (QID) | ORAL | Status: DC | PRN
  Administered 2022-11-29: 650 mg via ORAL
  Filled 2022-11-29: qty 2

## 2022-11-29 MED ORDER — MAGNESIUM CITRATE PO SOLN
1.0000 | Freq: Once | ORAL | Status: AC
  Administered 2022-11-29: 1 via ORAL
  Filled 2022-11-29: qty 296

## 2022-11-29 MED ORDER — CEFAZOLIN SODIUM-DEXTROSE 2-4 GM/100ML-% IV SOLN
2.0000 g | Freq: Two times a day (BID) | INTRAVENOUS | Status: DC
  Administered 2022-11-29 – 2022-11-30 (×3): 2 g via INTRAVENOUS
  Filled 2022-11-29 (×3): qty 100

## 2022-11-29 NOTE — Care Management Important Message (Signed)
Important Message  Patient Details  Name: Jesse CORIELL Sr. MRN: [id number] Date of Birth: 31-Oct-1928   Medicare Important Message Given:  Yes       11/29/2022, 3:19 PM

## 2022-11-29 NOTE — Progress Notes (Signed)
PROGRESS NOTE    [name]  QMV:[id number] DOB: Jun 26, 1928 DOA: 11/24/2022 PCP: Elfredia Nevins, MD    Brief Narrative:   89 year old man male with past medical history of hypertension, paroxysmal atrial fibrillation on Eliquis, peripheral arterial disease status post right fifth toe amputation, chronic indwelling Foley catheter, BPH, prostate cancer who was recently admitted hospital in May 2024 for critical limb ischemia of the right lower extremity with osteomyelitis requiring ray amputation was discharged to inpatient rehab between 09/10/2022 to 09/24/2022 and then subsequently to skilled nursing facility.  He has been having general functional decline since the beginning of 2024.  At this time, patient presented from skilled nursing with altered mental status with several episodes of nausea and vomiting.    In the ED, patient was hypotensive.  Initial hemoglobin was 6.0 from baseline 8-10, WBC was elevated at 39K, INR was elevated at 5.2.  He had mild hyponatremia with sodium of 133 and creatinine was elevated at 3.9, albumin low at 1.9.  LFTs were within normal limits.  Had elevated lactic acid and urine showed large leukocytes but nitrites were negative.  Patient was given broad-spectrum antibiotics and was admitted hospital for further evaluation and treatment.  Patient was also given 2 units of packed RBC for low hemoglobin.  Patient was initially admitted to the ICU for concerns of sepsis and or bleed.    Assessment and plan  Septic shock  Proteus bacteremia/Foley related UTI Present on admission - Was febrile and hypotensive, leukocytosis, coagulopathy with elevated procalcitonin at 54.  Required vasopressors during ICU stay.  Patient was initially on vancomycin and cefepime.   -Pathophysiology of septic shock has resolved -blood culture and urine culture growing Proteus, continue with IV Rocephin, will transition to PO antibiotics at time of dischagre. - Blood pressure stable at  this time.  Acute kidney injury.  -  Baseline creatinine around 0.9.  3.9 on admission, continue continues to improve, it is 2.09 today, avoid nephrotoxic medications, and low blood pressure  Acute blood loss anemia in the setting of gross hematuria, Elevated INR/coagulopathy Had received 2 units of packed RBC at outside hospital for hemoglobin of 4.5 and INR of 5.4. -On Eliquis at home, remains on hold. - FOBT was positive but DRE was negative for blood.  No evidence of significant GI bleed during hospital stay, last BM 2 days ago with no melena, continue with PPI  Coagulopathy -likely due to sepsis, as well level is unreliable as on Eliquis, but much improved with vitamin K.  Chronic indwelling Foley catheter History of UTI Sepsis secondary to UTI. Prostate CA Follows up with Dr. Retta Diones urology as outpatient.  Has progressive prostate cancer.  Bone imaging was  negative on 2023.  Not on Flomax.  Currently on Foley catheter.  Continue to monitor closely.  Paroxysmal Afib/ Lower extremity edema. -Followed as outpatient by Dr. Rosemary Holms, last seen 7/22. CHA2DS2-VASc Score 5.  Continue telemetry.   -  2D echocardiogram with LV ejection fraction of 65 to 70% with LVH.  -Continue to hold Eliquis in the setting of acute blood loss anemia due to hematuria, this appears to be improving, as well coagulopathy is improving.   - Consider resuming Eliquis in next 24 hours if hemoglobin remained stable  PAD/Critical limb ischemia of RLE requiring fifth partial ray amputation History of osteomyelitis Patient had underwent abdominal aortogram with runoff 5/16 with [facility] Karin Lieu) with recanalization and ballon angioplasty of tibioperoneal trunk. Ultimately required R fifth partial ray amputation  5/22 with Gaylord Shih Lajoyce Corners).   -No evidence of infection on clinical exam, bacteremia related to UTI.  Physical deconditioning -Discharged to inpatient rehab (5/24 - 6/7) then to SNF. General functional decline  since the beginning of 2024.  Will likely benefit from long-term care. PT OT recommends long-term care.  Severe protein calorie malnutrition. Hypoalbuminemia Nutrition Status: Nutrition Problem: Severe Malnutrition Etiology: chronic illness Signs/Symptoms: severe fat depletion, severe muscle depletion, percent weight loss (38% weight loss in 2 months) Percent weight loss: 38 % Interventions: Ensure Enlive (each supplement provides 350kcal and 20 grams of protein), MVI, Magic cup, Juven  Pressure injury present on admission. Continue wound care.  Pressure Injury 08/25/22 Heel Left Deep Tissue Pressure Injury - Purple or maroon localized area of discolored intact skin or blood-filled blister due to damage of underlying soft tissue from pressure and/or shear. 6.5cm x 5.5cm (Active)  08/25/22 0730  Location: Heel  Location Orientation: Left  Staging: Deep Tissue Pressure Injury - Purple or maroon localized area of discolored intact skin or blood-filled blister due to damage of underlying soft tissue from pressure and/or shear.  Wound Description (Comments): 6.5cm x 5.5cm  Present on Admission: Yes     Pressure Injury 08/25/22 Buttocks Right Stage 2 -  Partial thickness loss of dermis presenting as a shallow open injury with a red, pink wound bed without slough. (Active)  08/25/22   Location: Buttocks  Location Orientation: Right  Staging: Stage 2 -  Partial thickness loss of dermis presenting as a shallow open injury with a red, pink wound bed without slough.  Wound Description (Comments):   Present on Admission: Yes     Pressure Injury 11/24/22 Coccyx Medial Stage 4 - Full thickness tissue loss with exposed bone, tendon or muscle. (Active)  11/24/22 2130  Location: Coccyx  Location Orientation: Medial  Staging: Stage 4 - Full thickness tissue loss with exposed bone, tendon or muscle.  Wound Description (Comments):   Present on Admission: Yes     Pressure Injury 11/25/22 Heel Right  Unstageable - Full thickness tissue loss in which the base of the injury is covered by slough (yellow, tan, gray, green or brown) and/or eschar (tan, brown or black) in the wound bed. Eschar (Active)  11/25/22 0800  Location: Heel  Location Orientation: Right  Staging: Unstageable - Full thickness tissue loss in which the base of the injury is covered by slough (yellow, tan, gray, green or brown) and/or eschar (tan, brown or black) in the wound bed.  Wound Description (Comments): Eschar  Present on Admission: Yes    Goals of care Palliative medicine is following.    DVT prophylaxis: SCDs Start: 11/24/22 2126   Code Status:     Code Status: Full Code  Disposition: Likely to long-term care.  Status is: Inpatient  Remains inpatient appropriate because: Multiple comorbidities, severe PEM, severe anemia,, Proteus bacteremia, need for long-term care care placement.   Family Communication:  Spoke with the patient's daughter on the phone and updated her about the clinical condition of the patient on 11/26/2022.  Consultants:  PCCM Palliative care  Procedures:  Urethral catheter   Antimicrobials:  None currently.  Anti-infectives (From admission, onward)    Start     Dose/Rate Route Frequency Ordered Stop   11/25/22 1800  ceFEPIme (MAXIPIME) 2 g in sodium chloride 0.9 % 100 mL IVPB  Status:  Discontinued        2 g 200 mL/hr over 30 Minutes Intravenous Every 24 hours 11/24/22 1645 11/25/22  1037   11/25/22 1800  ceFEPIme (MAXIPIME) 1 g in sodium chloride 0.9 % 100 mL IVPB  Status:  Discontinued        1 g 200 mL/hr over 30 Minutes Intravenous Every 24 hours 11/25/22 1037 11/25/22 1502   11/25/22 1600  cefTRIAXone (ROCEPHIN) 2 g in sodium chloride 0.9 % 100 mL IVPB        2 g 200 mL/hr over 30 Minutes Intravenous Every 24 hours 11/25/22 1502     11/25/22 0600  metroNIDAZOLE (FLAGYL) IVPB 500 mg  Status:  Discontinued        500 mg 100 mL/hr over 60 Minutes Intravenous Every 12  hours 11/24/22 2136 11/25/22 0942   11/24/22 1646  vancomycin variable dose per unstable renal function (pharmacist dosing)  Status:  Discontinued         Does not apply See admin instructions 11/24/22 1646 11/25/22 0942   11/24/22 1630  vancomycin (VANCOREADY) IVPB 1500 mg/300 mL        1,500 mg 150 mL/hr over 120 Minutes Intravenous  Once 11/24/22 1608 11/24/22 1645   11/24/22 1615  ceFEPIme (MAXIPIME) 2 g in sodium chloride 0.9 % 100 mL IVPB        2 g 200 mL/hr over 30 Minutes Intravenous  Once 11/24/22 1600 11/24/22 1726   11/24/22 1615  metroNIDAZOLE (FLAGYL) IVPB 500 mg        500 mg 100 mL/hr over 60 Minutes Intravenous  Once 11/24/22 1600 11/24/22 1815   11/24/22 1615  vancomycin (VANCOCIN) IVPB 1000 mg/200 mL premix  Status:  Discontinued        1,000 mg 200 mL/hr over 60 Minutes Intravenous  Once 11/24/22 1600 11/24/22 1603      Subjective: Denies nausea, vomiting or chills  Objective: Vitals:   11/29/22 0500 11/29/22 0602 11/29/22 0800 11/29/22 1200  BP:  137/67 123/67 116/69  Pulse:  69 71 74  Resp:  19 16 16   Temp:  98.6 F (37 C)    TempSrc:  Oral    SpO2:  98% 97% 98%  Weight: 72 kg     Height:        Intake/Output Summary (Last 24 hours) at 11/29/2022 1244 Last data filed at 11/29/2022 0603 Gross per 24 hour  Intake 360 ml  Output 1600 ml  Net -1240 ml   Filed Weights   11/27/22 0500 11/28/22 0456 11/29/22 0500  Weight: 65.8 kg 66 kg 72 kg    Physical Examination: Body mass index is 24.86 kg/m.   Awake Alert, Oriented X 3, forgetful patient, frail, deconditioned Symmetrical Chest wall movement, Good air movement bilaterally, CTAB RRR,No Gallops,Rubs or new Murmurs, No Parasternal Heave +ve B.Sounds, Abd Soft, No tenderness, No rebound - guarding or rigidity. No Cyanosis, Clubbing or edema, No new Rash or bruise            Data Reviewed:   CBC: Recent Labs  Lab 11/24/22 1553 11/24/22 1801 11/24/22 2240 11/25/22 0848 11/26/22 0853  11/27/22 0057 11/28/22 0159  WBC 39.0*  --   --  39.0* 21.4* 16.0* 10.0  NEUTROABS 35.5*  --   --   --   --   --   --   HGB 6.0*   < > 8.6* 9.0* 8.1* 8.8* 8.1*  HCT 18.6*   < > 26.5* 26.0* 24.6* 26.8* 26.0*  MCV 108.1*  --   --  94.9 96.5 99.6 99.2  PLT 398  --   --  313  292 271 267   < > = values in this interval not displayed.    Basic Metabolic Panel: Recent Labs  Lab 11/24/22 2147 11/25/22 0848 11/25/22 1317 11/26/22 0853 11/27/22 0057 11/28/22 0159  NA 133* 134* 134* 134* 134* 136  K 4.9 6.3* 4.2 3.6 3.7 3.9  CL 100 101 97* 99 99 101  CO2 18* 19* 24 25 23 25   GLUCOSE 95 97 121* 81 118* 114*  BUN 60* 70* 64* 67* 66* 74*  CREATININE 3.67* 3.75* 3.74* 3.32* 3.01* 2.09*  CALCIUM 7.8* 7.5* 7.8* 7.7* 7.9* 8.2*  MG 1.8 1.9  --  1.8 1.8  --   PHOS 4.5 5.1*  --   --   --   --     Liver Function Tests: Recent Labs  Lab 11/24/22 1553 11/24/22 2147  AST 22 34  ALT 15 17  ALKPHOS 102 95  BILITOT 1.1 1.5*  PROT 5.5* 5.0*  ALBUMIN 1.9* 1.8*     Radiology Studies: No results found.    LOS: 5 days     Huey Bienenstock, MD [facility] Available via Epic secure chat 7am-7pm After these hours, please refer to coverage provider listed on [email or web address] 11/29/2022, 12:44 PM

## 2022-11-29 NOTE — NC FL2 (Signed)
[place] MEDICAID FL2 LEVEL OF CARE FORM     IDENTIFICATION  Patient Name: [name]. Birthdate: 1929-01-07 Sex: male Admission Date (Current Location): 11/24/2022  [facility] and [place] Number:  [employer] and Address:  [facility], [address] [place], [place] [address]      Provider Number: [id number]  Attending Physician Name and Address:  Elgergawy, Leana Roe, MD  Relative Name and Phone Number:       Current Level of Care: Hospital Recommended Level of Care: Skilled Nursing Facility Prior Approval Number:    Date Approved/Denied:   PASRR Number: [id number]  Discharge Plan: SNF    Current Diagnoses: Patient Active Problem List   Diagnosis Date Noted   Protein-calorie malnutrition, severe 11/25/2022   Sepsis (HCC) 11/24/2022   Intensive care (ICU) myopathy 10/29/2022   Amputation of fifth toe of right foot (HCC) 10/29/2022   Leg edema 09/23/2022   Hyponatremia 09/23/2022   Azotemia 09/22/2022   Prostate cancer (HCC) 09/22/2022   Chronic anticoagulation 09/17/2022   Anemia 09/16/2022   Constipation 09/16/2022   Debility 09/10/2022   PAD (peripheral artery disease) (HCC) 09/01/2022   Osteomyelitis of fifth toe of right foot (HCC) 08/31/2022   Bacteremia 08/31/2022   Acute cystitis without hematuria 08/31/2022   Polymicrobial bacterial infection 08/26/2022   Fever of undetermined origin 08/25/2022   Lactic acidosis 08/25/2022   Generalized weakness 08/25/2022   Bronchitis 08/25/2022   Bilateral pleural effusion 08/25/2022   Right foot ulcer (HCC) 08/25/2022   Essential hypertension 08/25/2022   BPH (benign prostatic hyperplasia) 08/25/2022   Atrial fibrillation (HCC) 08/25/2022    Orientation RESPIRATION BLADDER Height & Weight     Time, Self, Situation, Place  Normal Continent, Indwelling catheter Weight: 158 lb 11.7 oz (72 kg) Height:  5\' 7"  (170.2 cm)  BEHAVIORAL SYMPTOMS/MOOD NEUROLOGICAL BOWEL  NUTRITION STATUS      Continent Diet (See DC Summary)  AMBULATORY STATUS COMMUNICATION OF NEEDS Skin   Extensive Assist Verbally PU Stage and Appropriate Care (Stage IV on coccyx; unstageable on heel; deep tissue on heel; Stage II on buttocks)                       Personal Care Assistance Level of Assistance  Bathing, Feeding, Dressing Bathing Assistance: Maximum assistance Feeding assistance: Limited assistance Dressing Assistance: Limited assistance     Functional Limitations Info  Sight, Hearing Sight Info: Impaired Hearing Info: Impaired      SPECIAL CARE FACTORS FREQUENCY  PT (By licensed PT), OT (By licensed OT)     PT Frequency: 5x/week OT Frequency: 5x/week            Contractures Contractures Info: Not present    Additional Factors Info  Code Status, Allergies Code Status Info: Full Allergies Info: Codeine           Current Medications (11/29/2022):  This is the current hospital active medication list Current Facility-Administered Medications  Medication Dose Route Frequency Provider Last Rate Last Admin   0.9 %  sodium chloride infusion  250 mL Intravenous Continuous Tim Lair, PA-C   Stopped at 11/26/22 2149   acetaminophen (TYLENOL) tablet 650 mg  650 mg Oral Q6H PRN Elgergawy, Leana Roe, MD   650 mg at 11/29/22 0907   ceFAZolin (ANCEF) IVPB 2g/100 mL premix  2 g Intravenous Q12H Elgergawy, Leana Roe, MD 200 mL/hr at 11/29/22 1608 2 g at 11/29/22 1608   Chlorhexidine  Gluconate Cloth 2 % PADS 6 each  6 each Topical Q0600 Cheri Fowler, MD   6 each at 11/29/22 0530   docusate sodium (COLACE) capsule 100 mg  100 mg Oral BID PRN Tim Lair, PA-C       feeding supplement (ENSURE ENLIVE / ENSURE PLUS) liquid 237 mL  237 mL Oral BID BM Charlott Holler, MD   237 mL at 11/29/22 1603   leptospermum manuka honey (MEDIHONEY) paste 1 Application  1 Application Topical Daily Charlott Holler, MD   1 Application at 11/29/22 1324   multivitamin with  minerals tablet 1 tablet  1 tablet Oral Daily Charlott Holler, MD   1 tablet at 11/29/22 4010   nutrition supplement (JUVEN) (JUVEN) powder packet 1 packet  1 packet Oral BID BM Charlott Holler, MD   1 packet at 11/29/22 0906   ondansetron (ZOFRAN) injection 4 mg  4 mg Intravenous Q6H PRN Paliwal, Eliezer Lofts, MD       Oral care mouth rinse  15 mL Mouth Rinse PRN Tim Lair, PA-C       Oral care mouth rinse  15 mL Mouth Rinse 4 times per day Elgergawy, Leana Roe, MD   15 mL at 11/29/22 1603   Oral care mouth rinse  15 mL Mouth Rinse PRN Elgergawy, Leana Roe, MD       pantoprazole (PROTONIX) injection 40 mg  40 mg Intravenous Q12H Cloyd Stagers M, PA-C   40 mg at 11/29/22 2725   polyethylene glycol (MIRALAX / GLYCOLAX) packet 17 g  17 g Oral Daily PRN Tim Lair, PA-C         Discharge Medications: Please see discharge summary for a list of discharge medications.  Relevant Imaging Results:  Relevant Lab Results:   Additional Information SSN: [id number] [place], [place]

## 2022-11-29 NOTE — Plan of Care (Signed)
  Problem: Nutrition: Goal: Adequate nutrition will be maintained Outcome: Progressing   Problem: Elimination: Goal: Will not experience complications related to bowel motility Outcome: Progressing   Problem: Safety: Goal: Ability to remain free from injury will improve Outcome: Progressing   Problem: Skin Integrity: Goal: Risk for impaired skin integrity will decrease Outcome: Progressing   

## 2022-11-29 NOTE — TOC Progression Note (Signed)
Transition of Care (TOC) - Progression Note    Patient Details  Name: Jesse RABE Sr. MRN: [id number] Date of Birth: [birth year]  [facility] CM/SW Contact  Mearl Latin, LCSW Phone Number: 11/29/2022, 4:27 PM  Clinical Narrative:    CSW updated [facility] that patient will likely be ready for discharge tomorrow per MD.   Expected Discharge Plan: Skilled Nursing Facility Barriers to Discharge: Continued Medical Work up  Expected Discharge Plan and Services In-house Referral: Clinical Social Work   Post Acute Care Choice: Skilled Nursing Facility Living arrangements for the past 2 months: Skilled Nursing Facility                                       Social Determinants of Health (SDOH) Interventions SDOH Screenings   Food Insecurity: No Food Insecurity (08/25/2022)  Housing: Low Risk  (08/25/2022)  Transportation Needs: No Transportation Needs (08/25/2022)  Utilities: Not At Risk (08/25/2022)  Alcohol Screen: Low Risk  (07/24/2019)  Depression (PHQ2-9): Low Risk  (07/24/2019)  Financial Resource Strain: Low Risk  (07/24/2019)  Physical Activity: Inactive (07/24/2019)  Social Connections: Moderately Integrated (07/24/2019)  Stress: No Stress Concern Present (07/24/2019)  Tobacco Use: Low Risk  (11/24/2022)    Readmission Risk Interventions    11/29/2022    4:25 PM  Readmission Risk Prevention Plan  Transportation Screening Complete  Medication Review (RN Care Manager) Complete  PCP or Specialist appointment within 3-5 days of discharge Complete  HRI or Home Care Consult Complete  SW Recovery Care/Counseling Consult Complete  Palliative Care Screening Complete  Skilled Nursing Facility Complete

## 2022-11-29 NOTE — Progress Notes (Signed)
Daily Progress Note   Patient Name: [name].       Date: 11/29/2022 DOB: 24-Apr-1928  Age: 89 y.o. MRN#: [id number] Attending Physician: Starleen Arms, MD Primary Care Physician: Elfredia Nevins, MD Admit Date: 11/24/2022  Reason for Consultation/Follow-up: {Reason for Consult:23484}  Subjective: ***  Length of Stay: 5  Current Medications: Scheduled Meds:   Chlorhexidine Gluconate Cloth  6 each Topical Q0600   feeding supplement  237 mL Oral BID BM   leptospermum manuka honey  1 Application Topical Daily   magnesium citrate  1 Bottle Oral Once   multivitamin with minerals  1 tablet Oral Daily   nutrition supplement (JUVEN)  1 packet Oral BID BM   mouth rinse  15 mL Mouth Rinse 4 times per day   pantoprazole (PROTONIX) IV  40 mg Intravenous Q12H    Continuous Infusions:  sodium chloride Stopped (11/26/22 2149)    ceFAZolin (ANCEF) IV      PRN Meds: acetaminophen, docusate sodium, ondansetron (ZOFRAN) IV, mouth rinse, mouth rinse, polyethylene glycol  Physical Exam          Vital Signs: BP 116/69 (BP Location: Left Arm)   Pulse 74   Temp 98.6 F (37 C) (Oral)   Resp 16   Ht 5\' 7"  (1.702 m)   Wt 72 kg   SpO2 98%   BMI 24.86 kg/m  SpO2: SpO2: 98 % O2 Device: O2 Device: Room Air O2 Flow Rate:    Intake/output summary:  Intake/Output Summary (Last 24 hours) at 11/29/2022 1425 Last data filed at 11/29/2022 1333 Gross per 24 hour  Intake 240 ml  Output 2500 ml  Net -2260 ml   LBM: Last BM Date : 11/25/22 Baseline Weight: Weight: 85.3 kg Most recent weight: Weight: 72 kg       Palliative Assessment/Data:      Patient Active Problem List   Diagnosis Date Noted   Protein-calorie malnutrition, severe 11/25/2022   Sepsis (HCC) 11/24/2022   Intensive care (ICU)  myopathy 10/29/2022   Amputation of fifth toe of right foot (HCC) 10/29/2022   Leg edema 09/23/2022   Hyponatremia 09/23/2022   Azotemia 09/22/2022   Prostate cancer (HCC) 09/22/2022   Chronic anticoagulation 09/17/2022   Anemia 09/16/2022   Constipation 09/16/2022   Debility 09/10/2022   PAD (peripheral artery disease) (HCC) 09/01/2022   Osteomyelitis of fifth toe of right foot (HCC) 08/31/2022   Bacteremia 08/31/2022   Acute cystitis without hematuria 08/31/2022   Polymicrobial bacterial infection 08/26/2022   Fever of undetermined origin 08/25/2022   Lactic acidosis 08/25/2022   Generalized weakness 08/25/2022   Bronchitis 08/25/2022   Bilateral pleural effusion 08/25/2022   Right foot ulcer (HCC) 08/25/2022   Essential hypertension 08/25/2022   BPH (benign prostatic hyperplasia) 08/25/2022   Atrial fibrillation (HCC) 08/25/2022    Palliative Care Assessment & Plan   HPI: ***  Assessment: ***  Recommendations/Plan: ***  Goals of Care and Additional Recommendations: Limitations on Scope of Treatment: {Recommended Scope and Preferences:21019}  Code Status: {Palliative Code status:23503}  Prognosis:  {Palliative Care Prognosis:23504}  Discharge Planning: {Palliative dispostion:23505}  Care plan was discussed with ***  Thank you for allowing the Palliative Medicine  Team to assist in the care of this patient.   Time In: *** Time Out: *** Total Time *** Prolonged Time Billed  {YES NO:22349}       Greater than 50%  of this time was spent counseling and coordinating care related to the above assessment and plan.  *Please note that this is a verbal dictation therefore any spelling or grammatical errors are due to the "[employer] One" system interpretation.  Gerlean Ren, DNP, [facility] Palliative Medicine Team Team Phone # [phone]  Pager [phone]

## 2022-11-30 DIAGNOSIS — M869 Osteomyelitis, unspecified: Secondary | ICD-10-CM | POA: Diagnosis not present

## 2022-11-30 DIAGNOSIS — L89610 Pressure ulcer of right heel, unstageable: Secondary | ICD-10-CM | POA: Diagnosis not present

## 2022-11-30 DIAGNOSIS — N400 Enlarged prostate without lower urinary tract symptoms: Secondary | ICD-10-CM | POA: Diagnosis not present

## 2022-11-30 DIAGNOSIS — R609 Edema, unspecified: Secondary | ICD-10-CM | POA: Diagnosis not present

## 2022-11-30 DIAGNOSIS — R1311 Dysphagia, oral phase: Secondary | ICD-10-CM | POA: Diagnosis not present

## 2022-11-30 DIAGNOSIS — E8809 Other disorders of plasma-protein metabolism, not elsewhere classified: Secondary | ICD-10-CM | POA: Diagnosis not present

## 2022-11-30 DIAGNOSIS — S31000A Unspecified open wound of lower back and pelvis without penetration into retroperitoneum, initial encounter: Secondary | ICD-10-CM | POA: Diagnosis not present

## 2022-11-30 DIAGNOSIS — E860 Dehydration: Secondary | ICD-10-CM | POA: Diagnosis not present

## 2022-11-30 DIAGNOSIS — N390 Urinary tract infection, site not specified: Secondary | ICD-10-CM | POA: Diagnosis not present

## 2022-11-30 DIAGNOSIS — Z89421 Acquired absence of other right toe(s): Secondary | ICD-10-CM | POA: Diagnosis not present

## 2022-11-30 DIAGNOSIS — L89150 Pressure ulcer of sacral region, unstageable: Secondary | ICD-10-CM | POA: Diagnosis not present

## 2022-11-30 DIAGNOSIS — R159 Full incontinence of feces: Secondary | ICD-10-CM | POA: Diagnosis not present

## 2022-11-30 DIAGNOSIS — D649 Anemia, unspecified: Secondary | ICD-10-CM | POA: Diagnosis not present

## 2022-11-30 DIAGNOSIS — R918 Other nonspecific abnormal finding of lung field: Secondary | ICD-10-CM | POA: Diagnosis not present

## 2022-11-30 DIAGNOSIS — L89154 Pressure ulcer of sacral region, stage 4: Secondary | ICD-10-CM | POA: Diagnosis not present

## 2022-11-30 DIAGNOSIS — R410 Disorientation, unspecified: Secondary | ICD-10-CM | POA: Diagnosis not present

## 2022-11-30 DIAGNOSIS — R4182 Altered mental status, unspecified: Secondary | ICD-10-CM | POA: Diagnosis not present

## 2022-11-30 DIAGNOSIS — R41841 Cognitive communication deficit: Secondary | ICD-10-CM | POA: Diagnosis not present

## 2022-11-30 DIAGNOSIS — N39498 Other specified urinary incontinence: Secondary | ICD-10-CM | POA: Diagnosis not present

## 2022-11-30 DIAGNOSIS — I70221 Atherosclerosis of native arteries of extremities with rest pain, right leg: Secondary | ICD-10-CM | POA: Diagnosis not present

## 2022-11-30 DIAGNOSIS — R5383 Other fatigue: Secondary | ICD-10-CM | POA: Diagnosis not present

## 2022-11-30 DIAGNOSIS — L8990 Pressure ulcer of unspecified site, unspecified stage: Secondary | ICD-10-CM | POA: Diagnosis not present

## 2022-11-30 DIAGNOSIS — E872 Acidosis, unspecified: Secondary | ICD-10-CM | POA: Diagnosis not present

## 2022-11-30 DIAGNOSIS — D539 Nutritional anemia, unspecified: Secondary | ICD-10-CM | POA: Diagnosis not present

## 2022-11-30 DIAGNOSIS — A419 Sepsis, unspecified organism: Secondary | ICD-10-CM | POA: Diagnosis not present

## 2022-11-30 DIAGNOSIS — L89620 Pressure ulcer of left heel, unstageable: Secondary | ICD-10-CM | POA: Diagnosis not present

## 2022-11-30 DIAGNOSIS — E43 Unspecified severe protein-calorie malnutrition: Secondary | ICD-10-CM | POA: Diagnosis not present

## 2022-11-30 DIAGNOSIS — R0902 Hypoxemia: Secondary | ICD-10-CM | POA: Diagnosis not present

## 2022-11-30 DIAGNOSIS — Z1152 Encounter for screening for COVID-19: Secondary | ICD-10-CM | POA: Diagnosis not present

## 2022-11-30 DIAGNOSIS — E782 Mixed hyperlipidemia: Secondary | ICD-10-CM | POA: Diagnosis not present

## 2022-11-30 DIAGNOSIS — R2681 Unsteadiness on feet: Secondary | ICD-10-CM | POA: Diagnosis not present

## 2022-11-30 DIAGNOSIS — I5031 Acute diastolic (congestive) heart failure: Secondary | ICD-10-CM | POA: Diagnosis not present

## 2022-11-30 DIAGNOSIS — Z8546 Personal history of malignant neoplasm of prostate: Secondary | ICD-10-CM | POA: Diagnosis not present

## 2022-11-30 DIAGNOSIS — J9601 Acute respiratory failure with hypoxia: Secondary | ICD-10-CM | POA: Diagnosis not present

## 2022-11-30 DIAGNOSIS — Z4781 Encounter for orthopedic aftercare following surgical amputation: Secondary | ICD-10-CM | POA: Diagnosis not present

## 2022-11-30 DIAGNOSIS — N179 Acute kidney failure, unspecified: Secondary | ICD-10-CM | POA: Diagnosis not present

## 2022-11-30 DIAGNOSIS — Z7901 Long term (current) use of anticoagulants: Secondary | ICD-10-CM | POA: Diagnosis not present

## 2022-11-30 DIAGNOSIS — G9341 Metabolic encephalopathy: Secondary | ICD-10-CM | POA: Diagnosis not present

## 2022-11-30 DIAGNOSIS — G319 Degenerative disease of nervous system, unspecified: Secondary | ICD-10-CM | POA: Diagnosis not present

## 2022-11-30 DIAGNOSIS — R531 Weakness: Secondary | ICD-10-CM | POA: Diagnosis not present

## 2022-11-30 DIAGNOSIS — E46 Unspecified protein-calorie malnutrition: Secondary | ICD-10-CM | POA: Diagnosis not present

## 2022-11-30 DIAGNOSIS — J40 Bronchitis, not specified as acute or chronic: Secondary | ICD-10-CM | POA: Diagnosis not present

## 2022-11-30 DIAGNOSIS — K922 Gastrointestinal hemorrhage, unspecified: Secondary | ICD-10-CM | POA: Diagnosis not present

## 2022-11-30 DIAGNOSIS — R627 Adult failure to thrive: Secondary | ICD-10-CM | POA: Diagnosis not present

## 2022-11-30 DIAGNOSIS — Z7401 Bed confinement status: Secondary | ICD-10-CM | POA: Diagnosis not present

## 2022-11-30 DIAGNOSIS — I10 Essential (primary) hypertension: Secondary | ICD-10-CM | POA: Diagnosis not present

## 2022-11-30 DIAGNOSIS — N3001 Acute cystitis with hematuria: Secondary | ICD-10-CM | POA: Diagnosis not present

## 2022-11-30 DIAGNOSIS — Y846 Urinary catheterization as the cause of abnormal reaction of the patient, or of later complication, without mention of misadventure at the time of the procedure: Secondary | ICD-10-CM | POA: Diagnosis present

## 2022-11-30 DIAGNOSIS — T83511A Infection and inflammatory reaction due to indwelling urethral catheter, initial encounter: Secondary | ICD-10-CM | POA: Diagnosis not present

## 2022-11-30 DIAGNOSIS — R7989 Other specified abnormal findings of blood chemistry: Secondary | ICD-10-CM | POA: Diagnosis not present

## 2022-11-30 DIAGNOSIS — K5900 Constipation, unspecified: Secondary | ICD-10-CM | POA: Diagnosis not present

## 2022-11-30 DIAGNOSIS — M6281 Muscle weakness (generalized): Secondary | ICD-10-CM | POA: Diagnosis not present

## 2022-11-30 DIAGNOSIS — R001 Bradycardia, unspecified: Secondary | ICD-10-CM | POA: Diagnosis not present

## 2022-11-30 DIAGNOSIS — I4891 Unspecified atrial fibrillation: Secondary | ICD-10-CM | POA: Diagnosis not present

## 2022-11-30 DIAGNOSIS — N3000 Acute cystitis without hematuria: Secondary | ICD-10-CM | POA: Diagnosis not present

## 2022-11-30 DIAGNOSIS — J9811 Atelectasis: Secondary | ICD-10-CM | POA: Diagnosis not present

## 2022-11-30 DIAGNOSIS — Z515 Encounter for palliative care: Secondary | ICD-10-CM | POA: Diagnosis not present

## 2022-11-30 DIAGNOSIS — I700 Atherosclerosis of aorta: Secondary | ICD-10-CM | POA: Diagnosis not present

## 2022-11-30 DIAGNOSIS — C61 Malignant neoplasm of prostate: Secondary | ICD-10-CM | POA: Diagnosis not present

## 2022-11-30 DIAGNOSIS — E785 Hyperlipidemia, unspecified: Secondary | ICD-10-CM | POA: Diagnosis not present

## 2022-11-30 DIAGNOSIS — R5381 Other malaise: Secondary | ICD-10-CM | POA: Diagnosis not present

## 2022-11-30 DIAGNOSIS — R6521 Severe sepsis with septic shock: Secondary | ICD-10-CM | POA: Diagnosis not present

## 2022-11-30 DIAGNOSIS — I739 Peripheral vascular disease, unspecified: Secondary | ICD-10-CM | POA: Diagnosis not present

## 2022-11-30 DIAGNOSIS — I480 Paroxysmal atrial fibrillation: Secondary | ICD-10-CM | POA: Diagnosis not present

## 2022-11-30 DIAGNOSIS — R000 Tachycardia, unspecified: Secondary | ICD-10-CM | POA: Diagnosis not present

## 2022-11-30 DIAGNOSIS — L89616 Pressure-induced deep tissue damage of right heel: Secondary | ICD-10-CM | POA: Diagnosis not present

## 2022-11-30 DIAGNOSIS — K219 Gastro-esophageal reflux disease without esophagitis: Secondary | ICD-10-CM | POA: Diagnosis not present

## 2022-11-30 DIAGNOSIS — J90 Pleural effusion, not elsewhere classified: Secondary | ICD-10-CM | POA: Diagnosis not present

## 2022-11-30 MED ORDER — ACETAMINOPHEN 325 MG PO TABS: 650.0000 mg | ORAL_TABLET | Freq: Four times a day (QID) | ORAL | Status: AC | PRN

## 2022-11-30 MED ORDER — CEFADROXIL 1 G PO TABS: 1.0000 g | ORAL_TABLET | Freq: Two times a day (BID) | ORAL | Status: AC

## 2022-11-30 MED ORDER — CEPHALEXIN 500 MG PO CAPS: 500.0000 mg | ORAL_CAPSULE | Freq: Three times a day (TID) | ORAL | Status: DC

## 2022-11-30 MED ORDER — POLYETHYLENE GLYCOL 3350 17 G PO PACK
34.0000 g | PACK | Freq: Once | ORAL | Status: AC
  Administered 2022-11-30: 34 g via ORAL
  Filled 2022-11-30: qty 2

## 2022-11-30 MED ORDER — BISACODYL 5 MG PO TBEC
10.0000 mg | DELAYED_RELEASE_TABLET | Freq: Once | ORAL | Status: AC
  Administered 2022-11-30: 10 mg via ORAL
  Filled 2022-11-30: qty 2

## 2022-11-30 NOTE — Progress Notes (Signed)
Attempted report to [facility] x2. Still unable to reach nurse.

## 2022-11-30 NOTE — Discharge Summary (Signed)
Physician Discharge Summary  Jesse YOUKER Sr. NWG:[id number] DOB: Nov 15, 1928 DOA: 11/24/2022  PCP: Elfredia Nevins, MD  Admit date: 11/24/2022 Discharge date: 11/30/2022  Admitted From: (SNF) Disposition:  (SNF)  Recommendations for Outpatient Follow-up:  Please check CBC, BMP in 1 week Please continue with dressing changes per wound care dressing recommendations section   Discharge Condition: (Stable, guarded, hospice) CODE STATUS: (FULL) Diet recommendation: Regular   Brief/Interim Summary:   89 year old man male with past medical history of hypertension, paroxysmal atrial fibrillation on Eliquis, peripheral arterial disease status post right fifth toe amputation, chronic indwelling Foley catheter, BPH, prostate cancer who was recently admitted hospital in May 2024 for critical limb ischemia of the right lower extremity with osteomyelitis requiring ray amputation was discharged to inpatient rehab between 09/10/2022 to 09/24/2022 and then subsequently to skilled nursing facility.  He has been having general functional decline since the beginning of 2024.  At this time, patient presented from skilled nursing with altered mental status with several episodes of nausea and vomiting.   patient was hypotensive.  Initial hemoglobin was 6.0 from baseline 8-10, WBC was elevated at 39K, INR was elevated at 5.2.  He had mild hyponatremia with sodium of 133 and creatinine was elevated at 3.9, albumin low at 1.9.  LFTs were within normal limits.  Had elevated lactic acid and urine showed large leukocytes but nitrites were negative.  Patient was given broad-spectrum antibiotics and was admitted hospital for further evaluation and treatment.  Patient was also given 2 units of packed RBC for low hemoglobin.  Patient was initially admitted to the ICU for concerns of sepsis and or bleed.  Vital signs has improved, he was transferred to progressive care, his workup significant for septic shock secondary to Proteus  bacteremia, Foley related UTI with hematuria, patient has stabilized, much improved plan to transfer back to SNF on p.o. antibiotics.  Septic shock  Proteus bacteremia/Foley related UTI/cystitis with hematuria Present on admission - Was febrile and hypotensive, leukocytosis, coagulopathy with elevated procalcitonin at 54.  Required vasopressors during ICU stay.  Patient was initially on vancomycin and cefepime.  blood culture and urine culture growing Proteus, treated with IV Rocephin during hospital stay, he will be transitioned to p.o. cefadroxil at time of discharge, to finish another 5 days. - Blood pressure stable at this time.   Acute kidney injury.  -  Baseline creatinine around 0.9.  up to 3.9 on admission, continue continues to improve, it is 1.15 currently    Acute blood loss anemia in the setting of gross hematuria, Elevated INR/coagulopathy Had received 2 units of packed RBC at outside hospital for hemoglobin of 4.5 and INR of 5.4. -On Eliquis at home, this has been held during hospital stay, hemoglobin has been stable during hospital stay, Eliquis was resumed at time of discharge,. - FOBT was positive but DRE was negative for blood.  No evidence of significant GI bleed during hospital stay, continue with PPI   Coagulopathy -likely due to sepsis, as well level is unreliable as on Eliquis, but much improved with vitamin K.   Chronic indwelling Foley catheter History of UTI Sepsis secondary to UTI. Prostate CA Follows up with Dr. Retta Diones urology as outpatient.  Has progressive prostate cancer.  Bone imaging was  negative on 2023.  Not on Flomax.  Currently on Foley catheter.  Continue to monitor closely.   Paroxysmal Afib/ Lower extremity edema. -Followed as outpatient by Dr. Rosemary Holms, last seen 7/22. CHA2DS2-VASc Score 5.  Continue telemetry.   -  2D echocardiogram with LV ejection fraction of 65 to 70% with LVH.  -Continue to hold Eliquis in the setting of acute blood loss  anemia due to hematuria, this appears to be improving, as well coagulopathy is improving.  Liquids resumed at time of discharge.    PAD/Critical limb ischemia of RLE requiring fifth partial ray amputation History of osteomyelitis Patient had underwent abdominal aortogram with runoff 5/16 with [facility] Karin Lieu) with recanalization and ballon angioplasty of tibioperoneal trunk. Ultimately required R fifth partial ray amputation 5/22 with Ortho Lajoyce Corners).   -No evidence of infection on clinical exam, bacteremia related to UTI.   Physical deconditioning -Discharged to inpatient rehab (5/24 - 6/7) then to SNF. General functional decline since the beginning of 2024.  Will likely benefit from long-term care. PT OT recommends long-term care.   Severe protein calorie malnutrition. Hypoalbuminemia Nutrition Status: Nutrition Problem: Severe Malnutrition Etiology: chronic illness Signs/Symptoms: severe fat depletion, severe muscle depletion, percent weight loss (38% weight loss in 2 months) Percent weight loss: 38 % Interventions: Ensure Enlive (each supplement provides 350kcal and 20 grams of protein), MVI, Magic cup, Juven   Pressure injury present on admission. Continue wound care. Pressure Injury 08/25/22 Heel Left Deep Tissue Pressure Injury - Purple or maroon localized area of discolored intact skin or blood-filled blister due to damage of underlying soft tissue from pressure and/or shear. 6.5cm x 5.5cm (Active)  08/25/22 0730  Location: Heel  Location Orientation: Left  Staging: Deep Tissue Pressure Injury - Purple or maroon localized area of discolored intact skin or blood-filled blister due to damage of underlying soft tissue from pressure and/or shear.  Wound Description (Comments): 6.5cm x 5.5cm  Present on Admission: Yes     Pressure Injury 08/25/22 Buttocks Right Stage 2 -  Partial thickness loss of dermis presenting as a shallow open injury with a red, pink wound bed without slough. (Active)   08/25/22   Location: Buttocks  Location Orientation: Right  Staging: Stage 2 -  Partial thickness loss of dermis presenting as a shallow open injury with a red, pink wound bed without slough.  Wound Description (Comments):   Present on Admission: Yes     Pressure Injury 11/24/22 Coccyx Medial Stage 4 - Full thickness tissue loss with exposed bone, tendon or muscle. (Active)  11/24/22 2130  Location: Coccyx  Location Orientation: Medial  Staging: Stage 4 - Full thickness tissue loss with exposed bone, tendon or muscle.  Wound Description (Comments):   Present on Admission: Yes     Pressure Injury 11/25/22 Heel Right Unstageable - Full thickness tissue loss in which the base of the injury is covered by slough (yellow, tan, gray, green or brown) and/or eschar (tan, brown or black) in the wound bed. Eschar (Active)  11/25/22 0800  Location: Heel  Location Orientation: Right  Staging: Unstageable - Full thickness tissue loss in which the base of the injury is covered by slough (yellow, tan, gray, green or brown) and/or eschar (tan, brown or black) in the wound bed.  Wound Description (Comments): Eschar  Present on Admission: Yes        Discharge Diagnoses:  Principal Problem:   Sepsis (HCC) Active Problems:   Essential hypertension   Atrial fibrillation (HCC)   Bacteremia   Acute cystitis with hematuria   Debility   Anemia   Amputation of fifth toe of right foot (HCC)   Protein-calorie malnutrition, severe   Pressure injury of skin    Discharge Instructions  Discharge Instructions  Diet - low sodium heart healthy   Complete by: As directed    Discharge instructions   Complete by: As directed    Follow with Primary MD Elfredia Nevins, MD /SNF physician  Get CBC, CMP,  checked  by Primary MD next visit.    Activity: As tolerated with Full fall precautions use walker/cane & assistance as needed   Disposition SNF  Diet: regular   On your next visit with  your primary care physician please Get Medicines reviewed and adjusted.   Please request your Prim.MD to go over all Hospital Tests and Procedure/Radiological results at the follow up, please get all Hospital records sent to your Prim MD by signing hospital release before you go home.   If you experience worsening of your admission symptoms, develop shortness of breath, life threatening emergency, suicidal or homicidal thoughts you must seek medical attention immediately by calling 911 or calling your MD immediately  if symptoms less severe.  You Must read complete instructions/literature along with all the possible adverse reactions/side effects for all the Medicines you take and that have been prescribed to you. Take any new Medicines after you have completely understood and accpet all the possible adverse reactions/side effects.   Do not drive, operating heavy machinery, perform activities at heights, swimming or participation in water activities or provide baby sitting services if your were admitted for syncope or siezures until you have seen by Primary MD or a Neurologist and advised to do so again.  Do not drive when taking Pain medications.    Do not take more than prescribed Pain, Sleep and Anxiety Medications  Special Instructions: If you have smoked or chewed Tobacco  in the last 2 yrs please stop smoking, stop any regular Alcohol  and or any Recreational drug use.  Wear Seat belts while driving.   Please note  You were cared for by a hospitalist during your hospital stay. If you have any questions about your discharge medications or the care you received while you were in the hospital after you are discharged, you can call the unit and asked to speak with the hospitalist on call if the hospitalist that took care of you is not available. Once you are discharged, your primary care physician will handle any further medical issues. Please note that NO REFILLS for any discharge  medications will be authorized once you are discharged, as it is imperative that you return to your primary care physician (or establish a relationship with a primary care physician if you do not have one) for your aftercare needs so that they can reassess your need for medications and monitor your lab values.   Discharge wound care:   Complete by: As directed    11/26/22 0500    Wound care  Daily      Comments: Paint left great toe with betadine daily, allow to air dry. No dressing needed.  11/25/22 0912    11/26/22 0500    Wound care  Daily      Comments: Cleanse sacral wound with saline, pat dry. Apply 1/4" thick layer of leptospermum honey to wound bed, pack with saline moist gauze, change daily. Top with dry dressing  11/25/22 0912   11/25/22 1000    Wound care  Every 3 days     Comments: Cleanse right ischial wound with saline, pat dry. Silicone foam dressing, change every 3 days. ASSESS UNDER dressings each shift for any acute changes in the wounds.   Increase activity slowly  Complete by: As directed       Allergies as of 11/30/2022       Reactions   Codeine Swelling        Medication List     TAKE these medications    acetaminophen 325 MG tablet Commonly known as: TYLENOL Take 2 tablets (650 mg total) by mouth every 6 (six) hours as needed for mild pain, moderate pain, fever or headache. What changed:  medication strength how much to take when to take this reasons to take this   alum & mag hydroxide-simeth 200-200-20 MG/5ML suspension Commonly known as: MAALOX/MYLANTA Take 15-30 mLs by mouth every 2 (two) hours as needed for indigestion. What changed: how much to take   apixaban 5 MG Tabs tablet Commonly known as: ELIQUIS Take 1 tablet (5 mg total) by mouth 2 (two) times daily.   ascorbic acid 1000 MG tablet Commonly known as: VITAMIN C Take 1 tablet (1,000 mg total) by mouth daily.   atorvastatin 40 MG tablet Commonly known as: LIPITOR Take 1 tablet  (40 mg total) by mouth daily.   cefadroxil 1 g tablet Commonly known as: DURICEF Take 1 tablet (1 g total) by mouth 2 (two) times daily for 5 days.   furosemide 20 MG tablet Commonly known as: LASIX Take 1 tablet (20 mg total) by mouth daily.   guaiFENesin-dextromethorphan 100-10 MG/5ML syrup Commonly known as: ROBITUSSIN DM Take 15 mLs by mouth every 4 (four) hours as needed for cough.   lactose free nutrition Liqd Take 237 mLs by mouth 2 (two) times daily.   multivitamin with minerals Tabs tablet Take 1 tablet by mouth daily.   omega-3 acid ethyl esters 1 g capsule Commonly known as: LOVAZA Take 1 capsule (1 g total) by mouth daily.   ondansetron 4 MG tablet Commonly known as: ZOFRAN Take 4 mg by mouth every 6 (six) hours as needed for nausea or vomiting.   pantoprazole 40 MG tablet Commonly known as: PROTONIX Take 1 tablet (40 mg total) by mouth 2 (two) times daily.   polyethylene glycol 17 g packet Commonly known as: MIRALAX / GLYCOLAX Take 17 g by mouth daily. Hold for loose BM.   PRO-STAT PROFILE PO Take 30 mLs by mouth 3 (three) times daily.   senna-docusate 8.6-50 MG tablet Commonly known as: Senokot-S Take 1 tablet by mouth 2 (two) times daily.   tamsulosin 0.4 MG Caps capsule Commonly known as: FLOMAX Take 0.4 mg by mouth daily after supper.               Discharge Care Instructions  (From admission, onward)           Start     Ordered   11/30/22 0000  Discharge wound care:       Comments: 11/26/22 0500    Wound care  Daily      Comments: Paint left great toe with betadine daily, allow to air dry. No dressing needed.  11/25/22 0912    11/26/22 0500    Wound care  Daily      Comments: Cleanse sacral wound with saline, pat dry. Apply 1/4" thick layer of leptospermum honey to wound bed, pack with saline moist gauze, change daily. Top with dry dressing  11/25/22 0912   11/25/22 1000    Wound care  Every 3 days     Comments: Cleanse right  ischial wound with saline, pat dry. Silicone foam dressing, change every 3 days. ASSESS UNDER dressings each shift for  any acute changes in the wounds.   11/30/22 0854            Contact information for after-discharge care     Destination     [facility] .   Service: Skilled Nursing Contact information: [address] [place] [address] [phone]                    Allergies  Allergen Reactions   Codeine Swelling    Consultations: PCCM Palliaitve care   Procedures/Studies: [facility] Chest Port 1 View  Result Date: 11/24/2022 CLINICAL DATA:  Sepsis EXAM: PORTABLE CHEST 1 VIEW COMPARISON:  08/24/2022 x-ray and CT angiogram FINDINGS: The heart size and mediastinal contours are within normal limits. Film is rotated to the right. Calcified aorta. No consolidation, pneumothorax or effusion. No edema. Overlapping cardiac leads. The visualized skeletal structures are unremarkable. IMPRESSION: Rotated radiograph.  No acute cardiopulmonary disease. Electronically Signed   By: Karen Kays M.D.   On: 11/24/2022 16:27      Subjective: No significant events overnight, he denies any complaints today, remains with constipation.  Discharge Exam: Vitals:   11/30/22 0500 11/30/22 0800  BP: (!) 142/69 113/67  Pulse: 71 71  Resp: 18 20  Temp: 98.6 F (37 C) 97.7 F (36.5 C)  SpO2: 96% 95%   Vitals:   11/29/22 2354 11/30/22 0400 11/30/22 0500 11/30/22 0800  BP: 134/69 (!) 154/59 (!) 142/69 113/67  Pulse: 66 72 71 71  Resp: 18 17 18 20   Temp: 97.6 F (36.4 C)  98.6 F (37 C) 97.7 F (36.5 C)  TempSrc: Oral Axillary Oral Oral  SpO2: 97% 97% 96% 95%  Weight:   75 kg   Height:        General: Pt is alert, awake, not in acute distress, extremely frail, deconditioned, chronically ill-appearing, poor cognition and insight Cardiovascular: RRR, S1/S2 +, no rubs, no gallops Respiratory: CTA bilaterally, no  wheezing, no rhonchi Abdominal: Soft, NT, ND, bowel sounds + Extremities: no edema, no cyanosis        The results of significant diagnostics from this hospitalization (including imaging, microbiology, ancillary and laboratory) are listed below for reference.     Microbiology: Recent Results (from the past 240 hour(s))  Blood Culture (routine x 2)     Status: Abnormal   Collection Time: 11/24/22  3:53 PM   Specimen: BLOOD  Result Value Ref Range Status   Specimen Description   Final    BLOOD BLOOD RIGHT HAND Performed at [facility], [address] [place], [place] [address]    Special Requests   Final    BOTTLES DRAWN AEROBIC AND ANAEROBIC Blood Culture results may not be optimal due to an excessive volume of blood received in culture bottles Performed at [facility], [address], [place], [place] [address]    Culture  Setup Time   Final    IN BOTH AEROBIC AND ANAEROBIC BOTTLES GRAM NEGATIVE RODS Gram Stain Report Called to,Read Back By and Verified With: MEZA, RAMON @ [phone] ON 11/25/2022 BY FRATTO,ASHLEY CRITICAL RESULT CALLED TO, READ BACK BY AND VERIFIED WITH: PHARMD S MOODY [id number] AT 1410 BY CM    Culture (A)  Final    PROTEUS MIRABILIS SUSCEPTIBILITIES PERFORMED ON PREVIOUS CULTURE WITHIN THE LAST 5 DAYS. Performed at [facility], [address] [place], [place] [address]    Report Status 11/27/2022 FINAL  Final  Blood Culture ID Panel (Reflexed)  Status: Abnormal   Collection Time: 11/24/22  3:53 PM  Result Value Ref Range Status   Enterococcus faecalis NOT DETECTED NOT DETECTED Final   Enterococcus Faecium NOT DETECTED NOT DETECTED Final   Listeria monocytogenes NOT DETECTED NOT DETECTED Final   Staphylococcus species NOT DETECTED NOT DETECTED Final   Staphylococcus aureus (BCID) NOT DETECTED NOT DETECTED Final   Staphylococcus epidermidis NOT DETECTED NOT DETECTED Final   Staphylococcus lugdunensis NOT DETECTED NOT DETECTED Final   Streptococcus  species NOT DETECTED NOT DETECTED Final   Streptococcus agalactiae NOT DETECTED NOT DETECTED Final   Streptococcus pneumoniae NOT DETECTED NOT DETECTED Final   Streptococcus pyogenes NOT DETECTED NOT DETECTED Final   A.calcoaceticus-baumannii NOT DETECTED NOT DETECTED Final   Bacteroides fragilis NOT DETECTED NOT DETECTED Final   Enterobacterales DETECTED (A) NOT DETECTED Final    Comment: Enterobacterales represent a large order of gram negative bacteria, not a single organism. CRITICAL RESULT CALLED TO, READ BACK BY AND VERIFIED WITH: PHARMD S MOODY [id number] AT 1410 BY CM    Enterobacter cloacae complex NOT DETECTED NOT DETECTED Final   Escherichia coli NOT DETECTED NOT DETECTED Final   Klebsiella aerogenes NOT DETECTED NOT DETECTED Final   Klebsiella oxytoca NOT DETECTED NOT DETECTED Final   Klebsiella pneumoniae NOT DETECTED NOT DETECTED Final   Proteus species DETECTED (A) NOT DETECTED Final    Comment: CRITICAL RESULT CALLED TO, READ BACK BY AND VERIFIED WITH: PHARMD S MOODY [id number] AT 1410 BY CM    Salmonella species NOT DETECTED NOT DETECTED Final   Serratia marcescens NOT DETECTED NOT DETECTED Final   Haemophilus influenzae NOT DETECTED NOT DETECTED Final   Neisseria meningitidis NOT DETECTED NOT DETECTED Final   Pseudomonas aeruginosa NOT DETECTED NOT DETECTED Final   Stenotrophomonas maltophilia NOT DETECTED NOT DETECTED Final   Candida albicans NOT DETECTED NOT DETECTED Final   Candida auris NOT DETECTED NOT DETECTED Final   Candida glabrata NOT DETECTED NOT DETECTED Final   Candida krusei NOT DETECTED NOT DETECTED Final   Candida parapsilosis NOT DETECTED NOT DETECTED Final   Candida tropicalis NOT DETECTED NOT DETECTED Final   Cryptococcus neoformans/gattii NOT DETECTED NOT DETECTED Final   CTX-M ESBL NOT DETECTED NOT DETECTED Final   Carbapenem resistance IMP NOT DETECTED NOT DETECTED Final   Carbapenem resistance KPC NOT DETECTED NOT DETECTED Final   Carbapenem  resistance NDM NOT DETECTED NOT DETECTED Final   Carbapenem resist OXA 48 LIKE NOT DETECTED NOT DETECTED Final   Carbapenem resistance VIM NOT DETECTED NOT DETECTED Final    Comment: Performed at [facility], [address], [place], [place] [address]  Blood Culture (routine x 2)     Status: Abnormal   Collection Time: 11/24/22  4:36 PM   Specimen: BLOOD  Result Value Ref Range Status   Specimen Description BLOOD LEFT ANTECUBITAL  Final   Special Requests   Final    BOTTLES DRAWN AEROBIC AND ANAEROBIC Blood Culture adequate volume   Culture  Setup Time   Final    IN BOTH AEROBIC AND ANAEROBIC BOTTLES GRAM NEGATIVE RODS Gram Stain Report Called to,Read Back By and Verified With: MEZA, RAMON @ [phone] ON 11/25/2022 BY FRATTO,ASHLEY CRITICAL VALUE NOTED.  VALUE IS CONSISTENT WITH PREVIOUSLY REPORTED AND CALLED VALUE.    Culture PROTEUS MIRABILIS (A)  Final   Report Status 11/27/2022 FINAL  Final   Organism ID, Bacteria PROTEUS MIRABILIS  Final      Susceptibility   Proteus mirabilis - MIC*  AMPICILLIN <=2 SENSITIVE Sensitive     CEFTAZIDIME <=1 SENSITIVE Sensitive     CEFTRIAXONE <=0.25 SENSITIVE Sensitive     CIPROFLOXACIN >=4 RESISTANT Resistant     GENTAMICIN <=1 SENSITIVE Sensitive     IMIPENEM 1 SENSITIVE Sensitive     TRIMETH/SULFA <=20 SENSITIVE Sensitive     AMPICILLIN/SULBACTAM <=2 SENSITIVE Sensitive     PIP/TAZO <=4 SENSITIVE Sensitive     * PROTEUS MIRABILIS  Urine Culture     Status: Abnormal   Collection Time: 11/24/22  5:00 PM   Specimen: Urine, Random  Result Value Ref Range Status   Specimen Description   Final    URINE, RANDOM Performed at [facility], [address] [place], [place] [address]    Special Requests   Final    NONE Reflexed from [id number] Performed at [facility], [address], [place], [place] [address]    Culture >=100,000 COLONIES/mL PROTEUS MIRABILIS (A)  Final   Report Status 11/26/2022 FINAL  Final   Organism ID, Bacteria PROTEUS  MIRABILIS (A)  Final      Susceptibility   Proteus mirabilis - MIC*    AMPICILLIN 4 SENSITIVE Sensitive     CEFAZOLIN <=4 SENSITIVE Sensitive     CEFEPIME <=0.12 SENSITIVE Sensitive     CEFTRIAXONE <=0.25 SENSITIVE Sensitive     CIPROFLOXACIN >=4 RESISTANT Resistant     GENTAMICIN <=1 SENSITIVE Sensitive     IMIPENEM 1 SENSITIVE Sensitive     NITROFURANTOIN 256 RESISTANT Resistant     TRIMETH/SULFA >=320 RESISTANT Resistant     AMPICILLIN/SULBACTAM <=2 SENSITIVE Sensitive     PIP/TAZO <=4 SENSITIVE Sensitive     * >=100,000 COLONIES/mL PROTEUS MIRABILIS  Resp panel by RT-PCR (RSV, Flu A&B, Covid) Anterior Nasal Swab     Status: None   Collection Time: 11/24/22  5:08 PM   Specimen: Anterior Nasal Swab  Result Value Ref Range Status   SARS Coronavirus 2 by RT PCR NEGATIVE NEGATIVE Final    Comment: (NOTE) SARS-CoV-2 target nucleic acids are NOT DETECTED.  The SARS-CoV-2 RNA is generally detectable in upper respiratory specimens during the acute phase of infection. The lowest concentration of SARS-CoV-2 viral copies this assay can detect is 138 copies/mL. A negative result does not preclude SARS-Cov-2 infection and should not be used as the sole basis for treatment or other patient management decisions. A negative result may occur with  improper specimen collection/handling, submission of specimen other than nasopharyngeal swab, presence of viral mutation(s) within the areas targeted by this assay, and inadequate number of viral copies(<138 copies/mL). A negative result must be combined with clinical observations, patient history, and epidemiological information. The expected result is Negative.  Fact Sheet for Patients:  BloggerCourse.com  Fact Sheet for Healthcare Providers:  SeriousBroker.it  This test is no t yet approved or cleared by the [place] FDA and  has been authorized for detection and/or diagnosis of  SARS-CoV-2 by FDA under an Emergency Use Authorization (EUA). This EUA will remain  in effect (meaning this test can be used) for the duration of the COVID-19 declaration under Section 564(b)(1) of the Act, 21 U.S.C.section 360bbb-3(b)(1), unless the authorization is terminated  or revoked sooner.       Influenza A by PCR NEGATIVE NEGATIVE Final   Influenza B by PCR NEGATIVE NEGATIVE Final    Comment: (NOTE) The Xpert Xpress SARS-CoV-2/FLU/RSV plus assay is intended as an aid in the diagnosis of influenza from Nasopharyngeal swab specimens and should not be used  as a sole basis for treatment. Nasal washings and aspirates are unacceptable for Xpert Xpress SARS-CoV-2/FLU/RSV testing.  Fact Sheet for Patients: BloggerCourse.com  Fact Sheet for Healthcare Providers: SeriousBroker.it  This test is not yet approved or cleared by the [place] FDA and has been authorized for detection and/or diagnosis of SARS-CoV-2 by FDA under an Emergency Use Authorization (EUA). This EUA will remain in effect (meaning this test can be used) for the duration of the COVID-19 declaration under Section 564(b)(1) of the Act, 21 U.S.C. section 360bbb-3(b)(1), unless the authorization is terminated or revoked.     Resp Syncytial Virus by PCR NEGATIVE NEGATIVE Final    Comment: (NOTE) Fact Sheet for Patients: BloggerCourse.com  Fact Sheet for Healthcare Providers: SeriousBroker.it  This test is not yet approved or cleared by the [place] FDA and has been authorized for detection and/or diagnosis of SARS-CoV-2 by FDA under an Emergency Use Authorization (EUA). This EUA will remain in effect (meaning this test can be used) for the duration of the COVID-19 declaration under Section 564(b)(1) of the Act, 21 U.S.C. section 360bbb-3(b)(1), unless the authorization is terminated  or revoked.  Performed at [facility], [address], [place], [place] [address]   MRSA Next Gen by PCR, Nasal     Status: None   Collection Time: 11/24/22  9:27 PM   Specimen: Nasal Mucosa; Nasal Swab  Result Value Ref Range Status   MRSA by PCR Next Gen NOT DETECTED NOT DETECTED Final    Comment: (NOTE) The GeneXpert MRSA Assay (FDA approved for NASAL specimens only), is one component of a comprehensive MRSA colonization surveillance program. It is not intended to diagnose MRSA infection nor to guide or monitor treatment for MRSA infections. Test performance is not FDA approved in patients less than 22 years old. Performed at [facility], [address], [place], [place] [address]      Labs: BNP (last 3 results) Recent Labs    08/24/22 1542 09/22/22 1237  BNP 160.0* 196.2*   Basic Metabolic Panel: Recent Labs  Lab 11/24/22 2147 11/25/22 0848 11/25/22 1317 11/26/22 0853 11/27/22 0057 11/28/22 0159 11/30/22 0626  NA 133* 134* 134* 134* 134* 136 140  K 4.9 6.3* 4.2 3.6 3.7 3.9 4.4  CL 100 101 97* 99 99 101 103  CO2 18* 19* 24 25 23 25 26   GLUCOSE 95 97 121* 81 118* 114* 108*  BUN 60* 70* 64* 67* 66* 74* 63*  CREATININE 3.67* 3.75* 3.74* 3.32* 3.01* 2.09* 1.15  CALCIUM 7.8* 7.5* 7.8* 7.7* 7.9* 8.2* 8.5*  MG 1.8 1.9  --  1.8 1.8  --   --   PHOS 4.5 5.1*  --   --   --   --   --    Liver Function Tests: Recent Labs  Lab 11/24/22 1553 11/24/22 2147  AST 22 34  ALT 15 17  ALKPHOS 102 95  BILITOT 1.1 1.5*  PROT 5.5* 5.0*  ALBUMIN 1.9* 1.8*   No results for input(s): "LIPASE", "AMYLASE" in the last 168 hours. No results for input(s): "AMMONIA" in the last 168 hours. CBC: Recent Labs  Lab 11/24/22 1553 11/24/22 1801 11/26/22 0853 11/27/22 0057 11/28/22 0159 11/29/22 1415 11/30/22 0626  WBC 39.0*   < > 21.4* 16.0* 10.0 11.5* 10.2  NEUTROABS 35.5*  --   --   --   --   --   --   HGB 6.0*   < > 8.1* 8.8* 8.1* 8.8* 8.9*  HCT 18.6*   < > 24.6*  26.8* 26.0* 28.2* 28.2*  MCV 108.1*   < > 96.5 99.6 99.2 102.9* 100.7*  PLT 398   < > 292 271 267 261 260   < > = values in this interval not displayed.   Cardiac Enzymes: No results for input(s): "CKTOTAL", "CKMB", "CKMBINDEX", "TROPONINI" in the last 168 hours. BNP: Invalid input(s): "POCBNP" CBG: Recent Labs  Lab 11/25/22 2326 11/28/22 0841 11/28/22 0905 11/28/22 1625 11/29/22 0744  GLUCAP 101* 90 104* 107* 105*   D-Dimer No results for input(s): "DDIMER" in the last 72 hours. Hgb A1c No results for input(s): "HGBA1C" in the last 72 hours. Lipid Profile No results for input(s): "CHOL", "HDL", "LDLCALC", "TRIG", "CHOLHDL", "LDLDIRECT" in the last 72 hours. Thyroid function studies No results for input(s): "TSH", "T4TOTAL", "T3FREE", "THYROIDAB" in the last 72 hours.  Invalid input(s): "FREET3" Anemia work up Recent Labs    11/28/22 0801  VITAMINB12 1,453*   Urinalysis    Component Value Date/Time   COLORURINE AMBER (A) 11/24/2022 1700   APPEARANCEUR TURBID (A) 11/24/2022 1700   APPEARANCEUR Clear 07/06/2022 1303   LABSPEC 1.009 11/24/2022 1700   PHURINE 8.0 11/24/2022 1700   GLUCOSEU NEGATIVE 11/24/2022 1700   HGBUR MODERATE (A) 11/24/2022 1700   BILIRUBINUR NEGATIVE 11/24/2022 1700   BILIRUBINUR Negative 07/06/2022 1303   KETONESUR NEGATIVE 11/24/2022 1700   PROTEINUR >=300 (A) 11/24/2022 1700   UROBILINOGEN 0.2 01/19/2010 1950   NITRITE NEGATIVE 11/24/2022 1700   LEUKOCYTESUR LARGE (A) 11/24/2022 1700   Sepsis Labs Recent Labs  Lab 11/27/22 0057 11/28/22 0159 11/29/22 1415 11/30/22 0626  WBC 16.0* 10.0 11.5* 10.2   Microbiology Recent Results (from the past 240 hour(s))  Blood Culture (routine x 2)     Status: Abnormal   Collection Time: 11/24/22  3:53 PM   Specimen: BLOOD  Result Value Ref Range Status   Specimen Description   Final    BLOOD BLOOD RIGHT HAND Performed at [facility], [address], [place], [place] [address]    Special  Requests   Final    BOTTLES DRAWN AEROBIC AND ANAEROBIC Blood Culture results may not be optimal due to an excessive volume of blood received in culture bottles Performed at [facility], [address], [place], [place] [address]    Culture  Setup Time   Final    IN BOTH AEROBIC AND ANAEROBIC BOTTLES GRAM NEGATIVE RODS Gram Stain Report Called to,Read Back By and Verified With: MEZA, RAMON @ [phone] ON 11/25/2022 BY FRATTO,ASHLEY CRITICAL RESULT CALLED TO, READ BACK BY AND VERIFIED WITH: PHARMD S MOODY [id number] AT 1410 BY CM    Culture (A)  Final    PROTEUS MIRABILIS SUSCEPTIBILITIES PERFORMED ON PREVIOUS CULTURE WITHIN THE LAST 5 DAYS. Performed at [facility], [address] [place], [place] [address]    Report Status 11/27/2022 FINAL  Final  Blood Culture ID Panel (Reflexed)     Status: Abnormal   Collection Time: 11/24/22  3:53 PM  Result Value Ref Range Status   Enterococcus faecalis NOT DETECTED NOT DETECTED Final   Enterococcus Faecium NOT DETECTED NOT DETECTED Final   Listeria monocytogenes NOT DETECTED NOT DETECTED Final   Staphylococcus species NOT DETECTED NOT DETECTED Final   Staphylococcus aureus (BCID) NOT DETECTED NOT DETECTED Final   Staphylococcus epidermidis NOT DETECTED NOT DETECTED Final   Staphylococcus lugdunensis NOT DETECTED NOT DETECTED Final   Streptococcus species NOT DETECTED NOT DETECTED Final   Streptococcus  agalactiae NOT DETECTED NOT DETECTED Final   Streptococcus pneumoniae NOT DETECTED NOT DETECTED Final   Streptococcus pyogenes NOT DETECTED NOT DETECTED Final   A.calcoaceticus-baumannii NOT DETECTED NOT DETECTED Final   Bacteroides fragilis NOT DETECTED NOT DETECTED Final   Enterobacterales DETECTED (A) NOT DETECTED Final    Comment: Enterobacterales represent a large order of gram negative bacteria, not a single organism. CRITICAL RESULT CALLED TO, READ BACK BY AND VERIFIED WITH: PHARMD S MOODY [id number] AT 1410 BY CM    Enterobacter cloacae  complex NOT DETECTED NOT DETECTED Final   Escherichia coli NOT DETECTED NOT DETECTED Final   Klebsiella aerogenes NOT DETECTED NOT DETECTED Final   Klebsiella oxytoca NOT DETECTED NOT DETECTED Final   Klebsiella pneumoniae NOT DETECTED NOT DETECTED Final   Proteus species DETECTED (A) NOT DETECTED Final    Comment: CRITICAL RESULT CALLED TO, READ BACK BY AND VERIFIED WITH: PHARMD S MOODY [id number] AT 1410 BY CM    Salmonella species NOT DETECTED NOT DETECTED Final   Serratia marcescens NOT DETECTED NOT DETECTED Final   Haemophilus influenzae NOT DETECTED NOT DETECTED Final   Neisseria meningitidis NOT DETECTED NOT DETECTED Final   Pseudomonas aeruginosa NOT DETECTED NOT DETECTED Final   Stenotrophomonas maltophilia NOT DETECTED NOT DETECTED Final   Candida albicans NOT DETECTED NOT DETECTED Final   Candida auris NOT DETECTED NOT DETECTED Final   Candida glabrata NOT DETECTED NOT DETECTED Final   Candida krusei NOT DETECTED NOT DETECTED Final   Candida parapsilosis NOT DETECTED NOT DETECTED Final   Candida tropicalis NOT DETECTED NOT DETECTED Final   Cryptococcus neoformans/gattii NOT DETECTED NOT DETECTED Final   CTX-M ESBL NOT DETECTED NOT DETECTED Final   Carbapenem resistance IMP NOT DETECTED NOT DETECTED Final   Carbapenem resistance KPC NOT DETECTED NOT DETECTED Final   Carbapenem resistance NDM NOT DETECTED NOT DETECTED Final   Carbapenem resist OXA 48 LIKE NOT DETECTED NOT DETECTED Final   Carbapenem resistance VIM NOT DETECTED NOT DETECTED Final    Comment: Performed at [facility], [address], [place], [place] [address]  Blood Culture (routine x 2)     Status: Abnormal   Collection Time: 11/24/22  4:36 PM   Specimen: BLOOD  Result Value Ref Range Status   Specimen Description BLOOD LEFT ANTECUBITAL  Final   Special Requests   Final    BOTTLES DRAWN AEROBIC AND ANAEROBIC Blood Culture adequate volume   Culture  Setup Time   Final    IN BOTH AEROBIC AND ANAEROBIC  BOTTLES GRAM NEGATIVE RODS Gram Stain Report Called to,Read Back By and Verified With: MEZA, RAMON @ [phone] ON 11/25/2022 BY FRATTO,ASHLEY CRITICAL VALUE NOTED.  VALUE IS CONSISTENT WITH PREVIOUSLY REPORTED AND CALLED VALUE.    Culture PROTEUS MIRABILIS (A)  Final   Report Status 11/27/2022 FINAL  Final   Organism ID, Bacteria PROTEUS MIRABILIS  Final      Susceptibility   Proteus mirabilis - MIC*    AMPICILLIN <=2 SENSITIVE Sensitive     CEFTAZIDIME <=1 SENSITIVE Sensitive     CEFTRIAXONE <=0.25 SENSITIVE Sensitive     CIPROFLOXACIN >=4 RESISTANT Resistant     GENTAMICIN <=1 SENSITIVE Sensitive     IMIPENEM 1 SENSITIVE Sensitive     TRIMETH/SULFA <=20 SENSITIVE Sensitive     AMPICILLIN/SULBACTAM <=2 SENSITIVE Sensitive     PIP/TAZO <=4 SENSITIVE Sensitive     * PROTEUS MIRABILIS  Urine Culture     Status: Abnormal   Collection Time: 11/24/22  5:00 PM   Specimen: Urine, Random  Result Value Ref Range Status   Specimen Description   Final    URINE, RANDOM Performed at [facility], [address] [place], [place] [address]    Special Requests   Final    NONE Reflexed from [id number] Performed at [facility], [address] [place], [place] [address]    Culture >=100,000 COLONIES/mL PROTEUS MIRABILIS (A)  Final   Report Status 11/26/2022 FINAL  Final   Organism ID, Bacteria PROTEUS MIRABILIS (A)  Final      Susceptibility   Proteus mirabilis - MIC*    AMPICILLIN 4 SENSITIVE Sensitive     CEFAZOLIN <=4 SENSITIVE Sensitive     CEFEPIME <=0.12 SENSITIVE Sensitive     CEFTRIAXONE <=0.25 SENSITIVE Sensitive     CIPROFLOXACIN >=4 RESISTANT Resistant     GENTAMICIN <=1 SENSITIVE Sensitive     IMIPENEM 1 SENSITIVE Sensitive     NITROFURANTOIN 256 RESISTANT Resistant     TRIMETH/SULFA >=320 RESISTANT Resistant     AMPICILLIN/SULBACTAM <=2 SENSITIVE Sensitive     PIP/TAZO <=4 SENSITIVE Sensitive     * >=100,000 COLONIES/mL PROTEUS MIRABILIS  Resp panel by RT-PCR (RSV, Flu A&B, Covid)  Anterior Nasal Swab     Status: None   Collection Time: 11/24/22  5:08 PM   Specimen: Anterior Nasal Swab  Result Value Ref Range Status   SARS Coronavirus 2 by RT PCR NEGATIVE NEGATIVE Final    Comment: (NOTE) SARS-CoV-2 target nucleic acids are NOT DETECTED.  The SARS-CoV-2 RNA is generally detectable in upper respiratory specimens during the acute phase of infection. The lowest concentration of SARS-CoV-2 viral copies this assay can detect is 138 copies/mL. A negative result does not preclude SARS-Cov-2 infection and should not be used as the sole basis for treatment or other patient management decisions. A negative result may occur with  improper specimen collection/handling, submission of specimen other than nasopharyngeal swab, presence of viral mutation(s) within the areas targeted by this assay, and inadequate number of viral copies(<138 copies/mL). A negative result must be combined with clinical observations, patient history, and epidemiological information. The expected result is Negative.  Fact Sheet for Patients:  BloggerCourse.com  Fact Sheet for Healthcare Providers:  SeriousBroker.it  This test is no t yet approved or cleared by the [place] FDA and  has been authorized for detection and/or diagnosis of SARS-CoV-2 by FDA under an Emergency Use Authorization (EUA). This EUA will remain  in effect (meaning this test can be used) for the duration of the COVID-19 declaration under Section 564(b)(1) of the Act, 21 U.S.C.section 360bbb-3(b)(1), unless the authorization is terminated  or revoked sooner.       Influenza A by PCR NEGATIVE NEGATIVE Final   Influenza B by PCR NEGATIVE NEGATIVE Final    Comment: (NOTE) The Xpert Xpress SARS-CoV-2/FLU/RSV plus assay is intended as an aid in the diagnosis of influenza from Nasopharyngeal swab specimens and should not be used as a sole basis for treatment. Nasal washings  and aspirates are unacceptable for Xpert Xpress SARS-CoV-2/FLU/RSV testing.  Fact Sheet for Patients: BloggerCourse.com  Fact Sheet for Healthcare Providers: SeriousBroker.it  This test is not yet approved or cleared by the [place] FDA and has been authorized for detection and/or diagnosis of SARS-CoV-2 by FDA under an Emergency Use Authorization (EUA). This EUA will remain in effect (meaning this test can be used) for the duration of the COVID-19 declaration under Section 564(b)(1) of the Act, 21 U.S.C. section  360bbb-3(b)(1), unless the authorization is terminated or revoked.     Resp Syncytial Virus by PCR NEGATIVE NEGATIVE Final    Comment: (NOTE) Fact Sheet for Patients: BloggerCourse.com  Fact Sheet for Healthcare Providers: SeriousBroker.it  This test is not yet approved or cleared by the [place] FDA and has been authorized for detection and/or diagnosis of SARS-CoV-2 by FDA under an Emergency Use Authorization (EUA). This EUA will remain in effect (meaning this test can be used) for the duration of the COVID-19 declaration under Section 564(b)(1) of the Act, 21 U.S.C. section 360bbb-3(b)(1), unless the authorization is terminated or revoked.  Performed at [facility], [address], [place], [place] [address]   MRSA Next Gen by PCR, Nasal     Status: None   Collection Time: 11/24/22  9:27 PM   Specimen: Nasal Mucosa; Nasal Swab  Result Value Ref Range Status   MRSA by PCR Next Gen NOT DETECTED NOT DETECTED Final    Comment: (NOTE) The GeneXpert MRSA Assay (FDA approved for NASAL specimens only), is one component of a comprehensive MRSA colonization surveillance program. It is not intended to diagnose MRSA infection nor to guide or monitor treatment for MRSA infections. Test performance is not FDA approved in patients less than 68 years old. Performed  at [facility], [address] [place], [place] [address]      Time coordinating discharge: Over 30 minutes  SIGNED:   Huey Bienenstock, MD  [facility] 11/30/2022, 9:57 AM Pager   If 7PM-7AM, please contact night-coverage [email or web address] Password [email or web address]

## 2022-11-30 NOTE — Progress Notes (Signed)
Occupational Therapy Treatment Patient Details Name: [name]. MRN: [id number] DOB: November 09, 1928 Today's Date: 11/30/2022   History of present illness 89 yo male adm 8/7 with hematuria, encephalopathy, sepsis. PMhx: prostate CA, BPH, chronic foley, HTN, Rt 5th ray amp, PAD, Afib   OT comments  Pt making slow progress with functional goals. Pt in bed, therapist elevated HOB to 75 degrees. Pt able to flex R elbow to bring hand to mouth, nose and forehead with no assist. Pt reports that he has difficulty feeding himself and that he drops utensils. Pt able to bring cup to mouth with min A to prevent spillage. Pt trialed with red tubing on spoon to eat applesauce and pt required min A to maintain grip. Pt's family planning for pt to d/c to a LTC facility and pt may continue to need ongoing assist with feeding      If plan is discharge home, recommend the following:  Two people to help with walking and/or transfers;Two people to help with bathing/dressing/bathroom;Assistance with cooking/housework;Direct supervision/assist for medications management;Assistance with feeding;Direct supervision/assist for financial management;Assist for transportation;Help with stairs or ramp for entrance;Other (comment) (pt may need ongoing assist with feeding)   Equipment Recommendations  Hospital bed;Hoyer lift;Wheelchair (measurements OT);Wheelchair cushion (measurements OT)    Recommendations for Other Services      Precautions / Restrictions Precautions Precautions: Fall;Other (comment) Precaution Comments: B heel wounds, boots Restrictions Weight Bearing Restrictions: No       Mobility Bed Mobility                    Transfers                         Balance                                           ADL either performed or assessed with clinical judgement   ADL     Eating/Feeding Details (indicate cue type and reason): Pt in bed with HOB elevated to 75  degrees.Pt able to flex R elbow to bring hand to mouth, nose and forehead with no assist. Pt reports that he has difficulty feeding himself and that he drops utensils. Pt able to bring cup to mouth with min A to prevent spillage. Pt trialed with red tubing on spoon to eat applesauce and pt required min A to maintain grip Grooming: Wash/dry hands;Wash/dry face;Minimal assistance                                 General ADL Comments: Pt otherwise total A for ADL at this time.    Extremity/Trunk Assessment Upper Extremity Assessment RUE Deficits / Details: Pt able to flex R elbow to bring hand to mouth, nose and forehead with no assist. Pt reports that he has difficulty feeding himself and that he drops utensils   Lower Extremity Assessment Lower Extremity Assessment: Defer to PT evaluation        Vision Ability to See in Adequate Light: 0 Adequate Patient Visual Report: No change from baseline     Perception     Praxis      Cognition Arousal: Alert Behavior During Therapy: Flat affect Overall Cognitive Status: Impaired/Different from baseline  Safety/Judgement: Decreased awareness of deficits              Exercises Other Exercises Other Exercises: pt participated in R hand grip exercises with squeezing damp washcloth    Shoulder Instructions       General Comments      Pertinent Vitals/ Pain       Pain Assessment Pain Assessment: Faces Pain Score: 0-No pain Pain Intervention(s): Monitored during session  Home Living                                          Prior Functioning/Environment              Frequency  Min 1X/week        Progress Toward Goals  OT Goals(current goals can now be found in the care plan section)  Progress towards OT goals: Progressing toward goals     Plan      Co-evaluation                 AM-PAC OT "6 Clicks" Daily Activity     Outcome  Measure   Help from another person eating meals?: None Help from another person taking care of personal grooming?: A Little Help from another person toileting, which includes using toliet, bedpan, or urinal?: Total Help from another person bathing (including washing, rinsing, drying)?: Total Help from another person to put on and taking off regular upper body clothing?: Total Help from another person to put on and taking off regular lower body clothing?: Total 6 Click Score: 11    End of Session    OT Visit Diagnosis: Muscle weakness (generalized) (M62.81);Other symptoms and signs involving cognitive function   Activity Tolerance Patient tolerated treatment well   Patient Left in bed;with call bell/phone within reach;with bed alarm set   Nurse Communication          Time: 1610-9604 OT Time Calculation (min): 19 min  Charges: OT General Charges $OT Visit: 1 Visit OT Treatments $Self Care/Home Management : 8-22 mins    Galen Manila 11/30/2022, 2:35 PM

## 2022-11-30 NOTE — TOC Transition Note (Signed)
Transition of Care [facility]) - CM/SW Discharge Note   Patient Details  Name: Jesse CROFTON Sr. MRN: [id number] Date of Birth: 1928/05/03  [facility] CM/SW Contact:  Mearl Latin, LCSW Phone Number: 11/30/2022, 12:38 PM   Clinical Narrative:    Patient will DC to: [facility] SNF Anticipated DC date: 11/30/22 Family notified: Daughter Transport by: Sharin Mons   Per MD patient ready for DC to [facility]. RN to call report prior to discharge [phone] room B3-1). RN, patient, patient's family, and facility notified of DC. Discharge Summary and FL2 sent to facility. DC packet on chart. Ambulance transport requested for patient.   CSW will sign off for now as social work intervention is no longer needed. Please consult [place] again if new needs arise.     Final next level of care: Skilled Nursing Facility Barriers to Discharge: Barriers Resolved   Patient Goals and CMS Choice CMS Medicare.gov Compare Post Acute Care list provided to:: Patient Choice offered to / list presented to : Patient  Discharge Placement     Existing PASRR number confirmed : 11/30/22          Patient chooses bed at:  [facility]) Patient to be transferred to facility by: [facility] Name of family member notified: Daughter Patient and family notified of of transfer: 11/30/22  Discharge Plan and Services Additional resources added to the After Visit Summary for   In-house Referral: Clinical Social Work   Post Acute Care Choice: Skilled Nursing Facility                               Social Determinants of Health (SDOH) Interventions SDOH Screenings   Food Insecurity: No Food Insecurity (08/25/2022)  Housing: Low Risk  (08/25/2022)  Transportation Needs: No Transportation Needs (08/25/2022)  Utilities: Not At Risk (08/25/2022)  Alcohol Screen: Low Risk  (07/24/2019)  Depression (PHQ2-9): Low Risk  (07/24/2019)  Financial Resource Strain: Low Risk  (07/24/2019)  Physical Activity: Inactive  (07/24/2019)  Social Connections: Moderately Integrated (07/24/2019)  Stress: No Stress Concern Present (07/24/2019)  Tobacco Use: Low Risk  (11/24/2022)     Readmission Risk Interventions    11/29/2022    4:25 PM  Readmission Risk Prevention Plan  Transportation Screening Complete  Medication Review (RN Care Manager) Complete  PCP or Specialist appointment within 3-5 days of discharge Complete  HRI or Home Care Consult Complete  SW Recovery Care/Counseling Consult Complete  Palliative Care Screening Complete  Skilled Nursing Facility Complete

## 2022-11-30 NOTE — Discharge Instructions (Addendum)
Follow with Primary MD Elfredia Nevins, MD /SNF physician  Get CBC, CMP,  checked  by Primary MD next visit.    Activity: As tolerated with Full fall precautions use walker/cane & assistance as needed   Disposition SNF  Diet: regular   On your next visit with your primary care physician please Get Medicines reviewed and adjusted.   Please request your Prim.MD to go over all Hospital Tests and Procedure/Radiological results at the follow up, please get all Hospital records sent to your Prim MD by signing hospital release before you go home.   If you experience worsening of your admission symptoms, develop shortness of breath, life threatening emergency, suicidal or homicidal thoughts you must seek medical attention immediately by calling 911 or calling your MD immediately  if symptoms less severe.  You Must read complete instructions/literature along with all the possible adverse reactions/side effects for all the Medicines you take and that have been prescribed to you. Take any new Medicines after you have completely understood and accpet all the possible adverse reactions/side effects.   Do not drive, operating heavy machinery, perform activities at heights, swimming or participation in water activities or provide baby sitting services if your were admitted for syncope or siezures until you have seen by Primary MD or a Neurologist and advised to do so again.  Do not drive when taking Pain medications.    Do not take more than prescribed Pain, Sleep and Anxiety Medications  Special Instructions: If you have smoked or chewed Tobacco  in the last 2 yrs please stop smoking, stop any regular Alcohol  and or any Recreational drug use.  Wear Seat belts while driving.   Please note  You were cared for by a hospitalist during your hospital stay. If you have any questions about your discharge medications or the care you received while you were in the hospital after you are discharged, you  can call the unit and asked to speak with the hospitalist on call if the hospitalist that took care of you is not available. Once you are discharged, your primary care physician will handle any further medical issues. Please note that NO REFILLS for any discharge medications will be authorized once you are discharged, as it is imperative that you return to your primary care physician (or establish a relationship with a primary care physician if you do not have one) for your aftercare needs so that they can reassess your need for medications and monitor your lab values.

## 2022-11-30 NOTE — Plan of Care (Signed)
  Problem: Clinical Measurements: Goal: Will remain free from infection Outcome: Progressing   Problem: Nutrition: Goal: Adequate nutrition will be maintained Outcome: Progressing   Problem: Safety: Goal: Ability to remain free from injury will improve Outcome: Progressing   Problem: Skin Integrity: Goal: Risk for impaired skin integrity will decrease Outcome: Progressing   

## 2022-11-30 NOTE — TOC Progression Note (Signed)
Transition of Care (TOC) - Progression Note    Patient Details  Name: Jesse ALAMILLO Sr. MRN: [id number] Date of Birth: March 04, 1929  Transition of Care [facility]) CM/SW Contact  Mearl Latin, LCSW Phone Number: 11/30/2022, 9:16 AM  Clinical Narrative:    CSW updated [facility] and patient's daughter, Windell Moulding, that patient is ready for discharge. Windell Moulding reported being in agreement with [facility] for transport.    Expected Discharge Plan: Skilled Nursing Facility Barriers to Discharge: Barriers Resolved  Expected Discharge Plan and Services In-house Referral: Clinical Social Work   Post Acute Care Choice: Skilled Nursing Facility Living arrangements for the past 2 months: Skilled Nursing Facility Expected Discharge Date: 11/30/22                                     Social Determinants of Health (SDOH) Interventions SDOH Screenings   Food Insecurity: No Food Insecurity (08/25/2022)  Housing: Low Risk  (08/25/2022)  Transportation Needs: No Transportation Needs (08/25/2022)  Utilities: Not At Risk (08/25/2022)  Alcohol Screen: Low Risk  (07/24/2019)  Depression (PHQ2-9): Low Risk  (07/24/2019)  Financial Resource Strain: Low Risk  (07/24/2019)  Physical Activity: Inactive (07/24/2019)  Social Connections: Moderately Integrated (07/24/2019)  Stress: No Stress Concern Present (07/24/2019)  Tobacco Use: Low Risk  (11/24/2022)    Readmission Risk Interventions    11/29/2022    4:25 PM  Readmission Risk Prevention Plan  Transportation Screening Complete  Medication Review (RN Care Manager) Complete  PCP or Specialist appointment within 3-5 days of discharge Complete  HRI or Home Care Consult Complete  SW Recovery Care/Counseling Consult Complete  Palliative Care Screening Complete  Skilled Nursing Facility Complete

## 2022-11-30 NOTE — Progress Notes (Signed)
Attempted report to [facility], was placed on hold for 5 minutes. Will call back shortly.

## 2022-11-30 NOTE — Progress Notes (Signed)
Daily Progress Note   Patient Name: Jesse ZHANG Sr.       Date: 11/30/2022 DOB: [birth year]  Age: 89 y.o. MRN#: [id number] Attending Physician: Starleen Arms, MD Primary Care Physician: Elfredia Nevins, MD Admit Date: 11/24/2022  Reason for Consultation/Follow-up: Establishing goals of care  Subjective: Leg pain resolved with repositioning, tells me he ate a good breakfast  Length of Stay: 6  Current Medications: Scheduled Meds:   Chlorhexidine Gluconate Cloth  6 each Topical Q0600   feeding supplement  237 mL Oral BID BM   leptospermum manuka honey  1 Application Topical Daily   multivitamin with minerals  1 tablet Oral Daily   nutrition supplement (JUVEN)  1 packet Oral BID BM   mouth rinse  15 mL Mouth Rinse 4 times per day   pantoprazole (PROTONIX) IV  40 mg Intravenous Q12H    Continuous Infusions:  sodium chloride Stopped (11/26/22 2149)    ceFAZolin (ANCEF) IV Stopped (11/30/22 0900)    PRN Meds: acetaminophen, docusate sodium, ondansetron (ZOFRAN) IV, mouth rinse, mouth rinse, polyethylene glycol  Physical Exam Constitutional:      General: He is not in acute distress.    Appearance: He is ill-appearing.     Comments: Wakes easily to voice  Pulmonary:     Effort: Pulmonary effort is normal.  Skin:    General: Skin is warm and dry.  Neurological:     Comments: Confused at times             Vital Signs: BP 113/67 (BP Location: Left Arm)   Pulse 71   Temp 97.7 F (36.5 C) (Oral)   Resp 20   Ht 5\' 7"  (1.702 m)   Wt 75 kg   SpO2 95%   BMI 25.90 kg/m  SpO2: SpO2: 95 % O2 Device: O2 Device: Room Air O2 Flow Rate:    Intake/output summary:  Intake/Output Summary (Last 24 hours) at 11/30/2022 1049 Last data filed at 11/30/2022 0900 Gross per 24 hour  Intake 420 ml   Output 2100 ml  Net -1680 ml   LBM: Last BM Date : 11/25/22 Baseline Weight: Weight: 85.3 kg Most recent weight: Weight: 75 kg       Palliative Assessment/Data: PPS 30%      Patient Active Problem List   Diagnosis Date Noted   Pressure injury of skin 11/30/2022   Protein-calorie malnutrition, severe 11/25/2022   Sepsis (HCC) 11/24/2022   Intensive care (ICU) myopathy 10/29/2022   Amputation of fifth toe of right foot (HCC) 10/29/2022   Leg edema 09/23/2022   Hyponatremia 09/23/2022   Azotemia 09/22/2022   Prostate cancer (HCC) 09/22/2022   Chronic anticoagulation 09/17/2022   Anemia 09/16/2022   Constipation 09/16/2022   Debility 09/10/2022   PAD (peripheral artery disease) (HCC) 09/01/2022   Osteomyelitis of fifth toe of right foot (HCC) 08/31/2022   Bacteremia 08/31/2022   Acute cystitis with hematuria 08/31/2022   Polymicrobial bacterial infection 08/26/2022   Fever of undetermined origin 08/25/2022   Lactic acidosis 08/25/2022   Generalized weakness 08/25/2022   Bronchitis 08/25/2022   Bilateral pleural effusion 08/25/2022   Right foot ulcer (HCC) 08/25/2022   Essential hypertension 08/25/2022  BPH (benign prostatic hyperplasia) 08/25/2022   Atrial fibrillation (HCC) 08/25/2022    Palliative Care Assessment & Plan   HPI: 89 y.o. male with past medical history of HTN, atrial fibrillation with chronic anticoagulation, PAD, osteomyelitis, amputation of fifth right toe, BPH, and prostate cancer admitted to [facility] on 11/24/2022 from [facility] with AMS, UTI, sepsis, and anemia (Hgb 4.5, Hct 14.5). PMT consulted to discuss GOC.   Assessment: Leg pain resolved with repositioning, tells me he ate a good breakfast.  Call daughter [name] - clinical update provided. Patient continues to recover from acute illness. [name] is aware of plans for discharge today.   [name] confirms goals remain the same: Ultimate goal for patient and daughter is for patient to  return home. Daughter needs time to work this out as she is worried about having enough support to care for him well and also needs to investigate his financial situation. She shares she is hopeful he can return to [facility] following discharge with the ultimate goal of returning home once she is able to arrange care at home. Considering home health vs hospice support at home. Already connected to outpatient palliative ([facility]) per daughter's report who can follow up with him at [facility].   Encouraged [name] to work closely with outpatient palliative once patient is at [facility] for ongoing goals of care - she tells me she had already spoken to them this morning and they are planning for a family meeting once patient is settled at [facility].   All questions and concerns answered.   Recommendations/Plan: [name] plans for patient to dc to [facility] to allow her more time to arrange care at home - option of hospice discussed, she is already connected to [facility] palliative team outpatient - encouraged to work closely with them Discussed concern of ongoing decline, [name] is aware Recommended DNR/DNI - [name] reaching out to other family members and will call back if decision is reached  Code Status: Full code  Discharge Planning: Skilled Nursing Facility for rehab with Palliative care service follow-up  Care plan was discussed with daughter [name]  Thank you for allowing the Palliative Medicine Team to assist in the care of this patient.   *Please note that this is a verbal dictation therefore any spelling or grammatical errors are due to the "Dragon [employer] One" system interpretation.  Gerlean Ren, DNP, [facility] Palliative Medicine Team Team Phone # [phone]  Pager [phone]

## 2022-12-01 DIAGNOSIS — N390 Urinary tract infection, site not specified: Secondary | ICD-10-CM | POA: Diagnosis not present

## 2022-12-01 DIAGNOSIS — I480 Paroxysmal atrial fibrillation: Secondary | ICD-10-CM | POA: Diagnosis not present

## 2022-12-01 DIAGNOSIS — R5381 Other malaise: Secondary | ICD-10-CM | POA: Diagnosis not present

## 2022-12-01 DIAGNOSIS — N179 Acute kidney failure, unspecified: Secondary | ICD-10-CM | POA: Diagnosis not present

## 2022-12-01 LAB — BPAM RBC
Blood Product Expiration Date: 202408202359
Blood Product Expiration Date: 202408232359
Unit Type and Rh: 6200
Unit Type and Rh: 6200

## 2022-12-02 DIAGNOSIS — E46 Unspecified protein-calorie malnutrition: Secondary | ICD-10-CM | POA: Diagnosis not present

## 2022-12-02 DIAGNOSIS — R5381 Other malaise: Secondary | ICD-10-CM | POA: Diagnosis not present

## 2022-12-02 DIAGNOSIS — N390 Urinary tract infection, site not specified: Secondary | ICD-10-CM | POA: Diagnosis not present

## 2022-12-02 DIAGNOSIS — I480 Paroxysmal atrial fibrillation: Secondary | ICD-10-CM | POA: Diagnosis not present

## 2022-12-02 DIAGNOSIS — L8990 Pressure ulcer of unspecified site, unspecified stage: Secondary | ICD-10-CM | POA: Diagnosis not present

## 2022-12-02 DIAGNOSIS — E785 Hyperlipidemia, unspecified: Secondary | ICD-10-CM | POA: Diagnosis not present

## 2022-12-02 DIAGNOSIS — N179 Acute kidney failure, unspecified: Secondary | ICD-10-CM | POA: Diagnosis not present

## 2022-12-07 DIAGNOSIS — Z515 Encounter for palliative care: Secondary | ICD-10-CM | POA: Diagnosis not present

## 2022-12-07 DIAGNOSIS — C61 Malignant neoplasm of prostate: Secondary | ICD-10-CM | POA: Diagnosis not present

## 2022-12-07 DIAGNOSIS — I4891 Unspecified atrial fibrillation: Secondary | ICD-10-CM | POA: Diagnosis not present

## 2022-12-07 DIAGNOSIS — I739 Peripheral vascular disease, unspecified: Secondary | ICD-10-CM | POA: Diagnosis not present

## 2022-12-07 DIAGNOSIS — L89154 Pressure ulcer of sacral region, stage 4: Secondary | ICD-10-CM | POA: Diagnosis not present

## 2022-12-07 DIAGNOSIS — E43 Unspecified severe protein-calorie malnutrition: Secondary | ICD-10-CM | POA: Diagnosis not present

## 2022-12-08 DIAGNOSIS — R5381 Other malaise: Secondary | ICD-10-CM | POA: Diagnosis not present

## 2022-12-08 DIAGNOSIS — R000 Tachycardia, unspecified: Secondary | ICD-10-CM | POA: Diagnosis not present

## 2022-12-08 DIAGNOSIS — N179 Acute kidney failure, unspecified: Secondary | ICD-10-CM | POA: Diagnosis not present

## 2022-12-08 DIAGNOSIS — I480 Paroxysmal atrial fibrillation: Secondary | ICD-10-CM | POA: Diagnosis not present

## 2022-12-08 DIAGNOSIS — N390 Urinary tract infection, site not specified: Secondary | ICD-10-CM | POA: Diagnosis not present

## 2022-12-09 ENCOUNTER — Emergency Department (HOSPITAL_COMMUNITY): Payer: Medicare Other

## 2022-12-09 ENCOUNTER — Other Ambulatory Visit: Payer: Self-pay

## 2022-12-09 ENCOUNTER — Inpatient Hospital Stay (HOSPITAL_COMMUNITY)
Admission: EM | Admit: 2022-12-09 | Discharge: 2022-12-12 | DRG: 698 | Disposition: A | Discharge status: Skilled Nursing Facility | Payer: Medicare Other | Source: Skilled Nursing Facility | Attending: Internal Medicine | Admitting: Internal Medicine

## 2022-12-09 DIAGNOSIS — Y846 Urinary catheterization as the cause of abnormal reaction of the patient, or of later complication, without mention of misadventure at the time of the procedure: Secondary | ICD-10-CM | POA: Diagnosis present

## 2022-12-09 DIAGNOSIS — Z8546 Personal history of malignant neoplasm of prostate: Secondary | ICD-10-CM

## 2022-12-09 DIAGNOSIS — J9811 Atelectasis: Secondary | ICD-10-CM | POA: Diagnosis present

## 2022-12-09 DIAGNOSIS — E46 Unspecified protein-calorie malnutrition: Secondary | ICD-10-CM

## 2022-12-09 DIAGNOSIS — Z89421 Acquired absence of other right toe(s): Secondary | ICD-10-CM

## 2022-12-09 DIAGNOSIS — L89154 Pressure ulcer of sacral region, stage 4: Secondary | ICD-10-CM | POA: Diagnosis present

## 2022-12-09 DIAGNOSIS — R918 Other nonspecific abnormal finding of lung field: Secondary | ICD-10-CM | POA: Diagnosis not present

## 2022-12-09 DIAGNOSIS — N39498 Other specified urinary incontinence: Secondary | ICD-10-CM | POA: Diagnosis not present

## 2022-12-09 DIAGNOSIS — N400 Enlarged prostate without lower urinary tract symptoms: Secondary | ICD-10-CM | POA: Diagnosis present

## 2022-12-09 DIAGNOSIS — E782 Mixed hyperlipidemia: Secondary | ICD-10-CM | POA: Diagnosis not present

## 2022-12-09 DIAGNOSIS — R7989 Other specified abnormal findings of blood chemistry: Secondary | ICD-10-CM | POA: Insufficient documentation

## 2022-12-09 DIAGNOSIS — D649 Anemia, unspecified: Secondary | ICD-10-CM | POA: Diagnosis present

## 2022-12-09 DIAGNOSIS — R0902 Hypoxemia: Secondary | ICD-10-CM | POA: Diagnosis not present

## 2022-12-09 DIAGNOSIS — M5135 Other intervertebral disc degeneration, thoracolumbar region: Secondary | ICD-10-CM | POA: Diagnosis not present

## 2022-12-09 DIAGNOSIS — I700 Atherosclerosis of aorta: Secondary | ICD-10-CM | POA: Diagnosis not present

## 2022-12-09 DIAGNOSIS — A419 Sepsis, unspecified organism: Secondary | ICD-10-CM | POA: Diagnosis present

## 2022-12-09 DIAGNOSIS — R159 Full incontinence of feces: Secondary | ICD-10-CM | POA: Diagnosis not present

## 2022-12-09 DIAGNOSIS — D539 Nutritional anemia, unspecified: Secondary | ICD-10-CM | POA: Diagnosis present

## 2022-12-09 DIAGNOSIS — M6281 Muscle weakness (generalized): Secondary | ICD-10-CM | POA: Diagnosis present

## 2022-12-09 DIAGNOSIS — G9341 Metabolic encephalopathy: Secondary | ICD-10-CM | POA: Diagnosis not present

## 2022-12-09 DIAGNOSIS — N390 Urinary tract infection, site not specified: Secondary | ICD-10-CM | POA: Insufficient documentation

## 2022-12-09 DIAGNOSIS — R410 Disorientation, unspecified: Secondary | ICD-10-CM | POA: Diagnosis not present

## 2022-12-09 DIAGNOSIS — E860 Dehydration: Secondary | ICD-10-CM | POA: Diagnosis not present

## 2022-12-09 DIAGNOSIS — E8809 Other disorders of plasma-protein metabolism, not elsewhere classified: Secondary | ICD-10-CM | POA: Diagnosis not present

## 2022-12-09 DIAGNOSIS — Z87442 Personal history of urinary calculi: Secondary | ICD-10-CM

## 2022-12-09 DIAGNOSIS — R5381 Other malaise: Secondary | ICD-10-CM | POA: Diagnosis not present

## 2022-12-09 DIAGNOSIS — Z7401 Bed confinement status: Secondary | ICD-10-CM | POA: Diagnosis not present

## 2022-12-09 DIAGNOSIS — L89150 Pressure ulcer of sacral region, unstageable: Secondary | ICD-10-CM | POA: Diagnosis not present

## 2022-12-09 DIAGNOSIS — M48061 Spinal stenosis, lumbar region without neurogenic claudication: Secondary | ICD-10-CM | POA: Diagnosis not present

## 2022-12-09 DIAGNOSIS — Z79899 Other long term (current) drug therapy: Secondary | ICD-10-CM

## 2022-12-09 DIAGNOSIS — J9601 Acute respiratory failure with hypoxia: Secondary | ICD-10-CM | POA: Diagnosis present

## 2022-12-09 DIAGNOSIS — T83511A Infection and inflammatory reaction due to indwelling urethral catheter, initial encounter: Principal | ICD-10-CM | POA: Diagnosis present

## 2022-12-09 DIAGNOSIS — Z1152 Encounter for screening for COVID-19: Secondary | ICD-10-CM

## 2022-12-09 DIAGNOSIS — I10 Essential (primary) hypertension: Secondary | ICD-10-CM | POA: Diagnosis present

## 2022-12-09 DIAGNOSIS — L89159 Pressure ulcer of sacral region, unspecified stage: Secondary | ICD-10-CM | POA: Insufficient documentation

## 2022-12-09 DIAGNOSIS — I480 Paroxysmal atrial fibrillation: Secondary | ICD-10-CM | POA: Diagnosis not present

## 2022-12-09 DIAGNOSIS — Z7901 Long term (current) use of anticoagulants: Secondary | ICD-10-CM

## 2022-12-09 DIAGNOSIS — R4182 Altered mental status, unspecified: Secondary | ICD-10-CM | POA: Diagnosis present

## 2022-12-09 DIAGNOSIS — Z4781 Encounter for orthopedic aftercare following surgical amputation: Secondary | ICD-10-CM | POA: Diagnosis not present

## 2022-12-09 DIAGNOSIS — M869 Osteomyelitis, unspecified: Secondary | ICD-10-CM | POA: Diagnosis present

## 2022-12-09 DIAGNOSIS — Z6825 Body mass index (BMI) 25.0-25.9, adult: Secondary | ICD-10-CM

## 2022-12-09 DIAGNOSIS — E43 Unspecified severe protein-calorie malnutrition: Secondary | ICD-10-CM | POA: Diagnosis not present

## 2022-12-09 DIAGNOSIS — E872 Acidosis, unspecified: Secondary | ICD-10-CM | POA: Diagnosis present

## 2022-12-09 DIAGNOSIS — R001 Bradycardia, unspecified: Secondary | ICD-10-CM | POA: Diagnosis not present

## 2022-12-09 DIAGNOSIS — I70221 Atherosclerosis of native arteries of extremities with rest pain, right leg: Secondary | ICD-10-CM | POA: Diagnosis present

## 2022-12-09 DIAGNOSIS — J40 Bronchitis, not specified as acute or chronic: Secondary | ICD-10-CM | POA: Diagnosis present

## 2022-12-09 DIAGNOSIS — I4891 Unspecified atrial fibrillation: Secondary | ICD-10-CM | POA: Diagnosis present

## 2022-12-09 DIAGNOSIS — K922 Gastrointestinal hemorrhage, unspecified: Secondary | ICD-10-CM | POA: Diagnosis present

## 2022-12-09 DIAGNOSIS — L89610 Pressure ulcer of right heel, unstageable: Secondary | ICD-10-CM | POA: Diagnosis present

## 2022-12-09 DIAGNOSIS — J90 Pleural effusion, not elsewhere classified: Secondary | ICD-10-CM | POA: Diagnosis not present

## 2022-12-09 DIAGNOSIS — L89616 Pressure-induced deep tissue damage of right heel: Secondary | ICD-10-CM | POA: Diagnosis not present

## 2022-12-09 DIAGNOSIS — K219 Gastro-esophageal reflux disease without esophagitis: Secondary | ICD-10-CM | POA: Diagnosis not present

## 2022-12-09 DIAGNOSIS — R1311 Dysphagia, oral phase: Secondary | ICD-10-CM | POA: Diagnosis present

## 2022-12-09 DIAGNOSIS — R41841 Cognitive communication deficit: Secondary | ICD-10-CM | POA: Diagnosis present

## 2022-12-09 DIAGNOSIS — L89620 Pressure ulcer of left heel, unstageable: Secondary | ICD-10-CM | POA: Diagnosis not present

## 2022-12-09 DIAGNOSIS — C61 Malignant neoplasm of prostate: Secondary | ICD-10-CM | POA: Diagnosis not present

## 2022-12-09 DIAGNOSIS — K5900 Constipation, unspecified: Secondary | ICD-10-CM | POA: Diagnosis present

## 2022-12-09 DIAGNOSIS — R609 Edema, unspecified: Secondary | ICD-10-CM | POA: Diagnosis present

## 2022-12-09 DIAGNOSIS — L8990 Pressure ulcer of unspecified site, unspecified stage: Secondary | ICD-10-CM | POA: Diagnosis present

## 2022-12-09 DIAGNOSIS — Z885 Allergy status to narcotic agent status: Secondary | ICD-10-CM

## 2022-12-09 DIAGNOSIS — N179 Acute kidney failure, unspecified: Secondary | ICD-10-CM | POA: Diagnosis not present

## 2022-12-09 DIAGNOSIS — G319 Degenerative disease of nervous system, unspecified: Secondary | ICD-10-CM | POA: Diagnosis not present

## 2022-12-09 DIAGNOSIS — R627 Adult failure to thrive: Secondary | ICD-10-CM | POA: Insufficient documentation

## 2022-12-09 DIAGNOSIS — M5137 Other intervertebral disc degeneration, lumbosacral region: Secondary | ICD-10-CM | POA: Diagnosis not present

## 2022-12-09 DIAGNOSIS — I739 Peripheral vascular disease, unspecified: Secondary | ICD-10-CM | POA: Diagnosis present

## 2022-12-09 DIAGNOSIS — Z8619 Personal history of other infectious and parasitic diseases: Secondary | ICD-10-CM

## 2022-12-09 DIAGNOSIS — S31000A Unspecified open wound of lower back and pelvis without penetration into retroperitoneum, initial encounter: Secondary | ICD-10-CM | POA: Diagnosis not present

## 2022-12-09 DIAGNOSIS — N3001 Acute cystitis with hematuria: Secondary | ICD-10-CM | POA: Diagnosis not present

## 2022-12-09 DIAGNOSIS — N3000 Acute cystitis without hematuria: Secondary | ICD-10-CM | POA: Diagnosis not present

## 2022-12-09 DIAGNOSIS — M5136 Other intervertebral disc degeneration, lumbar region: Secondary | ICD-10-CM | POA: Diagnosis not present

## 2022-12-09 DIAGNOSIS — R000 Tachycardia, unspecified: Secondary | ICD-10-CM | POA: Diagnosis not present

## 2022-12-09 DIAGNOSIS — I5031 Acute diastolic (congestive) heart failure: Secondary | ICD-10-CM | POA: Diagnosis not present

## 2022-12-09 DIAGNOSIS — R2681 Unsteadiness on feet: Secondary | ICD-10-CM | POA: Diagnosis present

## 2022-12-09 LAB — CBC WITH DIFFERENTIAL/PLATELET
Abs Immature Granulocytes: 0.03 10*3/uL (ref 0.00–0.07)
Basophils Absolute: 0 10*3/uL (ref 0.0–0.1)
Basophils Relative: 1 %
Eosinophils Absolute: 0.1 10*3/uL (ref 0.0–0.5)
Eosinophils Relative: 1 %
HCT: 26.6 % — ABNORMAL LOW (ref 39.0–52.0)
Hemoglobin: 8.3 g/dL — ABNORMAL LOW (ref 13.0–17.0)
Immature Granulocytes: 0 %
Lymphocytes Relative: 21 %
Lymphs Abs: 1.5 10*3/uL (ref 0.7–4.0)
MCH: 32.2 pg (ref 26.0–34.0)
MCHC: 31.2 g/dL (ref 30.0–36.0)
MCV: 103.1 fL — ABNORMAL HIGH (ref 80.0–100.0)
Monocytes Absolute: 0.5 10*3/uL (ref 0.1–1.0)
Monocytes Relative: 7 %
Neutro Abs: 4.9 10*3/uL (ref 1.7–7.7)
Neutrophils Relative %: 70 %
Platelets: 458 10*3/uL — ABNORMAL HIGH (ref 150–400)
RBC: 2.58 MIL/uL — ABNORMAL LOW (ref 4.22–5.81)
RDW: 20 % — ABNORMAL HIGH (ref 11.5–15.5)
WBC: 7.1 10*3/uL (ref 4.0–10.5)
nRBC: 0 % (ref 0.0–0.2)

## 2022-12-09 LAB — URINALYSIS, ROUTINE W REFLEX MICROSCOPIC
Bilirubin Urine: NEGATIVE
Glucose, UA: NEGATIVE mg/dL
Ketones, ur: NEGATIVE mg/dL
Nitrite: NEGATIVE
Protein, ur: 100 mg/dL — AB
Specific Gravity, Urine: 1.015 (ref 1.005–1.030)
WBC, UA: >50 WBC/hpf (ref 0–5)
pH: 5 (ref 5.0–8.0)

## 2022-12-09 LAB — LACTIC ACID, PLASMA
Lactic Acid, Venous: 2.6 mmol/L (ref 0.5–1.9)
Lactic Acid, Venous: 2.2 mmol/L (ref 0.5–1.9)

## 2022-12-09 LAB — COMPREHENSIVE METABOLIC PANEL
ALT: 30 U/L (ref 0–44)
AST: 35 U/L (ref 15–41)
Albumin: 2.4 g/dL — ABNORMAL LOW (ref 3.5–5.0)
Alkaline Phosphatase: 104 U/L (ref 38–126)
Anion gap: 13 (ref 5–15)
BUN: 33 mg/dL — ABNORMAL HIGH (ref 8–23)
CO2: 25 mmol/L (ref 22–32)
Calcium: 8.2 mg/dL — ABNORMAL LOW (ref 8.9–10.3)
Chloride: 98 mmol/L (ref 98–111)
Creatinine, Ser: 0.86 mg/dL (ref 0.61–1.24)
GFR, Estimated: >60 mL/min (ref >60)
Glucose, Bld: 108 mg/dL — ABNORMAL HIGH (ref 70–99)
Potassium: 3.9 mmol/L (ref 3.5–5.1)
Sodium: 136 mmol/L (ref 135–145)
Total Bilirubin: 0.9 mg/dL (ref 0.3–1.2)
Total Protein: 6.3 g/dL — ABNORMAL LOW (ref 6.5–8.1)

## 2022-12-09 LAB — RESP PANEL BY RT-PCR (RSV, FLU A&B, COVID)  RVPGX2
Influenza A by PCR: NEGATIVE
Influenza B by PCR: NEGATIVE
Resp Syncytial Virus by PCR: NEGATIVE
SARS Coronavirus 2 by RT PCR: NEGATIVE

## 2022-12-09 LAB — BRAIN NATRIURETIC PEPTIDE: B Natriuretic Peptide: 223 pg/mL — ABNORMAL HIGH (ref 0.0–100.0)

## 2022-12-09 LAB — CBG MONITORING, ED: Glucose-Capillary: 105 mg/dL — ABNORMAL HIGH (ref 70–99)

## 2022-12-09 MED ORDER — SODIUM CHLORIDE 0.9 % IV SOLN
1.0000 g | Freq: Once | INTRAVENOUS | Status: AC
  Administered 2022-12-09: 1 g via INTRAVENOUS
  Filled 2022-12-09: qty 10

## 2022-12-09 MED ORDER — FUROSEMIDE 10 MG/ML IJ SOLN: 40.0000 mg | Freq: Once | INTRAMUSCULAR | Status: DC

## 2022-12-09 MED ORDER — LACTATED RINGERS IV BOLUS
1000.0000 mL | Freq: Once | INTRAVENOUS | Status: AC
  Administered 2022-12-09: 1000 mL via INTRAVENOUS

## 2022-12-09 MED ORDER — VANCOMYCIN HCL 1500 MG/300ML IV SOLN
1500.0000 mg | Freq: Once | INTRAVENOUS | Status: AC
  Administered 2022-12-09: 1500 mg via INTRAVENOUS
  Filled 2022-12-09: qty 300

## 2022-12-09 NOTE — ED Notes (Signed)
Attempted to give pt ensure. Pt held straw in mouth and unable to swallow. Daughter states pt is usually able to drink from a straw. EDP made aware.

## 2022-12-09 NOTE — ED Provider Notes (Signed)
[place] EMERGENCY DEPARTMENT AT [facility] Provider Note   CSN: [id number] Arrival date & time: 12/09/22  1428     History  Chief Complaint  Patient presents with   Failure To Thrive    Jeffie Pollock Sr. is a 89 y.o. male with history including severe PAD requiring recent right fifth toe amputation, hypertension, paroxysmal A-fib on Eliquis, chronic indwelling Foley catheter, recent admission here for UTI sepsis presenting today for evaluation of general decline, reduced p.o. intake although he does endorse having a small lunch prior to arrival.  Daughter at the bedside reports he has been coughing over the past several days and today she has noticed confusion which is not his baseline and has had increased agitation since yesterday.  She also notes increased tremor and voice making it difficult to understand.  He has had reduced p.o. intake, he denies any specific complaints of pain, denies chest pain, shortness of breath although he arrives here with initial pulse ox of 89%, quickly improved to 100% on 2L.  Daughter states he has significant decubitus ulcer, also breakdown of skin at his right heel, he is currently undergoing wound care at the nursing home.  No documented fevers.  Daughter states he was confused and agitated like this during his last admission too.   The history is provided by the patient and a relative (daughter at bedside).       Home Medications Prior to Admission medications   Medication Sig Start Date End Date Taking? Authorizing Provider  acetaminophen (TYLENOL) 325 MG tablet Take 2 tablets (650 mg total) by mouth every 6 (six) hours as needed for mild pain, moderate pain, fever or headache. 11/30/22   Elgergawy, Leana Roe, MD  alum & mag hydroxide-simeth (MAALOX/MYLANTA) 200-200-20 MG/5ML suspension Take 15-30 mLs by mouth every 2 (two) hours as needed for indigestion. Patient taking differently: Take 30 mLs by mouth every 2 (two) hours as needed for  indigestion. 09/10/22   Pennie Banter, DO  Amino Acids-Protein Hydrolys (PRO-STAT PROFILE PO) Take 30 mLs by mouth 3 (three) times daily.    [provider]  apixaban (ELIQUIS) 5 MG TABS tablet Take 1 tablet (5 mg total) by mouth 2 (two) times daily. 09/10/22   Pennie Banter, DO  ascorbic acid (VITAMIN C) 1000 MG tablet Take 1 tablet (1,000 mg total) by mouth daily. 09/11/22   Pennie Banter, DO  atorvastatin (LIPITOR) 40 MG tablet Take 1 tablet (40 mg total) by mouth daily. 09/11/22   Pennie Banter, DO  furosemide (LASIX) 20 MG tablet Take 1 tablet (20 mg total) by mouth daily. 09/23/22   Love, Evlyn Kanner, PA-C  guaiFENesin-dextromethorphan (ROBITUSSIN DM) 100-10 MG/5ML syrup Take 15 mLs by mouth every 4 (four) hours as needed for cough.    [provider]  lactose free nutrition (BOOST) LIQD Take 237 mLs by mouth 2 (two) times daily.    [provider]  Multiple Vitamin (MULTIVITAMIN WITH MINERALS) TABS tablet Take 1 tablet by mouth daily. 09/11/22   Pennie Banter, DO  omega-3 acid ethyl esters (LOVAZA) 1 g capsule Take 1 capsule (1 g total) by mouth daily. 09/11/22   Esaw Grandchild A, DO  ondansetron (ZOFRAN) 4 MG tablet Take 4 mg by mouth every 6 (six) hours as needed for nausea or vomiting.    [provider]  pantoprazole (PROTONIX) 40 MG tablet Take 1 tablet (40 mg total) by mouth 2 (two) times daily. 09/23/22  Love, Pamela S, PA-C  polyethylene glycol (MIRALAX / GLYCOLAX) 17 g packet Take 17 g by mouth daily. Hold for loose BM.    [provider]  senna-docusate (SENOKOT-S) 8.6-50 MG tablet Take 1 tablet by mouth 2 (two) times daily. 09/10/22   Pennie Banter, DO  tamsulosin (FLOMAX) 0.4 MG CAPS capsule Take 0.4 mg by mouth daily after supper.    [provider]      Allergies    Codeine    Review of Systems   Review of Systems  Constitutional:  Positive for appetite change. Negative for fever.  HENT:  Negative for  congestion and sore throat.   Eyes: Negative.   Respiratory:  Negative for chest tightness and shortness of breath.   Cardiovascular:  Negative for chest pain.  Gastrointestinal:  Negative for abdominal pain, nausea and vomiting.  Genitourinary: Negative.   Musculoskeletal:  Negative for arthralgias, joint swelling and neck pain.  Skin:  Positive for wound. Negative for rash.  Neurological:  Negative for dizziness, weakness, light-headedness, numbness and headaches.  Psychiatric/Behavioral:  Positive for confusion.   All other systems reviewed and are negative.   Physical Exam Updated Vital Signs BP (!) 131/104   Pulse 61   Temp 98.8 F (37.1 C) (Axillary)   Resp 13   Ht 5\' 7"  (1.702 m)   Wt 75 kg   SpO2 100%   BMI 25.90 kg/m  Physical Exam Vitals and nursing note reviewed.  Constitutional:      Appearance: He is well-developed.  HENT:     Head: Normocephalic and atraumatic.  Eyes:     Extraocular Movements: Extraocular movements intact.     Conjunctiva/sclera: Conjunctivae normal.  Cardiovascular:     Rate and Rhythm: Normal rate and regular rhythm.     Heart sounds: Normal heart sounds.  Pulmonary:     Effort: Pulmonary effort is normal.     Breath sounds: Normal breath sounds. No wheezing.  Abdominal:     General: Bowel sounds are normal.     Palpations: Abdomen is soft.     Tenderness: There is no abdominal tenderness. There is no guarding.  Musculoskeletal:        General: Normal range of motion.     Cervical back: Normal range of motion.  Skin:    General: Skin is warm and dry.     Comments: betadine distal left great toe. Skin breakdown left heel, left great toe, soft boots, dressing on heel.  Stage 4 sacral decubitis per dg - wound edges appear clean,  new dressing today - packing not removed.  Neurological:     Mental Status: He is alert. He is confused.     Motor: Tremor present.     Comments: Significant tremor all 4 extremities.  Awake and alert,  anxious appearing. Equal but weak grip strength. Closes eyes on command, does not smile when asked.   Psychiatric:     Comments: Anxious appearing,  holding bed railings,  expressing concern about not falling out of bed.     ED Results / Procedures / Treatments   Labs (all labs ordered are listed, but only abnormal results are displayed) Labs Reviewed  URINALYSIS, ROUTINE W REFLEX MICROSCOPIC - Abnormal; Notable for the following components:      Result Value   APPearance CLOUDY (*)    Hgb urine dipstick SMALL (*)    Protein, ur 100 (*)    Leukocytes,Ua LARGE (*)    Bacteria, UA RARE (*)  All other components within normal limits  LACTIC ACID, PLASMA - Abnormal; Notable for the following components:   Lactic Acid, Venous 2.6 (*)    All other components within normal limits  LACTIC ACID, PLASMA - Abnormal; Notable for the following components:   Lactic Acid, Venous 2.2 (*)    All other components within normal limits  CBC WITH DIFFERENTIAL/PLATELET - Abnormal; Notable for the following components:   RBC 2.58 (*)    Hemoglobin 8.3 (*)    HCT 26.6 (*)    MCV 103.1 (*)    RDW 20.0 (*)    Platelets 458 (*)    All other components within normal limits  COMPREHENSIVE METABOLIC PANEL - Abnormal; Notable for the following components:   Glucose, Bld 108 (*)    BUN 33 (*)    Calcium 8.2 (*)    Total Protein 6.3 (*)    Albumin 2.4 (*)    All other components within normal limits  BRAIN NATRIURETIC PEPTIDE - Abnormal; Notable for the following components:   B Natriuretic Peptide 223.0 (*)    All other components within normal limits  CBG MONITORING, ED - Abnormal; Notable for the following components:   Glucose-Capillary 105 (*)    All other components within normal limits  RESP PANEL BY RT-PCR (RSV, FLU A&B, COVID)  RVPGX2  CULTURE, BLOOD (ROUTINE X 2)  CULTURE, BLOOD (ROUTINE X 2)    EKG None  Radiology CT Head Wo Contrast  Result Date: 12/09/2022 CLINICAL DATA:  Mental  status change, persistent or worsening EXAM: CT HEAD WITHOUT CONTRAST TECHNIQUE: Contiguous axial images were obtained from the base of the skull through the vertex without intravenous contrast. RADIATION DOSE REDUCTION: This exam was performed according to the departmental dose-optimization program which includes automated exposure control, adjustment of the mA and/or kV according to patient size and/or use of iterative reconstruction technique. COMPARISON:  12/09/2010 FINDINGS: Brain: There is atrophy and chronic small vessel disease changes. No acute intracranial abnormality. Specifically, no hemorrhage, hydrocephalus, mass lesion, acute infarction, or significant intracranial injury. Vascular: No hyperdense vessel or unexpected calcification. Skull: No acute calvarial abnormality. Sinuses/Orbits: No acute findings Other: None IMPRESSION: Atrophy, chronic microvascular disease. No acute intracranial abnormality. Electronically Signed   By: Charlett Nose M.D.   On: 12/09/2022 20:20   DG Chest Port 1 View  Result Date: 12/09/2022 CLINICAL DATA:  hypoxia EXAM: PORTABLE CHEST 1 VIEW COMPARISON:  11/24/2022. FINDINGS: Probable atelectatic changes at the left lung base with associated small left pleural effusion. Bilateral lungs are otherwise clear. Right lateral costophrenic angle is clear. Stable cardio-mediastinal silhouette. Aortic arch calcifications noted. No acute osseous abnormalities. The soft tissues are within normal limits. IMPRESSION: Probable atelectatic changes at the left lung base with associated small left pleural effusion. Electronically Signed   By: Jules Schick M.D.   On: 12/09/2022 16:40    Procedures Procedures    Medications Ordered in ED Medications  furosemide (LASIX) injection 40 mg (has no administration in time range)  lactated ringers bolus 1,000 mL (0 mLs Intravenous Stopped 12/09/22 1849)  cefTRIAXone (ROCEPHIN) 1 g in sodium chloride 0.9 % 100 mL IVPB (0 g Intravenous  Stopped 12/09/22 1922)    ED Course/ Medical Decision Making/ A&P                                 Medical Decision Making Patient presenting with failure to thrive, reduced appetite and  increased confusion per daughter at bedside.  He presents from a local nursing home where he is receiving ongoing wound care for decubitus ulcer and ankle ulcers secondary to PAD.  He is afebrile with normal vital signs except that he does have a pulse ox on room air of 89%.  Daughter states that she has noticed a cough in recent days.  He denies shortness of breath or chest pain.  He does not have an elevated lactic acid at 2.6 but does not meet SIRS criteria, vital signs are otherwise stable, normal WBC count, normal MAP.  Suspect elevated lactate is secondary to dehydration, with his clinical history of poor p.o. intake and a BUN elevated at 33, he is receiving IV fluids while awaiting other lab results.  Labs and imaging obtained with no clear diagnosis for patient's symptoms today.  We attempted to give him p.o. fluids, he was too tremulous to try drinking for a cup and was unable to use a straw which daughter states is also new.  CT imaging is ordered to rule out cva.  Amount and/or Complexity of Data Reviewed External Data Reviewed: labs.    Details:  Urine cx from last admission - Proteus, treated with IV Rocephin during hospital stay transitioned to PO cefadroxil x 5 days.  Echo 5/24 normal EF. Labs: ordered.    Details: Elevated lactate at 2.6, repeat better at 2.2, respiratory panel negative, his urinalysis is cloudy with small amount hemoglobin large leukocytes 6-10 RBCs and rare bacteria.  This is a specimen from an indwelling Foley catheter.  Given his recent hospitalization for urosepsis, his prior urine culture was reviewed and Rocephin was ordered to cover him for possible recurrent UTI.  Considered foley catheter change but edematous foreskin with skin breakdown,  concern about being able to place  new foley so not attempted.    BNP slightly elevated from baseline - 223, was 196 2 months ago. Radiology: ordered.    Details: He was hypoxic upon initial presentation at 89%, he was given 2 L nasal cannula and oxygen oxygen remained at 100%.  Chest x-ray showing a small left pleural effusion.    CT head is negative for acute CVA Discussion of management or test interpretation with external provider(s): Pt discussed with Dr. Thomes Dinning who accepts pt for admission.  Discussed pt's presentation, labs and cxr,  he is more concerned about possible new onset chf given new effusion, has asked for lasix 40 mg which has been ordered.               Final Clinical Impression(s) / ED Diagnoses Final diagnoses:  Disorientation  Urinary tract infection associated with indwelling urethral catheter, initial encounter [facility])  Dehydration    Rx / DC Orders ED Discharge Orders     None         Victoriano Lain 12/09/22 2102    Benjiman Core, MD 12/10/22 [id number]

## 2022-12-09 NOTE — ED Triage Notes (Signed)
Pt bib EMS for from [facility] for failure to thrive. PT was recently discharge from [facility] after an admission for sepsis. Per EMS nursing facility states pt is not eating much. Pt denies pain. Pt has chronic foley catheter.

## 2022-12-09 NOTE — H&P (Signed)
History and Physical    Patient: [name] VZD:[id number] DOB: 1929-03-02 DOA: 12/09/2022 DOS: the patient was seen and examined on 12/10/2022 PCP: Elfredia Nevins, MD  Patient coming from: SNF  Chief Complaint:  Chief Complaint  Patient presents with   Failure To Thrive   HPI: Jesse Pollock Sr. is a 89 y.o. male with medical history significant of  paroxysmal atrial fibrillation on Eliquis, hypertension, peripheral arterial disease status post right fifth toe amputation, chronic indwelling Foley catheter, BPH, prostate cancer, sacral decubitus ulcer as well as right heel ulcer who presents to the emergency department from [facility] via EMS due to failure to thrive.  Patient was unable to provide history, history was obtained from daughter at bedside and from ED PA.  Per report, patient was reported to have had gradual decline from baseline functioning due to poor oral intake in the last few days and increased confusion and agitation since yesterday.  He was noted with increased hand tremors.  ED Course:  In the emergency department, patient was hemodynamically stable except for O2 sat at 86%-89% which improved to 95% to 100% with supplemental oxygen at 2 LPM.  Workup in the ED showed WBC 7.1, hemoglobin 8.3, hematocrit 26.6, MCV 103.1, platelets 458.  BMP was normal except for blood glucose of 108 and BUN of 33.  Albumin 2.4.  Urinalysis was positive for large leukocytes, WBC greater than 50 WBC/hpf, but rare bacteria.  Lactic acid 2.6 > 2.3 and BNP of 223. Influenza A, B, SARS coronavirus 2, RSV was negative. CT head without contrast showed no acute intracranial abnormality Chest x-ray showed probable telectatic changes at the left lung base with associated small left pleural effusion. Patient was treated with IV ceftriaxone and vancomycin, IV LR 1 L was given Hospitalist was asked admit patient for further evaluation and management.   Review of Systems: Review of systems as  noted in the HPI. All other systems reviewed and are negative.   Past Medical History:  Diagnosis Date   Chronic indwelling Foley catheter    Followed by Urology   Hypertension    Kidney calculus 2014;2013   PAD (peripheral artery disease) (HCC)    S/p R foot fifth ray amputation 08/2022 [facility])   PAF (paroxysmal atrial fibrillation) (HCC)    On Eliquis   Pneumonia    Prostate cancer (HCC)    Shortness of breath    Past Surgical History:  Procedure Laterality Date   ABDOMINAL AORTOGRAM W/LOWER EXTREMITY N/A 09/02/2022   Procedure: ABDOMINAL AORTOGRAM W/LOWER EXTREMITY;  Surgeon: Victorino Sparrow, MD;  Location: [facility] INVASIVE CV LAB;  Service: Vascular;  Laterality: N/A;   AMPUTATION Right 09/08/2022   Procedure: RIGHT FOOT FIFTH RAY AMPUTATION;  Surgeon: [name], MD;  Location: [facility] OR;  Service: Orthopedics;  Laterality: Right;   CATARACT EXTRACTION W/PHACO Right 02/13/2013   Procedure: CATARACT EXTRACTION RIGHT EYE (WITH PHACO) AND INTRAOCULAR LENS PLACEMENT  CDE=14.53;  Surgeon: Loraine Leriche T. Nile Riggs, MD;  Location: [facility] ORS;  Service: Ophthalmology;  Laterality: Right;   CATARACT EXTRACTION W/PHACO Left 02/27/2013   Procedure: CATARACT EXTRACTION PHACO AND INTRAOCULAR LENS PLACEMENT (IOC);  Surgeon: Loraine Leriche T. Nile Riggs, MD;  Location: [facility] ORS;  Service: Ophthalmology;  Laterality: Left;  CDE:12.54   COLONOSCOPY     JOINT REPLACEMENT Right 1993;1980   Right x2    Social History:  reports that he has never smoked. He has never used smokeless tobacco. He reports that he does not drink alcohol  and does not use drugs.   Allergies  Allergen Reactions   Codeine Swelling    No family history on file.   Prior to Admission medications   Medication Sig Start Date End Date Taking? Authorizing Provider  acetaminophen (TYLENOL) 325 MG tablet Take 2 tablets (650 mg total) by mouth every 6 (six) hours as needed for mild pain, moderate pain, fever or headache. 11/30/22   Elgergawy, Leana Roe, MD   alum & mag hydroxide-simeth (MAALOX/MYLANTA) 200-200-20 MG/5ML suspension Take 15-30 mLs by mouth every 2 (two) hours as needed for indigestion. Patient taking differently: Take 30 mLs by mouth every 2 (two) hours as needed for indigestion. 09/10/22   Pennie Banter, DO  Amino Acids-Protein Hydrolys (PRO-STAT PROFILE PO) Take 30 mLs by mouth 3 (three) times daily.    [provider]  apixaban (ELIQUIS) 5 MG TABS tablet Take 1 tablet (5 mg total) by mouth 2 (two) times daily. 09/10/22   Pennie Banter, DO  ascorbic acid (VITAMIN C) 1000 MG tablet Take 1 tablet (1,000 mg total) by mouth daily. 09/11/22   Pennie Banter, DO  atorvastatin (LIPITOR) 40 MG tablet Take 1 tablet (40 mg total) by mouth daily. 09/11/22   Pennie Banter, DO  furosemide (LASIX) 20 MG tablet Take 1 tablet (20 mg total) by mouth daily. 09/23/22   Love, Evlyn Kanner, PA-C  guaiFENesin-dextromethorphan (ROBITUSSIN DM) 100-10 MG/5ML syrup Take 15 mLs by mouth every 4 (four) hours as needed for cough.    [provider]  lactose free nutrition (BOOST) LIQD Take 237 mLs by mouth 2 (two) times daily.    [provider]  Multiple Vitamin (MULTIVITAMIN WITH MINERALS) TABS tablet Take 1 tablet by mouth daily. 09/11/22   Pennie Banter, DO  omega-3 acid ethyl esters (LOVAZA) 1 g capsule Take 1 capsule (1 g total) by mouth daily. 09/11/22   Esaw Grandchild A, DO  ondansetron (ZOFRAN) 4 MG tablet Take 4 mg by mouth every 6 (six) hours as needed for nausea or vomiting.    [provider]  pantoprazole (PROTONIX) 40 MG tablet Take 1 tablet (40 mg total) by mouth 2 (two) times daily. 09/23/22   Love, Evlyn Kanner, PA-C  polyethylene glycol (MIRALAX / GLYCOLAX) 17 g packet Take 17 g by mouth daily. Hold for loose BM.    [provider]  senna-docusate (SENOKOT-S) 8.6-50 MG tablet Take 1 tablet by mouth 2 (two) times daily. 09/10/22   Pennie Banter, DO  tamsulosin (FLOMAX) 0.4 MG CAPS capsule Take 0.4  mg by mouth daily after supper.    [provider]    Physical Exam: BP 123/80 (BP Location: Right Arm)   Pulse 63   Temp 97.8 F (36.6 C) (Axillary)   Resp 20   Ht 5\' 7"  (1.702 m)   Wt 88 kg   SpO2 91%   BMI 30.39 kg/m   General: 89 y.o. year-old male ill appearing, but in no acute distress.  Alert and oriented x2 (person and place). HEENT: NCAT, EOMI Neck: Supple, trachea medial Cardiovascular: Irregular rate and rhythm with no rubs or gallops.  No thyromegaly or JVD noted.  No lower extremity edema. 2/4 pulses in all 4 extremities. Respiratory: Clear to auscultation with no wheezes or rales. Abdomen: Soft, nontender nondistended with normal bowel sounds x4 quadrants. Muskuloskeletal: No cyanosis, clubbing or edema noted bilaterally Neuro: Tremors of all extremities, no focal neurologic deficit.  Sensation, reflexes intact Skin: Stage IV sacral decubitus  ulcer, left heel ulcer, left great toe wound. Psychiatry: Mood is appropriate for condition and setting                  Labs on Admission:  Basic Metabolic Panel: Recent Labs  Lab 12/09/22 1536  NA 136  K 3.9  CL 98  CO2 25  GLUCOSE 108*  BUN 33*  CREATININE 0.86  CALCIUM 8.2*   Liver Function Tests: Recent Labs  Lab 12/09/22 1536  AST 35  ALT 30  ALKPHOS 104  BILITOT 0.9  PROT 6.3*  ALBUMIN 2.4*   No results for input(s): "LIPASE", "AMYLASE" in the last 168 hours. No results for input(s): "AMMONIA" in the last 168 hours. CBC: Recent Labs  Lab 12/09/22 1536  WBC 7.1  NEUTROABS 4.9  HGB 8.3*  HCT 26.6*  MCV 103.1*  PLT 458*   Cardiac Enzymes: No results for input(s): "CKTOTAL", "CKMB", "CKMBINDEX", "TROPONINI" in the last 168 hours.  BNP (last 3 results) Recent Labs    08/24/22 1542 09/22/22 1237 12/09/22 1536  BNP 160.0* 196.2* 223.0*    ProBNP (last 3 results) No results for input(s): "PROBNP" in the last 8760 hours.  CBG: Recent Labs  Lab 12/09/22 1721  12/10/22 0038  GLUCAP 105* 92    Radiological Exams on Admission: CT Head Wo Contrast  Result Date: 12/09/2022 CLINICAL DATA:  Mental status change, persistent or worsening EXAM: CT HEAD WITHOUT CONTRAST TECHNIQUE: Contiguous axial images were obtained from the base of the skull through the vertex without intravenous contrast. RADIATION DOSE REDUCTION: This exam was performed according to the departmental dose-optimization program which includes automated exposure control, adjustment of the mA and/or kV according to patient size and/or use of iterative reconstruction technique. COMPARISON:  12/09/2010 FINDINGS: Brain: There is atrophy and chronic small vessel disease changes. No acute intracranial abnormality. Specifically, no hemorrhage, hydrocephalus, mass lesion, acute infarction, or significant intracranial injury. Vascular: No hyperdense vessel or unexpected calcification. Skull: No acute calvarial abnormality. Sinuses/Orbits: No acute findings Other: None IMPRESSION: Atrophy, chronic microvascular disease. No acute intracranial abnormality. Electronically Signed   By: Charlett Nose M.D.   On: 12/09/2022 20:20   DG Chest Port 1 View  Result Date: 12/09/2022 CLINICAL DATA:  hypoxia EXAM: PORTABLE CHEST 1 VIEW COMPARISON:  11/24/2022. FINDINGS: Probable atelectatic changes at the left lung base with associated small left pleural effusion. Bilateral lungs are otherwise clear. Right lateral costophrenic angle is clear. Stable cardio-mediastinal silhouette. Aortic arch calcifications noted. No acute osseous abnormalities. The soft tissues are within normal limits. IMPRESSION: Probable atelectatic changes at the left lung base with associated small left pleural effusion. Electronically Signed   By: Jules Schick M.D.   On: 12/09/2022 16:40    EKG: I independently viewed the EKG done and my findings are as followed: Atrial fibrillation with rate control  Assessment/Plan Present on Admission:  Acute  respiratory failure with hypoxia (HCC)  Pleural effusion  Atrial fibrillation (HCC)  Principal Problem:   Acute respiratory failure with hypoxia (HCC) Active Problems:   Pleural effusion   Atrial fibrillation (HCC)   Elevated brain natriuretic peptide (BNP) level   UTI (urinary tract infection)   Hypoalbuminemia due to protein-calorie malnutrition (HCC)   Failure to thrive in adult   Sacral decubitus ulcer   Mixed hyperlipidemia   GERD (gastroesophageal reflux disease)  Acute respiratory failure with hypoxia Pleural effusion Chest x-ray showed atelectasis and small pleural effusion which may have contributed to patient's hypoxia Continue supplemental oxygen to maintain  O2 sat > 92% with plan to wean patient off days as tolerated (patient does not use oxygen at baseline) Incentive spirometry as tolerated IV Lasix 20 mg x 1 will be given  Elevated BNP BNP 223 (was 196 about 2 months ago) Continue total input/output, daily weights and fluid restriction Continue telemetry Echocardiogram done on 08/26/2022 showed LVEF of 65 to 70%.  No RWMA, mild LVH.    Echocardiogram will be done in the morning  Possible CAUTI POA Patient was started on IV ceftriaxone  Lactic acidosis Lactic acid 2.6 > 2.2 Continue to trend lactic acid  Hypoalbuminemia Albumin 2.4, consider protein supplement status post swallow eval  Failure to thrive in adult Patient has been having poor oral intake in the last few days He will be temporarily placed on n.p.o. pending swallow eval SLP in the morning  Macrocytic Anemia MCV 103.1, vitamin B12 and folate levels will be checked  Sacral decubitus ulcer, left heel ulcer Continue IV vancomycin Wound nurse will be consulted and we will await further recommendation  Paroxysmal Afib Continue Eliquis  PAD/Critical limb ischemia of RLE Patient had right fifth partial ray amputation 5/22 with Ortho [facility]) per medical record  Mixed hyperlipidemia Continue  Lipitor, Lovaza  GERD Continue Protonix    DVT prophylaxis: Eliquis  Advance Care Planning: Full code  Consults: None  Family Communication: Daughter at bedside (all questions answered to satisfaction)  Severity of Illness: The appropriate patient status for this patient is INPATIENT. Inpatient status is judged to be reasonable and necessary in order to provide the required intensity of service to ensure the patient's safety. The patient's presenting symptoms, physical exam findings, and initial radiographic and laboratory data in the context of their chronic comorbidities is felt to place them at high risk for further clinical deterioration. Furthermore, it is not anticipated that the patient will be medically stable for discharge from the hospital within 2 midnights of admission.   * I certify that at the point of admission it is my clinical judgment that the patient will require inpatient hospital care spanning beyond 2 midnights from the point of admission due to high intensity of service, high risk for further deterioration and high frequency of surveillance required.*  Author: Frankey Shown, DO 12/10/2022 12:49 AM  For on call review [email or web address].

## 2022-12-09 NOTE — ED Notes (Signed)
ED TO INPATIENT HANDOFF REPORT  ED Nurse Name and Phone #: Jaquelyn Bitter, [phone]  S Name/Age/Gender Jesse Pollock Sr. 89 y.o. male Room/Bed: APA10/APA10  Code Status   Code Status: Prior  Home/SNF/Other Nursing Home Pt disoriented x4 Is this baseline? No   Triage Complete: Triage complete  Chief Complaint Acute respiratory failure with hypoxia (HCC) [J96.01]  Triage Note Pt bib EMS for from [facility] for failure to thrive. PT was recently discharge from [facility] after an admission for sepsis. Per EMS nursing facility states pt is not eating much. Pt denies pain. Pt has chronic foley catheter.    Allergies Allergies  Allergen Reactions   Codeine Swelling    Level of Care/Admitting Diagnosis ED Disposition     ED Disposition  Admit   Condition  --   Comment  Hospital Area: [facility] [[id number]]  Level of Care: Telemetry [5]  Covid Evaluation: Asymptomatic - no recent exposure (last 10 days) testing not required  Diagnosis: Acute respiratory failure with hypoxia [facility]) [[id number]]  Admitting Physician: Frankey Shown [[id number]]  Attending Physician: Frankey Shown [[id number]]  Certification:: I certify this patient will need inpatient services for at least 2 midnights  Expected Medical Readiness: 12/12/2022          B Medical/Surgery History Past Medical History:  Diagnosis Date   Chronic indwelling Foley catheter    Followed by Urology   Hypertension    Kidney calculus 2014;2013   PAD (peripheral artery disease) (HCC)    S/p R foot fifth ray amputation 08/2022 [facility])   PAF (paroxysmal atrial fibrillation) (HCC)    On Eliquis   Pneumonia    Prostate cancer (HCC)    Shortness of breath    Past Surgical History:  Procedure Laterality Date   ABDOMINAL AORTOGRAM W/LOWER EXTREMITY N/A 09/02/2022   Procedure: ABDOMINAL AORTOGRAM W/LOWER EXTREMITY;  Surgeon: Victorino Sparrow, MD;  Location: [facility] INVASIVE CV LAB;  Service: Vascular;  Laterality: N/A;    AMPUTATION Right 09/08/2022   Procedure: RIGHT FOOT FIFTH RAY AMPUTATION;  Surgeon: [name], MD;  Location: [facility] OR;  Service: Orthopedics;  Laterality: Right;   CATARACT EXTRACTION W/PHACO Right 02/13/2013   Procedure: CATARACT EXTRACTION RIGHT EYE (WITH PHACO) AND INTRAOCULAR LENS PLACEMENT  CDE=14.53;  Surgeon: Loraine Leriche T. Nile Riggs, MD;  Location: [facility];  Service: Ophthalmology;  Laterality: Right;   CATARACT EXTRACTION W/PHACO Left 02/27/2013   Procedure: CATARACT EXTRACTION PHACO AND INTRAOCULAR LENS PLACEMENT (IOC);  Surgeon: Loraine Leriche T. Nile Riggs, MD;  Location: [facility] ORS;  Service: Ophthalmology;  Laterality: Left;  CDE:12.54   COLONOSCOPY     JOINT REPLACEMENT Right 1993;1980   Right x2     A IV Location/Drains/Wounds Patient Lines/Drains/Airways Status     Active Line/Drains/Airways     Name Placement date Placement time Site Days   Peripheral IV 12/09/22 Anterior;Proximal;Right Forearm 12/09/22  1640  Forearm  less than 1   Urethral Catheter Deanna, RN Coude 16 Fr. 11/24/22  1707  Coude  15   Pressure Injury 08/25/22 Heel Left Deep Tissue Pressure Injury - Purple or maroon localized area of discolored intact skin or blood-filled blister due to damage of underlying soft tissue from pressure and/or shear. 6.5cm x 5.5cm 08/25/22  0730  -- 106   Pressure Injury 08/25/22 Buttocks Right Stage 2 -  Partial thickness loss of dermis presenting as a shallow open injury with a red, pink wound bed without slough. 08/25/22  --  -- 106   Pressure Injury 09/10/22  Sacrum Medial Stage 2 -  Partial thickness loss of dermis presenting as a shallow open injury with a red, pink wound bed without slough. Elongated area over sacrum 09/10/22  1912  -- 90   Pressure Injury 11/24/22 Coccyx Medial Stage 4 - Full thickness tissue loss with exposed bone, tendon or muscle. 11/24/22  2130  -- 15   Pressure Injury 11/25/22 Heel Right Unstageable - Full thickness tissue loss in which the base of the injury is covered by  slough (yellow, tan, gray, green or brown) and/or eschar (tan, brown or black) in the wound bed. Eschar 11/25/22  0800  -- 14   Wound / Incision (Open or Dehisced) 09/10/22 Non-pressure wound Toe (Comment  which one) Left Great Toe Blood blister 09/10/22  1753  Toe (Comment  which one)  90            Intake/Output Last 24 hours  Intake/Output Summary (Last 24 hours) at 12/09/2022 2144 Last data filed at 12/09/2022 1951 Gross per 24 hour  Intake 100.81 ml  Output --  Net 100.81 ml    Labs/Imaging Results for orders placed or performed during the hospital encounter of 12/09/22 (from the past 48 hour(s))  Lactic acid, plasma     Status: Abnormal   Collection Time: 12/09/22  3:13 PM  Result Value Ref Range   Lactic Acid, Venous 2.6 (HH) 0.5 - 1.9 mmol/L    Comment: CRITICAL RESULT CALLED TO, READ BACK BY AND VERIFIED WITH  CHARLOTTE TURNER 1619 12/09/22 JV Performed at [facility], [address], [place], [place] [address]   CBC with Differential     Status: Abnormal   Collection Time: 12/09/22  3:36 PM  Result Value Ref Range   WBC 7.1 4.0 - 10.5 K/uL   RBC 2.58 (L) 4.22 - 5.81 MIL/uL   Hemoglobin 8.3 (L) 13.0 - 17.0 g/dL   HCT 01.0 (L) 93.2 - 35.5 %   MCV 103.1 (H) 80.0 - 100.0 fL   MCH 32.2 26.0 - 34.0 pg   MCHC 31.2 30.0 - 36.0 g/dL   RDW 73.2 (H) 20.2 - 54.2 %   Platelets 458 (H) 150 - 400 K/uL   nRBC 0.0 0.0 - 0.2 %   Neutrophils Relative % 70 %   Neutro Abs 4.9 1.7 - 7.7 K/uL   Lymphocytes Relative 21 %   Lymphs Abs 1.5 0.7 - 4.0 K/uL   Monocytes Relative 7 %   Monocytes Absolute 0.5 0.1 - 1.0 K/uL   Eosinophils Relative 1 %   Eosinophils Absolute 0.1 0.0 - 0.5 K/uL   Basophils Relative 1 %   Basophils Absolute 0.0 0.0 - 0.1 K/uL   Immature Granulocytes 0 %   Abs Immature Granulocytes 0.03 0.00 - 0.07 K/uL    Comment: Performed at [facility], [address], [place], [place] [address]  Comprehensive metabolic panel     Status: Abnormal   Collection Time:  12/09/22  3:36 PM  Result Value Ref Range   Sodium 136 135 - 145 mmol/L   Potassium 3.9 3.5 - 5.1 mmol/L   Chloride 98 98 - 111 mmol/L   CO2 25 22 - 32 mmol/L   Glucose, Bld 108 (H) 70 - 99 mg/dL    Comment: Glucose reference range applies only to samples taken after fasting for at least 8 hours.   BUN 33 (H) 8 - 23 mg/dL   Creatinine, Ser 7.62 0.61 - 1.24 mg/dL   Calcium 8.2 (L) 8.9 - 10.3  mg/dL   Total Protein 6.3 (L) 6.5 - 8.1 g/dL   Albumin 2.4 (L) 3.5 - 5.0 g/dL   AST 35 15 - 41 U/L   ALT 30 0 - 44 U/L   Alkaline Phosphatase 104 38 - 126 U/L   Total Bilirubin 0.9 0.3 - 1.2 mg/dL   GFR, Estimated >95 >28 mL/min    Comment: (NOTE) Calculated using the CKD-EPI Creatinine Equation (2021)    Anion gap 13 5 - 15    Comment: Performed at [facility], [address], [place], [place] [address]  Brain natriuretic peptide     Status: Abnormal   Collection Time: 12/09/22  3:36 PM  Result Value Ref Range   B Natriuretic Peptide 223.0 (H) 0.0 - 100.0 pg/mL    Comment: Performed at [facility], [address] [place], [place] [address]  Urinalysis, Routine w reflex microscopic -Urine, Clean Catch     Status: Abnormal   Collection Time: 12/09/22  4:41 PM  Result Value Ref Range   Color, Urine YELLOW YELLOW   APPearance CLOUDY (A) CLEAR   Specific Gravity, Urine 1.015 1.005 - 1.030   pH 5.0 5.0 - 8.0   Glucose, UA NEGATIVE NEGATIVE mg/dL   Hgb urine dipstick SMALL (A) NEGATIVE   Bilirubin Urine NEGATIVE NEGATIVE   Ketones, ur NEGATIVE NEGATIVE mg/dL   Protein, ur 725 (A) NEGATIVE mg/dL   Nitrite NEGATIVE NEGATIVE   Leukocytes,Ua LARGE (A) NEGATIVE   RBC / HPF 6-10 0 - 5 RBC/hpf   WBC, UA >50 0 - 5 WBC/hpf   Bacteria, UA RARE (A) NONE SEEN   Squamous Epithelial / HPF 0-5 0 - 5 /HPF   WBC Clumps PRESENT    Mucus PRESENT     Comment: Performed at [facility], [address], [place], [place] [address]  Resp panel by RT-PCR (RSV, Flu A&B, Covid) Anterior Nasal Swab     Status: None    Collection Time: 12/09/22  4:41 PM   Specimen: Anterior Nasal Swab  Result Value Ref Range   SARS Coronavirus 2 by RT PCR NEGATIVE NEGATIVE    Comment: (NOTE) SARS-CoV-2 target nucleic acids are NOT DETECTED.  The SARS-CoV-2 RNA is generally detectable in upper respiratory specimens during the acute phase of infection. The lowest concentration of SARS-CoV-2 viral copies this assay can detect is 138 copies/mL. A negative result does not preclude SARS-Cov-2 infection and should not be used as the sole basis for treatment or other patient management decisions. A negative result may occur with  improper specimen collection/handling, submission of specimen other than nasopharyngeal swab, presence of viral mutation(s) within the areas targeted by this assay, and inadequate number of viral copies(<138 copies/mL). A negative result must be combined with clinical observations, patient history, and epidemiological information. The expected result is Negative.  Fact Sheet for Patients:  BloggerCourse.com  Fact Sheet for Healthcare Providers:  SeriousBroker.it  This test is no t yet approved or cleared by the [place] FDA and  has been authorized for detection and/or diagnosis of SARS-CoV-2 by FDA under an Emergency Use Authorization (EUA). This EUA will remain  in effect (meaning this test can be used) for the duration of the COVID-19 declaration under Section 564(b)(1) of the Act, 21 U.S.C.section 360bbb-3(b)(1), unless the authorization is terminated  or revoked sooner.       Influenza A by PCR NEGATIVE NEGATIVE   Influenza B by PCR NEGATIVE NEGATIVE    Comment: (NOTE) The Xpert Xpress SARS-CoV-2/FLU/RSV plus assay  is intended as an aid in the diagnosis of influenza from Nasopharyngeal swab specimens and should not be used as a sole basis for treatment. Nasal washings and aspirates are unacceptable for Xpert Xpress  SARS-CoV-2/FLU/RSV testing.  Fact Sheet for Patients: BloggerCourse.com  Fact Sheet for Healthcare Providers: SeriousBroker.it  This test is not yet approved or cleared by the [place] FDA and has been authorized for detection and/or diagnosis of SARS-CoV-2 by FDA under an Emergency Use Authorization (EUA). This EUA will remain in effect (meaning this test can be used) for the duration of the COVID-19 declaration under Section 564(b)(1) of the Act, 21 U.S.C. section 360bbb-3(b)(1), unless the authorization is terminated or revoked.     Resp Syncytial Virus by PCR NEGATIVE NEGATIVE    Comment: (NOTE) Fact Sheet for Patients: BloggerCourse.com  Fact Sheet for Healthcare Providers: SeriousBroker.it  This test is not yet approved or cleared by the [place] FDA and has been authorized for detection and/or diagnosis of SARS-CoV-2 by FDA under an Emergency Use Authorization (EUA). This EUA will remain in effect (meaning this test can be used) for the duration of the COVID-19 declaration under Section 564(b)(1) of the Act, 21 U.S.C. section 360bbb-3(b)(1), unless the authorization is terminated or revoked.  Performed at [facility], [address], [place], [place] [address]   CBG monitoring, ED     Status: Abnormal   Collection Time: 12/09/22  5:21 PM  Result Value Ref Range   Glucose-Capillary 105 (H) 70 - 99 mg/dL    Comment: Glucose reference range applies only to samples taken after fasting for at least 8 hours.  Lactic acid, plasma     Status: Abnormal   Collection Time: 12/09/22  5:41 PM  Result Value Ref Range   Lactic Acid, Venous 2.2 (HH) 0.5 - 1.9 mmol/L    Comment: CRITICAL VALUE NOTED.  VALUE IS CONSISTENT WITH PREVIOUSLY REPORTED AND CALLED VALUE. Performed at [facility], [address], [place], [place] [address]    CT Head Wo Contrast  Result Date:  12/09/2022 CLINICAL DATA:  Mental status change, persistent or worsening EXAM: CT HEAD WITHOUT CONTRAST TECHNIQUE: Contiguous axial images were obtained from the base of the skull through the vertex without intravenous contrast. RADIATION DOSE REDUCTION: This exam was performed according to the departmental dose-optimization program which includes automated exposure control, adjustment of the mA and/or kV according to patient size and/or use of iterative reconstruction technique. COMPARISON:  12/09/2010 FINDINGS: Brain: There is atrophy and chronic small vessel disease changes. No acute intracranial abnormality. Specifically, no hemorrhage, hydrocephalus, mass lesion, acute infarction, or significant intracranial injury. Vascular: No hyperdense vessel or unexpected calcification. Skull: No acute calvarial abnormality. Sinuses/Orbits: No acute findings Other: None IMPRESSION: Atrophy, chronic microvascular disease. No acute intracranial abnormality. Electronically Signed   By: Charlett Nose M.D.   On: 12/09/2022 20:20   DG Chest Port 1 View  Result Date: 12/09/2022 CLINICAL DATA:  hypoxia EXAM: PORTABLE CHEST 1 VIEW COMPARISON:  11/24/2022. FINDINGS: Probable atelectatic changes at the left lung base with associated small left pleural effusion. Bilateral lungs are otherwise clear. Right lateral costophrenic angle is clear. Stable cardio-mediastinal silhouette. Aortic arch calcifications noted. No acute osseous abnormalities. The soft tissues are within normal limits. IMPRESSION: Probable atelectatic changes at the left lung base with associated small left pleural effusion. Electronically Signed   By: Jules Schick M.D.   On: 12/09/2022 16:40    Pending Labs [employer] (From admission, onward)     Start  Ordered   12/09/22 1503  Blood culture (routine x 2)  BLOOD CULTURE X 2,   R      12/09/22 1503            Vitals/Pain Today's Vitals   12/09/22 1815 12/09/22 1945 12/09/22 1951 12/09/22  2125  BP:  (!) 131/104  106/63  Pulse: 67 73 61   Resp: 19 16 13  (!) 22  Temp:    97.7 F (36.5 C)  TempSrc:    Rectal  SpO2: 100% 100% 100% 98%  Weight:      Height:        Isolation Precautions No active isolations  Medications Medications  furosemide (LASIX) injection 40 mg (40 mg Intravenous Not Given 12/09/22 2134)  lactated ringers bolus 1,000 mL (0 mLs Intravenous Stopped 12/09/22 1849)  cefTRIAXone (ROCEPHIN) 1 g in sodium chloride 0.9 % 100 mL IVPB (0 g Intravenous Stopped 12/09/22 1922)    Mobility non-ambulatory     Focused Assessments See provider note.   R Recommendations: See Admitting Provider Note  Report given to:   Additional Notes: Pt is normally able to converse per family but is altered x4 at this time, pt non-ambulatory with pillow heel protectors bilaterally, pt from [facility], patient unable to drink Ensure through straw earlier which is not normal per family that is at bedside.  Pt has wrapped stage 2 pressure ulcer to left heel, stage 2 pressure ulcer to right buttocks that is covered and packed wound that is tunneling with foul odor drainage to mid sacrum.  Pt on new 2L nasal cannula.

## 2022-12-09 NOTE — ED Notes (Signed)
RN assisted admitting MD to turn patient to document wounds.  Pt with stage 2 wound that is wrapped to right heel, stage 2 wound to right gluteal fold, and draining and tunneling wound that is packed with foul odor to sacral area.

## 2022-12-09 NOTE — ED Notes (Signed)
Pt placed on 2lnc. 

## 2022-12-09 NOTE — H&P (Incomplete)
History and Physical    Patient: [name] WFU:[id number] DOB: August 06, 1928 DOA: 12/09/2022 DOS: the patient was seen and examined on 12/09/2022 PCP: Elfredia Nevins, MD  Patient coming from: SNF  Chief Complaint:  Chief Complaint  Patient presents with  . Failure To Thrive   HPI: Jesse AVRAM Sr. is a 89 y.o. male with medical history significant of  paroxysmal atrial fibrillation on Eliquis, hypertension, peripheral arterial disease status post right fifth toe amputation, chronic indwelling Foley catheter, BPH, prostate cancer, sacral decubitus ulcer as well as right heel ulcer who presents to the emergency department from [facility] via EMS due to failure to thrive.  Patient was unable to provide history, history was obtained from daughter at bedside and from ED PA.  Per report, patient was reported to have had gradual decline from baseline functioning due to poor oral intake in the last few days and increased confusion and agitation since yesterday.  He was noted with increased hand tremors.  ED Course:  In the emergency department, patient was hemodynamically stable except for O2 sat at 86%-89% which improved to 95% to 100% with supplemental oxygen at 2 LPM.  Workup in the ED showed WBC 7.1, hemoglobin 8.3, hematocrit 26.6, MCV 103.1, platelets 458.  BMP was normal except for blood glucose of 108 and BUN of 33.  Albumin 2.4.  Urinalysis was positive for large leukocytes, WBC greater than 50 WBC/hpf, but rare bacteria.  Lactic acid 2.6 > 2.3 and BNP of 223. Influenza A, B, SARS coronavirus 2, RSV was negative. CT head without contrast showed no acute intracranial abnormality Chest x-ray showed probable telectatic changes at the left lung base with associated small left pleural effusion. Patient was treated with IV ceftriaxone and vancomycin, IV LR 1 L was given Hospitalist was asked admit patient for further evaluation and management.   Review of Systems: Review of systems as  noted in the HPI. All other systems reviewed and are negative.   Past Medical History:  Diagnosis Date  . Chronic indwelling Foley catheter    Followed by Urology  . Hypertension   . Kidney calculus 2014;2013  . PAD (peripheral artery disease) (HCC)    S/p R foot fifth ray amputation 08/2022 [facility])  . PAF (paroxysmal atrial fibrillation) (HCC)    On Eliquis  . Pneumonia   . Prostate cancer (HCC)   . Shortness of breath    Past Surgical History:  Procedure Laterality Date  . ABDOMINAL AORTOGRAM W/LOWER EXTREMITY N/A 09/02/2022   Procedure: ABDOMINAL AORTOGRAM W/LOWER EXTREMITY;  Surgeon: Victorino Sparrow, MD;  Location: [facility] INVASIVE CV LAB;  Service: Vascular;  Laterality: N/A;  . AMPUTATION Right 09/08/2022   Procedure: RIGHT FOOT FIFTH RAY AMPUTATION;  Surgeon: [name], MD;  Location: [facility] OR;  Service: Orthopedics;  Laterality: Right;  . CATARACT EXTRACTION W/PHACO Right 02/13/2013   Procedure: CATARACT EXTRACTION RIGHT EYE (WITH PHACO) AND INTRAOCULAR LENS PLACEMENT  CDE=14.53;  Surgeon: Loraine Leriche T. Nile Riggs, MD;  Location: [facility] ORS;  Service: Ophthalmology;  Laterality: Right;  . CATARACT EXTRACTION W/PHACO Left 02/27/2013   Procedure: CATARACT EXTRACTION PHACO AND INTRAOCULAR LENS PLACEMENT (IOC);  Surgeon: Loraine Leriche T. Nile Riggs, MD;  Location: [facility] ORS;  Service: Ophthalmology;  Laterality: Left;  CDE:12.54  . COLONOSCOPY    . JOINT REPLACEMENT Right 1993;1980   Right x2    Social History:  reports that he has never smoked. He has never used smokeless tobacco. He reports that he does not drink alcohol  and does not use drugs.   Allergies  Allergen Reactions  . Codeine Swelling    No family history on file.  ***  Prior to Admission medications   Medication Sig Start Date End Date Taking? Authorizing Provider  acetaminophen (TYLENOL) 325 MG tablet Take 2 tablets (650 mg total) by mouth every 6 (six) hours as needed for mild pain, moderate pain, fever or headache. 11/30/22    Elgergawy, Leana Roe, MD  alum & mag hydroxide-simeth (MAALOX/MYLANTA) 200-200-20 MG/5ML suspension Take 15-30 mLs by mouth every 2 (two) hours as needed for indigestion. Patient taking differently: Take 30 mLs by mouth every 2 (two) hours as needed for indigestion. 09/10/22   Pennie Banter, DO  Amino Acids-Protein Hydrolys (PRO-STAT PROFILE PO) Take 30 mLs by mouth 3 (three) times daily.    [provider]  apixaban (ELIQUIS) 5 MG TABS tablet Take 1 tablet (5 mg total) by mouth 2 (two) times daily. 09/10/22   Pennie Banter, DO  ascorbic acid (VITAMIN C) 1000 MG tablet Take 1 tablet (1,000 mg total) by mouth daily. 09/11/22   Pennie Banter, DO  atorvastatin (LIPITOR) 40 MG tablet Take 1 tablet (40 mg total) by mouth daily. 09/11/22   Pennie Banter, DO  furosemide (LASIX) 20 MG tablet Take 1 tablet (20 mg total) by mouth daily. 09/23/22   Love, Evlyn Kanner, PA-C  guaiFENesin-dextromethorphan (ROBITUSSIN DM) 100-10 MG/5ML syrup Take 15 mLs by mouth every 4 (four) hours as needed for cough.    [provider]  lactose free nutrition (BOOST) LIQD Take 237 mLs by mouth 2 (two) times daily.    [provider]  Multiple Vitamin (MULTIVITAMIN WITH MINERALS) TABS tablet Take 1 tablet by mouth daily. 09/11/22   Pennie Banter, DO  omega-3 acid ethyl esters (LOVAZA) 1 g capsule Take 1 capsule (1 g total) by mouth daily. 09/11/22   Esaw Grandchild A, DO  ondansetron (ZOFRAN) 4 MG tablet Take 4 mg by mouth every 6 (six) hours as needed for nausea or vomiting.    [provider]  pantoprazole (PROTONIX) 40 MG tablet Take 1 tablet (40 mg total) by mouth 2 (two) times daily. 09/23/22   Love, Evlyn Kanner, PA-C  polyethylene glycol (MIRALAX / GLYCOLAX) 17 g packet Take 17 g by mouth daily. Hold for loose BM.    [provider]  senna-docusate (SENOKOT-S) 8.6-50 MG tablet Take 1 tablet by mouth 2 (two) times daily. 09/10/22   Pennie Banter, DO  tamsulosin (FLOMAX) 0.4  MG CAPS capsule Take 0.4 mg by mouth daily after supper.    [provider]    Physical Exam: BP 123/80 (BP Location: Right Arm)   Pulse 63   Temp 97.8 F (36.6 C) (Axillary)   Resp 20   Ht 5\' 7"  (1.702 m)   Wt 88 kg   SpO2 91%   BMI 30.39 kg/m   General: 89 y.o. year-old male ill appearing, but in no acute distress.  Alert and oriented x2 (person and place). HEENT: NCAT, EOMI Neck: Supple, trachea medial Cardiovascular: Irregular rate and rhythm with no rubs or gallops.  No thyromegaly or JVD noted.  No lower extremity edema. 2/4 pulses in all 4 extremities. Respiratory: Clear to auscultation with no wheezes or rales. Abdomen: Soft, nontender nondistended with normal bowel sounds x4 quadrants. Muskuloskeletal: No cyanosis, clubbing or edema noted bilaterally Neuro: Tremors of all extremities, no focal neurologic deficit.  Sensation, reflexes intact Skin: Stage IV sacral  decubitus ulcer, left heel ulcer, left great toe wound. Psychiatry: Mood is appropriate for condition and setting          Labs on Admission:  Basic Metabolic Panel: Recent Labs  Lab 12/09/22 1536  NA 136  K 3.9  CL 98  CO2 25  GLUCOSE 108*  BUN 33*  CREATININE 0.86  CALCIUM 8.2*   Liver Function Tests: Recent Labs  Lab 12/09/22 1536  AST 35  ALT 30  ALKPHOS 104  BILITOT 0.9  PROT 6.3*  ALBUMIN 2.4*   No results for input(s): "LIPASE", "AMYLASE" in the last 168 hours. No results for input(s): "AMMONIA" in the last 168 hours. CBC: Recent Labs  Lab 12/09/22 1536  WBC 7.1  NEUTROABS 4.9  HGB 8.3*  HCT 26.6*  MCV 103.1*  PLT 458*   Cardiac Enzymes: No results for input(s): "CKTOTAL", "CKMB", "CKMBINDEX", "TROPONINI" in the last 168 hours.  BNP (last 3 results) Recent Labs    08/24/22 1542 09/22/22 1237 12/09/22 1536  BNP 160.0* 196.2* 223.0*    ProBNP (last 3 results) No results for input(s): "PROBNP" in the last 8760 hours.  CBG: Recent Labs  Lab 12/09/22 1721   GLUCAP 105*    Radiological Exams on Admission: CT Head Wo Contrast  Result Date: 12/09/2022 CLINICAL DATA:  Mental status change, persistent or worsening EXAM: CT HEAD WITHOUT CONTRAST TECHNIQUE: Contiguous axial images were obtained from the base of the skull through the vertex without intravenous contrast. RADIATION DOSE REDUCTION: This exam was performed according to the departmental dose-optimization program which includes automated exposure control, adjustment of the mA and/or kV according to patient size and/or use of iterative reconstruction technique. COMPARISON:  12/09/2010 FINDINGS: Brain: There is atrophy and chronic small vessel disease changes. No acute intracranial abnormality. Specifically, no hemorrhage, hydrocephalus, mass lesion, acute infarction, or significant intracranial injury. Vascular: No hyperdense vessel or unexpected calcification. Skull: No acute calvarial abnormality. Sinuses/Orbits: No acute findings Other: None IMPRESSION: Atrophy, chronic microvascular disease. No acute intracranial abnormality. Electronically Signed   By: Charlett Nose M.D.   On: 12/09/2022 20:20   DG Chest Port 1 View  Result Date: 12/09/2022 CLINICAL DATA:  hypoxia EXAM: PORTABLE CHEST 1 VIEW COMPARISON:  11/24/2022. FINDINGS: Probable atelectatic changes at the left lung base with associated small left pleural effusion. Bilateral lungs are otherwise clear. Right lateral costophrenic angle is clear. Stable cardio-mediastinal silhouette. Aortic arch calcifications noted. No acute osseous abnormalities. The soft tissues are within normal limits. IMPRESSION: Probable atelectatic changes at the left lung base with associated small left pleural effusion. Electronically Signed   By: Jules Schick M.D.   On: 12/09/2022 16:40    EKG: I independently viewed the EKG done and my findings are as followed: ***   Assessment/Plan Present on Admission: . Acute respiratory failure with hypoxia (HCC)  Principal  Problem:   Acute respiratory failure with hypoxia (HCC)   DVT prophylaxis: ***   Code Status: ***   Family Communication: ***   Disposition Plan: ***   Consults called: ***   Admission status: ***     Frankey Shown MD [facility] Pager [phone]  If 7PM-7AM, please contact night-coverage [email or web address] Password [facility]  12/09/2022, 11:34 PM        Review of Systems: {ROS_Text:26778} Past Medical History:  Diagnosis Date  . Chronic indwelling Foley catheter    Followed by Urology  . Hypertension   . Kidney calculus 2014;2013  . PAD (peripheral artery disease) (HCC)    S/p  R foot fifth ray amputation 08/2022 [facility])  . PAF (paroxysmal atrial fibrillation) (HCC)    On Eliquis  . Pneumonia   . Prostate cancer (HCC)   . Shortness of breath    Past Surgical History:  Procedure Laterality Date  . ABDOMINAL AORTOGRAM W/LOWER EXTREMITY N/A 09/02/2022   Procedure: ABDOMINAL AORTOGRAM W/LOWER EXTREMITY;  Surgeon: Victorino Sparrow, MD;  Location: [facility] INVASIVE CV LAB;  Service: Vascular;  Laterality: N/A;  . AMPUTATION Right 09/08/2022   Procedure: RIGHT FOOT FIFTH RAY AMPUTATION;  Surgeon: [name], MD;  Location: [facility] OR;  Service: Orthopedics;  Laterality: Right;  . CATARACT EXTRACTION W/PHACO Right 02/13/2013   Procedure: CATARACT EXTRACTION RIGHT EYE (WITH PHACO) AND INTRAOCULAR LENS PLACEMENT  CDE=14.53;  Surgeon: Loraine Leriche T. Nile Riggs, MD;  Location: [facility] ORS;  Service: Ophthalmology;  Laterality: Right;  . CATARACT EXTRACTION W/PHACO Left 02/27/2013   Procedure: CATARACT EXTRACTION PHACO AND INTRAOCULAR LENS PLACEMENT (IOC);  Surgeon: Loraine Leriche T. Nile Riggs, MD;  Location: [facility] ORS;  Service: Ophthalmology;  Laterality: Left;  CDE:12.54  . COLONOSCOPY    . JOINT REPLACEMENT Right 1993;1980   Right x2   Social History:  reports that he has never smoked. He has never used smokeless tobacco. He reports that he does not drink alcohol and does not use drugs.  Allergies   Allergen Reactions  . Codeine Swelling    No family history on file.  Prior to Admission medications   Medication Sig Start Date End Date Taking? Authorizing Provider  acetaminophen (TYLENOL) 325 MG tablet Take 2 tablets (650 mg total) by mouth every 6 (six) hours as needed for mild pain, moderate pain, fever or headache. 11/30/22   Elgergawy, Leana Roe, MD  alum & mag hydroxide-simeth (MAALOX/MYLANTA) 200-200-20 MG/5ML suspension Take 15-30 mLs by mouth every 2 (two) hours as needed for indigestion. Patient taking differently: Take 30 mLs by mouth every 2 (two) hours as needed for indigestion. 09/10/22   Pennie Banter, DO  Amino Acids-Protein Hydrolys (PRO-STAT PROFILE PO) Take 30 mLs by mouth 3 (three) times daily.    [provider]  apixaban (ELIQUIS) 5 MG TABS tablet Take 1 tablet (5 mg total) by mouth 2 (two) times daily. 09/10/22   Pennie Banter, DO  ascorbic acid (VITAMIN C) 1000 MG tablet Take 1 tablet (1,000 mg total) by mouth daily. 09/11/22   Pennie Banter, DO  atorvastatin (LIPITOR) 40 MG tablet Take 1 tablet (40 mg total) by mouth daily. 09/11/22   Pennie Banter, DO  furosemide (LASIX) 20 MG tablet Take 1 tablet (20 mg total) by mouth daily. 09/23/22   Love, Evlyn Kanner, PA-C  guaiFENesin-dextromethorphan (ROBITUSSIN DM) 100-10 MG/5ML syrup Take 15 mLs by mouth every 4 (four) hours as needed for cough.    [provider]  lactose free nutrition (BOOST) LIQD Take 237 mLs by mouth 2 (two) times daily.    [provider]  Multiple Vitamin (MULTIVITAMIN WITH MINERALS) TABS tablet Take 1 tablet by mouth daily. 09/11/22   Pennie Banter, DO  omega-3 acid ethyl esters (LOVAZA) 1 g capsule Take 1 capsule (1 g total) by mouth daily. 09/11/22   Esaw Grandchild A, DO  ondansetron (ZOFRAN) 4 MG tablet Take 4 mg by mouth every 6 (six) hours as needed for nausea or vomiting.    [provider]  pantoprazole (PROTONIX) 40 MG tablet Take 1 tablet (40 mg  total) by mouth 2 (two) times daily. 09/23/22   Jacquelynn Cree,  PA-C  polyethylene glycol (MIRALAX / GLYCOLAX) 17 g packet Take 17 g by mouth daily. Hold for loose BM.    [provider]  senna-docusate (SENOKOT-S) 8.6-50 MG tablet Take 1 tablet by mouth 2 (two) times daily. 09/10/22   Pennie Banter, DO  tamsulosin (FLOMAX) 0.4 MG CAPS capsule Take 0.4 mg by mouth daily after supper.    [provider]    Physical Exam: Vitals:   12/09/22 1802 12/09/22 1815 12/09/22 1945 12/09/22 1951  BP:   (!) 131/104   Pulse:  67 73 61  Resp:  19 16 13   Temp:      TempSrc:      SpO2: 100% 100% 100% 100%  Weight:      Height:       *** Data Reviewed: {Tip this will not be part of the note when signed- Document your independent interpretation of telemetry tracing, EKG, lab, Radiology test or any other diagnostic tests. Add any new diagnostic test ordered today. (Optional):26781} {Results:26384}  Assessment and Plan: No notes have been filed under this hospital service. Service: Hospitalist     Advance Care Planning:   Code Status: Prior ***  Consults: ***  Family Communication: ***  Severity of Illness: {Observation/Inpatient:21159}  Author: Frankey Shown, DO 12/09/2022 9:01 PM  For on call review [email or web address].

## 2022-12-10 ENCOUNTER — Other Ambulatory Visit (HOSPITAL_COMMUNITY): Payer: Medicare Other

## 2022-12-10 ENCOUNTER — Other Ambulatory Visit (HOSPITAL_COMMUNITY): Payer: Self-pay | Admitting: *Deleted

## 2022-12-10 ENCOUNTER — Inpatient Hospital Stay (HOSPITAL_COMMUNITY): Payer: Medicare Other

## 2022-12-10 DIAGNOSIS — J9601 Acute respiratory failure with hypoxia: Secondary | ICD-10-CM | POA: Diagnosis not present

## 2022-12-10 DIAGNOSIS — K219 Gastro-esophageal reflux disease without esophagitis: Secondary | ICD-10-CM | POA: Insufficient documentation

## 2022-12-10 DIAGNOSIS — R7989 Other specified abnormal findings of blood chemistry: Secondary | ICD-10-CM | POA: Insufficient documentation

## 2022-12-10 DIAGNOSIS — L89154 Pressure ulcer of sacral region, stage 4: Secondary | ICD-10-CM | POA: Diagnosis not present

## 2022-12-10 DIAGNOSIS — T83511A Infection and inflammatory reaction due to indwelling urethral catheter, initial encounter: Secondary | ICD-10-CM | POA: Diagnosis not present

## 2022-12-10 DIAGNOSIS — I480 Paroxysmal atrial fibrillation: Secondary | ICD-10-CM | POA: Diagnosis not present

## 2022-12-10 DIAGNOSIS — L89159 Pressure ulcer of sacral region, unspecified stage: Secondary | ICD-10-CM | POA: Insufficient documentation

## 2022-12-10 DIAGNOSIS — R627 Adult failure to thrive: Secondary | ICD-10-CM | POA: Insufficient documentation

## 2022-12-10 DIAGNOSIS — E8809 Other disorders of plasma-protein metabolism, not elsewhere classified: Secondary | ICD-10-CM | POA: Insufficient documentation

## 2022-12-10 DIAGNOSIS — E782 Mixed hyperlipidemia: Secondary | ICD-10-CM | POA: Insufficient documentation

## 2022-12-10 DIAGNOSIS — N390 Urinary tract infection, site not specified: Secondary | ICD-10-CM | POA: Insufficient documentation

## 2022-12-10 LAB — VITAMIN B12: Vitamin B-12: 1188 pg/mL — ABNORMAL HIGH (ref 180–914)

## 2022-12-10 LAB — GLUCOSE, CAPILLARY
Glucose-Capillary: 75 mg/dL (ref 70–99)
Glucose-Capillary: 80 mg/dL (ref 70–99)
Glucose-Capillary: 85 mg/dL (ref 70–99)
Glucose-Capillary: 92 mg/dL (ref 70–99)
Glucose-Capillary: 92 mg/dL (ref 70–99)

## 2022-12-10 LAB — CBC
HCT: 22.5 % — ABNORMAL LOW (ref 39.0–52.0)
Hemoglobin: 6.9 g/dL — CL (ref 13.0–17.0)
MCH: 31.8 pg (ref 26.0–34.0)
MCHC: 30.7 g/dL (ref 30.0–36.0)
MCV: 103.7 fL — ABNORMAL HIGH (ref 80.0–100.0)
Platelets: 392 10*3/uL (ref 150–400)
RBC: 2.17 MIL/uL — ABNORMAL LOW (ref 4.22–5.81)
RDW: 20.1 % — ABNORMAL HIGH (ref 11.5–15.5)
WBC: 7.8 10*3/uL (ref 4.0–10.5)
nRBC: 0 % (ref 0.0–0.2)

## 2022-12-10 LAB — COMPREHENSIVE METABOLIC PANEL
ALT: 26 U/L (ref 0–44)
AST: 30 U/L (ref 15–41)
Albumin: 2.1 g/dL — ABNORMAL LOW (ref 3.5–5.0)
Alkaline Phosphatase: 93 U/L (ref 38–126)
Anion gap: 10 (ref 5–15)
BUN: 31 mg/dL — ABNORMAL HIGH (ref 8–23)
CO2: 26 mmol/L (ref 22–32)
Calcium: 8.1 mg/dL — ABNORMAL LOW (ref 8.9–10.3)
Chloride: 101 mmol/L (ref 98–111)
Creatinine, Ser: 0.8 mg/dL (ref 0.61–1.24)
GFR, Estimated: >60 mL/min (ref >60)
Glucose, Bld: 82 mg/dL (ref 70–99)
Potassium: 3.5 mmol/L (ref 3.5–5.1)
Sodium: 137 mmol/L (ref 135–145)
Total Bilirubin: 1.1 mg/dL (ref 0.3–1.2)
Total Protein: 5.4 g/dL — ABNORMAL LOW (ref 6.5–8.1)

## 2022-12-10 LAB — PHOSPHORUS: Phosphorus: 2.7 mg/dL (ref 2.5–4.6)

## 2022-12-10 LAB — FOLATE: Folate: 8 ng/mL (ref >5.9)

## 2022-12-10 LAB — HEMOGLOBIN AND HEMATOCRIT, BLOOD
HCT: 21.8 % — ABNORMAL LOW (ref 39.0–52.0)
Hemoglobin: 6.7 g/dL — CL (ref 13.0–17.0)

## 2022-12-10 LAB — MAGNESIUM: Magnesium: 1.8 mg/dL (ref 1.7–2.4)

## 2022-12-10 LAB — LACTIC ACID, PLASMA: Lactic Acid, Venous: 1.4 mmol/L (ref 0.5–1.9)

## 2022-12-10 LAB — PREPARE RBC (CROSSMATCH)

## 2022-12-10 MED ORDER — SODIUM CHLORIDE 0.9% IV SOLUTION: Freq: Once | INTRAVENOUS | Status: AC

## 2022-12-10 MED ORDER — ATORVASTATIN CALCIUM 40 MG PO TABS
40.0000 mg | ORAL_TABLET | Freq: Every day | ORAL | Status: DC
  Administered 2022-12-10 – 2022-12-12 (×3): 40 mg via ORAL
  Filled 2022-12-10 (×4): qty 1

## 2022-12-10 MED ORDER — FUROSEMIDE 10 MG/ML IJ SOLN
20.0000 mg | Freq: Once | INTRAMUSCULAR | Status: AC
  Administered 2022-12-10: 20 mg via INTRAVENOUS
  Filled 2022-12-10: qty 2

## 2022-12-10 MED ORDER — PANTOPRAZOLE SODIUM 40 MG PO TBEC
40.0000 mg | DELAYED_RELEASE_TABLET | Freq: Two times a day (BID) | ORAL | Status: DC
  Administered 2022-12-10 – 2022-12-12 (×4): 40 mg via ORAL
  Filled 2022-12-10 (×6): qty 1

## 2022-12-10 MED ORDER — CHLORHEXIDINE GLUCONATE CLOTH 2 % EX PADS
6.0000 | MEDICATED_PAD | Freq: Every day | CUTANEOUS | Status: DC
  Administered 2022-12-10 – 2022-12-12 (×3): 6 via TOPICAL

## 2022-12-10 MED ORDER — MEDIHONEY WOUND/BURN DRESSING EX PSTE
1.0000 | PASTE | Freq: Every day | CUTANEOUS | Status: DC
  Administered 2022-12-10 – 2022-12-12 (×3): 1 via TOPICAL
  Filled 2022-12-10: qty 44

## 2022-12-10 MED ORDER — SODIUM CHLORIDE 0.9 % IV SOLN
1.0000 g | INTRAVENOUS | Status: DC
  Administered 2022-12-10 – 2022-12-11 (×2): 1 g via INTRAVENOUS
  Filled 2022-12-10 (×2): qty 10

## 2022-12-10 MED ORDER — ONDANSETRON HCL 4 MG/2ML IJ SOLN: 4.0000 mg | Freq: Four times a day (QID) | INTRAMUSCULAR | Status: DC | PRN

## 2022-12-10 MED ORDER — OMEGA-3-ACID ETHYL ESTERS 1 G PO CAPS
1.0000 g | ORAL_CAPSULE | Freq: Every day | ORAL | Status: DC
  Administered 2022-12-10 – 2022-12-12 (×2): 1 g via ORAL
  Filled 2022-12-10 (×3): qty 1

## 2022-12-10 MED ORDER — ONDANSETRON HCL 4 MG PO TABS: 4.0000 mg | ORAL_TABLET | Freq: Four times a day (QID) | ORAL | Status: DC | PRN

## 2022-12-10 MED ORDER — GADOBUTROL 1 MMOL/ML IV SOLN
8.0000 mL | Freq: Once | INTRAVENOUS | Status: AC | PRN
  Administered 2022-12-10: 8 mL via INTRAVENOUS

## 2022-12-10 MED ORDER — DEXTROSE-SODIUM CHLORIDE 5-0.9 % IV SOLN: INTRAVENOUS | Status: AC

## 2022-12-10 MED ORDER — APIXABAN 5 MG PO TABS
5.0000 mg | ORAL_TABLET | Freq: Two times a day (BID) | ORAL | Status: DC
  Administered 2022-12-10 – 2022-12-12 (×5): 5 mg via ORAL
  Filled 2022-12-10 (×7): qty 1

## 2022-12-10 MED ORDER — ACETAMINOPHEN 650 MG RE SUPP: 650.0000 mg | Freq: Four times a day (QID) | RECTAL | Status: DC | PRN

## 2022-12-10 MED ORDER — ACETAMINOPHEN 325 MG PO TABS
650.0000 mg | ORAL_TABLET | Freq: Four times a day (QID) | ORAL | Status: DC | PRN
  Administered 2022-12-11: 650 mg via ORAL
  Filled 2022-12-10: qty 2

## 2022-12-10 MED ORDER — VANCOMYCIN HCL IN DEXTROSE 1-5 GM/200ML-% IV SOLN
1000.0000 mg | INTRAVENOUS | Status: DC
  Administered 2022-12-10 – 2022-12-11 (×2): 1000 mg via INTRAVENOUS
  Filled 2022-12-10 (×2): qty 200

## 2022-12-10 NOTE — Consult Note (Addendum)
WOC Nurse Consult Note: patient admitted from [facility] with chronic wounds; known to [facility] team from previous admission  Reason for Consult: L heel, L great toe, sacral wound Wound type: 1.  L heel Unstageable Pressure Injury  2. Left great toe full thickness covered in Eschar likely arterial or diabetic  3.  R lateral heel Unstageable Pressure Injury  4.  Sacral Stage 4 Pressure Injury   5.  R ischial Unstageable Pressure Injury (formerly noted as Stage 3 PI last admission) Pressure Injury POA: Yes Measurement: 1. L heel Unstageable PI 2 cm x 2 cm 100% black soft eschar  2.  L great toe full thickness 2 cm x 2 cm 100% moveable eschar  3.  R lateral heel/medial foot Unstageable Pressure Injury 1 cm x 3 cm 100% black stable eschar  4.  Sacrum Stage 4 Pressure Injury 4 cm x 7 cm x 1 cm 60% yellow/gray necrotic tissue 40% pink moist  5.  R ischial Unstageable PI 1 cm x 1 cm 75% yellow sough 25% pink moist   Drainage (amount, consistency, odor) purulent drainage noted to L great toe when eschar is touched, no drainage either heel; tan exudate to sacral wound and R ischial wound  Periwound: dry skin to feet,  Dressing procedure/placement/frequency:  Clean L great toe with NS, paint with Betadine twice daily.  Clean B heel wounds with NS apply Xeroform gauze [place] [phone]) to wound beds, cover with dry gauze and wrap in Kerlix or silicone foam.  Place bilateral feet in Prevalon boots.  Clean R ischial and sacral wounds with NS, apply Medihoney to wound beds daily, fill in sacral wound with dry fluffed gauze.  Cover both wounds with dry gauze and silicone foam or ABD pad whichever is preferred. May lift silicone foam daily to replace Medihoney.  Change foam dressing q3 days and prn soiling.   Patient should be placed on a low air loss mattress for pressure redistribution and moisture management. Patient wearing Prevalon boots at the time of this visit.   POC discussed with bedside nurse and  primary MD. WOC team will not follow at this time  Re-consult if further needs arise   Thank you,    Priscella Mann MSN, RN-BC, [employer] [phone]

## 2022-12-10 NOTE — Progress Notes (Signed)
PROGRESS NOTE  [name] YQM:[id number] DOB: 1929/03/21 DOA: 12/09/2022 PCP: Elfredia Nevins, MD  Brief History:  89 year old male with a history of hypertension, paroxysmal atrial fibrillation on Eliquis, peripheral arterial disease status post right fifth toe amputation, chronic indwelling Foley catheter, BPH, prostate cancer who was recently admitted hospital in May 2024 for critical limb ischemia of the right lower extremity with osteomyelitis requiring ray amputation was discharged to inpatient rehab between 09/10/2022 to 09/24/2022 and then subsequently to skilled nursing facility.   The patient has had a functional decline since the beginning of 2024.  He presents from [facility] with increasing confusion and agitation, decreased oral intake for at least 2 days. Daughter states he was confused and agitated like this during his last admission. Notably, the patient was admitted to the hospital from 11/24/2022 to 11/30/2022 with septic shock secondary to CAUTI.  The patient had Proteus bacteremia from urinary source.  In the ED, the patient was afebrile and hemodynamically stable with oxygen saturation 89% room air.  He was placed on 2 L. BMP showed sodium 137, potassium 3.5, bicarbonate 26, serum creatinine 0.80.  WBC 7.1, hemoglobin 8.3, platelets 1-52,000.  Lactic acid peaked at 2.6.  UA showed >50 WBC, CT of the brain was negative.  Chest x-ray showed left basilar atelectasis.  Blood cultures were obtained.  The patient was started on vancomycin and ceftriaxone.   Assessment/Plan: Acute metabolic encephalopathy -Multifactorial including UTI, dehydration -Patient remains confused -Serum B12 1188 -Folic acid 8.0  CAUTI -Patient has chronic indwelling Foley catheter -UA> 50 WBC -continue empiric vanc/ceftriaxone pending cultures  Lactic acidosis -Presented with serum lactate 2.6>> 1.4 -Continue IV fluids  Stage IV sacral decubitus -Present on admission -Wound  care consult -Does not appear infected on exam  Left heel decubitus -Wound care consult -He has eschar type lesion--clinically not infected  Paroxysmal atrial fibrillation -Followed as outpatient by Dr. Rosemary Holms, last seen 7/22. -CHA2DS2-VASc Score 5.  -Continue telemetry.  -08/26/2022 echo EF 65 to 70%, no WMA, normal RVF, mild MR -continue apixaban  PAD/Critical limb ischemia of RLE requiring fifth partial ray amputation History of osteomyelitis -Patient had underwent abdominal aortogram with runoff 5/16 with [facility] Karin Lieu) with recanalization and ballon angioplasty of tibioperoneal trunk. -Ultimately required R fifth partial ray amputation 5/22 with Ortho Lajoyce Corners).  Prostate CA Follows up with Dr. Retta Diones urology as outpatient.  Has progressive prostate cancer.  Bone imaging was  negative on 2023.  Not on Flomax.  Currently on Foley catheter.  Acute on chronic anemia -no signs of active blood loss -likely dilution -transfuse one unit PRBC -continue apixaban for now  Physical deconditioning -Discharged to inpatient rehab (5/24 - 6/7) then to SNF.  -General functional decline since the beginning of 2024.       Family Communication:  no Family at bedside  Consultants:  palliative  Code Status:  FULL  DVT Prophylaxis:  apixaban   Procedures: As Listed in Progress Note Above  Antibiotics: Vanc 8/22>> Ceftriaxone 8/22>>>        Subjective: Patient is awake.  He is pleasantly confused.  Denies any chest pain, abdominal pain, shortness of breath.  Remainder review of systems is unobtainable secondary to encephalopathy.  Objective: Vitals:   12/09/22 2241 12/10/22 0056 12/10/22 0205 12/10/22 0611  BP: 123/80  (!) 149/95 (!) 121/52  Pulse: 63   (!) 48  Resp: 20  19 16   Temp: 97.8 F (36.6 C)  98.4 F (36.9 C) 97.6 F (36.4 C)  TempSrc: Axillary     SpO2: 91% 100% 100% 100%  Weight: 88 kg   85.5 kg  Height:        Intake/Output Summary (Last 24 hours)  at 12/10/2022 0858 Last data filed at 12/10/2022 0500 Gross per 24 hour  Intake 640.81 ml  Output 750 ml  Net -109.19 ml   Weight change:  Exam:  General:  Pt is alert, intermittently follows commands appropriately, not in acute distress HEENT: No icterus, No thrush, No neck mass, [place]/AT Cardiovascular: RRR, S1/S2, no rubs, no gallops Respiratory: Bibasilar crackles.  No wheeze Abdomen: Soft/+BS, non tender, non distended, no guarding Extremities: 1 + LE edema, No lymphangitis, No petechiae, No rashes, no synovitis  Left heel  Sacrum   Data Reviewed: I have personally reviewed following labs and imaging studies Basic Metabolic Panel: Recent Labs  Lab 12/09/22 1536 12/10/22 0052  NA 136 137  K 3.9 3.5  CL 98 101  CO2 25 26  GLUCOSE 108* 82  BUN 33* 31*  CREATININE 0.86 0.80  CALCIUM 8.2* 8.1*  MG  --  1.8  PHOS  --  2.7   Liver Function Tests: Recent Labs  Lab 12/09/22 1536 12/10/22 0052  AST 35 30  ALT 30 26  ALKPHOS 104 93  BILITOT 0.9 1.1  PROT 6.3* 5.4*  ALBUMIN 2.4* 2.1*   No results for input(s): "LIPASE", "AMYLASE" in the last 168 hours. No results for input(s): "AMMONIA" in the last 168 hours. Coagulation Profile: No results for input(s): "INR", "PROTIME" in the last 168 hours. CBC: Recent Labs  Lab 12/09/22 1536 12/10/22 0052 12/10/22 0447  WBC 7.1 7.8  --   NEUTROABS 4.9  --   --   HGB 8.3* 6.9* 6.7*  HCT 26.6* 22.5* 21.8*  MCV 103.1* 103.7*  --   PLT 458* 392  --    Cardiac Enzymes: No results for input(s): "CKTOTAL", "CKMB", "CKMBINDEX", "TROPONINI" in the last 168 hours. BNP: Invalid input(s): "POCBNP" CBG: Recent Labs  Lab 12/09/22 1721 12/10/22 0038 12/10/22 0609  GLUCAP 105* 92 75   HbA1C: No results for input(s): "HGBA1C" in the last 72 hours. Urine analysis:    Component Value Date/Time   COLORURINE YELLOW 12/09/2022 1641   APPEARANCEUR CLOUDY (A) 12/09/2022 1641   APPEARANCEUR Clear 07/06/2022 1303   LABSPEC 1.015  12/09/2022 1641   PHURINE 5.0 12/09/2022 1641   GLUCOSEU NEGATIVE 12/09/2022 1641   HGBUR SMALL (A) 12/09/2022 1641   BILIRUBINUR NEGATIVE 12/09/2022 1641   BILIRUBINUR Negative 07/06/2022 1303   KETONESUR NEGATIVE 12/09/2022 1641   PROTEINUR 100 (A) 12/09/2022 1641   UROBILINOGEN 0.2 01/19/2010 1950   NITRITE NEGATIVE 12/09/2022 1641   LEUKOCYTESUR LARGE (A) 12/09/2022 1641   Sepsis Labs: @LABRCNTIP (procalcitonin:4,lacticidven:4) ) Recent Results (from the past 240 hour(s))  Blood culture (routine x 2)     Status: None (Preliminary result)   Collection Time: 12/09/22  3:13 PM   Specimen: BLOOD  Result Value Ref Range Status   Specimen Description BLOOD RFOA  Final   Special Requests   Final    BOTTLES DRAWN AEROBIC AND ANAEROBIC Blood Culture adequate volume   Culture   Final    NO GROWTH < 24 HOURS Performed at [facility], [address], [place], [place] [address]    Report Status PENDING  Incomplete  Blood culture (routine x 2)     Status: None (Preliminary result)   Collection Time: 12/09/22  3:13 PM   Specimen: BLOOD  Result Value Ref Range Status   Specimen Description BLOOD RW  Final   Special Requests   Final    BOTTLES DRAWN AEROBIC AND ANAEROBIC Blood Culture adequate volume   Culture   Final    NO GROWTH < 24 HOURS Performed at [facility], [address] [place], [place] [address]    Report Status PENDING  Incomplete  Resp panel by RT-PCR (RSV, Flu A&B, Covid) Anterior Nasal Swab     Status: None   Collection Time: 12/09/22  4:41 PM   Specimen: Anterior Nasal Swab  Result Value Ref Range Status   SARS Coronavirus 2 by RT PCR NEGATIVE NEGATIVE Final    Comment: (NOTE) SARS-CoV-2 target nucleic acids are NOT DETECTED.  The SARS-CoV-2 RNA is generally detectable in upper respiratory specimens during the acute phase of infection. The lowest concentration of SARS-CoV-2 viral copies this assay can detect is 138 copies/mL. A negative result does not preclude  SARS-Cov-2 infection and should not be used as the sole basis for treatment or other patient management decisions. A negative result may occur with  improper specimen collection/handling, submission of specimen other than nasopharyngeal swab, presence of viral mutation(s) within the areas targeted by this assay, and inadequate number of viral copies(<138 copies/mL). A negative result must be combined with clinical observations, patient history, and epidemiological information. The expected result is Negative.  Fact Sheet for Patients:  BloggerCourse.com  Fact Sheet for Healthcare Providers:  SeriousBroker.it  This test is no t yet approved or cleared by the [place] FDA and  has been authorized for detection and/or diagnosis of SARS-CoV-2 by FDA under an Emergency Use Authorization (EUA). This EUA will remain  in effect (meaning this test can be used) for the duration of the COVID-19 declaration under Section 564(b)(1) of the Act, 21 U.S.C.section 360bbb-3(b)(1), unless the authorization is terminated  or revoked sooner.       Influenza A by PCR NEGATIVE NEGATIVE Final   Influenza B by PCR NEGATIVE NEGATIVE Final    Comment: (NOTE) The Xpert Xpress SARS-CoV-2/FLU/RSV plus assay is intended as an aid in the diagnosis of influenza from Nasopharyngeal swab specimens and should not be used as a sole basis for treatment. Nasal washings and aspirates are unacceptable for Xpert Xpress SARS-CoV-2/FLU/RSV testing.  Fact Sheet for Patients: BloggerCourse.com  Fact Sheet for Healthcare Providers: SeriousBroker.it  This test is not yet approved or cleared by the [place] FDA and has been authorized for detection and/or diagnosis of SARS-CoV-2 by FDA under an Emergency Use Authorization (EUA). This EUA will remain in effect (meaning this test can be used) for the duration of  the COVID-19 declaration under Section 564(b)(1) of the Act, 21 U.S.C. section 360bbb-3(b)(1), unless the authorization is terminated or revoked.     Resp Syncytial Virus by PCR NEGATIVE NEGATIVE Final    Comment: (NOTE) Fact Sheet for Patients: BloggerCourse.com  Fact Sheet for Healthcare Providers: SeriousBroker.it  This test is not yet approved or cleared by the [place] FDA and has been authorized for detection and/or diagnosis of SARS-CoV-2 by FDA under an Emergency Use Authorization (EUA). This EUA will remain in effect (meaning this test can be used) for the duration of the COVID-19 declaration under Section 564(b)(1) of the Act, 21 U.S.C. section 360bbb-3(b)(1), unless the authorization is terminated or revoked.  Performed at [facility], [address], [place], [place] [address]      Scheduled Meds:  sodium chloride  Intravenous Once   apixaban  5 mg Oral BID   atorvastatin  40 mg Oral Daily   Chlorhexidine Gluconate Cloth  6 each Topical Q0600   omega-3 acid ethyl esters  1 g Oral Daily   pantoprazole  40 mg Oral BID   Continuous Infusions:  cefTRIAXone (ROCEPHIN)  IV     dextrose 5 % and 0.9 % NaCl 50 mL/hr at 12/10/22 0708   vancomycin      Procedures/Studies: CT Head Wo Contrast  Result Date: 12/09/2022 CLINICAL DATA:  Mental status change, persistent or worsening EXAM: CT HEAD WITHOUT CONTRAST TECHNIQUE: Contiguous axial images were obtained from the base of the skull through the vertex without intravenous contrast. RADIATION DOSE REDUCTION: This exam was performed according to the departmental dose-optimization program which includes automated exposure control, adjustment of the mA and/or kV according to patient size and/or use of iterative reconstruction technique. COMPARISON:  12/09/2010 FINDINGS: Brain: There is atrophy and chronic small vessel disease changes. No acute intracranial abnormality.  Specifically, no hemorrhage, hydrocephalus, mass lesion, acute infarction, or significant intracranial injury. Vascular: No hyperdense vessel or unexpected calcification. Skull: No acute calvarial abnormality. Sinuses/Orbits: No acute findings Other: None IMPRESSION: Atrophy, chronic microvascular disease. No acute intracranial abnormality. Electronically Signed   By: Charlett Nose M.D.   On: 12/09/2022 20:20   DG Chest Port 1 View  Result Date: 12/09/2022 CLINICAL DATA:  hypoxia EXAM: PORTABLE CHEST 1 VIEW COMPARISON:  11/24/2022. FINDINGS: Probable atelectatic changes at the left lung base with associated small left pleural effusion. Bilateral lungs are otherwise clear. Right lateral costophrenic angle is clear. Stable cardio-mediastinal silhouette. Aortic arch calcifications noted. No acute osseous abnormalities. The soft tissues are within normal limits. IMPRESSION: Probable atelectatic changes at the left lung base with associated small left pleural effusion. Electronically Signed   By: Jules Schick M.D.   On: 12/09/2022 16:40   DG Chest Port 1 View  Result Date: 11/24/2022 CLINICAL DATA:  Sepsis EXAM: PORTABLE CHEST 1 VIEW COMPARISON:  08/24/2022 x-ray and CT angiogram FINDINGS: The heart size and mediastinal contours are within normal limits. Film is rotated to the right. Calcified aorta. No consolidation, pneumothorax or effusion. No edema. Overlapping cardiac leads. The visualized skeletal structures are unremarkable. IMPRESSION: Rotated radiograph.  No acute cardiopulmonary disease. Electronically Signed   By: Karen Kays M.D.   On: 11/24/2022 16:27    Catarina Hartshorn, DO  [facility]  If 7PM-7AM, please contact night-coverage [email or web address] Password [facility] 12/10/2022, 8:58 AM   LOS: 1 day

## 2022-12-10 NOTE — Progress Notes (Signed)
Pt's heart rate went down to 36 bpm. EKG done and in chart; Atrial fibrillation with slow ventricular response. BP 118/53 (67), RR 14. HR 54. MD notified.

## 2022-12-10 NOTE — Progress Notes (Signed)
2nd attempt to give morning meds. Pt was about to swallow.

## 2022-12-10 NOTE — Evaluation (Signed)
Clinical/Bedside Swallow Evaluation Patient Details  Name: Jesse PIOTROWICZ Sr. MRN: [id number] Date of Birth: [birth year]  Today's Date: 12/10/2022 Time: SLP Start Time (ACUTE ONLY): 0820 SLP Stop Time (ACUTE ONLY): 0853 SLP Time Calculation (min) (ACUTE ONLY): 33 min  Past Medical History:  Past Medical History:  Diagnosis Date   Chronic indwelling Foley catheter    Followed by Urology   Hypertension    Kidney calculus 2014;2013   PAD (peripheral artery disease) (HCC)    S/p R foot fifth ray amputation 08/2022 Lajoyce Corners)   PAF (paroxysmal atrial fibrillation) (HCC)    On Eliquis   Pneumonia    Prostate cancer (HCC)    Shortness of breath    Past Surgical History:  Past Surgical History:  Procedure Laterality Date   ABDOMINAL AORTOGRAM W/LOWER EXTREMITY N/A 09/02/2022   Procedure: ABDOMINAL AORTOGRAM W/LOWER EXTREMITY;  Surgeon: Victorino Sparrow, MD;  Location: [facility] INVASIVE CV LAB;  Service: Vascular;  Laterality: N/A;   AMPUTATION Right 09/08/2022   Procedure: RIGHT FOOT FIFTH RAY AMPUTATION;  Surgeon: [name], MD;  Location[facility] OR;  Service: Orthopedics;  Laterality: Right;   CATARACT EXTRACTION W/PHACO Right 02/13/2013   Procedure: CATARACT EXTRACTION RIGHT EYE (WITH PHACO) AND INTRAOCULAR LENS PLACEMENT  CDE=14.53;  Surgeon: Loraine Leriche T. Nile Riggs, MD;  Location: [facility] ORS;  Service: Ophthalmology;  Laterality: Right;   CATARACT EXTRACTION W/PHACO Left 02/27/2013   Procedure: CATARACT EXTRACTION PHACO AND INTRAOCULAR LENS PLACEMENT (IOC);  Surgeon: Loraine Leriche T. Nile Riggs, MD;  Location: [facility] ORS;  Service: Ophthalmology;  Laterality: Left;  CDE:12.54   COLONOSCOPY     JOINT REPLACEMENT Right 1993;1980   Right x2   HPI:  Jesse BONDE Sr. is a 89 y.o. male with medical history significant of  paroxysmal atrial fibrillation on Eliquis, hypertension, peripheral arterial disease status post right fifth toe amputation, chronic indwelling Foley catheter, BPH, prostate cancer, sacral decubitus ulcer as  well as right heel ulcer who presents to the emergency department from [facility] via EMS due to failure to thrive.  Patient was unable to provide history, history was obtained from daughter at bedside and from ED PA.  Per report, patient was reported to have had gradual decline from baseline functioning due to poor oral intake in the last few days and increased confusion and agitation since yesterday.  He was noted with increased hand tremors. Pt bib EMS for from [facility] for failure to thrive. PT was recently discharge from [facility] after an admission for sepsis. Per EMS nursing facility states pt is not eating much. BSE requested    Assessment / Plan / Recommendation  Clinical Impression  Clinical swallowing evaluation completed while Pt was sitting upright in bed; RN and MD (present for portion of evaluation) facilitate repositioning in bed and Pt roused to appropriate alertness. Pt consumed ice chips, thin liquids, puree and regular textures without overt s/sx of oropharyngeal dysphagia. Pt does require feeder support for meals. Pt took one large pill whole with liquid and the remainder of meds crushed with applesauce; all meds taken without incident. Recommend initiate regular diet and continue with thin liquids. Please ensure Pt is alert and responsive when providing PO. Meds are ok whole with liquid. There are no further ST needs at this time, ST will sign off. Thank you, SLP Visit Diagnosis: Dysphagia, unspecified (R13.10)    Aspiration Risk  No limitations    Diet Recommendation Thin liquid;Regular    Liquid Administration via: Cup;Straw Medication Administration: Whole meds with  liquid Supervision: Staff to assist with self feeding Compensations: Slow rate;Small sips/bites Postural Changes: Seated upright at 90 degrees    Other  Recommendations Oral Care Recommendations: Oral care BID    Recommendations for follow up therapy are one component of a multi-disciplinary discharge  planning process, led by the attending physician.  Recommendations may be updated based on patient status, additional functional criteria and insurance authorization.  Follow up Recommendations No SLP follow up      Assistance Recommended at Discharge    Functional Status Assessment Patient has not had a recent decline in their functional status    Swallow Study   General Date of Onset: 12/09/22 HPI: Jesse CONBOY Sr. is a 89 y.o. male with medical history significant of  paroxysmal atrial fibrillation on Eliquis, hypertension, peripheral arterial disease status post right fifth toe amputation, chronic indwelling Foley catheter, BPH, prostate cancer, sacral decubitus ulcer as well as right heel ulcer who presents to the emergency department from [facility] via EMS due to failure to thrive.  Patient was unable to provide history, history was obtained from daughter at bedside and from ED PA.  Per report, patient was reported to have had gradual decline from baseline functioning due to poor oral intake in the last few days and increased confusion and agitation since yesterday.  He was noted with increased hand tremors. Pt bib EMS for from [facility] for failure to thrive. PT was recently discharge from [facility] after an admission for sepsis. Per EMS nursing facility states pt is not eating much. BSE requested Type of Study: Bedside Swallow Evaluation Previous Swallow Assessment: Most recent BSE 11/25/22 Diet Prior to this Study: NPO Temperature Spikes Noted: No Respiratory Status: Nasal cannula History of Recent Intubation: No Behavior/Cognition: Alert;Cooperative;Pleasant mood Oral Cavity Assessment: Within Functional Limits Oral Care Completed by SLP: Recent completion by staff Oral Cavity - Dentition: Adequate natural dentition Vision: Functional for self-feeding Self-Feeding Abilities: Able to feed self;Needs set up;Needs assist Patient Positioning: Upright in bed Baseline Vocal Quality:  Normal Volitional Cough: Strong Volitional Swallow: Able to elicit    Oral/Motor/Sensory Function Overall Oral Motor/Sensory Function: Within functional limits Facial ROM: Within Functional Limits Facial Symmetry: Within Functional Limits   Ice Chips Ice chips: Within functional limits   Thin Liquid Thin Liquid: Within functional limits    Nectar Thick Nectar Thick Liquid: Not tested   Honey Thick Honey Thick Liquid: Not tested   Puree Puree: Within functional limits   Solid    Ramiyah Mcclenahan H. Romie Levee, CCC-SLP Speech Language Pathologist  Solid: Within functional limits      Georgetta Haber 12/10/2022,9:50 AM

## 2022-12-10 NOTE — Plan of Care (Signed)

## 2022-12-10 NOTE — TOC Initial Note (Signed)
Transition of Care (TOC) - Initial/Assessment Note    Patient Details  Name: [name]. MRN: [id number] Date of Birth: [birth year]  Transition of Care [facility]) CM/SW Contact:    Villa Herb, LCSWA Phone Number: 12/10/2022, 10:34 AM  Clinical Narrative:                 Pt admitted from [facility] CSW spoke with pts daughter who states plan will be for return to facility once medically stable for D/C. CSW spoke to [place] in admissions at [facility] who states pt can return at D/C. TOC to follow.   Expected Discharge Plan: Skilled Nursing Facility Barriers to Discharge: Continued Medical Work up   Patient Goals and CMS Choice Patient states their goals for this hospitalization and ongoing recovery are:: LTC CMS Medicare.gov Compare Post Acute Care list provided to:: Patient Choice offered to / list presented to : Patient, Adult Children [place] ownership interest in [facility].provided to:: Adult Children    Expected Discharge Plan and Services In-house Referral: Clinical Social Work Discharge Planning Services: CM Consult Post Acute Care Choice: Skilled Nursing Facility Living arrangements for the past 2 months: Skilled Nursing Facility                                      Prior Living Arrangements/Services Living arrangements for the past 2 months: Skilled Nursing Facility Lives with:: Facility Resident Patient language and need for interpreter reviewed:: Yes Do you feel safe going back to the place where you live?: Yes      Need for Family Participation in Patient Care: Yes (Comment) Care giver support system in place?: Yes (comment)   Criminal Activity/Legal Involvement Pertinent to Current Situation/Hospitalization: No - Comment as needed  Activities of Daily Living   ADL Screening (condition at time of admission) Patient's cognitive ability adequate to safely complete daily activities?: No Does the patient have difficulty concentrating,  remembering, or making decisions?: Yes Patient able to express need for assistance with ADLs?: No Does the patient have difficulty dressing or bathing?: Yes Independently performs ADLs?: No Communication: Needs assistance Is this a change from baseline?: Pre-admission baseline Dressing (OT): Dependent Is this a change from baseline?: Pre-admission baseline Grooming: Dependent Is this a change from baseline?: Pre-admission baseline Feeding: Needs assistance Is this a change from baseline?: Pre-admission baseline Bathing: Dependent Is this a change from baseline?: Pre-admission baseline Toileting: Dependent Is this a change from baseline?: Pre-admission baseline In/Out Bed: Dependent Is this a change from baseline?: Pre-admission baseline  Permission Sought/Granted                  Emotional Assessment Appearance:: Appears stated age Attitude/Demeanor/Rapport: Engaged Affect (typically observed): Accepting   Alcohol / Substance Use: Not Applicable Psych Involvement: No (comment)  Admission diagnosis:  Dehydration [E86.0] Disorientation [R41.0] Acute respiratory failure with hypoxia (HCC) [J96.01] Urinary tract infection associated with indwelling urethral catheter, initial encounter (HCC) [J19.147W, N39.0] Patient Active Problem List   Diagnosis Date Noted   Elevated brain natriuretic peptide (BNP) level 12/10/2022   UTI (urinary tract infection) 12/10/2022   Hypoalbuminemia due to protein-calorie malnutrition (HCC) 12/10/2022   Failure to thrive in adult 12/10/2022   Sacral decubitus ulcer 12/10/2022   Mixed hyperlipidemia 12/10/2022   GERD (gastroesophageal reflux disease) 12/10/2022   Acute respiratory failure with hypoxia (HCC) 12/09/2022   Pressure injury of skin 11/30/2022   Protein-calorie  malnutrition, severe 11/25/2022   Sepsis (HCC) 11/24/2022   Intensive care (ICU) myopathy 10/29/2022   Amputation of fifth toe of right foot (HCC) 10/29/2022   Leg edema  09/23/2022   Hyponatremia 09/23/2022   Azotemia 09/22/2022   Prostate cancer (HCC) 09/22/2022   Chronic anticoagulation 09/17/2022   Anemia 09/16/2022   Constipation 09/16/2022   Debility 09/10/2022   PAD (peripheral artery disease) (HCC) 09/01/2022   Osteomyelitis of fifth toe of right foot (HCC) 08/31/2022   Bacteremia 08/31/2022   Acute cystitis with hematuria 08/31/2022   Polymicrobial bacterial infection 08/26/2022   Fever of undetermined origin 08/25/2022   Lactic acidosis 08/25/2022   Generalized weakness 08/25/2022   Bronchitis 08/25/2022   Pleural effusion 08/25/2022   Right foot ulcer (HCC) 08/25/2022   Essential hypertension 08/25/2022   BPH (benign prostatic hyperplasia) 08/25/2022   Atrial fibrillation (HCC) 08/25/2022   PCP:  Elfredia Nevins, MD Pharmacy:   [facility] - [place], [place] - [phone] [place] [address] Phone: [phone] Fax: [phone]  [facility] - [place], [place] - [address] [place] [address] Phone: [phone] Fax: [phone]     Social Determinants of Health (SDOH) Social History: SDOH Screenings   Food Insecurity: No Food Insecurity (12/09/2022)  Housing: Low Risk  (12/09/2022)  Transportation Needs: No Transportation Needs (12/09/2022)  Utilities: Not At Risk (12/09/2022)  Alcohol Screen: Low Risk  (07/24/2019)  Depression (PHQ2-9): Low Risk  (07/24/2019)  Financial Resource Strain: Low Risk  (07/24/2019)  Physical Activity: Inactive (07/24/2019)  Social Connections: Moderately Integrated (07/24/2019)  Stress: No Stress Concern Present (07/24/2019)  Tobacco Use: Low Risk  (11/24/2022)   SDOH Interventions:     Readmission Risk Interventions    12/10/2022   10:31 AM 11/29/2022    4:25 PM  Readmission Risk Prevention Plan  Transportation Screening Complete Complete  Medication Review (RN Care Manager)  Complete  PCP or Specialist appointment  within 3-5 days of discharge Complete Complete  HRI or Home Care Consult Complete Complete  SW Recovery Care/Counseling Consult Complete Complete  Palliative Care Screening  Complete  Skilled Nursing Facility Complete Complete

## 2022-12-10 NOTE — Progress Notes (Signed)
Unable to scan blood due to came from [facility] Product Number [id number] Blood band number [id number] , Blood Type A positive, Patient also AB pos, expiration date 12/23/2022. Verified by Darel Hong LPN and Lauren RN

## 2022-12-10 NOTE — Progress Notes (Addendum)
Progress note  RN called due to patient's hemoglobin at 6.9 this morning, H/H was repeated and it showed 6.7.  No obvious source of bleeding was noted.  Type and screen and 1 unit of PRBC was ordered to be transfused. Consider gastroenterology consult for ongoing symptoms

## 2022-12-10 NOTE — Progress Notes (Signed)
Unable to give AM meds. Pt is not alert enough to swallow PO this. MD made aware.

## 2022-12-10 NOTE — Hospital Course (Signed)
89 year old male with a history of hypertension, paroxysmal atrial fibrillation on Eliquis, peripheral arterial disease status post right fifth toe amputation, chronic indwelling Foley catheter, BPH, prostate cancer who was recently admitted hospital in May 2024 for critical limb ischemia of the right lower extremity with osteomyelitis requiring ray amputation was discharged to inpatient rehab between 09/10/2022 to 09/24/2022 and then subsequently to skilled nursing facility.   The patient has had a functional decline since the beginning of 2024.  He presents from [facility] with increasing confusion and agitation, decreased oral intake for at least 2 days. Daughter states he was confused and agitated like this during his last admission. Notably, the patient was admitted to the hospital from 11/24/2022 to 11/30/2022 with septic shock secondary to CAUTI.  The patient had Proteus bacteremia from urinary source.  In the ED, the patient was afebrile and hemodynamically stable with oxygen saturation 89% room air.  He was placed on 2 L. BMP showed sodium 137, potassium 3.5, bicarbonate 26, serum creatinine 0.80.  WBC 7.1, hemoglobin 8.3, platelets 1-52,000.  Lactic acid peaked at 2.6.  UA showed >50 WBC, CT of the brain was negative.  Chest x-ray showed left basilar atelectasis.  Blood cultures were obtained.  The patient was started on vancomycin and ceftriaxone.

## 2022-12-10 NOTE — Plan of Care (Signed)

## 2022-12-10 NOTE — Progress Notes (Signed)
   12/10/22 1110  Spiritual Encounters  Type of Visit Initial  Care provided to: Family;Pt not available  Referral source Clinical staff  Reason for visit Advance directives  OnCall Visit No   Chaplain went to visit Pt and provide education on Advanced Care Directives for Pt. Pt's daughter, who was in the room with Pt, asked Chaplain to receive the information. Pt listened education, as well. Chaplain was able to provide education and solved questions from Pt's daughter regarding filling out the Advanced Care Directives form. Pt's daughter was informed that if she had any questions about the form, she can contact anyone in the Chaplain's office. Chaplain also asked Pot's daughter to contact the Chaplain's office once she has filled out the form, so Chaplain can bring a Conservation officer, nature and witnesses to finalize the form. Chaplain left the room and had a brief conversation with Pt and daughter. Both were grateful for the visit. Chaplain exited the room.

## 2022-12-10 NOTE — Progress Notes (Signed)
Pharmacy Antibiotic Note  Jesse SCHWEDER Sr. is a 89 y.o. male admitted on 12/09/2022 with  wound infection .  Pharmacy has been consulted for Vancomycin dosing. WBC WNL. Renal function good.   Plan: Vancomycin 1000 mg IV q24h >>>Estimated AUC: 484 Ceftriaxone per MD Trend WBC, temp, renal function  F/U infectious work-up Drug levels as indicated   Height: 5\' 7"  (170.2 cm) Weight: 88 kg (194 lb 0.1 oz) IBW/kg (Calculated) : 66.1  Temp (24hrs), Avg:98.2 F (36.8 C), Min:97.7 F (36.5 C), Max:98.8 F (37.1 C)  Recent Labs  Lab 12/09/22 1513 12/09/22 1536 12/09/22 1741 12/10/22 0052  WBC  --  7.1  --   --   CREATININE  --  0.86  --   --   LATICACIDVEN 2.6*  --  2.2* 1.4    Estimated Creatinine Clearance: 58.1 mL/min (by C-G formula based on SCr of 0.86 mg/dL).    Allergies  Allergen Reactions   Codeine Swelling   Abran Duke, PharmD, BCPS Clinical Pharmacist Phone: [phone]

## 2022-12-10 NOTE — Progress Notes (Signed)
CRITICAL VALUE STICKER  CRITICAL VALUE: Hemoglobin 6.9  RECEIVER (on-site recipient of call): Lockie Mola, RN  MD NOTIFIED: Dr. Thomes Dinning, MD  RESPONSE:  awaiting orders

## 2022-12-11 ENCOUNTER — Inpatient Hospital Stay (HOSPITAL_COMMUNITY): Payer: Medicare Other

## 2022-12-11 DIAGNOSIS — G9341 Metabolic encephalopathy: Secondary | ICD-10-CM | POA: Diagnosis not present

## 2022-12-11 DIAGNOSIS — R627 Adult failure to thrive: Secondary | ICD-10-CM | POA: Diagnosis not present

## 2022-12-11 DIAGNOSIS — I5031 Acute diastolic (congestive) heart failure: Secondary | ICD-10-CM | POA: Diagnosis not present

## 2022-12-11 DIAGNOSIS — N3001 Acute cystitis with hematuria: Secondary | ICD-10-CM

## 2022-12-11 LAB — BPAM RBC
Blood Product Expiration Date: 202409152359 *Deleted
Unit Type and Rh: 6200 *Deleted

## 2022-12-11 LAB — URINE CULTURE: Culture: NO GROWTH

## 2022-12-11 LAB — BASIC METABOLIC PANEL
Anion gap: 6 (ref 5–15)
BUN: 26 mg/dL — ABNORMAL HIGH (ref 8–23)
CO2: 28 mmol/L (ref 22–32)
Calcium: 8.2 mg/dL — ABNORMAL LOW (ref 8.9–10.3)
Chloride: 102 mmol/L (ref 98–111)
Creatinine, Ser: 0.84 mg/dL (ref 0.61–1.24)
GFR, Estimated: >60 mL/min (ref >60)
Glucose, Bld: 80 mg/dL (ref 70–99)
Potassium: 3.2 mmol/L — ABNORMAL LOW (ref 3.5–5.1)
Sodium: 136 mmol/L (ref 135–145)

## 2022-12-11 LAB — CBC
HCT: 26.5 % — ABNORMAL LOW (ref 39.0–52.0)
Hemoglobin: 8.4 g/dL — ABNORMAL LOW (ref 13.0–17.0)
MCH: 31.9 pg (ref 26.0–34.0)
MCHC: 31.7 g/dL (ref 30.0–36.0)
MCV: 100.8 fL — ABNORMAL HIGH (ref 80.0–100.0)
Platelets: 375 10*3/uL (ref 150–400)
RBC: 2.63 MIL/uL — ABNORMAL LOW (ref 4.22–5.81)
RDW: 22.2 % — ABNORMAL HIGH (ref 11.5–15.5)
WBC: 8.1 10*3/uL (ref 4.0–10.5)
nRBC: 0 % (ref 0.0–0.2)

## 2022-12-11 LAB — ECHOCARDIOGRAM LIMITED
Height: 67 in
S' Lateral: 2.8 cm
Weight: 3168 [oz_av]

## 2022-12-11 LAB — GLUCOSE, CAPILLARY
Glucose-Capillary: 78 mg/dL (ref 70–99)
Glucose-Capillary: 99 mg/dL (ref 70–99)

## 2022-12-11 LAB — MAGNESIUM: Magnesium: 1.9 mg/dL (ref 1.7–2.4)

## 2022-12-11 MED ORDER — POTASSIUM CHLORIDE CRYS ER 20 MEQ PO TBCR
40.0000 meq | EXTENDED_RELEASE_TABLET | Freq: Once | ORAL | Status: AC
  Administered 2022-12-11: 40 meq via ORAL
  Filled 2022-12-11: qty 2

## 2022-12-11 NOTE — Progress Notes (Signed)
*  PRELIMINARY RESULTS* Echocardiogram 2D Echocardiogram has not been performed. Patient on bedside commode at present.  Stacey Drain 12/11/2022, 12:45 PM

## 2022-12-11 NOTE — Progress Notes (Signed)
*  PRELIMINARY RESULTS* Echocardiogram Limited 2-D Echocardiogram  has been performed.  Stacey Drain 12/11/2022, 11:56 AM

## 2022-12-11 NOTE — Progress Notes (Signed)
Patient more alert today than yesterday when I assisted in his care with primary nurse. Was able to carry on a full lucid conversation, and respond appropriately. Patient ate a small amount of his breakfast, mostly oatmeal, but did drink all of his juice and all of his milk. Took meds today without any difficulty, some were unable to be crushed so they were held. Patient currently receiving a wound care change per order by student nurses and their instructor. Edema noted to have increased slightly to arms and legs.

## 2022-12-11 NOTE — Plan of Care (Signed)

## 2022-12-11 NOTE — Progress Notes (Deleted)
At this time, patient not alert enough to administer medications safely.

## 2022-12-11 NOTE — Progress Notes (Signed)
PROGRESS NOTE  [name] NWG:[id number] DOB: [birth year] DOA: 12/09/2022 PCP: Elfredia Nevins, MD  Brief History:  89 year old male with a history of hypertension, paroxysmal atrial fibrillation on Eliquis, peripheral arterial disease status post right fifth toe amputation, chronic indwelling Foley catheter, BPH, prostate cancer who was recently admitted hospital in May 2024 for critical limb ischemia of the right lower extremity with osteomyelitis requiring ray amputation was discharged to inpatient rehab between 09/10/2022 to 09/24/2022 and then subsequently to skilled nursing facility.   The patient has had a functional decline since the beginning of 2024.  He presents from [facility] with increasing confusion and agitation, decreased oral intake for at least 2 days. Daughter states he was confused and agitated like this during his last admission. Notably, the patient was admitted to the hospital from 11/24/2022 to 11/30/2022 with septic shock secondary to CAUTI.  The patient had Proteus bacteremia from urinary source.  In the ED, the patient was afebrile and hemodynamically stable with oxygen saturation 89% room air.  He was placed on 2 L. BMP showed sodium 137, potassium 3.5, bicarbonate 26, serum creatinine 0.80.  WBC 7.1, hemoglobin 8.3, platelets 1-52,000.  Lactic acid peaked at 2.6.  UA showed >50 WBC, CT of the brain was negative.  Chest x-ray showed left basilar atelectasis.  Blood cultures were obtained.  The patient was started on vancomycin and ceftriaxone.   Assessment/Plan: Acute metabolic encephalopathy -Multifactorial including UTI, dehydration -Serum B12 1188 -Folic acid 8.0 -12/11/22-improved, back to baseline with IVF and IV antibiotics   CAUTI -Patient has chronic indwelling Foley catheter -UA> 50 WBC -continue empiric vanc/ceftriaxone pending cultures   Lactic acidosis -Presented with serum lactate 2.6>> 1.4 -Continue IV fluids   Stage IV sacral  decubitus -Present on admission -Wound care consult appreciated>>follow recommendations -Does not appear infected on exam   Left heel decubitus -Wound care consult appreciated>>follow recommendations -He has eschar type lesion--clinically not infected   Paroxysmal atrial fibrillation -Followed as outpatient by Dr. Rosemary Holms, last seen 7/22. -CHA2DS2-VASc Score 5.  -Continue telemetry.  -08/26/2022 echo EF 65 to 70%, no WMA, normal RVF, mild MR -continue apixaban   PAD/Critical limb ischemia of RLE requiring fifth partial ray amputation History of osteomyelitis -Patient had underwent abdominal aortogram with runoff 5/16 with [facility] Karin Lieu) with recanalization and ballon angioplasty of tibioperoneal trunk. -Ultimately required R fifth partial ray amputation 5/22 with Ortho Lajoyce Corners).   Prostate CA Follows up with Dr. Retta Diones urology as outpatient.  Has progressive prostate cancer.  Bone imaging was  negative on 2023.  -Currently has chronic Foley catheter.   Acute on chronic anemia -no signs of active blood loss -likely dilution -transfused one unit PRBC -continue apixaban for now   Physical deconditioning -Discharged to inpatient rehab (5/24 - 6/7) then to SNF.  -General functional decline since the beginning of 2024.            Family Communication:  daughter updated 12/11/22   Consultants:  palliative   Code Status:  FULL   DVT Prophylaxis:  apixaban     Procedures: As Listed in Progress Note Above   Antibiotics: Vanc 8/22>> Ceftriaxone 8/22>>>                Subjective: Patient denies fevers, chills, headache, chest pain, dyspnea, nausea, vomiting, diarrhea, abdominal pain, dysuria, hematuria, hematochezia, and melena.   Objective: Vitals:   12/10/22 2338 12/11/22 0500 12/11/22 0639 12/11/22 1429  BP: Marland Kitchen)  118/53  (!) 110/48 137/67  Pulse: (!) 54  74 60  Resp: 14 18  18   Temp: 97.7 F (36.5 C) (!) 97.5 F (36.4 C)  97.8 F (36.6 C)  TempSrc:  Axillary Axillary  Oral  SpO2: 98% 100%  91%  Weight:  89.8 kg    Height:        Intake/Output Summary (Last 24 hours) at 12/11/2022 1757 Last data filed at 12/11/2022 1600 Gross per 24 hour  Intake 580 ml  Output 200 ml  Net 380 ml   Weight change: 14.8 kg Exam:  General:  Pt is alert, follows commands appropriately, not in acute distress HEENT: No icterus, No thrush, No neck mass, [place]/AT Cardiovascular: RRR, S1/S2, no rubs, no gallops Respiratory: bibasilar rales.  No wheeze Abdomen: Soft/+BS, non tender, non distended, no guarding Extremities: No edema, No lymphangitis, No petechiae, No rashes, no synovitis   Data Reviewed: I have personally reviewed following labs and imaging studies Basic Metabolic Panel: Recent Labs  Lab 12/09/22 1536 12/10/22 0052 12/11/22 0515  NA 136 137 136  K 3.9 3.5 3.2*  CL 98 101 102  CO2 25 26 28   GLUCOSE 108* 82 80  BUN 33* 31* 26*  CREATININE 0.86 0.80 0.84  CALCIUM 8.2* 8.1* 8.2*  MG  --  1.8 1.9  PHOS  --  2.7  --    Liver Function Tests: Recent Labs  Lab 12/09/22 1536 12/10/22 0052  AST 35 30  ALT 30 26  ALKPHOS 104 93  BILITOT 0.9 1.1  PROT 6.3* 5.4*  ALBUMIN 2.4* 2.1*   No results for input(s): "LIPASE", "AMYLASE" in the last 168 hours. No results for input(s): "AMMONIA" in the last 168 hours. Coagulation Profile: No results for input(s): "INR", "PROTIME" in the last 168 hours. CBC: Recent Labs  Lab 12/09/22 1536 12/10/22 0052 12/10/22 0447 12/11/22 0515  WBC 7.1 7.8  --  8.1  NEUTROABS 4.9  --   --   --   HGB 8.3* 6.9* 6.7* 8.4*  HCT 26.6* 22.5* 21.8* 26.5*  MCV 103.1* 103.7*  --  100.8*  PLT 458* 392  --  375   Cardiac Enzymes: No results for input(s): "CKTOTAL", "CKMB", "CKMBINDEX", "TROPONINI" in the last 168 hours. BNP: Invalid input(s): "POCBNP" CBG: Recent Labs  Lab 12/10/22 0609 12/10/22 1117 12/10/22 1719 12/10/22 2327 12/11/22 0621  GLUCAP 75 85 80 92 78   HbA1C: No results for  input(s): "HGBA1C" in the last 72 hours. Urine analysis:    Component Value Date/Time   COLORURINE YELLOW 12/09/2022 1641   APPEARANCEUR CLOUDY (A) 12/09/2022 1641   APPEARANCEUR Clear 07/06/2022 1303   LABSPEC 1.015 12/09/2022 1641   PHURINE 5.0 12/09/2022 1641   GLUCOSEU NEGATIVE 12/09/2022 1641   HGBUR SMALL (A) 12/09/2022 1641   BILIRUBINUR NEGATIVE 12/09/2022 1641   BILIRUBINUR Negative 07/06/2022 1303   KETONESUR NEGATIVE 12/09/2022 1641   PROTEINUR 100 (A) 12/09/2022 1641   UROBILINOGEN 0.2 01/19/2010 1950   NITRITE NEGATIVE 12/09/2022 1641   LEUKOCYTESUR LARGE (A) 12/09/2022 1641   Sepsis Labs: @LABRCNTIP (procalcitonin:4,lacticidven:4) ) Recent Results (from the past 240 hour(s))  Blood culture (routine x 2)     Status: None (Preliminary result)   Collection Time: 12/09/22  3:13 PM   Specimen: BLOOD  Result Value Ref Range Status   Specimen Description BLOOD RFOA  Final   Special Requests   Final    BOTTLES DRAWN AEROBIC AND ANAEROBIC Blood Culture adequate volume   Culture  Final    NO GROWTH 2 DAYS Performed at [facility], [address] [place], [place] [address]    Report Status PENDING  Incomplete  Blood culture (routine x 2)     Status: None (Preliminary result)   Collection Time: 12/09/22  3:13 PM   Specimen: BLOOD  Result Value Ref Range Status   Specimen Description BLOOD RW  Final   Special Requests   Final    BOTTLES DRAWN AEROBIC AND ANAEROBIC Blood Culture adequate volume   Culture   Final    NO GROWTH 2 DAYS Performed at [facility], [address], [place], [place] [address]    Report Status PENDING  Incomplete  Resp panel by RT-PCR (RSV, Flu A&B, Covid) Anterior Nasal Swab     Status: None   Collection Time: 12/09/22  4:41 PM   Specimen: Anterior Nasal Swab  Result Value Ref Range Status   SARS Coronavirus 2 by RT PCR NEGATIVE NEGATIVE Final    Comment: (NOTE) SARS-CoV-2 target nucleic acids are NOT DETECTED.  The SARS-CoV-2 RNA is  generally detectable in upper respiratory specimens during the acute phase of infection. The lowest concentration of SARS-CoV-2 viral copies this assay can detect is 138 copies/mL. A negative result does not preclude SARS-Cov-2 infection and should not be used as the sole basis for treatment or other patient management decisions. A negative result may occur with  improper specimen collection/handling, submission of specimen other than nasopharyngeal swab, presence of viral mutation(s) within the areas targeted by this assay, and inadequate number of viral copies(<138 copies/mL). A negative result must be combined with clinical observations, patient history, and epidemiological information. The expected result is Negative.  Fact Sheet for Patients:  BloggerCourse.com  Fact Sheet for Healthcare Providers:  SeriousBroker.it  This test is no t yet approved or cleared by the [place] FDA and  has been authorized for detection and/or diagnosis of SARS-CoV-2 by FDA under an Emergency Use Authorization (EUA). This EUA will remain  in effect (meaning this test can be used) for the duration of the COVID-19 declaration under Section 564(b)(1) of the Act, 21 U.S.C.section 360bbb-3(b)(1), unless the authorization is terminated  or revoked sooner.       Influenza A by PCR NEGATIVE NEGATIVE Final   Influenza B by PCR NEGATIVE NEGATIVE Final    Comment: (NOTE) The Xpert Xpress SARS-CoV-2/FLU/RSV plus assay is intended as an aid in the diagnosis of influenza from Nasopharyngeal swab specimens and should not be used as a sole basis for treatment. Nasal washings and aspirates are unacceptable for Xpert Xpress SARS-CoV-2/FLU/RSV testing.  Fact Sheet for Patients: BloggerCourse.com  Fact Sheet for Healthcare Providers: SeriousBroker.it  This test is not yet approved or cleared by the [place] FDA and has been authorized for detection and/or diagnosis of SARS-CoV-2 by FDA under an Emergency Use Authorization (EUA). This EUA will remain in effect (meaning this test can be used) for the duration of the COVID-19 declaration under Section 564(b)(1) of the Act, 21 U.S.C. section 360bbb-3(b)(1), unless the authorization is terminated or revoked.     Resp Syncytial Virus by PCR NEGATIVE NEGATIVE Final    Comment: (NOTE) Fact Sheet for Patients: BloggerCourse.com  Fact Sheet for Healthcare Providers: SeriousBroker.it  This test is not yet approved or cleared by the [place] FDA and has been authorized for detection and/or diagnosis of SARS-CoV-2 by FDA under an Emergency Use Authorization (EUA). This EUA will remain in effect (meaning this test can be used)  for the duration of the COVID-19 declaration under Section 564(b)(1) of the Act, 21 U.S.C. section 360bbb-3(b)(1), unless the authorization is terminated or revoked.  Performed at [facility], [address] [place], [place] [address]   Culture, Urine (Do not remove urinary catheter, catheter placed by urology or difficult to place)     Status: None   Collection Time: 12/10/22 12:02 PM   Specimen: Urine, Catheterized  Result Value Ref Range Status   Specimen Description   Final    URINE, CATHETERIZED Performed at [facility], [address] [place], [place] [address]    Special Requests   Final    NONE Performed at [facility], [address] [place], [place] [address]    Culture   Final    NO GROWTH Performed at [facility], [address], [place], [place] [address]    Report Status 12/11/2022 FINAL  Final     Scheduled Meds:  apixaban  5 mg Oral BID   atorvastatin  40 mg Oral Daily   Chlorhexidine Gluconate Cloth  6 each Topical Q0600   leptospermum manuka honey  1 Application Topical Daily   omega-3 acid ethyl esters  1 g Oral Daily    pantoprazole  40 mg Oral BID   Continuous Infusions:  cefTRIAXone (ROCEPHIN)  IV 1 g (12/11/22 1646)   vancomycin 1,000 mg (12/10/22 2152)    Procedures/Studies: ECHOCARDIOGRAM LIMITED  Result Date: 12/11/2022    ECHOCARDIOGRAM LIMITED REPORT   Patient Name:   AYOMIDE BAILIFF Sr. Date of Exam: 12/11/2022 Medical Rec #:  [id number]        Height:       67.0 in Accession #:    [id number]       Weight:       198.0 lb Date of Birth:  02/20/1929        BSA:          2.014 m Patient Age:    89 years         BP:           121/52 mmHg Patient Gender: M                HR:           53 bpm. [facility] Procedure: Limited Echo Indications:    CHF-Acute Diastolic I50.31  History:        Patient has prior history of Echocardiogram examinations, most                 recent 08/26/2022. Arrythmias:Atrial Fibrillation; Risk                 Factors:Hypertension, Dyslipidemia and Non-Smoker.  Sonographer:    Celesta Gentile RCS Referring Phys: [id number] OLADAPO ADEFESO IMPRESSIONS  1. Left ventricular ejection fraction, by estimation, is 60 to 65%. The left ventricle has normal function. There is mild left ventricular hypertrophy.  2. Right ventricular systolic function is normal. The right ventricular size is normal. Tricuspid regurgitation signal is inadequate for assessing PA pressure.  3. The inferior vena cava is normal in size with <50% respiratory variability, suggesting right atrial pressure of 8 mmHg. FINDINGS  Left Ventricle: Left ventricular ejection fraction, by estimation, is 60 to 65%. The left ventricle has normal function. The left ventricular internal cavity size was normal in size. There is mild left ventricular hypertrophy. Right Ventricle: The right ventricular size is normal. No increase in right ventricular wall thickness. Right  ventricular systolic function is normal. Tricuspid regurgitation signal is inadequate for assessing PA pressure. Pericardium: There is no evidence of pericardial effusion.  Aorta: The aortic root is normal in size and structure. Venous: The inferior vena cava is normal in size with less than 50% respiratory variability, suggesting right atrial pressure of 8 mmHg. LEFT VENTRICLE PLAX 2D LVIDd:         4.20 cm LVIDs:         2.80 cm LV PW:         1.30 cm LV IVS:        1.10 cm LVOT diam:     1.90 cm LVOT Area:     2.84 cm  RIGHT VENTRICLE TAPSE (M-mode): 1.6 cm LEFT ATRIUM         Index LA diam:    3.60 cm 1.79 cm/m   AORTA Ao Root diam: 3.20 cm  SHUNTS Systemic Diam: 1.90 cm Epifanio Lesches MD Electronically signed by Epifanio Lesches MD Signature Date/Time: 12/11/2022/1:29:22 PM    Final    MR Lumbar Spine W Wo Contrast  Result Date: 12/10/2022 CLINICAL DATA:  Lumbar osteomyelitis. History of prostate cancer. Recent bacteremia. EXAM: MRI LUMBAR SPINE WITHOUT AND WITH CONTRAST TECHNIQUE: Multiplanar and multiecho pulse sequences of the lumbar spine were obtained without and with intravenous contrast. CONTRAST:  8mL GADAVIST GADOBUTROL 1 MMOL/ML IV SOLN COMPARISON:  CT abdomen and pelvis 08/24/2022 FINDINGS: Multiple sequences are up to moderately motion degraded. Segmentation: Standard. Alignment: Mildly exaggerated lumbar lordosis. Grade 1 anterolisthesis of L4 on L5. Vertebrae: No fracture or suspicious marrow lesion. Multilevel degenerative endplate changes and Schmorl's nodes. No findings strongly suggestive of discitis-osteomyelitis in the lumbar spine within limitations of motion artifact on STIR and postcontrast sequences. Prominent marrow T1 hyperintensity, particularly in the included pelvis, query prior radiation therapy. Conus medullaris and cauda equina: Conus extends to the L1 level. Conus and cauda equina appear normal. Paraspinal and other soft tissues: Bilateral renal cysts for which no follow-up imaging is recommended. Partially visualized sacral decubitus ulcer without gross evidence osteomyelitis within the included portion of the sacrum, however this  was not evaluated primarily on this lumbar spine MRI and the entire ulcer and sacrum were not included on this study. Partially visualized nonspecific presacral edema. Diffuse fatty atrophy of the paraspinal musculature with nonspecific edema in the posterior paraspinal soft tissues. No evidence of lumbar paraspinal abscess. Chronic 3.2 x 1.8 cm cystic focus at the posterosuperior aspect of the left gluteus medius muscle, stable to mildly smaller than on the prior CT. Disc levels: Disc desiccation throughout the lumbar spine. Severe disc space narrowing at L4-5 and mild narrowing at L1-2 and L3-4. T12-L1 minimal disc bulging and mild facet hypertrophy without stenosis. L1-2: Disc bulging and mild-to-moderate facet and ligamentum flavum hypertrophy without significant stenosis. L2-3: Disc bulging and moderate facet and ligamentum flavum hypertrophy result in mild spinal stenosis, mild bilateral lateral recess stenosis, and moderate left greater than right neural foraminal stenosis. L3-4: Disc bulging and mild-to-moderate facet and ligamentum flavum hypertrophy result in mild spinal stenosis, mild bilateral lateral recess stenosis, and mild-to-moderate bilateral neural foraminal stenosis. L4-5: Anterolisthesis with bulging uncovered disc, endplate spurring, and severe facet and ligamentum flavum hypertrophy result in severe spinal stenosis and moderate bilateral neural foraminal stenosis. L5-S1: Disc bulging and severe facet hypertrophy result in mild bilateral lateral recess stenosis and mild bilateral neural foraminal stenosis without significant spinal stenosis. IMPRESSION: 1. Motion degraded examination without evidence of lumbar discitis-osteomyelitis or paraspinal abscess.  2. Partially visualized sacral decubitus ulcer. A pelvic MRI would be needed to fully evaluate this region including any potential for sacral osteomyelitis if clinically warranted. 3. Diffuse lumbar disc and facet degeneration, most notable at  L4-5 where there is severe spinal stenosis and moderate bilateral neural foraminal stenosis. Electronically Signed   By: Sebastian Ache M.D.   On: 12/10/2022 17:37   CT Head Wo Contrast  Result Date: 12/09/2022 CLINICAL DATA:  Mental status change, persistent or worsening EXAM: CT HEAD WITHOUT CONTRAST TECHNIQUE: Contiguous axial images were obtained from the base of the skull through the vertex without intravenous contrast. RADIATION DOSE REDUCTION: This exam was performed according to the departmental dose-optimization program which includes automated exposure control, adjustment of the mA and/or kV according to patient size and/or use of iterative reconstruction technique. COMPARISON:  12/09/2010 FINDINGS: Brain: There is atrophy and chronic small vessel disease changes. No acute intracranial abnormality. Specifically, no hemorrhage, hydrocephalus, mass lesion, acute infarction, or significant intracranial injury. Vascular: No hyperdense vessel or unexpected calcification. Skull: No acute calvarial abnormality. Sinuses/Orbits: No acute findings Other: None IMPRESSION: Atrophy, chronic microvascular disease. No acute intracranial abnormality. Electronically Signed   By: Charlett Nose M.D.   On: 12/09/2022 20:20   DG Chest Port 1 View  Result Date: 12/09/2022 CLINICAL DATA:  hypoxia EXAM: PORTABLE CHEST 1 VIEW COMPARISON:  11/24/2022. FINDINGS: Probable atelectatic changes at the left lung base with associated small left pleural effusion. Bilateral lungs are otherwise clear. Right lateral costophrenic angle is clear. Stable cardio-mediastinal silhouette. Aortic arch calcifications noted. No acute osseous abnormalities. The soft tissues are within normal limits. IMPRESSION: Probable atelectatic changes at the left lung base with associated small left pleural effusion. Electronically Signed   By: Jules Schick M.D.   On: 12/09/2022 16:40   DG Chest Port 1 View  Result Date: 11/24/2022 CLINICAL DATA:  Sepsis  EXAM: PORTABLE CHEST 1 VIEW COMPARISON:  08/24/2022 x-ray and CT angiogram FINDINGS: The heart size and mediastinal contours are within normal limits. Film is rotated to the right. Calcified aorta. No consolidation, pneumothorax or effusion. No edema. Overlapping cardiac leads. The visualized skeletal structures are unremarkable. IMPRESSION: Rotated radiograph.  No acute cardiopulmonary disease. Electronically Signed   By: Karen Kays M.D.   On: 11/24/2022 16:27    Catarina Hartshorn, DO  [facility]  If 7PM-7AM, please contact night-coverage [email or web address] Password [facility] 12/11/2022, 5:57 PM   LOS: 2 days

## 2022-12-12 DIAGNOSIS — M6281 Muscle weakness (generalized): Secondary | ICD-10-CM | POA: Diagnosis present

## 2022-12-12 DIAGNOSIS — L89610 Pressure ulcer of right heel, unstageable: Secondary | ICD-10-CM | POA: Diagnosis not present

## 2022-12-12 DIAGNOSIS — J9601 Acute respiratory failure with hypoxia: Secondary | ICD-10-CM | POA: Diagnosis not present

## 2022-12-12 DIAGNOSIS — I4891 Unspecified atrial fibrillation: Secondary | ICD-10-CM | POA: Diagnosis not present

## 2022-12-12 DIAGNOSIS — N39498 Other specified urinary incontinence: Secondary | ICD-10-CM | POA: Diagnosis not present

## 2022-12-12 DIAGNOSIS — R4182 Altered mental status, unspecified: Secondary | ICD-10-CM | POA: Diagnosis present

## 2022-12-12 DIAGNOSIS — I10 Essential (primary) hypertension: Secondary | ICD-10-CM | POA: Diagnosis not present

## 2022-12-12 DIAGNOSIS — L89154 Pressure ulcer of sacral region, stage 4: Secondary | ICD-10-CM | POA: Diagnosis not present

## 2022-12-12 DIAGNOSIS — Z7901 Long term (current) use of anticoagulants: Secondary | ICD-10-CM | POA: Diagnosis not present

## 2022-12-12 DIAGNOSIS — Z4781 Encounter for orthopedic aftercare following surgical amputation: Secondary | ICD-10-CM | POA: Diagnosis not present

## 2022-12-12 DIAGNOSIS — N138 Other obstructive and reflux uropathy: Secondary | ICD-10-CM | POA: Diagnosis not present

## 2022-12-12 DIAGNOSIS — Z89421 Acquired absence of other right toe(s): Secondary | ICD-10-CM | POA: Diagnosis not present

## 2022-12-12 DIAGNOSIS — L8990 Pressure ulcer of unspecified site, unspecified stage: Secondary | ICD-10-CM | POA: Diagnosis not present

## 2022-12-12 DIAGNOSIS — N390 Urinary tract infection, site not specified: Secondary | ICD-10-CM | POA: Diagnosis present

## 2022-12-12 DIAGNOSIS — I959 Hypotension, unspecified: Secondary | ICD-10-CM | POA: Diagnosis not present

## 2022-12-12 DIAGNOSIS — R159 Full incontinence of feces: Secondary | ICD-10-CM | POA: Diagnosis not present

## 2022-12-12 DIAGNOSIS — R609 Edema, unspecified: Secondary | ICD-10-CM | POA: Diagnosis not present

## 2022-12-12 DIAGNOSIS — N3000 Acute cystitis without hematuria: Secondary | ICD-10-CM | POA: Diagnosis not present

## 2022-12-12 DIAGNOSIS — R7989 Other specified abnormal findings of blood chemistry: Secondary | ICD-10-CM | POA: Diagnosis not present

## 2022-12-12 DIAGNOSIS — Z7401 Bed confinement status: Secondary | ICD-10-CM | POA: Diagnosis not present

## 2022-12-12 DIAGNOSIS — K219 Gastro-esophageal reflux disease without esophagitis: Secondary | ICD-10-CM | POA: Diagnosis not present

## 2022-12-12 DIAGNOSIS — K5900 Constipation, unspecified: Secondary | ICD-10-CM | POA: Diagnosis not present

## 2022-12-12 DIAGNOSIS — G9341 Metabolic encephalopathy: Secondary | ICD-10-CM | POA: Diagnosis not present

## 2022-12-12 DIAGNOSIS — R2681 Unsteadiness on feet: Secondary | ICD-10-CM | POA: Diagnosis not present

## 2022-12-12 DIAGNOSIS — D649 Anemia, unspecified: Secondary | ICD-10-CM | POA: Diagnosis not present

## 2022-12-12 DIAGNOSIS — L89620 Pressure ulcer of left heel, unstageable: Secondary | ICD-10-CM | POA: Diagnosis not present

## 2022-12-12 DIAGNOSIS — R41841 Cognitive communication deficit: Secondary | ICD-10-CM | POA: Diagnosis not present

## 2022-12-12 DIAGNOSIS — J40 Bronchitis, not specified as acute or chronic: Secondary | ICD-10-CM | POA: Diagnosis not present

## 2022-12-12 DIAGNOSIS — R5381 Other malaise: Secondary | ICD-10-CM | POA: Diagnosis not present

## 2022-12-12 DIAGNOSIS — A419 Sepsis, unspecified organism: Secondary | ICD-10-CM | POA: Diagnosis not present

## 2022-12-12 DIAGNOSIS — I739 Peripheral vascular disease, unspecified: Secondary | ICD-10-CM | POA: Diagnosis not present

## 2022-12-12 DIAGNOSIS — R55 Syncope and collapse: Secondary | ICD-10-CM | POA: Diagnosis not present

## 2022-12-12 DIAGNOSIS — K922 Gastrointestinal hemorrhage, unspecified: Secondary | ICD-10-CM | POA: Diagnosis not present

## 2022-12-12 DIAGNOSIS — R0902 Hypoxemia: Secondary | ICD-10-CM | POA: Diagnosis not present

## 2022-12-12 DIAGNOSIS — F0390 Unspecified dementia without behavioral disturbance: Secondary | ICD-10-CM | POA: Diagnosis not present

## 2022-12-12 DIAGNOSIS — R600 Localized edema: Secondary | ICD-10-CM | POA: Diagnosis not present

## 2022-12-12 DIAGNOSIS — N400 Enlarged prostate without lower urinary tract symptoms: Secondary | ICD-10-CM | POA: Diagnosis not present

## 2022-12-12 DIAGNOSIS — C61 Malignant neoplasm of prostate: Secondary | ICD-10-CM | POA: Diagnosis not present

## 2022-12-12 DIAGNOSIS — R1311 Dysphagia, oral phase: Secondary | ICD-10-CM | POA: Diagnosis not present

## 2022-12-12 DIAGNOSIS — M869 Osteomyelitis, unspecified: Secondary | ICD-10-CM | POA: Diagnosis not present

## 2022-12-12 DIAGNOSIS — R627 Adult failure to thrive: Secondary | ICD-10-CM | POA: Diagnosis not present

## 2022-12-12 DIAGNOSIS — I480 Paroxysmal atrial fibrillation: Secondary | ICD-10-CM | POA: Diagnosis not present

## 2022-12-12 LAB — BASIC METABOLIC PANEL
Anion gap: 6 (ref 5–15)
BUN: 26 mg/dL — ABNORMAL HIGH (ref 8–23)
CO2: 27 mmol/L (ref 22–32)
Calcium: 8.2 mg/dL — ABNORMAL LOW (ref 8.9–10.3)
Chloride: 100 mmol/L (ref 98–111)
Creatinine, Ser: 0.9 mg/dL (ref 0.61–1.24)
GFR, Estimated: >60 mL/min (ref >60)
Glucose, Bld: 101 mg/dL — ABNORMAL HIGH (ref 70–99)
Potassium: 3.4 mmol/L — ABNORMAL LOW (ref 3.5–5.1)
Sodium: 133 mmol/L — ABNORMAL LOW (ref 135–145)

## 2022-12-12 LAB — GLUCOSE, CAPILLARY
Glucose-Capillary: 127 mg/dL — ABNORMAL HIGH (ref 70–99)
Glucose-Capillary: 85 mg/dL (ref 70–99)
Glucose-Capillary: 90 mg/dL (ref 70–99)

## 2022-12-12 LAB — MAGNESIUM: Magnesium: 1.7 mg/dL (ref 1.7–2.4)

## 2022-12-12 MED ORDER — CEFADROXIL 500 MG PO CAPS: 500.0000 mg | ORAL_CAPSULE | Freq: Two times a day (BID) | ORAL | Status: AC

## 2022-12-12 MED ORDER — POTASSIUM CHLORIDE CRYS ER 20 MEQ PO TBCR
20.0000 meq | EXTENDED_RELEASE_TABLET | Freq: Once | ORAL | Status: AC
  Administered 2022-12-12: 20 meq via ORAL
  Filled 2022-12-12: qty 1

## 2022-12-12 MED ORDER — MAGNESIUM OXIDE -MG SUPPLEMENT 400 (240 MG) MG PO TABS
400.0000 mg | ORAL_TABLET | Freq: Once | ORAL | Status: AC
  Administered 2022-12-12: 400 mg via ORAL
  Filled 2022-12-12: qty 1

## 2022-12-12 MED ORDER — CEFADROXIL 500 MG PO CAPS
500.0000 mg | ORAL_CAPSULE | Freq: Two times a day (BID) | ORAL | Status: DC
  Filled 2022-12-12 (×2): qty 1

## 2022-12-12 MED ORDER — MEDIHONEY WOUND/BURN DRESSING EX PSTE: 1.0000 | PASTE | Freq: Every day | CUTANEOUS | Status: AC

## 2022-12-12 NOTE — Progress Notes (Signed)
Report called to Amy at [facility].

## 2022-12-12 NOTE — TOC Transition Note (Signed)
Transition of Care [facility]) - CM/SW Discharge Note   Patient Details  Name: Jesse VENEGAS Sr. MRN: [id number] Date of Birth: 02-09-1929  [facility]) CM/SW Contact:  Princella Ion, LCSW Phone Number: 12/12/2022, 1:48 PM   Clinical Narrative:    Pt from [facility]. [facility] aware of return. Notified daughter, Windell Moulding, by phone of discharge. [facility] to transport. Discharge Summary sent via hub to SNF.  Report information provided to LPN. No further TOC needs.    Final next level of care: Skilled Nursing Facility Barriers to Discharge: No Barriers Identified   Patient Goals and CMS Choice CMS Medicare.gov Compare Post Acute Care list provided to:: Patient Choice offered to / list presented to : Patient, Adult Children  Discharge Placement                  Patient to be transferred to facility by: [facility] Name of family member notified: Krystal Eaton (daughter) Patient and family notified of of transfer: 12/12/22  Discharge Plan and Services Additional resources added to the After Visit Summary for   In-house Referral: Clinical Social Work Discharge Planning Services: CM Consult Post Acute Care Choice: Skilled Nursing Facility                               Social Determinants of Health (SDOH) Interventions SDOH Screenings   Food Insecurity: No Food Insecurity (12/09/2022)  Housing: Low Risk  (12/09/2022)  Transportation Needs: No Transportation Needs (12/09/2022)  Utilities: Not At Risk (12/09/2022)  Alcohol Screen: Low Risk  (07/24/2019)  Depression (PHQ2-9): Low Risk  (07/24/2019)  Financial Resource Strain: Low Risk  (07/24/2019)  Physical Activity: Inactive (07/24/2019)  Social Connections: Moderately Integrated (07/24/2019)  Stress: No Stress Concern Present (07/24/2019)  Tobacco Use: Low Risk  (11/24/2022)     Readmission Risk Interventions    12/10/2022   10:31 AM 11/29/2022    4:25 PM  Readmission Risk Prevention Plan  Transportation Screening Complete Complete  Medication  Review (RN Care Manager)  Complete  PCP or Specialist appointment within 3-5 days of discharge Complete Complete  HRI or Home Care Consult Complete Complete  SW Recovery Care/Counseling Consult Complete Complete  Palliative Care Screening  Complete  Skilled Nursing Facility Complete Complete

## 2022-12-12 NOTE — Progress Notes (Signed)
Transportation ([facility]) called and pt put on transportation list. Attempted to contact [employer] x 2.

## 2022-12-12 NOTE — Discharge Summary (Signed)
Physician Discharge Summary   Patient: [name]. MRN: [id number] DOB: 1928/07/28  Admit date:     12/09/2022  Discharge date: 12/12/22  Discharge Physician: Onalee Hua Camreigh Michie   PCP: Elfredia Nevins, MD   Recommendations at discharge:   Please follow up with primary care provider within 1-2 weeks  Please repeat BMP and CBC in one week    Hospital Course: 89 year old male with a history of hypertension, paroxysmal atrial fibrillation on Eliquis, peripheral arterial disease status post right fifth toe amputation, chronic indwelling Foley catheter, BPH, prostate cancer who was recently admitted hospital in May 2024 for critical limb ischemia of the right lower extremity with osteomyelitis requiring ray amputation was discharged to inpatient rehab between 09/10/2022 to 09/24/2022 and then subsequently to skilled nursing facility.   The patient has had a functional decline since the beginning of 2024.  He presents from [facility] with increasing confusion and agitation, decreased oral intake for at least 2 days. Daughter states he was confused and agitated like this during his last admission. Notably, the patient was admitted to the hospital from 11/24/2022 to 11/30/2022 with septic shock secondary to CAUTI.  The patient had Proteus bacteremia from urinary source.  In the ED, the patient was afebrile and hemodynamically stable with oxygen saturation 89% room air.  He was placed on 2 L. BMP showed sodium 137, potassium 3.5, bicarbonate 26, serum creatinine 0.80.  WBC 7.1, hemoglobin 8.3, platelets 1-52,000.  Lactic acid peaked at 2.6.  UA showed >50 WBC, CT of the brain was negative.  Chest x-ray showed left basilar atelectasis.  Blood cultures were obtained.  The patient was started on vancomycin and ceftriaxone.  Assessment and Plan: Acute metabolic encephalopathy -Multifactorial including UTI, dehydration -Serum B12 1188 -Folic acid 8.0 -12/11/22-improved, back to baseline with IVF and IV  antibiotics   CAUTI -Patient has chronic indwelling Foley catheter -UA> 50 WBC -continue empiric vanc/ceftriaxone pending cultures -urine culture negative, but was obtained after pt received ceftriaxone and vanc -d/c with cefadroxil x 4 more days to finish 7 day course   Lactic acidosis -Presented with serum lactate 2.6>> 1.4 -Continue IV fluids>>improved   Stage IV sacral decubitus -Present on admission -Wound care consult appreciated>>follow recommendations -Does not appear infected on exam2. >>>>>Clean R ischial and sacral wounds with NS, apply Medihoney to wound beds daily, fill in sacral wound with dry fluffed gauze.  Cover both wounds with dry gauze and silicone foam or ABD pad whichever is preferred. May lift silicone foam daily to replace Medihoney.  Change foam dressing q3 days and prn soiling    Left heel decubitus -Wound care consult appreciated>>follow recommendations -He has eschar type lesion--clinically not infected >>>>>>Clean B heel wounds with NS apply Xeroform gauze [place] [phone]) to wound beds, cover with dry gauze and wrap in Kerlix or silicone foam. Place bilateral feet in Prevalon boots.   >>>>Clean L great toe with NS, paint with Betadine twice daily    Paroxysmal atrial fibrillation -Followed as outpatient by Dr. Rosemary Holms, last seen 7/22. -CHA2DS2-VASc Score 5.  -Continue telemetry.  -08/26/2022 echo EF 65 to 70%, no WMA, normal RVF, mild MR -continue apixaban -rate controlled   PAD/Critical limb ischemia of RLE requiring fifth partial ray amputation History of osteomyelitis -Patient had underwent abdominal aortogram with runoff 5/16 with [facility] Karin Lieu) with recanalization and ballon angioplasty of tibioperoneal trunk. -Ultimately required R fifth partial ray amputation 5/22 with Ortho Lajoyce Corners).   Prostate CA Follows up with Dr. Retta Diones urology as  outpatient.  Has progressive prostate cancer.  Bone imaging was  negative on 2023.  -Currently has chronic  Foley catheter.   Acute on chronic anemia -no signs of active blood loss -likely dilution -transfused one unit PRBC -continue apixaban for now   Physical deconditioning -Discharged to inpatient rehab (5/24 - 6/7) then to SNF.  -General functional decline since the beginning of 2024.            Consultants: none Procedures performed: none  Disposition: Skilled nursing facility Diet recommendation:  Regular diet DISCHARGE MEDICATION: Allergies as of 12/12/2022       Reactions   Codeine Swelling        Medication List     TAKE these medications    acetaminophen 325 MG tablet Commonly known as: TYLENOL Take 2 tablets (650 mg total) by mouth every 6 (six) hours as needed for mild pain, moderate pain, fever or headache.   alum & mag hydroxide-simeth 200-200-20 MG/5ML suspension Commonly known as: MAALOX/MYLANTA Take 15-30 mLs by mouth every 2 (two) hours as needed for indigestion. What changed: how much to take   apixaban 5 MG Tabs tablet Commonly known as: ELIQUIS Take 1 tablet (5 mg total) by mouth 2 (two) times daily.   ascorbic acid 1000 MG tablet Commonly known as: VITAMIN C Take 1 tablet (1,000 mg total) by mouth daily.   atorvastatin 40 MG tablet Commonly known as: LIPITOR Take 1 tablet (40 mg total) by mouth daily.   cefadroxil 500 MG capsule Commonly known as: DURICEF Take 1 capsule (500 mg total) by mouth 2 (two) times daily. X 4 days   furosemide 20 MG tablet Commonly known as: LASIX Take 1 tablet (20 mg total) by mouth daily.   guaiFENesin-dextromethorphan 100-10 MG/5ML syrup Commonly known as: ROBITUSSIN DM Take 15 mLs by mouth every 4 (four) hours as needed for cough.   lactose free nutrition Liqd Take 237 mLs by mouth 2 (two) times daily.   leptospermum manuka honey Pste paste Apply 1 Application topically daily. Start taking on: December 13, 2022   multivitamin with minerals Tabs tablet Take 1 tablet by mouth daily.   omega-3 acid  ethyl esters 1 g capsule Commonly known as: LOVAZA Take 1 capsule (1 g total) by mouth daily.   ondansetron 4 MG tablet Commonly known as: ZOFRAN Take 4 mg by mouth every 6 (six) hours as needed for nausea or vomiting.   pantoprazole 40 MG tablet Commonly known as: PROTONIX Take 1 tablet (40 mg total) by mouth 2 (two) times daily.   polyethylene glycol 17 g packet Commonly known as: MIRALAX / GLYCOLAX Take 17 g by mouth daily. Hold for loose BM.   PRO-STAT PROFILE PO Take 30 mLs by mouth 3 (three) times daily.   senna-docusate 8.6-50 MG tablet Commonly known as: Senokot-S Take 1 tablet by mouth 2 (two) times daily.   tamsulosin 0.4 MG Caps capsule Commonly known as: FLOMAX Take 0.4 mg by mouth daily after supper.        Discharge Exam: Filed Weights   12/10/22 0611 12/11/22 0500 12/12/22 0447  Weight: 85.5 kg 89.8 kg 88 kg   HEENT:  /AT, No thrush, no icterus CV:  RRR, no rub, no S3, no S4 Lung:  bibasilar rales. No qwheeze Abd:  soft/+BS, NT Ext:  No edema, no lymphangitis, no synovitis, no rash   Condition at discharge: stable  The results of significant diagnostics from this hospitalization (including imaging, microbiology, ancillary and laboratory) are listed  below for reference.   Imaging Studies: ECHOCARDIOGRAM LIMITED  Result Date: 12/11/2022    ECHOCARDIOGRAM LIMITED REPORT   Patient Name:   ZARIF LES Patterson. Date of Exam: 12/11/2022 Medical Rec #:  [id number]        Height:       67.0 in Accession #:    [id number]       Weight:       198.0 lb Date of Birth:  1929-04-06        BSA:          2.014 m Patient Age:    89 years         BP:           121/52 mmHg Patient Gender: M                HR:           53 bpm. [facility] Procedure: Limited Echo Indications:    CHF-Acute Diastolic I50.31  History:        Patient has prior history of Echocardiogram examinations, most                 recent 08/26/2022. Arrythmias:Atrial Fibrillation; Risk                  Factors:Hypertension, Dyslipidemia and Non-Smoker.  Sonographer:    Celesta Gentile RCS Referring Phys: [id number] OLADAPO ADEFESO IMPRESSIONS  1. Left ventricular ejection fraction, by estimation, is 60 to 65%. The left ventricle has normal function. There is mild left ventricular hypertrophy.  2. Right ventricular systolic function is normal. The right ventricular size is normal. Tricuspid regurgitation signal is inadequate for assessing PA pressure.  3. The inferior vena cava is normal in size with <50% respiratory variability, suggesting right atrial pressure of 8 mmHg. FINDINGS  Left Ventricle: Left ventricular ejection fraction, by estimation, is 60 to 65%. The left ventricle has normal function. The left ventricular internal cavity size was normal in size. There is mild left ventricular hypertrophy. Right Ventricle: The right ventricular size is normal. No increase in right ventricular wall thickness. Right ventricular systolic function is normal. Tricuspid regurgitation signal is inadequate for assessing PA pressure. Pericardium: There is no evidence of pericardial effusion. Aorta: The aortic root is normal in size and structure. Venous: The inferior vena cava is normal in size with less than 50% respiratory variability, suggesting right atrial pressure of 8 mmHg. LEFT VENTRICLE PLAX 2D LVIDd:         4.20 cm LVIDs:         2.80 cm LV PW:         1.30 cm LV IVS:        1.10 cm LVOT diam:     1.90 cm LVOT Area:     2.84 cm  RIGHT VENTRICLE TAPSE (M-mode): 1.6 cm LEFT ATRIUM         Index LA diam:    3.60 cm 1.79 cm/m   AORTA Ao Root diam: 3.20 cm  SHUNTS Systemic Diam: 1.90 cm Epifanio Lesches MD Electronically signed by Epifanio Lesches MD Signature Date/Time: 12/11/2022/1:29:22 PM    Final    MR Lumbar Spine W Wo Contrast  Result Date: 12/10/2022 CLINICAL DATA:  Lumbar osteomyelitis. History of prostate cancer. Recent bacteremia. EXAM: MRI LUMBAR SPINE WITHOUT AND WITH CONTRAST TECHNIQUE:  Multiplanar and multiecho pulse sequences of the lumbar spine were obtained without and with intravenous contrast. CONTRAST:  8mL GADAVIST GADOBUTROL 1 MMOL/ML  IV SOLN COMPARISON:  CT abdomen and pelvis 08/24/2022 FINDINGS: Multiple sequences are up to moderately motion degraded. Segmentation: Standard. Alignment: Mildly exaggerated lumbar lordosis. Grade 1 anterolisthesis of L4 on L5. Vertebrae: No fracture or suspicious marrow lesion. Multilevel degenerative endplate changes and Schmorl's nodes. No findings strongly suggestive of discitis-osteomyelitis in the lumbar spine within limitations of motion artifact on STIR and postcontrast sequences. Prominent marrow T1 hyperintensity, particularly in the included pelvis, query prior radiation therapy. Conus medullaris and cauda equina: Conus extends to the L1 level. Conus and cauda equina appear normal. Paraspinal and other soft tissues: Bilateral renal cysts for which no follow-up imaging is recommended. Partially visualized sacral decubitus ulcer without gross evidence osteomyelitis within the included portion of the sacrum, however this was not evaluated primarily on this lumbar spine MRI and the entire ulcer and sacrum were not included on this study. Partially visualized nonspecific presacral edema. Diffuse fatty atrophy of the paraspinal musculature with nonspecific edema in the posterior paraspinal soft tissues. No evidence of lumbar paraspinal abscess. Chronic 3.2 x 1.8 cm cystic focus at the posterosuperior aspect of the left gluteus medius muscle, stable to mildly smaller than on the prior CT. Disc levels: Disc desiccation throughout the lumbar spine. Severe disc space narrowing at L4-5 and mild narrowing at L1-2 and L3-4. T12-L1 minimal disc bulging and mild facet hypertrophy without stenosis. L1-2: Disc bulging and mild-to-moderate facet and ligamentum flavum hypertrophy without significant stenosis. L2-3: Disc bulging and moderate facet and ligamentum  flavum hypertrophy result in mild spinal stenosis, mild bilateral lateral recess stenosis, and moderate left greater than right neural foraminal stenosis. L3-4: Disc bulging and mild-to-moderate facet and ligamentum flavum hypertrophy result in mild spinal stenosis, mild bilateral lateral recess stenosis, and mild-to-moderate bilateral neural foraminal stenosis. L4-5: Anterolisthesis with bulging uncovered disc, endplate spurring, and severe facet and ligamentum flavum hypertrophy result in severe spinal stenosis and moderate bilateral neural foraminal stenosis. L5-S1: Disc bulging and severe facet hypertrophy result in mild bilateral lateral recess stenosis and mild bilateral neural foraminal stenosis without significant spinal stenosis. IMPRESSION: 1. Motion degraded examination without evidence of lumbar discitis-osteomyelitis or paraspinal abscess. 2. Partially visualized sacral decubitus ulcer. A pelvic MRI would be needed to fully evaluate this region including any potential for sacral osteomyelitis if clinically warranted. 3. Diffuse lumbar disc and facet degeneration, most notable at L4-5 where there is severe spinal stenosis and moderate bilateral neural foraminal stenosis. Electronically Signed   By: Sebastian Ache M.D.   On: 12/10/2022 17:37   CT Head Wo Contrast  Result Date: 12/09/2022 CLINICAL DATA:  Mental status change, persistent or worsening EXAM: CT HEAD WITHOUT CONTRAST TECHNIQUE: Contiguous axial images were obtained from the base of the skull through the vertex without intravenous contrast. RADIATION DOSE REDUCTION: This exam was performed according to the departmental dose-optimization program which includes automated exposure control, adjustment of the mA and/or kV according to patient size and/or use of iterative reconstruction technique. COMPARISON:  12/09/2010 FINDINGS: Brain: There is atrophy and chronic small vessel disease changes. No acute intracranial abnormality. Specifically, no  hemorrhage, hydrocephalus, mass lesion, acute infarction, or significant intracranial injury. Vascular: No hyperdense vessel or unexpected calcification. Skull: No acute calvarial abnormality. Sinuses/Orbits: No acute findings Other: None IMPRESSION: Atrophy, chronic microvascular disease. No acute intracranial abnormality. Electronically Signed   By: Charlett Nose M.D.   On: 12/09/2022 20:20   DG Chest Port 1 View  Result Date: 12/09/2022 CLINICAL DATA:  hypoxia EXAM: PORTABLE CHEST 1 VIEW COMPARISON:  11/24/2022.  FINDINGS: Probable atelectatic changes at the left lung base with associated small left pleural effusion. Bilateral lungs are otherwise clear. Right lateral costophrenic angle is clear. Stable cardio-mediastinal silhouette. Aortic arch calcifications noted. No acute osseous abnormalities. The soft tissues are within normal limits. IMPRESSION: Probable atelectatic changes at the left lung base with associated small left pleural effusion. Electronically Signed   By: Jules Schick M.D.   On: 12/09/2022 16:40   DG Chest Port 1 View  Result Date: 11/24/2022 CLINICAL DATA:  Sepsis EXAM: PORTABLE CHEST 1 VIEW COMPARISON:  08/24/2022 x-ray and CT angiogram FINDINGS: The heart size and mediastinal contours are within normal limits. Film is rotated to the right. Calcified aorta. No consolidation, pneumothorax or effusion. No edema. Overlapping cardiac leads. The visualized skeletal structures are unremarkable. IMPRESSION: Rotated radiograph.  No acute cardiopulmonary disease. Electronically Signed   By: Karen Kays M.D.   On: 11/24/2022 16:27    Microbiology: Results for orders placed or performed during the hospital encounter of 12/09/22  Blood culture (routine x 2)     Status: None (Preliminary result)   Collection Time: 12/09/22  3:13 PM   Specimen: BLOOD  Result Value Ref Range Status   Specimen Description BLOOD RFOA  Final   Special Requests   Final    BOTTLES DRAWN AEROBIC AND ANAEROBIC Blood  Culture adequate volume   Culture   Final    NO GROWTH 3 DAYS Performed at [facility], [address] [place], [place] [address]    Report Status PENDING  Incomplete  Blood culture (routine x 2)     Status: None (Preliminary result)   Collection Time: 12/09/22  3:13 PM   Specimen: BLOOD  Result Value Ref Range Status   Specimen Description BLOOD RW  Final   Special Requests   Final    BOTTLES DRAWN AEROBIC AND ANAEROBIC Blood Culture adequate volume   Culture   Final    NO GROWTH 3 DAYS Performed at [facility], [address], [place], [place] [address]    Report Status PENDING  Incomplete  Resp panel by RT-PCR (RSV, Flu A&B, Covid) Anterior Nasal Swab     Status: None   Collection Time: 12/09/22  4:41 PM   Specimen: Anterior Nasal Swab  Result Value Ref Range Status   SARS Coronavirus 2 by RT PCR NEGATIVE NEGATIVE Final    Comment: (NOTE) SARS-CoV-2 target nucleic acids are NOT DETECTED.  The SARS-CoV-2 RNA is generally detectable in upper respiratory specimens during the acute phase of infection. The lowest concentration of SARS-CoV-2 viral copies this assay can detect is 138 copies/mL. A negative result does not preclude SARS-Cov-2 infection and should not be used as the sole basis for treatment or other patient management decisions. A negative result may occur with  improper specimen collection/handling, submission of specimen other than nasopharyngeal swab, presence of viral mutation(s) within the areas targeted by this assay, and inadequate number of viral copies(<138 copies/mL). A negative result must be combined with clinical observations, patient history, and epidemiological information. The expected result is Negative.  Fact Sheet for Patients:  BloggerCourse.com  Fact Sheet for Healthcare Providers:  SeriousBroker.it  This test is no t yet approved or cleared by the [place] FDA and  has been authorized  for detection and/or diagnosis of SARS-CoV-2 by FDA under an Emergency Use Authorization (EUA). This EUA will remain  in effect (meaning this test can be used) for the duration of the COVID-19 declaration under Section 564(b)(1) of  the Act, 21 U.S.C.section 360bbb-3(b)(1), unless the authorization is terminated  or revoked sooner.       Influenza A by PCR NEGATIVE NEGATIVE Final   Influenza B by PCR NEGATIVE NEGATIVE Final    Comment: (NOTE) The Xpert Xpress SARS-CoV-2/FLU/RSV plus assay is intended as an aid in the diagnosis of influenza from Nasopharyngeal swab specimens and should not be used as a sole basis for treatment. Nasal washings and aspirates are unacceptable for Xpert Xpress SARS-CoV-2/FLU/RSV testing.  Fact Sheet for Patients: BloggerCourse.com  Fact Sheet for Healthcare Providers: SeriousBroker.it  This test is not yet approved or cleared by the [place] FDA and has been authorized for detection and/or diagnosis of SARS-CoV-2 by FDA under an Emergency Use Authorization (EUA). This EUA will remain in effect (meaning this test can be used) for the duration of the COVID-19 declaration under Section 564(b)(1) of the Act, 21 U.S.C. section 360bbb-3(b)(1), unless the authorization is terminated or revoked.     Resp Syncytial Virus by PCR NEGATIVE NEGATIVE Final    Comment: (NOTE) Fact Sheet for Patients: BloggerCourse.com  Fact Sheet for Healthcare Providers: SeriousBroker.it  This test is not yet approved or cleared by the [place] FDA and has been authorized for detection and/or diagnosis of SARS-CoV-2 by FDA under an Emergency Use Authorization (EUA). This EUA will remain in effect (meaning this test can be used) for the duration of the COVID-19 declaration under Section 564(b)(1) of the Act, 21 U.S.C. section 360bbb-3(b)(1), unless the authorization is  terminated or revoked.  Performed at [facility], [address] [place], [place] [address]   Culture, Urine (Do not remove urinary catheter, catheter placed by urology or difficult to place)     Status: None   Collection Time: 12/10/22 12:02 PM   Specimen: Urine, Catheterized  Result Value Ref Range Status   Specimen Description   Final    URINE, CATHETERIZED Performed at [facility], [address] [place], [place] [address]    Special Requests   Final    NONE Performed at [facility], [address] [place], [place] [address]    Culture   Final    NO GROWTH Performed at [facility], [address] [place], [place] [address]    Report Status 12/11/2022 FINAL  Final    Labs: CBC: Recent Labs  Lab 12/09/22 1536 12/10/22 0052 12/10/22 0447 12/11/22 0515  WBC 7.1 7.8  --  8.1  NEUTROABS 4.9  --   --   --   HGB 8.3* 6.9* 6.7* 8.4*  HCT 26.6* 22.5* 21.8* 26.5*  MCV 103.1* 103.7*  --  100.8*  PLT 458* 392  --  375   Basic Metabolic Panel: Recent Labs  Lab 12/09/22 1536 12/10/22 0052 12/11/22 0515 12/12/22 0408  NA 136 137 136 133*  K 3.9 3.5 3.2* 3.4*  CL 98 101 102 100  CO2 25 26 28 27   GLUCOSE 108* 82 80 101*  BUN 33* 31* 26* 26*  CREATININE 0.86 0.80 0.84 0.90  CALCIUM 8.2* 8.1* 8.2* 8.2*  MG  --  1.8 1.9 1.7  PHOS  --  2.7  --   --    Liver Function Tests: Recent Labs  Lab 12/09/22 1536 12/10/22 0052  AST 35 30  ALT 30 26  ALKPHOS 104 93  BILITOT 0.9 1.1  PROT 6.3* 5.4*  ALBUMIN 2.4* 2.1*   CBG: Recent Labs  Lab 12/11/22 0621 12/11/22 1805 12/12/22 0046 12/12/22 0558 12/12/22 1104  GLUCAP 78 99 85 90 127*    Discharge time spent: greater than 30 minutes.  Signed: Catarina Hartshorn, MD [facility] 12/12/2022

## 2022-12-12 NOTE — Progress Notes (Signed)
Pt tolerates dressing change to sacrum well, betadine applied to L great toe and pt complains of slight pain when it is touched. Medi honey used to coat wound bed and dry gauze applied with ABD pads to cover. Small mepilex used for stage 2 in gluteal fold. CHG bath given with peri and foley care. Pt does not complain of any pain at this time, resting comfortably in bed. Plan of care ongoing.

## 2022-12-13 DIAGNOSIS — G9341 Metabolic encephalopathy: Secondary | ICD-10-CM | POA: Diagnosis not present

## 2022-12-13 DIAGNOSIS — N390 Urinary tract infection, site not specified: Secondary | ICD-10-CM | POA: Diagnosis not present

## 2022-12-14 LAB — CULTURE, BLOOD (ROUTINE X 2)
Culture: NO GROWTH
Culture: NO GROWTH
Special Requests: ADEQUATE
Special Requests: ADEQUATE

## 2022-12-14 LAB — TYPE AND SCREEN
ABO/RH(D): AB POS
Antibody Screen: POSITIVE
DAT, IgG: NEGATIVE
DAT, complement: NEGATIVE
Donor AG Type: NEGATIVE
Donor AG Type: NEGATIVE
Unit division: 0
Unit division: 0

## 2022-12-14 LAB — BPAM RBC
Blood Product Expiration Date: 202409052359
ISSUE DATE / TIME: 202408231649
Unit Type and Rh: 6200

## 2022-12-15 DIAGNOSIS — R609 Edema, unspecified: Secondary | ICD-10-CM | POA: Diagnosis not present

## 2022-12-15 DIAGNOSIS — R159 Full incontinence of feces: Secondary | ICD-10-CM | POA: Diagnosis not present

## 2022-12-15 DIAGNOSIS — N39498 Other specified urinary incontinence: Secondary | ICD-10-CM | POA: Diagnosis not present

## 2022-12-15 DIAGNOSIS — I739 Peripheral vascular disease, unspecified: Secondary | ICD-10-CM | POA: Diagnosis not present

## 2022-12-15 DIAGNOSIS — R600 Localized edema: Secondary | ICD-10-CM | POA: Diagnosis not present

## 2022-12-15 DIAGNOSIS — L89620 Pressure ulcer of left heel, unstageable: Secondary | ICD-10-CM | POA: Diagnosis not present

## 2022-12-15 DIAGNOSIS — M6281 Muscle weakness (generalized): Secondary | ICD-10-CM | POA: Diagnosis not present

## 2022-12-15 DIAGNOSIS — L89610 Pressure ulcer of right heel, unstageable: Secondary | ICD-10-CM | POA: Diagnosis not present

## 2022-12-15 DIAGNOSIS — L89154 Pressure ulcer of sacral region, stage 4: Secondary | ICD-10-CM | POA: Diagnosis not present

## 2022-12-22 DIAGNOSIS — M6281 Muscle weakness (generalized): Secondary | ICD-10-CM | POA: Diagnosis not present

## 2022-12-22 DIAGNOSIS — R159 Full incontinence of feces: Secondary | ICD-10-CM | POA: Diagnosis not present

## 2022-12-22 DIAGNOSIS — N39498 Other specified urinary incontinence: Secondary | ICD-10-CM | POA: Diagnosis not present

## 2022-12-22 DIAGNOSIS — R609 Edema, unspecified: Secondary | ICD-10-CM | POA: Diagnosis not present

## 2022-12-22 DIAGNOSIS — I739 Peripheral vascular disease, unspecified: Secondary | ICD-10-CM | POA: Diagnosis not present

## 2022-12-22 DIAGNOSIS — L89610 Pressure ulcer of right heel, unstageable: Secondary | ICD-10-CM | POA: Diagnosis not present

## 2022-12-22 DIAGNOSIS — L89154 Pressure ulcer of sacral region, stage 4: Secondary | ICD-10-CM | POA: Diagnosis not present

## 2022-12-22 DIAGNOSIS — R600 Localized edema: Secondary | ICD-10-CM | POA: Diagnosis not present

## 2022-12-22 DIAGNOSIS — L89620 Pressure ulcer of left heel, unstageable: Secondary | ICD-10-CM | POA: Diagnosis not present

## 2022-12-24 ENCOUNTER — Encounter (HOSPITAL_COMMUNITY): Payer: Self-pay

## 2022-12-24 ENCOUNTER — Emergency Department (HOSPITAL_COMMUNITY)
Admission: EM | Admit: 2022-12-24 | Discharge: 2022-12-24 | Disposition: A | Discharge status: Nursing Home (Includes ICF) | Payer: Medicare Other | Attending: Emergency Medicine | Admitting: Emergency Medicine

## 2022-12-24 DIAGNOSIS — I10 Essential (primary) hypertension: Secondary | ICD-10-CM | POA: Insufficient documentation

## 2022-12-24 DIAGNOSIS — R4182 Altered mental status, unspecified: Secondary | ICD-10-CM | POA: Insufficient documentation

## 2022-12-24 DIAGNOSIS — F0390 Unspecified dementia without behavioral disturbance: Secondary | ICD-10-CM | POA: Diagnosis not present

## 2022-12-24 DIAGNOSIS — R0902 Hypoxemia: Secondary | ICD-10-CM | POA: Insufficient documentation

## 2022-12-24 DIAGNOSIS — R55 Syncope and collapse: Secondary | ICD-10-CM | POA: Diagnosis not present

## 2022-12-24 DIAGNOSIS — Z7901 Long term (current) use of anticoagulants: Secondary | ICD-10-CM | POA: Insufficient documentation

## 2022-12-24 NOTE — ED Provider Notes (Signed)
[place] EMERGENCY DEPARTMENT AT [facility] Provider Note   CSN: [id number] Arrival date & time: 12/24/22  1923     History {Add pertinent medical, surgical, social history, OB history to HPI:1} Chief Complaint  Patient presents with   Altered Mental Status    Jesse AUSTGEN Sr. is a 89 y.o. male.  Patient has a history of hypertension dementia and is on oxygen.  He was found unresponsive.  He did not have his oxygen on.  His sats were 67%.   Altered Mental Status      Home Medications Prior to Admission medications   Medication Sig Start Date End Date Taking? Authorizing Provider  acetaminophen (TYLENOL) 325 MG tablet Take 2 tablets (650 mg total) by mouth every 6 (six) hours as needed for mild pain, moderate pain, fever or headache. 11/30/22   Elgergawy, Leana Roe, MD  alum & mag hydroxide-simeth (MAALOX/MYLANTA) 200-200-20 MG/5ML suspension Take 15-30 mLs by mouth every 2 (two) hours as needed for indigestion. Patient taking differently: Take 30 mLs by mouth every 2 (two) hours as needed for indigestion. 09/10/22   Pennie Banter, DO  Amino Acids-Protein Hydrolys (PRO-STAT PROFILE PO) Take 30 mLs by mouth 3 (three) times daily.    [provider]  apixaban (ELIQUIS) 5 MG TABS tablet Take 1 tablet (5 mg total) by mouth 2 (two) times daily. 09/10/22   Pennie Banter, DO  ascorbic acid (VITAMIN C) 1000 MG tablet Take 1 tablet (1,000 mg total) by mouth daily. 09/11/22   Pennie Banter, DO  atorvastatin (LIPITOR) 40 MG tablet Take 1 tablet (40 mg total) by mouth daily. 09/11/22   Esaw Grandchild A, DO  cefadroxil (DURICEF) 500 MG capsule Take 1 capsule (500 mg total) by mouth 2 (two) times daily. X 4 days 12/12/22   Catarina Hartshorn, MD  furosemide (LASIX) 20 MG tablet Take 1 tablet (20 mg total) by mouth daily. 09/23/22   Love, Evlyn Kanner, PA-C  guaiFENesin-dextromethorphan (ROBITUSSIN DM) 100-10 MG/5ML syrup Take 15 mLs by mouth every 4 (four) hours as needed for  cough.    [provider]  lactose free nutrition (BOOST) LIQD Take 237 mLs by mouth 2 (two) times daily.    [provider]  leptospermum manuka honey (MEDIHONEY) PSTE paste Apply 1 Application topically daily. 12/13/22   Catarina Hartshorn, MD  Multiple Vitamin (MULTIVITAMIN WITH MINERALS) TABS tablet Take 1 tablet by mouth daily. 09/11/22   Pennie Banter, DO  omega-3 acid ethyl esters (LOVAZA) 1 g capsule Take 1 capsule (1 g total) by mouth daily. 09/11/22   Esaw Grandchild A, DO  ondansetron (ZOFRAN) 4 MG tablet Take 4 mg by mouth every 6 (six) hours as needed for nausea or vomiting.    [provider]  pantoprazole (PROTONIX) 40 MG tablet Take 1 tablet (40 mg total) by mouth 2 (two) times daily. 09/23/22   Love, Evlyn Kanner, PA-C  polyethylene glycol (MIRALAX / GLYCOLAX) 17 g packet Take 17 g by mouth daily. Hold for loose BM.    [provider]  senna-docusate (SENOKOT-S) 8.6-50 MG tablet Take 1 tablet by mouth 2 (two) times daily. 09/10/22   Pennie Banter, DO  tamsulosin (FLOMAX) 0.4 MG CAPS capsule Take 0.4 mg by mouth daily after supper.    [provider]      Allergies    Codeine    Review of Systems   Review of Systems  Physical Exam Updated Vital Signs BP Marland Kitchen)  105/42   Pulse 75   Temp (!) 97.3 F (36.3 C) (Rectal)   Resp 20   Ht 5\' 7"  (1.702 m)   Wt 87.9 kg   SpO2 100%   BMI 30.35 kg/m  Physical Exam  ED Results / Procedures / Treatments   Labs (all labs ordered are listed, but only abnormal results are displayed) Labs Reviewed - No data to display  EKG None  Radiology No results found.  Procedures Procedures  {Document cardiac monitor, telemetry assessment procedure when appropriate:1}  Medications Ordered in ED Medications - No data to display  ED Course/ Medical Decision Making/ A&P   {Patient with hypoxia that resolved with putting back his 3 L nasal.  He is oriented x 4 and will be sent back to the nursing  home Click here for ABCD2, HEART and other calculatorsREFRESH Note before signing :1}                              Medical Decision Making  Hypoxia  {Document critical care time when appropriate:1} {Document review of labs and clinical decision tools ie heart score, Chads2Vasc2 etc:1}  {Document your independent review of radiology images, and any outside records:1} {Document your discussion with family members, caretakers, and with consultants:1} {Document social determinants of health affecting pt's care:1} {Document your decision making why or why not admission, treatments were needed:1} Final Clinical Impression(s) / ED Diagnoses Final diagnoses:  Hypoxia    Rx / DC Orders ED Discharge Orders     None

## 2022-12-24 NOTE — ED Triage Notes (Signed)
Pt bib EMS from [facility] for AMS. Staff at facility stated that he was unresponsive which prompted staff to call EMS. Upon arrival, pt was responsive with a SPO2 of 67 on RA. Staff stated to EMS that "they must have forgotten to put his O2 back on". Pt normally wears 3 L O2, EMS administered O2 at pt is back up to 100. Pt himself is only complaining of being cold currently.

## 2022-12-24 NOTE — ED Notes (Signed)
Pt has stage 4 pressure ulcer on sacrum. Wound has new bandage over it and it dated and times. Dr aware.

## 2022-12-24 NOTE — Discharge Instructions (Signed)
Follow up with your md if needed °

## 2022-12-29 DIAGNOSIS — I739 Peripheral vascular disease, unspecified: Secondary | ICD-10-CM | POA: Diagnosis not present

## 2022-12-29 DIAGNOSIS — R159 Full incontinence of feces: Secondary | ICD-10-CM | POA: Diagnosis not present

## 2022-12-29 DIAGNOSIS — L89620 Pressure ulcer of left heel, unstageable: Secondary | ICD-10-CM | POA: Diagnosis not present

## 2022-12-29 DIAGNOSIS — M6281 Muscle weakness (generalized): Secondary | ICD-10-CM | POA: Diagnosis not present

## 2022-12-29 DIAGNOSIS — L89610 Pressure ulcer of right heel, unstageable: Secondary | ICD-10-CM | POA: Diagnosis not present

## 2022-12-29 DIAGNOSIS — L89154 Pressure ulcer of sacral region, stage 4: Secondary | ICD-10-CM | POA: Diagnosis not present

## 2022-12-29 DIAGNOSIS — R609 Edema, unspecified: Secondary | ICD-10-CM | POA: Diagnosis not present

## 2022-12-29 DIAGNOSIS — N39498 Other specified urinary incontinence: Secondary | ICD-10-CM | POA: Diagnosis not present

## 2023-01-05 DIAGNOSIS — R159 Full incontinence of feces: Secondary | ICD-10-CM | POA: Diagnosis not present

## 2023-01-05 DIAGNOSIS — I739 Peripheral vascular disease, unspecified: Secondary | ICD-10-CM | POA: Diagnosis not present

## 2023-01-05 DIAGNOSIS — R5381 Other malaise: Secondary | ICD-10-CM | POA: Diagnosis not present

## 2023-01-05 DIAGNOSIS — I4891 Unspecified atrial fibrillation: Secondary | ICD-10-CM | POA: Diagnosis not present

## 2023-01-05 DIAGNOSIS — R609 Edema, unspecified: Secondary | ICD-10-CM | POA: Diagnosis not present

## 2023-01-05 DIAGNOSIS — I480 Paroxysmal atrial fibrillation: Secondary | ICD-10-CM | POA: Diagnosis not present

## 2023-01-05 DIAGNOSIS — N39498 Other specified urinary incontinence: Secondary | ICD-10-CM | POA: Diagnosis not present

## 2023-01-05 DIAGNOSIS — L89620 Pressure ulcer of left heel, unstageable: Secondary | ICD-10-CM | POA: Diagnosis not present

## 2023-01-05 DIAGNOSIS — M6281 Muscle weakness (generalized): Secondary | ICD-10-CM | POA: Diagnosis not present

## 2023-01-05 DIAGNOSIS — L89610 Pressure ulcer of right heel, unstageable: Secondary | ICD-10-CM | POA: Diagnosis not present

## 2023-01-05 DIAGNOSIS — N390 Urinary tract infection, site not specified: Secondary | ICD-10-CM | POA: Diagnosis not present

## 2023-01-05 DIAGNOSIS — L89154 Pressure ulcer of sacral region, stage 4: Secondary | ICD-10-CM | POA: Diagnosis not present

## 2023-01-06 DIAGNOSIS — N138 Other obstructive and reflux uropathy: Secondary | ICD-10-CM | POA: Diagnosis not present

## 2023-01-12 DIAGNOSIS — K2100 Gastro-esophageal reflux disease with esophagitis, without bleeding: Secondary | ICD-10-CM | POA: Diagnosis not present

## 2023-01-12 DIAGNOSIS — I739 Peripheral vascular disease, unspecified: Secondary | ICD-10-CM | POA: Diagnosis not present

## 2023-01-12 DIAGNOSIS — C61 Malignant neoplasm of prostate: Secondary | ICD-10-CM | POA: Diagnosis not present

## 2023-01-12 DIAGNOSIS — Z89421 Acquired absence of other right toe(s): Secondary | ICD-10-CM | POA: Diagnosis not present

## 2023-01-12 DIAGNOSIS — L89154 Pressure ulcer of sacral region, stage 4: Secondary | ICD-10-CM | POA: Diagnosis not present

## 2023-01-12 DIAGNOSIS — N400 Enlarged prostate without lower urinary tract symptoms: Secondary | ICD-10-CM | POA: Diagnosis not present

## 2023-01-12 DIAGNOSIS — R41841 Cognitive communication deficit: Secondary | ICD-10-CM | POA: Diagnosis not present

## 2023-01-12 DIAGNOSIS — M869 Osteomyelitis, unspecified: Secondary | ICD-10-CM | POA: Diagnosis not present

## 2023-01-12 DIAGNOSIS — J42 Unspecified chronic bronchitis: Secondary | ICD-10-CM | POA: Diagnosis not present

## 2023-01-12 DIAGNOSIS — I480 Paroxysmal atrial fibrillation: Secondary | ICD-10-CM | POA: Diagnosis not present

## 2023-01-12 DIAGNOSIS — E46 Unspecified protein-calorie malnutrition: Secondary | ICD-10-CM | POA: Diagnosis not present

## 2023-01-12 DIAGNOSIS — D649 Anemia, unspecified: Secondary | ICD-10-CM | POA: Diagnosis not present

## 2023-01-12 DIAGNOSIS — I10 Essential (primary) hypertension: Secondary | ICD-10-CM | POA: Diagnosis not present

## 2023-01-12 DIAGNOSIS — Z515 Encounter for palliative care: Secondary | ICD-10-CM | POA: Diagnosis not present

## 2023-01-13 DIAGNOSIS — D649 Anemia, unspecified: Secondary | ICD-10-CM | POA: Diagnosis not present

## 2023-01-13 DIAGNOSIS — E46 Unspecified protein-calorie malnutrition: Secondary | ICD-10-CM | POA: Diagnosis not present

## 2023-01-13 DIAGNOSIS — Z89421 Acquired absence of other right toe(s): Secondary | ICD-10-CM | POA: Diagnosis not present

## 2023-01-13 DIAGNOSIS — R41841 Cognitive communication deficit: Secondary | ICD-10-CM | POA: Diagnosis not present

## 2023-01-13 DIAGNOSIS — C61 Malignant neoplasm of prostate: Secondary | ICD-10-CM | POA: Diagnosis not present

## 2023-01-13 DIAGNOSIS — I10 Essential (primary) hypertension: Secondary | ICD-10-CM | POA: Diagnosis not present

## 2023-01-17 DIAGNOSIS — I10 Essential (primary) hypertension: Secondary | ICD-10-CM | POA: Diagnosis not present

## 2023-01-17 DIAGNOSIS — Z89421 Acquired absence of other right toe(s): Secondary | ICD-10-CM | POA: Diagnosis not present

## 2023-01-17 DIAGNOSIS — R41841 Cognitive communication deficit: Secondary | ICD-10-CM | POA: Diagnosis not present

## 2023-01-17 DIAGNOSIS — D649 Anemia, unspecified: Secondary | ICD-10-CM | POA: Diagnosis not present

## 2023-01-17 DIAGNOSIS — E46 Unspecified protein-calorie malnutrition: Secondary | ICD-10-CM | POA: Diagnosis not present

## 2023-01-17 DIAGNOSIS — C61 Malignant neoplasm of prostate: Secondary | ICD-10-CM | POA: Diagnosis not present

## 2023-01-18 DIAGNOSIS — K2100 Gastro-esophageal reflux disease with esophagitis, without bleeding: Secondary | ICD-10-CM | POA: Diagnosis not present

## 2023-01-18 DIAGNOSIS — M869 Osteomyelitis, unspecified: Secondary | ICD-10-CM | POA: Diagnosis not present

## 2023-01-18 DIAGNOSIS — R41841 Cognitive communication deficit: Secondary | ICD-10-CM | POA: Diagnosis not present

## 2023-01-18 DIAGNOSIS — N400 Enlarged prostate without lower urinary tract symptoms: Secondary | ICD-10-CM | POA: Diagnosis not present

## 2023-01-18 DIAGNOSIS — E46 Unspecified protein-calorie malnutrition: Secondary | ICD-10-CM | POA: Diagnosis not present

## 2023-01-18 DIAGNOSIS — C61 Malignant neoplasm of prostate: Secondary | ICD-10-CM | POA: Diagnosis not present

## 2023-01-18 DIAGNOSIS — L89154 Pressure ulcer of sacral region, stage 4: Secondary | ICD-10-CM | POA: Diagnosis not present

## 2023-01-18 DIAGNOSIS — I10 Essential (primary) hypertension: Secondary | ICD-10-CM | POA: Diagnosis not present

## 2023-01-18 DIAGNOSIS — D649 Anemia, unspecified: Secondary | ICD-10-CM | POA: Diagnosis not present

## 2023-01-18 DIAGNOSIS — I739 Peripheral vascular disease, unspecified: Secondary | ICD-10-CM | POA: Diagnosis not present

## 2023-01-18 DIAGNOSIS — Z89421 Acquired absence of other right toe(s): Secondary | ICD-10-CM | POA: Diagnosis not present

## 2023-01-18 DIAGNOSIS — I480 Paroxysmal atrial fibrillation: Secondary | ICD-10-CM | POA: Diagnosis not present

## 2023-01-18 DIAGNOSIS — J42 Unspecified chronic bronchitis: Secondary | ICD-10-CM | POA: Diagnosis not present

## 2023-01-19 DIAGNOSIS — C61 Malignant neoplasm of prostate: Secondary | ICD-10-CM | POA: Diagnosis not present

## 2023-01-19 DIAGNOSIS — R41841 Cognitive communication deficit: Secondary | ICD-10-CM | POA: Diagnosis not present

## 2023-01-19 DIAGNOSIS — E46 Unspecified protein-calorie malnutrition: Secondary | ICD-10-CM | POA: Diagnosis not present

## 2023-01-19 DIAGNOSIS — D649 Anemia, unspecified: Secondary | ICD-10-CM | POA: Diagnosis not present

## 2023-01-19 DIAGNOSIS — I10 Essential (primary) hypertension: Secondary | ICD-10-CM | POA: Diagnosis not present

## 2023-01-19 DIAGNOSIS — Z89421 Acquired absence of other right toe(s): Secondary | ICD-10-CM | POA: Diagnosis not present

## 2023-01-21 DIAGNOSIS — I10 Essential (primary) hypertension: Secondary | ICD-10-CM | POA: Diagnosis not present

## 2023-01-21 DIAGNOSIS — Z89421 Acquired absence of other right toe(s): Secondary | ICD-10-CM | POA: Diagnosis not present

## 2023-01-21 DIAGNOSIS — R41841 Cognitive communication deficit: Secondary | ICD-10-CM | POA: Diagnosis not present

## 2023-01-21 DIAGNOSIS — C61 Malignant neoplasm of prostate: Secondary | ICD-10-CM | POA: Diagnosis not present

## 2023-01-21 DIAGNOSIS — E46 Unspecified protein-calorie malnutrition: Secondary | ICD-10-CM | POA: Diagnosis not present

## 2023-01-21 DIAGNOSIS — D649 Anemia, unspecified: Secondary | ICD-10-CM | POA: Diagnosis not present

## 2023-01-22 DIAGNOSIS — Z89421 Acquired absence of other right toe(s): Secondary | ICD-10-CM | POA: Diagnosis not present

## 2023-01-22 DIAGNOSIS — R41841 Cognitive communication deficit: Secondary | ICD-10-CM | POA: Diagnosis not present

## 2023-01-22 DIAGNOSIS — C61 Malignant neoplasm of prostate: Secondary | ICD-10-CM | POA: Diagnosis not present

## 2023-01-22 DIAGNOSIS — D649 Anemia, unspecified: Secondary | ICD-10-CM | POA: Diagnosis not present

## 2023-01-22 DIAGNOSIS — I10 Essential (primary) hypertension: Secondary | ICD-10-CM | POA: Diagnosis not present

## 2023-01-22 DIAGNOSIS — E46 Unspecified protein-calorie malnutrition: Secondary | ICD-10-CM | POA: Diagnosis not present

## 2023-01-23 DIAGNOSIS — I10 Essential (primary) hypertension: Secondary | ICD-10-CM | POA: Diagnosis not present

## 2023-01-23 DIAGNOSIS — R41841 Cognitive communication deficit: Secondary | ICD-10-CM | POA: Diagnosis not present

## 2023-01-23 DIAGNOSIS — E46 Unspecified protein-calorie malnutrition: Secondary | ICD-10-CM | POA: Diagnosis not present

## 2023-01-23 DIAGNOSIS — Z89421 Acquired absence of other right toe(s): Secondary | ICD-10-CM | POA: Diagnosis not present

## 2023-01-23 DIAGNOSIS — D649 Anemia, unspecified: Secondary | ICD-10-CM | POA: Diagnosis not present

## 2023-01-23 DIAGNOSIS — C61 Malignant neoplasm of prostate: Secondary | ICD-10-CM | POA: Diagnosis not present

## 2023-01-24 DIAGNOSIS — I10 Essential (primary) hypertension: Secondary | ICD-10-CM | POA: Diagnosis not present

## 2023-01-24 DIAGNOSIS — E46 Unspecified protein-calorie malnutrition: Secondary | ICD-10-CM | POA: Diagnosis not present

## 2023-01-24 DIAGNOSIS — C61 Malignant neoplasm of prostate: Secondary | ICD-10-CM | POA: Diagnosis not present

## 2023-01-24 DIAGNOSIS — D649 Anemia, unspecified: Secondary | ICD-10-CM | POA: Diagnosis not present

## 2023-01-24 DIAGNOSIS — R41841 Cognitive communication deficit: Secondary | ICD-10-CM | POA: Diagnosis not present

## 2023-01-24 DIAGNOSIS — Z89421 Acquired absence of other right toe(s): Secondary | ICD-10-CM | POA: Diagnosis not present

## 2023-01-25 DIAGNOSIS — E46 Unspecified protein-calorie malnutrition: Secondary | ICD-10-CM | POA: Diagnosis not present

## 2023-01-25 DIAGNOSIS — C61 Malignant neoplasm of prostate: Secondary | ICD-10-CM | POA: Diagnosis not present

## 2023-01-25 DIAGNOSIS — R41841 Cognitive communication deficit: Secondary | ICD-10-CM | POA: Diagnosis not present

## 2023-01-25 DIAGNOSIS — D649 Anemia, unspecified: Secondary | ICD-10-CM | POA: Diagnosis not present

## 2023-01-25 DIAGNOSIS — I10 Essential (primary) hypertension: Secondary | ICD-10-CM | POA: Diagnosis not present

## 2023-01-25 DIAGNOSIS — Z89421 Acquired absence of other right toe(s): Secondary | ICD-10-CM | POA: Diagnosis not present

## 2023-01-26 DIAGNOSIS — I10 Essential (primary) hypertension: Secondary | ICD-10-CM | POA: Diagnosis not present

## 2023-01-26 DIAGNOSIS — R41841 Cognitive communication deficit: Secondary | ICD-10-CM | POA: Diagnosis not present

## 2023-01-26 DIAGNOSIS — D649 Anemia, unspecified: Secondary | ICD-10-CM | POA: Diagnosis not present

## 2023-01-26 DIAGNOSIS — Z89421 Acquired absence of other right toe(s): Secondary | ICD-10-CM | POA: Diagnosis not present

## 2023-01-26 DIAGNOSIS — C61 Malignant neoplasm of prostate: Secondary | ICD-10-CM | POA: Diagnosis not present

## 2023-01-26 DIAGNOSIS — E46 Unspecified protein-calorie malnutrition: Secondary | ICD-10-CM | POA: Diagnosis not present

## 2023-01-27 DIAGNOSIS — Z89421 Acquired absence of other right toe(s): Secondary | ICD-10-CM | POA: Diagnosis not present

## 2023-01-27 DIAGNOSIS — I10 Essential (primary) hypertension: Secondary | ICD-10-CM | POA: Diagnosis not present

## 2023-01-27 DIAGNOSIS — C61 Malignant neoplasm of prostate: Secondary | ICD-10-CM | POA: Diagnosis not present

## 2023-01-27 DIAGNOSIS — R41841 Cognitive communication deficit: Secondary | ICD-10-CM | POA: Diagnosis not present

## 2023-01-27 DIAGNOSIS — D649 Anemia, unspecified: Secondary | ICD-10-CM | POA: Diagnosis not present

## 2023-01-27 DIAGNOSIS — E46 Unspecified protein-calorie malnutrition: Secondary | ICD-10-CM | POA: Diagnosis not present

## 2023-01-28 ENCOUNTER — Encounter: Payer: Medicare Other | Admitting: Physical Medicine and Rehabilitation

## 2023-01-28 DIAGNOSIS — R41841 Cognitive communication deficit: Secondary | ICD-10-CM | POA: Diagnosis not present

## 2023-01-28 DIAGNOSIS — D649 Anemia, unspecified: Secondary | ICD-10-CM | POA: Diagnosis not present

## 2023-01-28 DIAGNOSIS — E46 Unspecified protein-calorie malnutrition: Secondary | ICD-10-CM | POA: Diagnosis not present

## 2023-01-28 DIAGNOSIS — C61 Malignant neoplasm of prostate: Secondary | ICD-10-CM | POA: Diagnosis not present

## 2023-01-28 DIAGNOSIS — Z89421 Acquired absence of other right toe(s): Secondary | ICD-10-CM | POA: Diagnosis not present

## 2023-01-28 DIAGNOSIS — I10 Essential (primary) hypertension: Secondary | ICD-10-CM | POA: Diagnosis not present

## 2023-01-29 DIAGNOSIS — R41841 Cognitive communication deficit: Secondary | ICD-10-CM | POA: Diagnosis not present

## 2023-01-29 DIAGNOSIS — D649 Anemia, unspecified: Secondary | ICD-10-CM | POA: Diagnosis not present

## 2023-01-29 DIAGNOSIS — E46 Unspecified protein-calorie malnutrition: Secondary | ICD-10-CM | POA: Diagnosis not present

## 2023-01-29 DIAGNOSIS — I10 Essential (primary) hypertension: Secondary | ICD-10-CM | POA: Diagnosis not present

## 2023-01-29 DIAGNOSIS — Z89421 Acquired absence of other right toe(s): Secondary | ICD-10-CM | POA: Diagnosis not present

## 2023-01-29 DIAGNOSIS — C61 Malignant neoplasm of prostate: Secondary | ICD-10-CM | POA: Diagnosis not present

## 2023-01-30 DIAGNOSIS — C61 Malignant neoplasm of prostate: Secondary | ICD-10-CM | POA: Diagnosis not present

## 2023-01-30 DIAGNOSIS — Z89421 Acquired absence of other right toe(s): Secondary | ICD-10-CM | POA: Diagnosis not present

## 2023-01-30 DIAGNOSIS — R41841 Cognitive communication deficit: Secondary | ICD-10-CM | POA: Diagnosis not present

## 2023-01-30 DIAGNOSIS — I10 Essential (primary) hypertension: Secondary | ICD-10-CM | POA: Diagnosis not present

## 2023-01-30 DIAGNOSIS — E46 Unspecified protein-calorie malnutrition: Secondary | ICD-10-CM | POA: Diagnosis not present

## 2023-01-30 DIAGNOSIS — D649 Anemia, unspecified: Secondary | ICD-10-CM | POA: Diagnosis not present

## 2023-01-31 DIAGNOSIS — D649 Anemia, unspecified: Secondary | ICD-10-CM | POA: Diagnosis not present

## 2023-01-31 DIAGNOSIS — Z89421 Acquired absence of other right toe(s): Secondary | ICD-10-CM | POA: Diagnosis not present

## 2023-01-31 DIAGNOSIS — I10 Essential (primary) hypertension: Secondary | ICD-10-CM | POA: Diagnosis not present

## 2023-01-31 DIAGNOSIS — R41841 Cognitive communication deficit: Secondary | ICD-10-CM | POA: Diagnosis not present

## 2023-01-31 DIAGNOSIS — C61 Malignant neoplasm of prostate: Secondary | ICD-10-CM | POA: Diagnosis not present

## 2023-01-31 DIAGNOSIS — E46 Unspecified protein-calorie malnutrition: Secondary | ICD-10-CM | POA: Diagnosis not present

## 2023-02-18 DEATH — deceased
